# Patient Record
Sex: Female | Born: 1935 | Race: White | Hispanic: No | Smoking: Never smoker
Health system: Southern US, Community
[De-identification: ages and names within clinical notes are randomized; demographics above are authoritative.]

## PROBLEM LIST (undated history)

## (undated) DIAGNOSIS — I4891 Unspecified atrial fibrillation: Secondary | ICD-10-CM

## (undated) DIAGNOSIS — Z8673 Personal history of transient ischemic attack (TIA), and cerebral infarction without residual deficits: Secondary | ICD-10-CM

## (undated) DIAGNOSIS — N950 Postmenopausal bleeding: Secondary | ICD-10-CM

## (undated) DIAGNOSIS — C541 Malignant neoplasm of endometrium: Secondary | ICD-10-CM

## (undated) DIAGNOSIS — R238 Other skin changes: Secondary | ICD-10-CM

## (undated) DIAGNOSIS — M1990 Unspecified osteoarthritis, unspecified site: Secondary | ICD-10-CM

## (undated) DIAGNOSIS — C801 Malignant (primary) neoplasm, unspecified: Secondary | ICD-10-CM

## (undated) DIAGNOSIS — I2510 Atherosclerotic heart disease of native coronary artery without angina pectoris: Secondary | ICD-10-CM

## (undated) DIAGNOSIS — Z973 Presence of spectacles and contact lenses: Secondary | ICD-10-CM

## (undated) DIAGNOSIS — Z8619 Personal history of other infectious and parasitic diseases: Secondary | ICD-10-CM

## (undated) DIAGNOSIS — K219 Gastro-esophageal reflux disease without esophagitis: Secondary | ICD-10-CM

## (undated) DIAGNOSIS — I10 Essential (primary) hypertension: Secondary | ICD-10-CM

## (undated) DIAGNOSIS — H3530 Unspecified macular degeneration: Secondary | ICD-10-CM

## (undated) DIAGNOSIS — I4719 Other supraventricular tachycardia: Secondary | ICD-10-CM

## (undated) DIAGNOSIS — Z853 Personal history of malignant neoplasm of breast: Secondary | ICD-10-CM

## (undated) DIAGNOSIS — R9389 Abnormal findings on diagnostic imaging of other specified body structures: Secondary | ICD-10-CM

## (undated) DIAGNOSIS — Z9889 Other specified postprocedural states: Secondary | ICD-10-CM

## (undated) DIAGNOSIS — W19XXXA Unspecified fall, initial encounter: Secondary | ICD-10-CM

## (undated) DIAGNOSIS — I471 Supraventricular tachycardia: Secondary | ICD-10-CM

## (undated) DIAGNOSIS — H409 Unspecified glaucoma: Secondary | ICD-10-CM

## (undated) DIAGNOSIS — R233 Spontaneous ecchymoses: Secondary | ICD-10-CM

## (undated) DIAGNOSIS — R112 Nausea with vomiting, unspecified: Secondary | ICD-10-CM

## (undated) DIAGNOSIS — E785 Hyperlipidemia, unspecified: Secondary | ICD-10-CM

## (undated) HISTORY — DX: Essential (primary) hypertension: I10

## (undated) HISTORY — PX: DILATION AND CURETTAGE, DIAGNOSTIC / THERAPEUTIC: SUR384

## (undated) HISTORY — PX: OTHER SURGICAL HISTORY: SHX169

## (undated) HISTORY — DX: Unspecified fall, initial encounter: W19.XXXA

---
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
## 1992-01-19 HISTORY — PX: CATARACT EXTRACTION W/ INTRAOCULAR LENS  IMPLANT, BILATERAL: SHX1307

## 1996-01-19 DIAGNOSIS — C801 Malignant (primary) neoplasm, unspecified: Secondary | ICD-10-CM

## 1996-01-19 HISTORY — DX: Malignant (primary) neoplasm, unspecified: C80.1

## 1996-01-19 HISTORY — PX: MASTECTOMY: SHX3

## 2000-01-19 HISTORY — PX: KNEE ARTHROSCOPY: SUR90

## 2004-05-14 ENCOUNTER — Ambulatory Visit: Payer: Self-pay | Admitting: Oncology

## 2005-05-17 ENCOUNTER — Ambulatory Visit: Payer: Self-pay | Admitting: Oncology

## 2006-05-25 ENCOUNTER — Ambulatory Visit: Payer: Self-pay | Admitting: Oncology

## 2013-10-15 DIAGNOSIS — M172 Bilateral post-traumatic osteoarthritis of knee: Secondary | ICD-10-CM

## 2013-10-15 HISTORY — DX: Bilateral post-traumatic osteoarthritis of knee: M17.2

## 2014-10-21 DIAGNOSIS — I479 Paroxysmal tachycardia, unspecified: Secondary | ICD-10-CM | POA: Diagnosis not present

## 2014-10-24 DIAGNOSIS — I10 Essential (primary) hypertension: Secondary | ICD-10-CM

## 2014-10-24 DIAGNOSIS — R42 Dizziness and giddiness: Secondary | ICD-10-CM | POA: Diagnosis not present

## 2014-10-24 DIAGNOSIS — E785 Hyperlipidemia, unspecified: Secondary | ICD-10-CM | POA: Diagnosis not present

## 2014-10-24 HISTORY — DX: Essential (primary) hypertension: I10

## 2014-11-06 DIAGNOSIS — R55 Syncope and collapse: Secondary | ICD-10-CM | POA: Diagnosis not present

## 2014-11-06 DIAGNOSIS — E785 Hyperlipidemia, unspecified: Secondary | ICD-10-CM | POA: Diagnosis not present

## 2014-11-06 DIAGNOSIS — R42 Dizziness and giddiness: Secondary | ICD-10-CM | POA: Diagnosis not present

## 2014-11-06 DIAGNOSIS — I10 Essential (primary) hypertension: Secondary | ICD-10-CM | POA: Diagnosis not present

## 2014-11-11 DIAGNOSIS — R5383 Other fatigue: Secondary | ICD-10-CM | POA: Diagnosis not present

## 2014-11-11 DIAGNOSIS — R69 Illness, unspecified: Secondary | ICD-10-CM | POA: Diagnosis not present

## 2014-11-11 DIAGNOSIS — R42 Dizziness and giddiness: Secondary | ICD-10-CM | POA: Diagnosis not present

## 2014-11-11 DIAGNOSIS — R5381 Other malaise: Secondary | ICD-10-CM | POA: Diagnosis not present

## 2014-11-13 DIAGNOSIS — I10 Essential (primary) hypertension: Secondary | ICD-10-CM | POA: Diagnosis not present

## 2014-11-13 DIAGNOSIS — E785 Hyperlipidemia, unspecified: Secondary | ICD-10-CM | POA: Diagnosis not present

## 2014-11-13 DIAGNOSIS — R42 Dizziness and giddiness: Secondary | ICD-10-CM | POA: Diagnosis not present

## 2014-11-20 DIAGNOSIS — R109 Unspecified abdominal pain: Secondary | ICD-10-CM | POA: Diagnosis not present

## 2014-11-20 DIAGNOSIS — R140 Abdominal distension (gaseous): Secondary | ICD-10-CM | POA: Diagnosis not present

## 2014-11-20 DIAGNOSIS — R5383 Other fatigue: Secondary | ICD-10-CM | POA: Diagnosis not present

## 2014-11-25 DIAGNOSIS — M1711 Unilateral primary osteoarthritis, right knee: Secondary | ICD-10-CM | POA: Diagnosis not present

## 2014-12-24 DIAGNOSIS — E785 Hyperlipidemia, unspecified: Secondary | ICD-10-CM | POA: Diagnosis not present

## 2014-12-24 DIAGNOSIS — R42 Dizziness and giddiness: Secondary | ICD-10-CM | POA: Diagnosis not present

## 2014-12-24 DIAGNOSIS — I10 Essential (primary) hypertension: Secondary | ICD-10-CM | POA: Diagnosis not present

## 2015-02-27 DIAGNOSIS — H26491 Other secondary cataract, right eye: Secondary | ICD-10-CM | POA: Diagnosis not present

## 2015-02-27 DIAGNOSIS — H524 Presbyopia: Secondary | ICD-10-CM | POA: Diagnosis not present

## 2015-02-27 DIAGNOSIS — H5213 Myopia, bilateral: Secondary | ICD-10-CM | POA: Diagnosis not present

## 2015-02-27 DIAGNOSIS — H401131 Primary open-angle glaucoma, bilateral, mild stage: Secondary | ICD-10-CM | POA: Diagnosis not present

## 2015-03-12 DIAGNOSIS — I471 Supraventricular tachycardia: Secondary | ICD-10-CM | POA: Diagnosis not present

## 2015-03-12 DIAGNOSIS — Z853 Personal history of malignant neoplasm of breast: Secondary | ICD-10-CM | POA: Diagnosis not present

## 2015-03-12 DIAGNOSIS — R000 Tachycardia, unspecified: Secondary | ICD-10-CM | POA: Diagnosis not present

## 2015-03-12 DIAGNOSIS — Z7982 Long term (current) use of aspirin: Secondary | ICD-10-CM | POA: Diagnosis not present

## 2015-03-12 DIAGNOSIS — S060X0A Concussion without loss of consciousness, initial encounter: Secondary | ICD-10-CM | POA: Diagnosis not present

## 2015-03-12 DIAGNOSIS — W1839XA Other fall on same level, initial encounter: Secondary | ICD-10-CM | POA: Diagnosis not present

## 2015-03-12 DIAGNOSIS — R69 Illness, unspecified: Secondary | ICD-10-CM | POA: Diagnosis not present

## 2015-03-12 DIAGNOSIS — R05 Cough: Secondary | ICD-10-CM | POA: Diagnosis not present

## 2015-03-12 DIAGNOSIS — E860 Dehydration: Secondary | ICD-10-CM | POA: Diagnosis not present

## 2015-03-12 DIAGNOSIS — S0003XA Contusion of scalp, initial encounter: Secondary | ICD-10-CM | POA: Diagnosis not present

## 2015-03-12 DIAGNOSIS — Y9389 Activity, other specified: Secondary | ICD-10-CM | POA: Diagnosis not present

## 2015-03-12 DIAGNOSIS — E7800 Pure hypercholesterolemia, unspecified: Secondary | ICD-10-CM | POA: Diagnosis not present

## 2015-03-12 DIAGNOSIS — I160 Hypertensive urgency: Secondary | ICD-10-CM | POA: Diagnosis not present

## 2015-03-12 DIAGNOSIS — I4891 Unspecified atrial fibrillation: Secondary | ICD-10-CM | POA: Diagnosis not present

## 2015-03-12 DIAGNOSIS — I10 Essential (primary) hypertension: Secondary | ICD-10-CM | POA: Diagnosis not present

## 2015-03-12 DIAGNOSIS — I621 Nontraumatic extradural hemorrhage: Secondary | ICD-10-CM | POA: Diagnosis not present

## 2015-03-12 DIAGNOSIS — R0602 Shortness of breath: Secondary | ICD-10-CM | POA: Diagnosis not present

## 2015-03-12 DIAGNOSIS — S0990XA Unspecified injury of head, initial encounter: Secondary | ICD-10-CM | POA: Diagnosis not present

## 2015-03-12 DIAGNOSIS — I480 Paroxysmal atrial fibrillation: Secondary | ICD-10-CM | POA: Diagnosis not present

## 2015-03-12 DIAGNOSIS — R509 Fever, unspecified: Secondary | ICD-10-CM | POA: Diagnosis not present

## 2015-03-13 DIAGNOSIS — S060X0A Concussion without loss of consciousness, initial encounter: Secondary | ICD-10-CM | POA: Diagnosis not present

## 2015-03-13 DIAGNOSIS — R509 Fever, unspecified: Secondary | ICD-10-CM | POA: Diagnosis not present

## 2015-03-13 DIAGNOSIS — I471 Supraventricular tachycardia: Secondary | ICD-10-CM | POA: Diagnosis not present

## 2015-03-14 DIAGNOSIS — R0602 Shortness of breath: Secondary | ICD-10-CM | POA: Diagnosis not present

## 2015-03-14 DIAGNOSIS — I471 Supraventricular tachycardia: Secondary | ICD-10-CM | POA: Diagnosis not present

## 2015-03-14 DIAGNOSIS — I4891 Unspecified atrial fibrillation: Secondary | ICD-10-CM | POA: Diagnosis not present

## 2015-03-14 DIAGNOSIS — I10 Essential (primary) hypertension: Secondary | ICD-10-CM | POA: Diagnosis not present

## 2015-03-14 DIAGNOSIS — R000 Tachycardia, unspecified: Secondary | ICD-10-CM | POA: Diagnosis not present

## 2015-03-14 DIAGNOSIS — Z853 Personal history of malignant neoplasm of breast: Secondary | ICD-10-CM | POA: Diagnosis not present

## 2015-03-14 DIAGNOSIS — E860 Dehydration: Secondary | ICD-10-CM | POA: Diagnosis not present

## 2015-03-14 DIAGNOSIS — W1839XA Other fall on same level, initial encounter: Secondary | ICD-10-CM | POA: Diagnosis not present

## 2015-03-14 DIAGNOSIS — Y9389 Activity, other specified: Secondary | ICD-10-CM | POA: Diagnosis not present

## 2015-03-14 DIAGNOSIS — R69 Illness, unspecified: Secondary | ICD-10-CM | POA: Diagnosis not present

## 2015-03-14 DIAGNOSIS — S0003XA Contusion of scalp, initial encounter: Secondary | ICD-10-CM | POA: Diagnosis not present

## 2015-03-14 DIAGNOSIS — S060X0A Concussion without loss of consciousness, initial encounter: Secondary | ICD-10-CM | POA: Diagnosis not present

## 2015-03-15 DIAGNOSIS — R05 Cough: Secondary | ICD-10-CM | POA: Diagnosis not present

## 2015-03-15 DIAGNOSIS — I471 Supraventricular tachycardia: Secondary | ICD-10-CM | POA: Diagnosis not present

## 2015-03-21 DIAGNOSIS — L538 Other specified erythematous conditions: Secondary | ICD-10-CM | POA: Diagnosis not present

## 2015-03-21 DIAGNOSIS — R928 Other abnormal and inconclusive findings on diagnostic imaging of breast: Secondary | ICD-10-CM | POA: Diagnosis not present

## 2015-03-21 DIAGNOSIS — N63 Unspecified lump in breast: Secondary | ICD-10-CM | POA: Diagnosis not present

## 2015-03-21 DIAGNOSIS — N6489 Other specified disorders of breast: Secondary | ICD-10-CM | POA: Diagnosis not present

## 2015-03-25 DIAGNOSIS — T148 Other injury of unspecified body region: Secondary | ICD-10-CM | POA: Diagnosis not present

## 2015-03-25 DIAGNOSIS — Z1389 Encounter for screening for other disorder: Secondary | ICD-10-CM | POA: Diagnosis not present

## 2015-03-25 DIAGNOSIS — Z9181 History of falling: Secondary | ICD-10-CM | POA: Diagnosis not present

## 2015-03-25 DIAGNOSIS — I4891 Unspecified atrial fibrillation: Secondary | ICD-10-CM | POA: Diagnosis not present

## 2015-03-25 DIAGNOSIS — S060X9A Concussion with loss of consciousness of unspecified duration, initial encounter: Secondary | ICD-10-CM | POA: Diagnosis not present

## 2015-03-25 DIAGNOSIS — J310 Chronic rhinitis: Secondary | ICD-10-CM | POA: Diagnosis not present

## 2015-04-01 DIAGNOSIS — I10 Essential (primary) hypertension: Secondary | ICD-10-CM | POA: Diagnosis not present

## 2015-04-01 DIAGNOSIS — E785 Hyperlipidemia, unspecified: Secondary | ICD-10-CM | POA: Diagnosis not present

## 2015-04-02 DIAGNOSIS — E785 Hyperlipidemia, unspecified: Secondary | ICD-10-CM | POA: Diagnosis not present

## 2015-04-02 DIAGNOSIS — I10 Essential (primary) hypertension: Secondary | ICD-10-CM | POA: Diagnosis not present

## 2015-04-14 DIAGNOSIS — M1731 Unilateral post-traumatic osteoarthritis, right knee: Secondary | ICD-10-CM | POA: Diagnosis not present

## 2015-04-23 DIAGNOSIS — Z853 Personal history of malignant neoplasm of breast: Secondary | ICD-10-CM

## 2015-04-23 DIAGNOSIS — N189 Chronic kidney disease, unspecified: Secondary | ICD-10-CM | POA: Diagnosis not present

## 2015-04-23 DIAGNOSIS — I10 Essential (primary) hypertension: Secondary | ICD-10-CM

## 2015-04-28 DIAGNOSIS — I10 Essential (primary) hypertension: Secondary | ICD-10-CM | POA: Diagnosis not present

## 2015-04-28 DIAGNOSIS — M810 Age-related osteoporosis without current pathological fracture: Secondary | ICD-10-CM | POA: Diagnosis not present

## 2015-04-28 DIAGNOSIS — I471 Supraventricular tachycardia: Secondary | ICD-10-CM | POA: Diagnosis not present

## 2015-04-28 DIAGNOSIS — K219 Gastro-esophageal reflux disease without esophagitis: Secondary | ICD-10-CM | POA: Diagnosis not present

## 2015-04-28 DIAGNOSIS — I4891 Unspecified atrial fibrillation: Secondary | ICD-10-CM | POA: Diagnosis not present

## 2015-04-28 DIAGNOSIS — E785 Hyperlipidemia, unspecified: Secondary | ICD-10-CM | POA: Diagnosis not present

## 2015-07-28 DIAGNOSIS — Z1231 Encounter for screening mammogram for malignant neoplasm of breast: Secondary | ICD-10-CM | POA: Diagnosis not present

## 2015-08-04 DIAGNOSIS — Z853 Personal history of malignant neoplasm of breast: Secondary | ICD-10-CM | POA: Diagnosis not present

## 2015-08-04 DIAGNOSIS — I10 Essential (primary) hypertension: Secondary | ICD-10-CM | POA: Diagnosis not present

## 2015-08-07 DIAGNOSIS — G8929 Other chronic pain: Secondary | ICD-10-CM | POA: Diagnosis not present

## 2015-08-07 DIAGNOSIS — M25561 Pain in right knee: Secondary | ICD-10-CM | POA: Diagnosis not present

## 2015-08-07 DIAGNOSIS — M1711 Unilateral primary osteoarthritis, right knee: Secondary | ICD-10-CM | POA: Diagnosis not present

## 2015-08-28 DIAGNOSIS — I10 Essential (primary) hypertension: Secondary | ICD-10-CM | POA: Diagnosis not present

## 2015-08-28 DIAGNOSIS — Z0000 Encounter for general adult medical examination without abnormal findings: Secondary | ICD-10-CM | POA: Diagnosis not present

## 2015-08-28 DIAGNOSIS — K219 Gastro-esophageal reflux disease without esophagitis: Secondary | ICD-10-CM | POA: Diagnosis not present

## 2015-08-28 DIAGNOSIS — D539 Nutritional anemia, unspecified: Secondary | ICD-10-CM | POA: Diagnosis not present

## 2015-08-28 DIAGNOSIS — R5381 Other malaise: Secondary | ICD-10-CM | POA: Diagnosis not present

## 2015-09-17 DIAGNOSIS — H26491 Other secondary cataract, right eye: Secondary | ICD-10-CM | POA: Diagnosis not present

## 2015-09-17 DIAGNOSIS — H5213 Myopia, bilateral: Secondary | ICD-10-CM | POA: Diagnosis not present

## 2015-09-17 DIAGNOSIS — H401131 Primary open-angle glaucoma, bilateral, mild stage: Secondary | ICD-10-CM | POA: Diagnosis not present

## 2015-09-17 DIAGNOSIS — H524 Presbyopia: Secondary | ICD-10-CM | POA: Diagnosis not present

## 2015-10-07 DIAGNOSIS — N3001 Acute cystitis with hematuria: Secondary | ICD-10-CM | POA: Diagnosis not present

## 2015-10-07 DIAGNOSIS — N3091 Cystitis, unspecified with hematuria: Secondary | ICD-10-CM | POA: Diagnosis not present

## 2015-10-14 DIAGNOSIS — N898 Other specified noninflammatory disorders of vagina: Secondary | ICD-10-CM | POA: Diagnosis not present

## 2015-10-14 DIAGNOSIS — Z23 Encounter for immunization: Secondary | ICD-10-CM | POA: Diagnosis not present

## 2015-10-14 DIAGNOSIS — M545 Low back pain: Secondary | ICD-10-CM | POA: Diagnosis not present

## 2015-10-20 DIAGNOSIS — N950 Postmenopausal bleeding: Secondary | ICD-10-CM | POA: Diagnosis not present

## 2015-10-24 DIAGNOSIS — R938 Abnormal findings on diagnostic imaging of other specified body structures: Secondary | ICD-10-CM | POA: Diagnosis not present

## 2015-10-24 DIAGNOSIS — N950 Postmenopausal bleeding: Secondary | ICD-10-CM | POA: Diagnosis not present

## 2015-10-28 DIAGNOSIS — I2510 Atherosclerotic heart disease of native coronary artery without angina pectoris: Secondary | ICD-10-CM | POA: Diagnosis not present

## 2015-10-28 DIAGNOSIS — Z01810 Encounter for preprocedural cardiovascular examination: Secondary | ICD-10-CM | POA: Diagnosis not present

## 2015-10-28 DIAGNOSIS — E785 Hyperlipidemia, unspecified: Secondary | ICD-10-CM | POA: Diagnosis not present

## 2015-10-28 DIAGNOSIS — I471 Supraventricular tachycardia: Secondary | ICD-10-CM | POA: Diagnosis not present

## 2015-10-28 DIAGNOSIS — I10 Essential (primary) hypertension: Secondary | ICD-10-CM | POA: Diagnosis not present

## 2015-10-28 DIAGNOSIS — R42 Dizziness and giddiness: Secondary | ICD-10-CM | POA: Diagnosis not present

## 2015-11-10 ENCOUNTER — Encounter: Payer: Self-pay | Admitting: Gynecologic Oncology

## 2015-11-10 ENCOUNTER — Ambulatory Visit: Payer: Medicare HMO | Attending: Gynecologic Oncology | Admitting: Gynecologic Oncology

## 2015-11-10 DIAGNOSIS — R938 Abnormal findings on diagnostic imaging of other specified body structures: Secondary | ICD-10-CM

## 2015-11-10 DIAGNOSIS — N950 Postmenopausal bleeding: Secondary | ICD-10-CM | POA: Insufficient documentation

## 2015-11-10 DIAGNOSIS — Z79899 Other long term (current) drug therapy: Secondary | ICD-10-CM | POA: Diagnosis not present

## 2015-11-10 DIAGNOSIS — N895 Stricture and atresia of vagina: Secondary | ICD-10-CM | POA: Diagnosis not present

## 2015-11-10 DIAGNOSIS — R9389 Abnormal findings on diagnostic imaging of other specified body structures: Secondary | ICD-10-CM | POA: Insufficient documentation

## 2015-11-10 DIAGNOSIS — Z7902 Long term (current) use of antithrombotics/antiplatelets: Secondary | ICD-10-CM | POA: Insufficient documentation

## 2015-11-10 HISTORY — DX: Postmenopausal bleeding: N95.0

## 2015-11-10 NOTE — Patient Instructions (Signed)
Plan to have a dilation and curettage of the uterus on November 20, 2015 at the [facility]  You will receive a phone call from the pre-surgical RN several days before your procedure.    STOP TAKING YOUR PLAVIX NOW.  Please call our office for any questions or concerns.

## 2015-11-10 NOTE — Progress Notes (Signed)
Consult Note: Gyn-Onc  Consult was requested by Dr. Berenice Primas for the evaluation of Sheri Hensley 82 y.o. female  CC:  Chief Complaint  Patient presents with  . PMB    New Consultation  . Thickened stripe  . Vaginal stenosis    Assessment/Plan:  Sheri Hensley  is a 82 y.o.  year old with a thickened endometrium and post-menopausal bleeding with intolerance to exam in the office.  I believe she is a good candidate for D&C in the operating room. MAC sedation will likely be adequate and therefore I do not think that she will require preop cardiac clearance prior to this procedure. I discussed that based on her exam findings there is a 20% risk for underlying malignancy.  If this is diagnosed on D&C she will require either a hysterectomy or conservative therapy (progestins/radiation) pending her preference.  I discussed the risks of D&C including perforation, infection, bleeding. I discussed bleeding risk is higher because she is on Plavix.  I recommend she hold the plavix prior to this procedure.   HPI: Sheri Hensley is a 82 year old woman (G1P1) who is seen in consultation at the request of Dr Berenice Primas due to post-menopausal bleeding.  The patient has a history of postmenopausal bleeding since early September 2017. It was initially light spotting, then became slightly more persistent over time.  She was seen by Dr Berenice Primas on 10/20/15 where a TVUS was performed and demonstrated a uterus measuring 6.25x5.2x4.19cm but with a thickened endometrium of 2cm. The notes document that an office biopsy was not possible due to narrowed vagina, an Dr Berenice Primas did not think she would be able to accomplish a D&C in the operating room.  The patient is relatively healthy for her advanced age. She has a history of an arrythmia for which she takes amiodarone. She also takes Plavix for a presumed TIA. She otherwise has a medical history significant for a prior  appendectomy and 1 prior vaginal delivery.    Current Meds:  Outpatient Encounter Prescriptions as of 11/10/2015  Medication Sig  . ALPRAZolam (XANAX) 0.25 MG tablet Take 0.25 mg by mouth daily as needed for anxiety.  Marland Kitchen amiodarone (PACERONE) 200 MG tablet Take 100 mg by mouth daily.  . brimonidine (ALPHAGAN P) 0.1 % SOLN   . clopidogrel (PLAVIX) 75 MG tablet Take 75 mg by mouth.  . cyanocobalamin (V-R VITAMIN B-12) 500 MCG tablet Take by mouth.  Marland Kitchen lisinopril (PRINIVIL,ZESTRIL) 10 MG tablet Take 10 mg by mouth.  . Melatonin 10 MG CAPS Take by mouth.  . metoprolol tartrate (LOPRESSOR) 25 MG tablet Take 25 mg by mouth.  . metoprolol tartrate (LOPRESSOR) 25 MG tablet Take 25 mg by mouth.  . Multiple Vitamins-Minerals (CENTRUM VITAMINTS PO) Take 1 tablet by mouth every morning.  Marland Kitchen omeprazole (PRILOSEC) 20 MG capsule Take 20 mg by mouth every morning.  . simvastatin (ZOCOR) 20 MG tablet Take 20 mg by mouth.   No facility-administered encounter medications on file as of 11/10/2015.     Allergy:  Allergies  Allergen Reactions  . Metronidazole Rash    Social Hx:   Social History   Social History  . Marital status: Widowed    Spouse name: N/A  . Number of children: N/A  . Years of education: N/A   Occupational History  . Not on file.   Social History Main Topics  . Smoking status: Never Smoker  . Smokeless tobacco: Never Used  . Alcohol use Yes  Comment: occ  . Drug use: No  . Sexual activity: Not on file   Other Topics Concern  . Not on file   Social History Narrative  . No narrative on file    Past Surgical Hx:  Past Surgical History:  Procedure Laterality Date  . APPENDECTOMY  1954    Past Medical Hx:  Past Medical History:  Diagnosis Date  . Allergy   . Arthritis   . Breast cancer ([place]) 1998   Left breast removed  . Cataract 1994   Both eyes  . Hypertension     Past Gynecological History:  G1 (SVD) No LMP recorded.  Family Hx:  Family History   Problem Relation Age of Onset  . Hypertension Mother   . Hypertension Father     Review of Systems:  Constitutional  Feels well,    ENT Normal appearing ears and nares bilaterally Skin/Breast  No rash, sores, jaundice, itching, dryness Cardiovascular  No chest pain, shortness of breath, or edema  Pulmonary  No cough or wheeze.  Gastro Intestinal  No nausea, vomitting, or diarrhoea. No bright red blood per rectum, no abdominal pain, change in bowel movement, or constipation.  Genito Urinary  No frequency, urgency, dysuria, + postmenopausal bleeding Musculo Skeletal  No myalgia, arthralgia, joint swelling or pain  Neurologic  No weakness, numbness, change in gait,  Psychology  No depression, anxiety, insomnia.   Vitals:  Blood pressure (!) 182/80, pulse 61, temperature 97.2 F (36.2 C), temperature source Oral, resp. rate 18, height 5\' 3"  (1.6 m), weight 136 lb 1.6 oz (61.7 kg), SpO2 100 %.  Physical Exam: WD in NAD Neck  Supple NROM, without any enlargements.  Lymph Node Survey No cervical supraclavicular or inguinal adenopathy Cardiovascular  Pulse normal rate, regularity and rhythm. S1 and S2 normal.  Lungs  Clear to auscultation bilateraly, without wheezes/crackles/rhonchi. Good air movement.  Skin  No rash/lesions/breakdown  Psychiatry  Alert and oriented to person, place, and time  Abdomen  Normoactive bowel sounds, abdomen soft, non-tender and thin without evidence of hernia.  Back No CVA tenderness Genito Urinary  Vulva/vagina: Normal external female genitalia.  No lesions. No discharge or bleeding.  Bladder/urethra:  No lesions or masses, well supported bladder  Vagina: narrow but permits pederson speculum and a normal appearing cervix is seen clearly. The vaginal walls are smooth and atrophic and free of lesions.  Cervix: Normal appearing, no lesions. Stenotic - unsuccessful attempt at office biopsy  Uterus: Small, mobile, no parametrial involvement or  nodularity.  Adnexa: no palpable masses. Rectal  deferred Extremities  No bilateral cyanosis, clubbing or edema.   Sheri Eva, MD  11/10/2015, 2:29 PM

## 2015-11-11 DIAGNOSIS — E784 Other hyperlipidemia: Secondary | ICD-10-CM | POA: Diagnosis not present

## 2015-11-11 DIAGNOSIS — I2510 Atherosclerotic heart disease of native coronary artery without angina pectoris: Secondary | ICD-10-CM | POA: Diagnosis not present

## 2015-11-11 DIAGNOSIS — Z79899 Other long term (current) drug therapy: Secondary | ICD-10-CM | POA: Diagnosis not present

## 2015-11-17 ENCOUNTER — Encounter (HOSPITAL_BASED_OUTPATIENT_CLINIC_OR_DEPARTMENT_OTHER): Payer: Self-pay | Admitting: *Deleted

## 2015-11-18 ENCOUNTER — Encounter (HOSPITAL_BASED_OUTPATIENT_CLINIC_OR_DEPARTMENT_OTHER): Payer: Self-pay | Admitting: *Deleted

## 2015-11-18 NOTE — Progress Notes (Addendum)
NPO AFTER MN.  ARRIVE AT 0930.  NEEDS ISTAT .  CURRENT EKG TO BE FAXED FROM DR Salley Scarlet ([facility]).  WILL TAKE AMIORODONE, METOPROLOL, AND PRILOSEC AM DOS W/ SIPS OF WATER.   ADDENDIUM:  EKG FROM [facility] DONE FEB 2017.  POSSIBLE REPEAT EKG, ASK MDA DOS.

## 2015-11-20 ENCOUNTER — Other Ambulatory Visit: Payer: Self-pay

## 2015-11-20 ENCOUNTER — Ambulatory Visit (HOSPITAL_BASED_OUTPATIENT_CLINIC_OR_DEPARTMENT_OTHER)
Admission: RE | Admit: 2015-11-20 | Discharge: 2015-11-20 | Disposition: A | Discharge status: Home / Self Care | Payer: Medicare HMO | Source: Ambulatory Visit | Attending: Gynecologic Oncology | Admitting: Gynecologic Oncology

## 2015-11-20 ENCOUNTER — Encounter (HOSPITAL_BASED_OUTPATIENT_CLINIC_OR_DEPARTMENT_OTHER)
Admission: RE | Disposition: A | Discharge status: Home / Self Care | Payer: Self-pay | Source: Ambulatory Visit | Attending: Gynecologic Oncology

## 2015-11-20 ENCOUNTER — Ambulatory Visit (HOSPITAL_BASED_OUTPATIENT_CLINIC_OR_DEPARTMENT_OTHER): Payer: Medicare HMO | Admitting: Anesthesiology

## 2015-11-20 ENCOUNTER — Encounter (HOSPITAL_BASED_OUTPATIENT_CLINIC_OR_DEPARTMENT_OTHER): Payer: Self-pay | Admitting: *Deleted

## 2015-11-20 DIAGNOSIS — R938 Abnormal findings on diagnostic imaging of other specified body structures: Secondary | ICD-10-CM

## 2015-11-20 DIAGNOSIS — N950 Postmenopausal bleeding: Secondary | ICD-10-CM | POA: Insufficient documentation

## 2015-11-20 DIAGNOSIS — R9389 Abnormal findings on diagnostic imaging of other specified body structures: Secondary | ICD-10-CM | POA: Diagnosis present

## 2015-11-20 DIAGNOSIS — I2510 Atherosclerotic heart disease of native coronary artery without angina pectoris: Secondary | ICD-10-CM | POA: Insufficient documentation

## 2015-11-20 DIAGNOSIS — Z853 Personal history of malignant neoplasm of breast: Secondary | ICD-10-CM | POA: Diagnosis not present

## 2015-11-20 DIAGNOSIS — I10 Essential (primary) hypertension: Secondary | ICD-10-CM | POA: Diagnosis not present

## 2015-11-20 DIAGNOSIS — M1990 Unspecified osteoarthritis, unspecified site: Secondary | ICD-10-CM | POA: Diagnosis not present

## 2015-11-20 DIAGNOSIS — Z79899 Other long term (current) drug therapy: Secondary | ICD-10-CM | POA: Diagnosis not present

## 2015-11-20 DIAGNOSIS — Z7902 Long term (current) use of antithrombotics/antiplatelets: Secondary | ICD-10-CM | POA: Insufficient documentation

## 2015-11-20 DIAGNOSIS — N895 Stricture and atresia of vagina: Secondary | ICD-10-CM | POA: Insufficient documentation

## 2015-11-20 DIAGNOSIS — K219 Gastro-esophageal reflux disease without esophagitis: Secondary | ICD-10-CM | POA: Insufficient documentation

## 2015-11-20 DIAGNOSIS — C541 Malignant neoplasm of endometrium: Secondary | ICD-10-CM | POA: Diagnosis not present

## 2015-11-20 HISTORY — DX: Abnormal findings on diagnostic imaging of other specified body structures: R93.89

## 2015-11-20 HISTORY — DX: Personal history of malignant neoplasm of breast: Z85.3

## 2015-11-20 HISTORY — DX: Personal history of other infectious and parasitic diseases: Z86.19

## 2015-11-20 HISTORY — DX: Atherosclerotic heart disease of native coronary artery without angina pectoris: I25.10

## 2015-11-20 HISTORY — DX: Other supraventricular tachycardia: I47.19

## 2015-11-20 HISTORY — PX: DILATION AND CURETTAGE OF UTERUS: SHX78

## 2015-11-20 HISTORY — DX: Postmenopausal bleeding: N95.0

## 2015-11-20 HISTORY — DX: Hyperlipidemia, unspecified: E78.5

## 2015-11-20 HISTORY — DX: Presence of spectacles and contact lenses: Z97.3

## 2015-11-20 HISTORY — DX: Unspecified osteoarthritis, unspecified site: M19.90

## 2015-11-20 HISTORY — DX: Personal history of transient ischemic attack (TIA), and cerebral infarction without residual deficits: Z86.73

## 2015-11-20 HISTORY — DX: Gastro-esophageal reflux disease without esophagitis: K21.9

## 2015-11-20 HISTORY — DX: Supraventricular tachycardia: I47.1

## 2015-11-20 HISTORY — DX: Unspecified macular degeneration: H35.30

## 2015-11-20 HISTORY — DX: Unspecified glaucoma: H40.9

## 2015-11-20 LAB — POCT I-STAT, CHEM 8
BUN: 18 mg/dL (ref 6–20)
Calcium, Ion: 1.26 mmol/L (ref 1.15–1.40)
Chloride: 99 mmol/L — ABNORMAL LOW (ref 101–111)
Creatinine, Ser: 1 mg/dL (ref 0.44–1.00)
Glucose, Bld: 102 mg/dL — ABNORMAL HIGH (ref 65–99)
HEMATOCRIT: 39 % (ref 36.0–46.0)
HEMOGLOBIN: 13.3 g/dL (ref 12.0–15.0)
Potassium: 4.4 mmol/L (ref 3.5–5.1)
SODIUM: 140 mmol/L (ref 135–145)
TCO2: 27 mmol/L (ref 0–100)

## 2015-11-20 SURGERY — DILATION AND CURETTAGE
Anesthesia: General | Site: Uterus

## 2015-11-20 MED ORDER — PROPOFOL 10 MG/ML IV BOLUS
INTRAVENOUS | Status: DC | PRN
  Administered 2015-11-20: 30 mg via INTRAVENOUS
  Administered 2015-11-20: 70 mg via INTRAVENOUS
  Administered 2015-11-20: 50 mg via INTRAVENOUS

## 2015-11-20 MED ORDER — LIDOCAINE HCL (CARDIAC) 20 MG/ML IV SOLN
INTRAVENOUS | Status: DC | PRN
  Administered 2015-11-20: 60 mg via INTRAVENOUS

## 2015-11-20 MED ORDER — LACTATED RINGERS IV SOLN
INTRAVENOUS | Status: DC
  Administered 2015-11-20: 10:00:00 via INTRAVENOUS
  Filled 2015-11-20: qty 1000

## 2015-11-20 MED ORDER — FENTANYL CITRATE (PF) 100 MCG/2ML IJ SOLN
INTRAMUSCULAR | Status: DC | PRN
  Administered 2015-11-20 (×2): 25 ug via INTRAVENOUS

## 2015-11-20 MED ORDER — LIDOCAINE 2% (20 MG/ML) 5 ML SYRINGE
INTRAMUSCULAR | Status: AC
  Filled 2015-11-20: qty 5

## 2015-11-20 MED ORDER — PROPOFOL 10 MG/ML IV BOLUS
INTRAVENOUS | Status: AC
  Filled 2015-11-20: qty 20

## 2015-11-20 MED ORDER — LIDOCAINE-EPINEPHRINE 1 %-1:100000 IJ SOLN
INTRAMUSCULAR | Status: DC | PRN
  Administered 2015-11-20: 8 mL

## 2015-11-20 MED ORDER — ONDANSETRON HCL 4 MG/2ML IJ SOLN
INTRAMUSCULAR | Status: DC | PRN
  Administered 2015-11-20: 4 mg via INTRAVENOUS

## 2015-11-20 MED ORDER — 0.9 % SODIUM CHLORIDE (POUR BTL) OPTIME
TOPICAL | Status: DC | PRN
  Administered 2015-11-20: 500 mL

## 2015-11-20 MED ORDER — FENTANYL CITRATE (PF) 100 MCG/2ML IJ SOLN
INTRAMUSCULAR | Status: AC
  Filled 2015-11-20: qty 2

## 2015-11-20 MED ORDER — LIDOCAINE-EPINEPHRINE 1 %-1:100000 IJ SOLN
INTRAMUSCULAR | Status: AC
  Filled 2015-11-20: qty 1

## 2015-11-20 MED ORDER — LIDOCAINE-EPINEPHRINE (PF) 1 %-1:200000 IJ SOLN
INTRAMUSCULAR | Status: AC
  Filled 2015-11-20: qty 30

## 2015-11-20 SURGICAL SUPPLY — 24 items
CATH ROBINSON RED A/P 16FR (CATHETERS) ×2 IMPLANT
COVER TABLE BACK 60X90 (DRAPES) ×2 IMPLANT
DILATOR CANAL MILEX (MISCELLANEOUS) ×2 IMPLANT
DRAPE SHEET LG 3/4 BI-LAMINATE (DRAPES) ×2 IMPLANT
DRAPE UNDERBUTTOCKS STRL (DRAPE) ×2 IMPLANT
DRSG TELFA 3X8 NADH (GAUZE/BANDAGES/DRESSINGS) ×2 IMPLANT
GLOVE BIO SURGEON STRL SZ 6 (GLOVE) ×4 IMPLANT
GOWN STRL REUS W/ TWL LRG LVL3 (GOWN DISPOSABLE) ×1 IMPLANT
GOWN STRL REUS W/TWL LRG LVL3 (GOWN DISPOSABLE) ×1
GOWN STRL REUS W/TWL LRG LVL4 (GOWN DISPOSABLE) ×2 IMPLANT
KIT ROOM TURNOVER WOR (KITS) ×2 IMPLANT
LEGGING LITHOTOMY PAIR STRL (DRAPES) ×2 IMPLANT
MANIFOLD NEPTUNE II (INSTRUMENTS) IMPLANT
NEEDLE SPNL 22GX3.5 QUINCKE BK (NEEDLE) IMPLANT
PACK BASIN DAY SURGERY FS (CUSTOM PROCEDURE TRAY) ×2 IMPLANT
PAD OB MATERNITY 4.3X12.25 (PERSONAL CARE ITEMS) ×2 IMPLANT
PAD PREP 24X48 CUFFED NSTRL (MISCELLANEOUS) ×2 IMPLANT
SUT VIC AB 0 CT1 36 (SUTURE) ×2 IMPLANT
SUT VIC AB 2-0 UR6 27 (SUTURE) IMPLANT
SYR CONTROL 10ML LL (SYRINGE) ×2 IMPLANT
TOWEL OR 17X24 6PK STRL BLUE (TOWEL DISPOSABLE) ×2 IMPLANT
TRAY DSU PREP LF (CUSTOM PROCEDURE TRAY) ×2 IMPLANT
TUBE CONNECTING 12X1/4 (SUCTIONS) IMPLANT
WATER STERILE IRR 500ML POUR (IV SOLUTION) ×2 IMPLANT

## 2015-11-20 NOTE — Anesthesia Preprocedure Evaluation (Addendum)
Anesthesia Evaluation  Patient identified by MRN, date of birth, ID band Patient awake    Reviewed: reviewed documented beta blocker date and time   Airway Mallampati: II  TM Distance: >3 FB     Dental  (+) Implants, Teeth Intact, Dental Advisory Given,    Pulmonary neg pulmonary ROS,    breath sounds clear to auscultation       Cardiovascular hypertension, Pt. on medications and Pt. on home beta blockers + CAD   Rhythm:Regular Rate:Normal     Neuro/Psych negative neurological ROS     GI/Hepatic Neg liver ROS, GERD  Medicated and Controlled,  Endo/Other  negative endocrine ROS  Renal/GU negative Renal ROS     Musculoskeletal  (+) Arthritis , Osteoarthritis,    Abdominal   Peds  Hematology negative hematology ROS (+)   Anesthesia Other Findings   Reproductive/Obstetrics                           Lab Results  Component Value Date   HGB 13.3 11/20/2015   HCT 39.0 11/20/2015   Lab Results  Component Value Date   CREATININE 1.00 11/20/2015   BUN 18 11/20/2015   NA 140 11/20/2015   K 4.4 11/20/2015   CL 99 (L) 11/20/2015   No results found for: INR, PROTIME  EKG: sinus bradycardia.  Anesthesia Physical Anesthesia Plan  ASA: II  Anesthesia Plan: General   Post-op Pain Management:    Induction: Intravenous  Airway Management Planned: LMA  Additional Equipment:   Intra-op Plan:   Post-operative Plan: Extubation in OR  Informed Consent: I have reviewed the patients History and Physical, chart, labs and discussed the procedure including the risks, benefits and alternatives for the proposed anesthesia with the patient or authorized representative who has indicated his/her understanding and acceptance.   Dental advisory given  Plan Discussed with: CRNA  Anesthesia Plan Comments:         Anesthesia Quick Evaluation

## 2015-11-20 NOTE — Transfer of Care (Signed)
Immediate Anesthesia Transfer of Care Note  Patient: Sheri Hensley  Procedure(s) Performed: Procedure(s): DILATATION AND CURETTAGE (N/A)  Patient Location: PACU  Anesthesia Type:General  Level of Consciousness: awake, alert , oriented and patient cooperative  Airway & Oxygen Therapy: Patient Spontanous Breathing and Patient connected to nasal cannula oxygen  Post-op Assessment: Report given to RN and Post -op Vital signs reviewed and stable  Post vital signs: Reviewed and stable  Last Vitals:  Vitals:   11/20/15 0933  BP: (!) 169/72  Resp: 20  Temp: 36.5 C    Last Pain:  Vitals:   11/20/15 0933  TempSrc: Oral      Patients Stated Pain Goal: 5 ([id number])  Complications: No apparent anesthesia complications

## 2015-11-20 NOTE — Anesthesia Preprocedure Evaluation (Signed)
Anesthesia Evaluation  Patient identified by MRN, date of birth, ID band Patient awake    Reviewed: Allergy & Precautions, NPO status , Patient's Chart, lab work & pertinent test results  Airway Mallampati: II  TM Distance: >3 FB Neck ROM: Full    Dental  (+) Teeth Intact, Dental Advisory Given   Pulmonary neg pulmonary ROS,    breath sounds clear to auscultation       Cardiovascular hypertension, + CAD   Rhythm:Regular Rate:Normal     Neuro/Psych negative neurological ROS  negative psych ROS   GI/Hepatic Neg liver ROS, GERD  ,  Endo/Other  negative endocrine ROS  Renal/GU negative Renal ROS     Musculoskeletal  (+) Arthritis , Osteoarthritis,    Abdominal   Peds  Hematology negative hematology ROS (+)   Anesthesia Other Findings - HLD  Reproductive/Obstetrics negative OB ROS                             Anesthesia Physical Anesthesia Plan  ASA: II  Anesthesia Plan: General   Post-op Pain Management:    Induction: Intravenous  Airway Management Planned: LMA  Additional Equipment:   Intra-op Plan:   Post-operative Plan: Extubation in OR  Informed Consent: I have reviewed the patients History and Physical, chart, labs and discussed the procedure including the risks, benefits and alternatives for the proposed anesthesia with the patient or authorized representative who has indicated his/her understanding and acceptance.   Dental advisory given  Plan Discussed with: CRNA  Anesthesia Plan Comments:         Anesthesia Quick Evaluation

## 2015-11-20 NOTE — Anesthesia Procedure Notes (Signed)
Procedure Name: LMA Insertion Date/Time: 11/20/2015 12:38 PM Performed by: Suella Broad D Pre-anesthesia Checklist: Patient identified, Timeout performed, Emergency Drugs available, Suction available and Patient being monitored Patient Re-evaluated:Patient Re-evaluated prior to inductionOxygen Delivery Method: Circle system utilized Preoxygenation: Pre-oxygenation with 100% oxygen Intubation Type: IV induction Ventilation: Mask ventilation without difficulty LMA: LMA inserted LMA Size: 4.0 Number of attempts: 1 Placement Confirmation: positive ETCO2 and breath sounds checked- equal and bilateral Tube secured with: Tape

## 2015-11-20 NOTE — H&P (View-Only) (Signed)
Consult Note: Gyn-Onc  Consult was requested by Dr. Berenice Primas for the evaluation of Sheri Hensley 82 y.o. female  CC:  Chief Complaint  Patient presents with  . PMB    New Consultation  . Thickened stripe  . Vaginal stenosis    Assessment/Plan:  Sheri Hensley  is a 82 y.o.  year old with a thickened endometrium and post-menopausal bleeding with intolerance to exam in the office.  I believe she is a good candidate for D&C in the operating room. MAC sedation will likely be adequate and therefore I do not think that she will require preop cardiac clearance prior to this procedure. I discussed that based on her exam findings there is a 20% risk for underlying malignancy.  If this is diagnosed on D&C she will require either a hysterectomy or conservative therapy (progestins/radiation) pending her preference.  I discussed the risks of D&C including perforation, infection, bleeding. I discussed bleeding risk is higher because she is on Plavix.  I recommend she hold the plavix prior to this procedure.   HPI: Sheri Hensley is a 82 year old woman (G1P1) who is seen in consultation at the request of Dr Berenice Primas due to post-menopausal bleeding.  The patient has a history of postmenopausal bleeding since early September 2017. It was initially light spotting, then became slightly more persistent over time.  She was seen by Dr Berenice Primas on 10/20/15 where a TVUS was performed and demonstrated a uterus measuring 6.25x5.2x4.19cm but with a thickened endometrium of 2cm. The notes document that an office biopsy was not possible due to narrowed vagina, an Dr Berenice Primas did not think she would be able to accomplish a D&C in the operating room.  The patient is relatively healthy for her advanced age. She has a history of an arrythmia for which she takes amiodarone. She also takes Plavix for a presumed TIA. She otherwise has a medical history significant for a prior  appendectomy and 1 prior vaginal delivery.    Current Meds:  Outpatient Encounter Prescriptions as of 11/10/2015  Medication Sig  . ALPRAZolam (XANAX) 0.25 MG tablet Take 0.25 mg by mouth daily as needed for anxiety.  Marland Kitchen amiodarone (PACERONE) 200 MG tablet Take 100 mg by mouth daily.  . brimonidine (ALPHAGAN P) 0.1 % SOLN   . clopidogrel (PLAVIX) 75 MG tablet Take 75 mg by mouth.  . cyanocobalamin (V-R VITAMIN B-12) 500 MCG tablet Take by mouth.  Marland Kitchen lisinopril (PRINIVIL,ZESTRIL) 10 MG tablet Take 10 mg by mouth.  . Melatonin 10 MG CAPS Take by mouth.  . metoprolol tartrate (LOPRESSOR) 25 MG tablet Take 25 mg by mouth.  . metoprolol tartrate (LOPRESSOR) 25 MG tablet Take 25 mg by mouth.  . Multiple Vitamins-Minerals (CENTRUM VITAMINTS PO) Take 1 tablet by mouth every morning.  Marland Kitchen omeprazole (PRILOSEC) 20 MG capsule Take 20 mg by mouth every morning.  . simvastatin (ZOCOR) 20 MG tablet Take 20 mg by mouth.   No facility-administered encounter medications on file as of 11/10/2015.     Allergy:  Allergies  Allergen Reactions  . Metronidazole Rash    Social Hx:   Social History   Social History  . Marital status: Widowed    Spouse name: N/A  . Number of children: N/A  . Years of education: N/A   Occupational History  . Not on file.   Social History Main Topics  . Smoking status: Never Smoker  . Smokeless tobacco: Never Used  . Alcohol use Yes  Comment: occ  . Drug use: No  . Sexual activity: Not on file   Other Topics Concern  . Not on file   Social History Narrative  . No narrative on file    Past Surgical Hx:  Past Surgical History:  Procedure Laterality Date  . APPENDECTOMY  1954    Past Medical Hx:  Past Medical History:  Diagnosis Date  . Allergy   . Arthritis   . Breast cancer ([place]) 1998   Left breast removed  . Cataract 1994   Both eyes  . Hypertension     Past Gynecological History:  G1 (SVD) No LMP recorded.  Family Hx:  Family History   Problem Relation Age of Onset  . Hypertension Mother   . Hypertension Father     Review of Systems:  Constitutional  Feels well,    ENT Normal appearing ears and nares bilaterally Skin/Breast  No rash, sores, jaundice, itching, dryness Cardiovascular  No chest pain, shortness of breath, or edema  Pulmonary  No cough or wheeze.  Gastro Intestinal  No nausea, vomitting, or diarrhoea. No bright red blood per rectum, no abdominal pain, change in bowel movement, or constipation.  Genito Urinary  No frequency, urgency, dysuria, + postmenopausal bleeding Musculo Skeletal  No myalgia, arthralgia, joint swelling or pain  Neurologic  No weakness, numbness, change in gait,  Psychology  No depression, anxiety, insomnia.   Vitals:  Blood pressure (!) 182/80, pulse 61, temperature 97.2 F (36.2 C), temperature source Oral, resp. rate 18, height 5\' 3"  (1.6 m), weight 136 lb 1.6 oz (61.7 kg), SpO2 100 %.  Physical Exam: WD in NAD Neck  Supple NROM, without any enlargements.  Lymph Node Survey No cervical supraclavicular or inguinal adenopathy Cardiovascular  Pulse normal rate, regularity and rhythm. S1 and S2 normal.  Lungs  Clear to auscultation bilateraly, without wheezes/crackles/rhonchi. Good air movement.  Skin  No rash/lesions/breakdown  Psychiatry  Alert and oriented to person, place, and time  Abdomen  Normoactive bowel sounds, abdomen soft, non-tender and thin without evidence of hernia.  Back No CVA tenderness Genito Urinary  Vulva/vagina: Normal external female genitalia.  No lesions. No discharge or bleeding.  Bladder/urethra:  No lesions or masses, well supported bladder  Vagina: narrow but permits pederson speculum and a normal appearing cervix is seen clearly. The vaginal walls are smooth and atrophic and free of lesions.  Cervix: Normal appearing, no lesions. Stenotic - unsuccessful attempt at office biopsy  Uterus: Small, mobile, no parametrial involvement or  nodularity.  Adnexa: no palpable masses. Rectal  deferred Extremities  No bilateral cyanosis, clubbing or edema.   [name], MD  11/10/2015, 2:29 PM

## 2015-11-20 NOTE — Interval H&P Note (Signed)
History and Physical Interval Note:  11/20/2015 10:40 AM  Sheri Hensley  has presented today for surgery, with the diagnosis of post menopausal bleeding, thicken endometrum  The various methods of treatment have been discussed with the patient and family. After consideration of risks, benefits and other options for treatment, the patient has consented to  Procedure(s): DILATATION AND CURETTAGE (N/A) as a surgical intervention .  The patient's history has been reviewed, patient examined, no change in status, stable for surgery.  I have reviewed the patient's chart and labs.  Questions were answered to the patient's satisfaction.     [name]

## 2015-11-20 NOTE — Discharge Instructions (Signed)
Post Anesthesia Home Care Instructions  Activity: Get plenty of rest for the remainder of the day. A responsible adult should stay with you for 24 hours following the procedure.  For the next 24 hours, DO NOT: -Drive a car -Paediatric nurse -Drink alcoholic beverages -Take any medication unless instructed by your physician -Make any legal decisions or sign important papers.  Meals: Start with liquid foods such as gelatin or soup. Progress to regular foods as tolerated. Avoid greasy, spicy, heavy foods. If nausea and/or vomiting occur, drink only clear liquids until the nausea and/or vomiting subsides. Call your physician if vomiting continues.  Special Instructions/Symptoms: Your throat may feel dry or sore from the anesthesia or the breathing tube placed in your throat during surgery. If this causes discomfort, gargle with warm salt water. The discomfort should disappear within 24 hours.  If you had a scopolamine patch placed behind your ear for the management of post- operative nausea and/or vomiting:  1. The medication in the patch is effective for 72 hours, after which it should be removed.  Wrap patch in a tissue and discard in the trash. Wash hands thoroughly with soap and water. 2. You may remove the patch earlier than 72 hours if you experience unpleasant side effects which may include dry mouth, dizziness or visual disturbances. 3. Avoid touching the patch. Wash your hands with soap and water after contact with the patch.   Postoperative Anesthesia Instructions-Pediatric  Activity: Your child should rest for the remainder of the day. A responsible adult should stay with your child for 24 hours.  Meals: Your child should start with liquids and light foods such as gelatin or soup unless otherwise instructed by the physician. Progress to regular foods as tolerated. Avoid spicy, greasy, and heavy foods. If nausea and/or vomiting occur, drink only clear liquids such as apple juice  or Pedialyte until the nausea and/or vomiting subsides. Call your physician if vomiting continues.  Special Instructions/Symptoms: Your child may be drowsy for the rest of the day, although some children experience some hyperactivity a few hours after the surgery. Your child may also experience some irritability or crying episodes due to the operative procedure and/or anesthesia. Your child's throat may feel dry or sore from the anesthesia or the breathing tube placed in the throat during surgery. Use throat lozenges, sprays, or ice chips if needed. D&C After Care  ACTIVITY  Rest as much as possible the day after discharge.  No restrictions on heavy lifting   Do not drive a car for 24 hours  Increase activity gradually.  You may exercise   NUTRITION  You may resume your normal diet.  Drink 6 to 8 glasses of fluids a day.  Eat a healthy, balanced diet including portions of food from the meat (protein), milk, fruit, vegetable, and bread groups.   ELIMINATION   If constipation occurs, drink more liquids, and add more fruits, vegetables, and bran to your diet. You may take a mild laxative, such as Milk of Magnesia, Metamucil, or a stool softener such as Colace, with permission from your caregiver.  HYGIENE  You may shower or bath and wash your hair.  HOME CARE INSTRUCTIONS   Follow your caregiver's instructions about medicines, activity, and follow-up appointments after surgery.  Do not drink alcohol while taking pain medicine.  You may take over-the-counter medicine for pain, recommended by your caregiver.  If your pain is not relieved with medicine, call your caregiver.  Do not douche or use tampons (use  a nonperfumed sanitary pad).  Do not have sexual intercourse until your caregiver gives you permission (typically 6 weeks postoperatively). Hugging, kissing, and playful sexual activity is fine with your caregiver's permission.  You may take a mild medicine for  constipation, recommended by your caregiver. Bran foods and drinking a lot of fluids will help with constipation.  Make sure your family understands everything about your operation and recovery.  SEEK MEDICAL CARE IF:    You notice a foul smell or discharge.  You have painful or bloody urination.  You develop nausea and vomiting.  You develop diarrhea.  You develop a rash.  You have a reaction or allergy from the medicine.  You feel dizzy or light-headed.  You need stronger pain medicine. SEEK IMMEDIATE MEDICAL CARE IF:   You develop a temperature of 102 F (38.9 C) or higher.  You pass out.  You develop leg or chest pain.  You develop abdominal pain.  You develop shortness of breath.  You develop bleeding from the wound area.  MAKE SURE YOU:   Understand these instructions.  Will watch your condition.  Will get help right away if you are not doing well or get worse.  Contact Dr Denman George at # [phone]. After hours this line will connect to the after-hours-nurse line which will contact the doctor on call.  Document Released: 08/19/2003 Document Revised: 05/21/2013 Document Reviewed: 12/06/2008 [facility] Patient Information 2015 [place], [place]. This information is not intended to replace advice given to you by your health care provider. Make sure you discuss any questions you have with your health care provider.

## 2015-11-20 NOTE — Op Note (Signed)
PATIENT: Francis Poinsett DATE: 11/20/15   Preop Diagnosis: postmenopausal bleeding, thickened endometrium  Postoperative Diagnosis: same  Surgery: D&C  Surgeons:  [name], MD Assistant: none  Anesthesia: General   Estimated blood loss: <66ml  IVF:  176ml   Urine output: 123XX123 ml   Complications: None   Pathology: endometrial curettings   Operative findings: tightly stenotic external os, bulky uterus filled with old blood and moderate volume tissue. Uterus sounded to 8cm  Procedure: The patient was identified in the preoperative holding area. Informed consent was signed on the chart. Patient was seen history was reviewed and exam was performed.   The patient was then taken to the operating room and placed in the supine position with SCD hose on. General anesthesia was then induced without difficulty. She was then placed in the dorsolithotomy position. The perineum was prepped with Betadine. The vagina was prepped with Betadine. The patient was then draped after the prep was dried. An in and out catheterization to empty the bladder was performed under sterile conditions.  Timeout was performed the patient, procedure, antibiotic, allergy, and length of procedure.   The weighted speculum was placed in the posterior vagina. The right angle retractor was placed anteriorally to visualize the cervix. The cervix was grasped anterior and posteriorally with a tenaculum. A paracervical block was made with 8cc of lidocaine 1% with epi. A lacrimal duct dilator was used to penetrate the external os. An atraumatic os dilator was used to progressively dilate the os. A sound was placed and the fundus appreciated at 8cm (slightly retroverted). The cervix was progressively dilated using pratts dilators. The curette was passed sucessively around the uterine cavity and a moderate amount of polypoid tissue was extracted and old blood (hematometrium).  The vagina was  irrigated.  All instrument, suture, laparotomy, Ray-Tec, and needle counts were correct x2. The patient tolerated the procedure well and was taken recovery room in stable condition. This is Everitt Amber dictating an operative note on Jennah Hilliker.

## 2015-11-20 NOTE — Anesthesia Postprocedure Evaluation (Signed)
Anesthesia Post Note  Patient: Sheri Hensley  Procedure(s) Performed: Procedure(s) (LRB): DILATATION AND CURETTAGE (N/A)  Patient location during evaluation: PACU Anesthesia Type: General Level of consciousness: awake and alert Pain management: pain level controlled Vital Signs Assessment: post-procedure vital signs reviewed and stable Respiratory status: spontaneous breathing, nonlabored ventilation, respiratory function stable and patient connected to nasal cannula oxygen Cardiovascular status: blood pressure returned to baseline and stable Postop Assessment: no signs of nausea or vomiting Anesthetic complications: no    Last Vitals:  Vitals:   11/20/15 1330 11/20/15 1345  BP: (!) 192/84 (!) 184/66  Pulse: 63 (!) 58  Resp: (!) 22 13  Temp:      Last Pain:  Vitals:   11/20/15 0933  TempSrc: Oral                 Effie Berkshire

## 2015-11-21 ENCOUNTER — Telehealth: Payer: Self-pay | Admitting: Gynecologic Oncology

## 2015-11-21 ENCOUNTER — Encounter (HOSPITAL_BASED_OUTPATIENT_CLINIC_OR_DEPARTMENT_OTHER): Payer: Self-pay | Admitting: Gynecologic Oncology

## 2015-11-21 DIAGNOSIS — C541 Malignant neoplasm of endometrium: Secondary | ICD-10-CM

## 2015-11-21 NOTE — Telephone Encounter (Signed)
Informed patient of finding of grade 2 endometrial cancer on D&C specimen.  Recommendation is for hysterectomy.   I discussed that I felt that she was a good candidate, despite her advanced age, for hysterectomy. I recommend that she stay off her plavix until after this next surgery.  I also recommend that she see her cardiologist prior to her next surgery to ensure she is optimized for surgery.  We will schedule her for a follow-up appointment with me this month and schedule her for a robotic hysterectomy, BSO, SLN biopsy.  [name], MD

## 2015-11-25 ENCOUNTER — Telehealth: Payer: Self-pay

## 2015-11-25 NOTE — Telephone Encounter (Signed)
Orders received from [facility], APNP to contact the patient to update with Dr Terrence Dupont Rossi's recommendations to come in for a Pre-op follow up appointment on November 27,2017 at 3:15 PM . Contacted the patient's daughter Sheri Hensley , updated Sheri Hensley with the pre-op follow up appointment and that a date for her mother's scheduled surgery is December 23, 2015 . Sheri Hensley states understanding ,denies further questions at this time.

## 2015-11-25 NOTE — Progress Notes (Signed)
Pt is being scheduled for preop appt; please place surgical orders in epic. Thanks.  

## 2015-11-27 DIAGNOSIS — R69 Illness, unspecified: Secondary | ICD-10-CM | POA: Diagnosis not present

## 2015-11-27 DIAGNOSIS — C541 Malignant neoplasm of endometrium: Secondary | ICD-10-CM | POA: Diagnosis not present

## 2015-11-27 DIAGNOSIS — Z8673 Personal history of transient ischemic attack (TIA), and cerebral infarction without residual deficits: Secondary | ICD-10-CM | POA: Diagnosis not present

## 2015-12-01 DIAGNOSIS — M1731 Unilateral post-traumatic osteoarthritis, right knee: Secondary | ICD-10-CM | POA: Diagnosis not present

## 2015-12-01 DIAGNOSIS — G8929 Other chronic pain: Secondary | ICD-10-CM | POA: Diagnosis not present

## 2015-12-02 DIAGNOSIS — Z01810 Encounter for preprocedural cardiovascular examination: Secondary | ICD-10-CM | POA: Diagnosis not present

## 2015-12-10 NOTE — Patient Instructions (Addendum)
Sheri Hensley  12/10/2015   Your procedure is scheduled on: 12/23/2015    Report to [facility]  Entrance take [place]  elevators to 3rd floor to  [place] at   [place] AM.  Call this number if you have problems the morning of surgery [phone]   Remember: ONLY 1 PERSON MAY GO WITH YOU TO SHORT STAY TO GET  READY MORNING OF YOUR SURGERY.  Do not eat food or drink liquids :After Midnight.             Eat a light diet the day before surgery.  Examples include: toast, soups, broths, yogurt and mashed potatoes.  Things to avoid include: carbonated beverages, raw fruits and vegetables and beans.      Take these medicines the morning of surgery with A SIP OF WATER: Xanax if needed, amiodarone ( Pacerone), Alphaghan eye drops, Claritin if needed, Omeprazole, Sertraline ( Zoloft ), Metoprolol Tartrate                                You may not have any metal on your body including hair pins and              piercings  Do not wear jewelry, make-up, lotions, powders or perfumes, deodorant             Do not wear nail polish.  Do not shave  48 hours prior to surgery.                 Do not bring valuables to the hospital. [place].  Contacts, dentures or bridgework may not be worn into surgery.  Leave suitcase in the car. After surgery it may be brought to your room.       Special Instructions: coughing and deep breathing exercises, leg exercises               Please read over the following fact sheets you were given: _____________________________________________________________________                _____________________________________________________________________  [facility] - Preparing for Surgery Before surgery, you can play an important role.  Because skin is not sterile, your skin needs to be as free of germs as possible.  You can reduce the number of germs on your skin by washing  with CHG (chlorahexidine gluconate) soap before surgery.  CHG is an antiseptic cleaner which kills germs and bonds with the skin to continue killing germs even after washing. Please DO NOT use if you have an allergy to CHG or antibacterial soaps.  If your skin becomes reddened/irritated stop using the CHG and inform your nurse when you arrive at Short Stay. Do not shave (including legs and underarms) for at least 48 hours prior to the first CHG shower.  You may shave your face/neck. Please follow these instructions carefully:  1.  Shower with CHG Soap the night before surgery and the  morning of Surgery.  2.  If you choose to wash your hair, wash your hair first as usual with your  normal  shampoo.  3.  After you shampoo, rinse your hair and body thoroughly to remove the  shampoo.  4.  Use CHG as you would any other liquid soap.  You can apply chg directly  to the skin and wash                       Gently with a scrungie or clean washcloth.  5.  Apply the CHG Soap to your body ONLY FROM THE NECK DOWN.   Do not use on face/ open                           Wound or open sores. Avoid contact with eyes, ears mouth and genitals (private parts).                       Wash face,  Genitals (private parts) with your normal soap.             6.  Wash thoroughly, paying special attention to the area where your surgery  will be performed.  7.  Thoroughly rinse your body with warm water from the neck down.  8.  DO NOT shower/wash with your normal soap after using and rinsing off  the CHG Soap.                9.  Pat yourself dry with a clean towel.            10.  Wear clean pajamas.            11.  Place clean sheets on your bed the night of your first shower and do not  sleep with pets. Day of Surgery : Do not apply any lotions/deodorants the morning of surgery.  Please wear clean clothes to the hospital/surgery center.  FAILURE TO FOLLOW THESE INSTRUCTIONS MAY RESULT IN THE  CANCELLATION OF YOUR SURGERY PATIENT SIGNATURE_________________________________  NURSE SIGNATURE__________________________________  ________________________________________________________________________  WHAT IS A BLOOD TRANSFUSION? Blood Transfusion Information  A transfusion is the replacement of blood or some of its parts. Blood is made up of multiple cells which provide different functions.  Red blood cells carry oxygen and are used for blood loss replacement.  White blood cells fight against infection.  Platelets control bleeding.  Plasma helps clot blood.  Other blood products are available for specialized needs, such as hemophilia or other clotting disorders. BEFORE THE TRANSFUSION  Who gives blood for transfusions?   Healthy volunteers who are fully evaluated to make sure their blood is safe. This is blood bank blood. Transfusion therapy is the safest it has ever been in the practice of medicine. Before blood is taken from a donor, a complete history is taken to make sure that person has no history of diseases nor engages in risky social behavior (examples are intravenous drug use or sexual activity with multiple partners). The donor's travel history is screened to minimize risk of transmitting infections, such as malaria. The donated blood is tested for signs of infectious diseases, such as HIV and hepatitis. The blood is then tested to be sure it is compatible with you in order to minimize the chance of a transfusion reaction. If you or a relative donates blood, this is often done in anticipation of surgery and is not appropriate for emergency situations. It takes many days to process the donated blood. RISKS AND COMPLICATIONS Although transfusion therapy is very safe and saves many lives, the main dangers of transfusion include:   Getting an infectious disease.  Developing a transfusion reaction. This  is an allergic reaction to something in the blood you were given. Every  precaution is taken to prevent this. The decision to have a blood transfusion has been considered carefully by your caregiver before blood is given. Blood is not given unless the benefits outweigh the risks. AFTER THE TRANSFUSION  Right after receiving a blood transfusion, you will usually feel much better and more energetic. This is especially true if your red blood cells have gotten low (anemic). The transfusion raises the level of the red blood cells which carry oxygen, and this usually causes an energy increase.  The nurse administering the transfusion will monitor you carefully for complications. HOME CARE INSTRUCTIONS  No special instructions are needed after a transfusion. You may find your energy is better. Speak with your caregiver about any limitations on activity for underlying diseases you may have. SEEK MEDICAL CARE IF:   Your condition is not improving after your transfusion.  You develop redness or irritation at the intravenous (IV) site. SEEK IMMEDIATE MEDICAL CARE IF:  Any of the following symptoms occur over the next 12 hours:  Shaking chills.  You have a temperature by mouth above 102 F (38.9 C), not controlled by medicine.  Chest, back, or muscle pain.  People around you feel you are not acting correctly or are confused.  Shortness of breath or difficulty breathing.  Dizziness and fainting.  You get a rash or develop hives.  You have a decrease in urine output.  Your urine turns a dark color or changes to pink, red, or brown. Any of the following symptoms occur over the next 10 days:  You have a temperature by mouth above 102 F (38.9 C), not controlled by medicine.  Shortness of breath.  Weakness after normal activity.  The white part of the eye turns yellow (jaundice).  You have a decrease in the amount of urine or are urinating less often.  Your urine turns a dark color or changes to pink, red, or brown. Document Released: 01/02/2000 Document  Revised: 03/29/2011 Document Reviewed: 08/21/2007 ExitCare Patient Information 2014 [place].  _______________________________________________________________________  Incentive Spirometer  An incentive spirometer is a tool that can help keep your lungs clear and active. This tool measures how well you are filling your lungs with each breath. Taking long deep breaths may help reverse or decrease the chance of developing breathing (pulmonary) problems (especially infection) following:  A long period of time when you are unable to move or be active. BEFORE THE PROCEDURE   If the spirometer includes an indicator to show your best effort, your nurse or respiratory therapist will set it to a desired goal.  If possible, sit up straight or lean slightly forward. Try not to slouch.  Hold the incentive spirometer in an upright position. INSTRUCTIONS FOR USE  1. Sit on the edge of your bed if possible, or sit up as far as you can in bed or on a chair. 2. Hold the incentive spirometer in an upright position. 3. Breathe out normally. 4. Place the mouthpiece in your mouth and seal your lips tightly around it. 5. Breathe in slowly and as deeply as possible, raising the piston or the ball toward the top of the column. 6. Hold your breath for 3-5 seconds or for as long as possible. Allow the piston or ball to fall to the bottom of the column. 7. Remove the mouthpiece from your mouth and breathe out normally. 8. Rest for a few seconds and repeat Steps 1  through 7 at least 10 times every 1-2 hours when you are awake. Take your time and take a few normal breaths between deep breaths. 9. The spirometer may include an indicator to show your best effort. Use the indicator as a goal to work toward during each repetition. 10. After each set of 10 deep breaths, practice coughing to be sure your lungs are clear. If you have an incision (the cut made at the time of surgery), support your incision when  coughing by placing a pillow or rolled up towels firmly against it. Once you are able to get out of bed, walk around indoors and cough well. You may stop using the incentive spirometer when instructed by your caregiver.  RISKS AND COMPLICATIONS  Take your time so you do not get dizzy or light-headed.  If you are in pain, you may need to take or ask for pain medication before doing incentive spirometry. It is harder to take a deep breath if you are having pain. AFTER USE  Rest and breathe slowly and easily.  It can be helpful to keep track of a log of your progress. Your caregiver can provide you with a simple table to help with this. If you are using the spirometer at home, follow these instructions: [place] IF:   You are having difficultly using the spirometer.  You have trouble using the spirometer as often as instructed.  Your pain medication is not giving enough relief while using the spirometer.  You develop fever of 100.5 F (38.1 C) or higher. SEEK IMMEDIATE MEDICAL CARE IF:   You cough up bloody sputum that had not been present before.  You develop fever of 102 F (38.9 C) or greater.  You develop worsening pain at or near the incision site. MAKE SURE YOU:   Understand these instructions.  Will watch your condition.  Will get help right away if you are not doing well or get worse. Document Released: 05/17/2006 Document Revised: 03/29/2011 Document Reviewed: 07/18/2006 [facility] Patient Information 2014 [place], [place].   ________________________________________________________________________

## 2015-12-15 ENCOUNTER — Ambulatory Visit: Payer: Medicare HMO | Attending: Gynecologic Oncology | Admitting: Gynecologic Oncology

## 2015-12-15 ENCOUNTER — Encounter: Payer: Self-pay | Admitting: Gynecologic Oncology

## 2015-12-15 ENCOUNTER — Ambulatory Visit (HOSPITAL_COMMUNITY)
Admission: RE | Admit: 2015-12-15 | Discharge: 2015-12-15 | Disposition: A | Discharge status: Home / Self Care | Payer: Medicare HMO | Source: Ambulatory Visit | Attending: Gynecologic Oncology | Admitting: Gynecologic Oncology

## 2015-12-15 ENCOUNTER — Encounter (HOSPITAL_COMMUNITY)
Admission: RE | Admit: 2015-12-15 | Discharge: 2015-12-15 | Disposition: A | Discharge status: Home / Self Care | Payer: Medicare HMO | Source: Ambulatory Visit | Attending: Gynecologic Oncology | Admitting: Gynecologic Oncology

## 2015-12-15 ENCOUNTER — Encounter (HOSPITAL_COMMUNITY): Payer: Self-pay

## 2015-12-15 VITALS — BP 152/63 | HR 57 | Temp 98.8°F | Resp 18 | Ht 64.0 in | Wt 133.2 lb

## 2015-12-15 DIAGNOSIS — N950 Postmenopausal bleeding: Secondary | ICD-10-CM | POA: Insufficient documentation

## 2015-12-15 DIAGNOSIS — C541 Malignant neoplasm of endometrium: Secondary | ICD-10-CM | POA: Insufficient documentation

## 2015-12-15 DIAGNOSIS — I499 Cardiac arrhythmia, unspecified: Secondary | ICD-10-CM | POA: Insufficient documentation

## 2015-12-15 DIAGNOSIS — Z7902 Long term (current) use of antithrombotics/antiplatelets: Secondary | ICD-10-CM | POA: Insufficient documentation

## 2015-12-15 DIAGNOSIS — Z01818 Encounter for other preprocedural examination: Secondary | ICD-10-CM | POA: Diagnosis not present

## 2015-12-15 DIAGNOSIS — Z79899 Other long term (current) drug therapy: Secondary | ICD-10-CM | POA: Insufficient documentation

## 2015-12-15 DIAGNOSIS — R938 Abnormal findings on diagnostic imaging of other specified body structures: Secondary | ICD-10-CM | POA: Diagnosis not present

## 2015-12-15 DIAGNOSIS — I700 Atherosclerosis of aorta: Secondary | ICD-10-CM | POA: Insufficient documentation

## 2015-12-15 HISTORY — DX: Nausea with vomiting, unspecified: R11.2

## 2015-12-15 HISTORY — DX: Other specified postprocedural states: Z98.890

## 2015-12-15 HISTORY — DX: Malignant (primary) neoplasm, unspecified: C80.1

## 2015-12-15 HISTORY — DX: Malignant neoplasm of endometrium: C54.1

## 2015-12-15 LAB — URINALYSIS, ROUTINE W REFLEX MICROSCOPIC
BILIRUBIN URINE: NEGATIVE
GLUCOSE, UA: NEGATIVE mg/dL
Hgb urine dipstick: NEGATIVE
KETONES UR: NEGATIVE mg/dL
LEUKOCYTES UA: NEGATIVE
NITRITE: NEGATIVE
PROTEIN: NEGATIVE mg/dL
Specific Gravity, Urine: 1.021 (ref 1.005–1.030)
pH: 6 (ref 5.0–8.0)

## 2015-12-15 LAB — CBC WITH DIFFERENTIAL/PLATELET
BASOS PCT: 0 %
Basophils Absolute: 0 10*3/uL (ref 0.0–0.1)
EOS ABS: 0.1 10*3/uL (ref 0.0–0.7)
Eosinophils Relative: 1 %
HCT: 40.2 % (ref 36.0–46.0)
HEMOGLOBIN: 13.1 g/dL (ref 12.0–15.0)
Lymphocytes Relative: 17 %
Lymphs Abs: 1.8 10*3/uL (ref 0.7–4.0)
MCH: 30.3 pg (ref 26.0–34.0)
MCHC: 32.6 g/dL (ref 30.0–36.0)
MCV: 92.8 fL (ref 78.0–100.0)
MONO ABS: 0.6 10*3/uL (ref 0.1–1.0)
MONOS PCT: 5 %
NEUTROS PCT: 77 %
Neutro Abs: 8.1 10*3/uL — ABNORMAL HIGH (ref 1.7–7.7)
PLATELETS: 179 10*3/uL (ref 150–400)
RBC: 4.33 MIL/uL (ref 3.87–5.11)
RDW: 14.1 % (ref 11.5–15.5)
WBC: 10.6 10*3/uL — ABNORMAL HIGH (ref 4.0–10.5)

## 2015-12-15 LAB — COMPREHENSIVE METABOLIC PANEL
ALBUMIN: 4.7 g/dL (ref 3.5–5.0)
ALT: 27 U/L (ref 14–54)
AST: 28 U/L (ref 15–41)
Alkaline Phosphatase: 64 U/L (ref 38–126)
Anion gap: 7 (ref 5–15)
BUN: 22 mg/dL — ABNORMAL HIGH (ref 6–20)
CO2: 28 mmol/L (ref 22–32)
Calcium: 9.6 mg/dL (ref 8.9–10.3)
Chloride: 102 mmol/L (ref 101–111)
Creatinine, Ser: 0.97 mg/dL (ref 0.44–1.00)
GFR calc Af Amer: 56 mL/min — ABNORMAL LOW (ref >60)
GFR calc non Af Amer: 48 mL/min — ABNORMAL LOW (ref >60)
GLUCOSE: 85 mg/dL (ref 65–99)
POTASSIUM: 4.6 mmol/L (ref 3.5–5.1)
SODIUM: 137 mmol/L (ref 135–145)
Total Bilirubin: 1.1 mg/dL (ref 0.3–1.2)
Total Protein: 7.4 g/dL (ref 6.5–8.1)

## 2015-12-15 LAB — ABO/RH: ABO/RH(D): O NEG

## 2015-12-15 NOTE — Progress Notes (Signed)
Lov/ cardiac clearance 10-28-15 on chart dr Geraldo Pitter Stress test 12-02-15 stress test dr Agustin Cree on chart

## 2015-12-15 NOTE — Progress Notes (Signed)
Follow-up Note: Gyn-Onc  Consult was initially requested by Dr. Berenice Primas for the evaluation of Sheri Hensley 82 y.o. female  CC:  Chief Complaint  Patient presents with  . endometrial cancer    Assessment/Plan:  Sheri Hensley  is a 82 y.o.  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  A detailed discussion was held with the patient and her family with regard to to her endometrial cancer diagnosis. We discussed the standard management options for uterine cancer which includes surgery followed possibly by adjuvant therapy depending on the results of surgery. The options for surgical management include a hysterectomy and removal of the tubes and ovaries possibly with removal of pelvic and para-aortic lymph nodes.If feasible, a minimally invasive approach including a robotic hysterectomy or laparoscopic hysterectomy have benefits including shorter hospital stay, recovery time and better wound healing than with open surgery. The patient has been counseled about these surgical options and the risks of surgery in general including infection, bleeding, damage to surrounding structures (including bowel, bladder, ureters, nerves or vessels), and the postoperative risks of PE/ DVT, and lymphedema. I extensively reviewed the additional risks of robotic hysterectomy including possible need for conversion to open laparotomy.  I discussed positioning during surgery of trendelenberg and risks of minor facial swelling and care we take in preoperative positioning.  After counseling and consideration of her options, she desires to proceed with robotic assisted total hysterectomy with bilateral sapingo-oophorectomy and SLN biopsy.   I discussed that recovery may be slower in patients of advanced age, however there is no evidence that endometrial cancer staging with minimally invasive techniques is associated with higher complication risks in older women.  I discussed that due  to her advanced age and grade 2 histology, we would be recommending at least vaginal brachytherapy to reduce risk for recurrence postoperatively, but might also potentially recommend additional adjuvant therapies.  She will be seen by anesthesia for preoperative clearance and discussion of postoperative pain management.  She was given the opportunity to ask questions, which were answered to her satisfaction, and she is agreement with the above mentioned plan of care.  She received preoperative cardiac "clearance" from her cardiologist (Dr Lovette Cliche) on 10/28/15 including stress test and was deemed that she is not at high risk for coronary events during the procedure.  She has been holding her plavix for approximately 1 month and will restart it or a baby aspirin postop.  HPI: Sheri Hensley is a 82 year old woman (G1P1) who is seen in consultation at the request of Dr Berenice Primas due to post-menopausal bleeding.  The patient has a history of postmenopausal bleeding since early September 2017. It was initially light spotting, then became slightly more persistent over time.  She was seen by Dr Berenice Primas on 10/20/15 where a TVUS was performed and demonstrated a uterus measuring 6.25x5.2x4.19cm but with a thickened endometrium of 2cm. The notes document that an office biopsy was not possible due to narrowed vagina, an Dr Berenice Primas did not think she would be able to accomplish a D&C in the operating room.  The patient is relatively healthy for her advanced age. She has a history of an arrythmia for which she takes amiodarone. She also takes Plavix for a presumed TIA. She otherwise has a medical history significant for a prior appendectomy and 1 prior vaginal delivery.   Interval Hx:  On 11/20/15 she underwent D&C and pathology revealed grade 2 endomettrioid endometrial adenocarcinoma.   Current Meds:  Outpatient Encounter Prescriptions as of 12/15/2015  Medication Sig  . acetaminophen  (TYLENOL) 500 MG tablet Take 500 mg by mouth every 6 (six) hours as needed for moderate pain or headache.  . ALPRAZolam (XANAX) 0.25 MG tablet Take 0.125-0.25 mg by mouth daily as needed for anxiety.   Marland Kitchen amiodarone (PACERONE) 200 MG tablet Take 100 mg by mouth every morning.   Marland Kitchen atorvastatin (LIPITOR) 10 MG tablet Take 10 mg by mouth at bedtime.  . bimatoprost (LUMIGAN) 0.01 % SOLN Place 1 drop into both eyes at bedtime.  . brimonidine (ALPHAGAN P) 0.1 % SOLN Place 1 drop into both eyes every 8 (eight) hours.   . cyanocobalamin (V-R VITAMIN B-12) 500 MCG tablet Take 500 mcg by mouth daily.   Marland Kitchen lisinopril (PRINIVIL,ZESTRIL) 10 MG tablet Take 10 mg by mouth every morning.   . Melatonin 10 MG CAPS Take 10 mg by mouth at bedtime.   . metoprolol tartrate (LOPRESSOR) 25 MG tablet Take 25 mg by mouth 2 (two) times daily.   . Multiple Vitamins-Minerals (CENTRUM VITAMINTS PO) Take 1 tablet by mouth every morning.  . Multiple Vitamins-Minerals (ICAPS AREDS 2) CAPS Take 1 tablet by mouth daily.  Marland Kitchen omeprazole (PRILOSEC) 20 MG capsule Take 20 mg by mouth every morning.  . sertraline (ZOLOFT) 50 MG tablet Take 50 mg by mouth daily.  . betamethasone acetate-betamethasone sodium phosphate (CELESTONE) 6 (3-3) MG/ML injection Inject 12 mg into the articular space every 3 (three) months.  . loratadine (CLARITIN) 10 MG tablet Take 10 mg by mouth daily as needed for allergies.   No facility-administered encounter medications on file as of 12/15/2015.     Allergy:  Allergies  Allergen Reactions  . Flagyl [Metronidazole] Rash    Social Hx:   Social History   Social History  . Marital status: Widowed    Spouse name: N/A  . Number of children: N/A  . Years of education: N/A   Occupational History  . Not on file.   Social History Main Topics  . Smoking status: Never Smoker  . Smokeless tobacco: Never Used  . Alcohol use Yes     Comment: occasional  . Drug use: No  . Sexual activity: Not on file    Other Topics Concern  . Not on file   Social History Narrative  . No narrative on file    Past Surgical Hx:  Past Surgical History:  Procedure Laterality Date  . APPENDECTOMY  1954  . CATARACT EXTRACTION W/ INTRAOCULAR LENS  IMPLANT, BILATERAL  1994  . colonscopy    . DILATION AND CURETTAGE OF UTERUS N/A 11/20/2015   Procedure: DILATATION AND CURETTAGE;  Surgeon: Sheri Amber, MD;  Location: [facility];  Service: Gynecology;  Laterality: N/A;  . KNEE ARTHROSCOPY Right 2002  . MASTECTOMY Left 1998   w/ lympn node dissection's    Past Medical Hx:  Past Medical History:  Diagnosis Date  . Cancer ([place]) 1998   left tamoxifem x 5 yrs  . Coronary artery disease    CARDIOLOGIST-  DR Geraldo Pitter ([facility])  . Endometrial cancer ([place])   . GERD (gastroesophageal reflux disease)   . Glaucoma, both eyes   . History of breast cancer    1998--  s/p  left mastectomy with sln dissection's  ,  no chemo or radiation-  no recurrence  . History of Clostridium difficile infection 5-6 yrs ago  . History of TIAs    yrs ago ?   Marland Kitchen  Hyperlipidemia   . Hypertension   . Macular degeneration   . Narrow complex tachycardia [facility]    cardiologist-  dr Salley Scarlet  . OA (osteoarthritis)    right knee  . PMB (postmenopausal bleeding)   . PONV (postoperative nausea and vomiting)    ponv after appendectomy 1954  . Thickened endometrium   . Wears glasses     Past Gynecological History:  G1 (SVD) No LMP recorded. Patient is postmenopausal.  Family Hx:  Family History  Problem Relation Age of Onset  . Hypertension Mother   . Hypertension Father     Review of Systems:  Constitutional  Feels well,    ENT Normal appearing ears and nares bilaterally Skin/Breast  No rash, sores, jaundice, itching, dryness Cardiovascular  No chest pain, shortness of breath, or edema  Pulmonary  No cough or wheeze.  Gastro Intestinal  No nausea, vomitting, or diarrhoea. No bright  red blood per rectum, no abdominal pain, change in bowel movement, or constipation.  Genito Urinary  No frequency, urgency, dysuria, + postmenopausal bleeding Musculo Skeletal  No myalgia, arthralgia, joint swelling or pain  Neurologic  No weakness, numbness, change in gait,  Psychology  No depression, anxiety, insomnia.   Vitals:  Blood pressure (!) 152/63, pulse (!) 57, temperature 98.8 F (37.1 C), temperature source Oral, resp. rate 18, height 5\' 4"  (1.626 m), weight 133 lb 3.2 oz (60.4 kg), SpO2 99 %.  Physical Exam: WD in NAD Neck  Supple NROM, without any enlargements.  Lymph Node Survey No cervical supraclavicular or inguinal adenopathy Cardiovascular  Pulse normal rate, regularity and rhythm. S1 and S2 normal.  Lungs  Clear to auscultation bilateraly, without wheezes/crackles/rhonchi. Good air movement.  Skin  No rash/lesions/breakdown  Psychiatry  Alert and oriented to person, place, and time  Abdomen  Normoactive bowel sounds, abdomen soft, non-tender and thin without evidence of hernia.  Back No CVA tenderness Genito Urinary  Vulva/vagina: Normal external female genitalia.  No lesions. No discharge or bleeding.  Bladder/urethra:  No lesions or masses, well supported bladder  Vagina: narrow but permits pederson speculum and a normal appearing cervix is seen clearly. The vaginal walls are smooth and atrophic and free of lesions.  Cervix: Normal appearing, no lesions. Stenotic - unsuccessful attempt at office biopsy  Uterus: Small, mobile, no parametrial involvement or nodularity.  Adnexa: no palpable masses. Rectal  deferred Extremities  No bilateral cyanosis, clubbing or edema.  20 minutes of face to face counseling was spent with the patient.  [name], MD  12/15/2015, 5:20 PM

## 2015-12-15 NOTE — Patient Instructions (Signed)
Preparing for your Surgery  Plan for surgery on December 5 with Dr. Denman George  Pre-operative Testing -You will receive a phone call from presurgical testing at [facility] to arrange for a pre-operative testing appointment before your surgery.  This appointment normally occurs one to two weeks before your scheduled surgery.   -Bring your insurance card, copy of an advanced directive if applicable, medication list  -At that visit, you will be asked to sign a consent for a possible blood transfusion in case a transfusion becomes necessary during surgery.  The need for a blood transfusion is rare but having consent is a necessary part of your care.     -You should not be taking blood thinners or aspirin at least ten days prior to surgery unless instructed by your surgeon.  Day Before Surgery at [facility] will be asked to take in a light diet the day before surgery.  Avoid carbonated beverages.  You will be advised to have nothing to eat or drink after midnight the evening before.     Eat a light diet the day before surgery.  Examples including soups, broths,  toast, yogurt, mashed potatoes.  Things to avoid include carbonated beverages  (fizzy beverages), raw fruits and raw vegetables, or beans.    If your bowels are filled with gas, your surgeon will have difficulty  visualizing your pelvic organs which increases your surgical risks.  Your role in recovery Your role is to become active as soon as directed by your doctor, while still giving yourself time to heal.  Rest when you feel tired. You will be asked to do the following in order to speed your recovery:  - Cough and breathe deeply. This helps toclear and expand your lungs and can prevent pneumonia. You may be given a spirometer to practice deep breathing. A staff member will show you how to use the spirometer. - Do mild physical activity. Walking or moving your legs help your circulation and body functions return to  normal. A staff member will help you when you try to walk and will provide you with simple exercises. Do not try to get up or walk alone the first time. - Actively manage your pain. Managing your pain lets you move in comfort. We will ask you to rate your pain on a scale of zero to 10. It is your responsibility to tell your doctor or nurse where and how much you hurt so your pain can be treated.  Special Considerations -If you are diabetic, you may be placed on insulin after surgery to have closer control over your blood sugars to promote healing and recovery.  This does not mean that you will be discharged on insulin.  If applicable, your oral antidiabetics will be resumed when you are tolerating a solid diet.  -Your final pathology results from surgery should be available by the Friday after surgery and the results will be relayed to you when available.

## 2015-12-22 ENCOUNTER — Emergency Department (HOSPITAL_COMMUNITY): Payer: Medicare HMO

## 2015-12-22 ENCOUNTER — Other Ambulatory Visit: Payer: Self-pay

## 2015-12-22 ENCOUNTER — Observation Stay (HOSPITAL_COMMUNITY)
Admission: EM | Admit: 2015-12-22 | Discharge: 2015-12-23 | Disposition: A | Discharge status: Home / Self Care | Payer: Medicare HMO | Attending: Internal Medicine | Admitting: Internal Medicine

## 2015-12-22 ENCOUNTER — Encounter (HOSPITAL_COMMUNITY): Payer: Self-pay

## 2015-12-22 DIAGNOSIS — I10 Essential (primary) hypertension: Secondary | ICD-10-CM | POA: Diagnosis not present

## 2015-12-22 DIAGNOSIS — H3530 Unspecified macular degeneration: Secondary | ICD-10-CM | POA: Insufficient documentation

## 2015-12-22 DIAGNOSIS — Z853 Personal history of malignant neoplasm of breast: Secondary | ICD-10-CM | POA: Diagnosis not present

## 2015-12-22 DIAGNOSIS — Z9012 Acquired absence of left breast and nipple: Secondary | ICD-10-CM | POA: Diagnosis not present

## 2015-12-22 DIAGNOSIS — Z66 Do not resuscitate: Secondary | ICD-10-CM | POA: Insufficient documentation

## 2015-12-22 DIAGNOSIS — R200 Anesthesia of skin: Secondary | ICD-10-CM | POA: Diagnosis not present

## 2015-12-22 DIAGNOSIS — K219 Gastro-esophageal reflux disease without esophagitis: Secondary | ICD-10-CM | POA: Diagnosis not present

## 2015-12-22 DIAGNOSIS — E871 Hypo-osmolality and hyponatremia: Secondary | ICD-10-CM | POA: Insufficient documentation

## 2015-12-22 DIAGNOSIS — Z7902 Long term (current) use of antithrombotics/antiplatelets: Secondary | ICD-10-CM | POA: Insufficient documentation

## 2015-12-22 DIAGNOSIS — I2510 Atherosclerotic heart disease of native coronary artery without angina pectoris: Secondary | ICD-10-CM | POA: Diagnosis not present

## 2015-12-22 DIAGNOSIS — Z961 Presence of intraocular lens: Secondary | ICD-10-CM | POA: Insufficient documentation

## 2015-12-22 DIAGNOSIS — H409 Unspecified glaucoma: Secondary | ICD-10-CM | POA: Insufficient documentation

## 2015-12-22 DIAGNOSIS — Z8542 Personal history of malignant neoplasm of other parts of uterus: Secondary | ICD-10-CM | POA: Insufficient documentation

## 2015-12-22 DIAGNOSIS — E785 Hyperlipidemia, unspecified: Secondary | ICD-10-CM | POA: Insufficient documentation

## 2015-12-22 DIAGNOSIS — Z888 Allergy status to other drugs, medicaments and biological substances status: Secondary | ICD-10-CM | POA: Insufficient documentation

## 2015-12-22 DIAGNOSIS — G458 Other transient cerebral ischemic attacks and related syndromes: Secondary | ICD-10-CM

## 2015-12-22 DIAGNOSIS — Z9841 Cataract extraction status, right eye: Secondary | ICD-10-CM | POA: Insufficient documentation

## 2015-12-22 DIAGNOSIS — G459 Transient cerebral ischemic attack, unspecified: Principal | ICD-10-CM | POA: Diagnosis present

## 2015-12-22 DIAGNOSIS — Z8673 Personal history of transient ischemic attack (TIA), and cerebral infarction without residual deficits: Secondary | ICD-10-CM

## 2015-12-22 DIAGNOSIS — R29818 Other symptoms and signs involving the nervous system: Secondary | ICD-10-CM | POA: Diagnosis not present

## 2015-12-22 DIAGNOSIS — Z9842 Cataract extraction status, left eye: Secondary | ICD-10-CM | POA: Insufficient documentation

## 2015-12-22 DIAGNOSIS — M1711 Unilateral primary osteoarthritis, right knee: Secondary | ICD-10-CM | POA: Insufficient documentation

## 2015-12-22 DIAGNOSIS — R202 Paresthesia of skin: Secondary | ICD-10-CM | POA: Diagnosis not present

## 2015-12-22 DIAGNOSIS — I6789 Other cerebrovascular disease: Secondary | ICD-10-CM | POA: Diagnosis not present

## 2015-12-22 DIAGNOSIS — I672 Cerebral atherosclerosis: Secondary | ICD-10-CM | POA: Diagnosis not present

## 2015-12-22 DIAGNOSIS — R17 Unspecified jaundice: Secondary | ICD-10-CM | POA: Diagnosis present

## 2015-12-22 DIAGNOSIS — I480 Paroxysmal atrial fibrillation: Secondary | ICD-10-CM | POA: Diagnosis not present

## 2015-12-22 HISTORY — DX: Personal history of transient ischemic attack (TIA), and cerebral infarction without residual deficits: Z86.73

## 2015-12-22 HISTORY — DX: Transient cerebral ischemic attack, unspecified: G45.9

## 2015-12-22 LAB — COMPREHENSIVE METABOLIC PANEL
ALK PHOS: 63 U/L (ref 38–126)
ALT: 26 U/L (ref 14–54)
AST: 30 U/L (ref 15–41)
Albumin: 4.4 g/dL (ref 3.5–5.0)
Anion gap: 12 (ref 5–15)
BILIRUBIN TOTAL: 1.5 mg/dL — AB (ref 0.3–1.2)
BUN: 19 mg/dL (ref 6–20)
CO2: 22 mmol/L (ref 22–32)
CREATININE: 1.14 mg/dL — AB (ref 0.44–1.00)
Calcium: 9.7 mg/dL (ref 8.9–10.3)
Chloride: 100 mmol/L — ABNORMAL LOW (ref 101–111)
GFR calc Af Amer: 46 mL/min — ABNORMAL LOW (ref >60)
GFR, EST NON AFRICAN AMERICAN: 40 mL/min — AB (ref >60)
Glucose, Bld: 110 mg/dL — ABNORMAL HIGH (ref 65–99)
Potassium: 3.8 mmol/L (ref 3.5–5.1)
Sodium: 134 mmol/L — ABNORMAL LOW (ref 135–145)
TOTAL PROTEIN: 6.9 g/dL (ref 6.5–8.1)

## 2015-12-22 LAB — DIFFERENTIAL
BASOS ABS: 0 10*3/uL (ref 0.0–0.1)
Basophils Relative: 0 %
Eosinophils Absolute: 0.1 10*3/uL (ref 0.0–0.7)
Eosinophils Relative: 1 %
LYMPHS ABS: 1.5 10*3/uL (ref 0.7–4.0)
LYMPHS PCT: 19 %
MONOS PCT: 5 %
Monocytes Absolute: 0.4 10*3/uL (ref 0.1–1.0)
NEUTROS ABS: 5.7 10*3/uL (ref 1.7–7.7)
Neutrophils Relative %: 75 %

## 2015-12-22 LAB — PROTIME-INR
INR: 1.15
Prothrombin Time: 14.8 seconds (ref 11.4–15.2)

## 2015-12-22 LAB — I-STAT CHEM 8, ED
BUN: 23 mg/dL — AB (ref 6–20)
CALCIUM ION: 1.17 mmol/L (ref 1.15–1.40)
CHLORIDE: 101 mmol/L (ref 101–111)
CREATININE: 1.1 mg/dL — AB (ref 0.44–1.00)
Glucose, Bld: 97 mg/dL (ref 65–99)
HEMATOCRIT: 41 % (ref 36.0–46.0)
Hemoglobin: 13.9 g/dL (ref 12.0–15.0)
Potassium: 4.3 mmol/L (ref 3.5–5.1)
Sodium: 136 mmol/L (ref 135–145)
TCO2: 24 mmol/L (ref 0–100)

## 2015-12-22 LAB — I-STAT TROPONIN, ED: TROPONIN I, POC: 0.01 ng/mL (ref 0.00–0.08)

## 2015-12-22 LAB — CBC
HEMATOCRIT: 38.3 % (ref 36.0–46.0)
HEMOGLOBIN: 12.7 g/dL (ref 12.0–15.0)
MCH: 29.9 pg (ref 26.0–34.0)
MCHC: 33.2 g/dL (ref 30.0–36.0)
MCV: 90.1 fL (ref 78.0–100.0)
Platelets: 136 10*3/uL — ABNORMAL LOW (ref 150–400)
RBC: 4.25 MIL/uL (ref 3.87–5.11)
RDW: 14.2 % (ref 11.5–15.5)
WBC: 7.7 10*3/uL (ref 4.0–10.5)

## 2015-12-22 LAB — APTT: APTT: 34 s (ref 24–36)

## 2015-12-22 LAB — CBG MONITORING, ED: Glucose-Capillary: 98 mg/dL (ref 65–99)

## 2015-12-22 NOTE — Consult Note (Signed)
NEURO HOSPITALIST CONSULT NOTE   Requestig physician: Dr. Tomi Bamberger  Reason for Consult: Possible TIA  History obtained from:  Patient and EMS  HPI:                                                                                                                                          Sheri Hensley is an 82 y.o. female who presents with acute onset of right hand numbness and paresthesia, in addition to right face/tongue paresthesia. Also endorses transient tingling in her right calf. EMS states on exam she had mild right hand weakness. Family stated mild right facial droop as well. Recently taken off Plavix for scheduled hysterectomy tomorrow AM. Patient states she is back to baseline at time of Neurological exam, but feels anxious.   Past Medical History:  Diagnosis Date  . Cancer ([place]) 1998   left tamoxifem x 5 yrs  . Coronary artery disease    CARDIOLOGIST-  DR Geraldo Pitter ([facility])  . Endometrial cancer ([place])   . GERD (gastroesophageal reflux disease)   . Glaucoma, both eyes   . History of breast cancer    1998--  s/p  left mastectomy with sln dissection's  ,  no chemo or radiation-  no recurrence  . History of Clostridium difficile infection 5-6 yrs ago  . History of TIAs    yrs ago ?   Marland Kitchen Hyperlipidemia   . Hypertension   . Macular degeneration   . Narrow complex tachycardia [facility])    cardiologist-  dr Salley Scarlet  . OA (osteoarthritis)    right knee  . PMB (postmenopausal bleeding)   . PONV (postoperative nausea and vomiting)    ponv after appendectomy 1954  . Thickened endometrium   . Wears glasses     Past Surgical History:  Procedure Laterality Date  . APPENDECTOMY  1954  . CATARACT EXTRACTION W/ INTRAOCULAR LENS  IMPLANT, BILATERAL  1994  . colonscopy    . DILATION AND CURETTAGE OF UTERUS N/A 11/20/2015   Procedure: DILATATION AND CURETTAGE;  Surgeon: Everitt Amber, MD;  [facility];  Service:  Gynecology;  Laterality: N/A;  . KNEE ARTHROSCOPY Right 2002  . MASTECTOMY Left 1998   w/ lympn node dissection's    Family History  Problem Relation Age of Onset  . Hypertension Mother   . Hypertension Father     Social History:  reports that she has never smoked. She has never used smokeless tobacco. She reports that she drinks alcohol. She reports that she does not use drugs.  Allergies  Allergen Reactions  . Flagyl [Metronidazole] Rash    MEDICATIONS:  No outpatient prescriptions have been marked as taking for the 12/22/15 encounter [facility] Encounter).    ROS:                                                                                                                                       History obtained from patient. Did not endorse headache, vision loss, difficulty speaking or confusion. States she has chronic right knee pain. Also endorses anxiety.    Blood pressure (!) 209/81, pulse 66, resp. rate 16, SpO2 100 %.   Neurologic Examination:                                                                                                      HEENT-  Normocephalic/atraumatic.   Lungs- Mild wheezing noted.  Extremities- Warm and well perfused.    Neurological Examination Mental Status: Alert, oriented, thought content appropriate.  Speech fluent without evidence of aphasia.  Able to follow all commands without difficulty. Cranial Nerves: II: Visual fields grossly normal, pupils equal, round, reactive to light  III,IV, VI: ptosis not present, extra-ocular motions intact bilaterally V,VII: smile symmetric, facial temperature sensation normal bilaterally VIII: hearing intact to conversation IX,X: no hoarseness noted. Mild hypophonia.  XI: Symmetric XII: midline tongue extension Motor: Right : Upper extremity   5/5    Left:     Upper extremity    5/5  Lower extremity   5/5     Lower extremity   5/5 Normal tone throughout; no atrophy noted. No pronator drift.  Sensory: Temperature and light touch intact x 4 without extinction Deep Tendon Reflexes: 2+ and symmetric throughout Plantars: Equivocal bilaterally Cerebellar: Action tremor noted bilaterally with FNF.  Gait: Deferred due to falls risk concerns   Lab Results: Basic Metabolic Panel:  Recent Labs Lab 12/22/15 2230  NA 136  K 4.3  CL 101  GLUCOSE 97  BUN 23*  CREATININE 1.10*    Liver Function Tests: No results for input(s): AST, ALT, ALKPHOS, BILITOT, PROT, ALBUMIN in the last 168 hours. No results for input(s): LIPASE, AMYLASE in the last 168 hours. No results for input(s): AMMONIA in the last 168 hours.  CBC:  Recent Labs Lab 12/22/15 2230  HGB 13.9  HCT 41.0    Cardiac Enzymes: No results for input(s): CKTOTAL, CKMB, CKMBINDEX, TROPONINI in the last 168 hours.  Lipid Panel: No results for input(s): CHOL, TRIG, HDL, CHOLHDL, VLDL, LDLCALC in the last 168 hours.  CBG:  Recent Labs Lab 12/22/15 2219  GLUCAP 98    Microbiology: No results found for this or any previous visit.  Coagulation Studies: No results for input(s): LABPROT, INR in the last 72 hours.  Imaging: Ct Head Code Stroke W/o Cm  Addendum Date: 12/22/2015   ADDENDUM REPORT: 12/22/2015 22:42 ADDENDUM: These results were called by telephone at the time of interpretation on 12/22/2015 at 10:41 pm to Dr. Cheral Marker, who verbally acknowledged these results. Electronically Signed   By: Kristine Garbe M.D.   On: 12/22/2015 22:42   Result Date: 12/22/2015 CLINICAL DATA:  Code stroke.  Right-sided weakness and numbness. EXAM: CT HEAD WITHOUT CONTRAST TECHNIQUE: Contiguous axial images were obtained from the base of the skull through the vertex without intravenous contrast. COMPARISON:  03/12/2015 CT head. FINDINGS: Brain: No evidence of acute infarction, hemorrhage, hydrocephalus,  extra-axial collection or mass lesion/mass effect. Few foci of hypoattenuation within subcortical white matter compatible with mild chronic microvascular ischemic changes and stable. Mild brain parenchymal volume loss. Vascular: No hyperdense vessel. Unchanged calcifications at the tip of basilar, bilateral vertebral arteries, and within the internal carotid arteries. Skull: Normal. Negative for fracture or focal lesion. Sinuses/Orbits: No acute finding. Bilateral intra-ocular lens replacement. Other: None. ASPECTS [facility] Stroke Program Early CT Score) - Ganglionic level infarction (caudate, lentiform nuclei, internal capsule, insula, M1-M3 cortex): 7 - Supraganglionic infarction (M4-M6 cortex): 3 Total score (0-10 with 10 being normal): 10 IMPRESSION: 1. No acute intracranial abnormality identified. If symptoms persist or if clinically indicated MRI is more sensitive for acute stroke. 2. Mild chronic microvascular ischemic changes, parenchymal volume loss, and intracranial calcific atherosclerosis. 3. ASPECTS is 10 Electronically Signed: By: Kristine Garbe M.D. On: 12/22/2015 22:38    Assessment: 1. Acute onset of right sided paresthesias. Right hand weakness noted by EMTs, now resolved. DDx includes anxiety attack and TIA. Plavix was recently held in preparation for hysterectomy.  2. Stroke risk factors include prior history of TIAs, HTN and hyperlipidemia.  3. Endometrial cancer. Was scheduled for hysterectomy tomorrow.   Recommendations: 1. Stroke work up to include MRI brain, MRA head, carotid ultrasound and TTE.  2. Permissive HTN x 24 hours.  3. Restart Plavix 75 mg po qd, first dose now.  4. Given advanced age, overall statin risks most likely outweigh potential benefits regarding secondary stroke prevention.  5. PT/OT/Speech.  6. Will need to have hysterectomy rescheduled. Consider switching Plavix to ASA 81 mg qd, on a temporary basis, prior to rescheduled hysterectomy if her  surgeon feels that operation can be performed while on ASA.    Electronically signed: Dr. Kerney Elbe 12/22/2015, 10:53 PM

## 2015-12-22 NOTE — ED Triage Notes (Signed)
Patient comes by EMS for activated code stroke.  Was getting dressed and had weakness to her right arm and hand.  Was taken off Plavix for scheduled surgery tomorrow AM.  Patient A&Ox4 cleared by Darl Householder MD for scans.

## 2015-12-22 NOTE — ED Provider Notes (Signed)
[facility] DEPT Provider Note   CSN: [id number] Arrival date & time: 12/22/15  2215   An emergency department physician performed an initial assessment on this suspected stroke patient at 2216.  History   Chief Complaint Chief Complaint  Patient presents with  . Code Stroke    HPI Sheri Hensley is a 82 y.o. female.  HPI Pt was at home this evening when her hand started suddenly quivering and cramping up.  SHe could not move her right hand properly. Her tongue started to feel funny as did the right side of her face.  Her right leg felt a little funny.  It lasted for at least 15-20 minutes. Those symptoms have improved at this point.  SHe was activated as a code stroke by EMS.  That was deactivated by Dr Cheral Marker. Past Medical History:  Diagnosis Date  . Cancer ([place]) 1998   left tamoxifem x 5 yrs  . Coronary artery disease    CARDIOLOGIST-  DR Geraldo Pitter ([facility] -)  . Endometrial cancer ([place])   . GERD (gastroesophageal reflux disease)   . Glaucoma, both eyes   . History of breast cancer    1998--  s/p  left mastectomy with sln dissection's  ,  no chemo or radiation-  no recurrence  . History of Clostridium difficile infection 5-6 yrs ago  . History of TIAs    yrs ago ?   Marland Kitchen Hyperlipidemia   . Hypertension   . Macular degeneration   . Narrow complex tachycardia [facility])    cardiologist-  dr Salley Scarlet  . OA (osteoarthritis)    right knee  . PMB (postmenopausal bleeding)   . PONV (postoperative nausea and vomiting)    ponv after appendectomy 1954  . Thickened endometrium   . Wears glasses     Patient Active Problem List   Diagnosis Date Noted  . Endometrial cancer ([place]) 11/21/2015  . Postmenopausal bleeding 11/10/2015  . Thickened endometrium 11/10/2015    Past Surgical History:  Procedure Laterality Date  . APPENDECTOMY  1954  . CATARACT EXTRACTION W/ INTRAOCULAR LENS  IMPLANT, BILATERAL  1994  . colonscopy    . DILATION AND CURETTAGE OF  UTERUS N/A 11/20/2015   Procedure: DILATATION AND CURETTAGE;  Surgeon: Everitt Amber, MD;  Location: [facility];  Service: Gynecology;  Laterality: N/A;  . KNEE ARTHROSCOPY Right 2002  . MASTECTOMY Left 1998   w/ lympn node dissection's    OB History    No data available       Home Medications    Prior to Admission medications   Medication Sig Start Date End Date Taking? Authorizing Provider  acetaminophen (TYLENOL) 500 MG tablet Take 500 mg by mouth every 6 (six) hours as needed for moderate pain or headache.    Historical Provider, MD  ALPRAZolam Duanne Moron) 0.25 MG tablet Take 0.125-0.25 mg by mouth daily as needed for anxiety.     Historical Provider, MD  amiodarone (PACERONE) 200 MG tablet Take 100 mg by mouth every morning.  10/31/15   Historical Provider, MD  atorvastatin (LIPITOR) 10 MG tablet Take 10 mg by mouth at bedtime.    Historical Provider, MD  betamethasone acetate-betamethasone sodium phosphate (CELESTONE) 6 (3-3) MG/ML injection Inject 12 mg into the articular space every 3 (three) months. 08/07/15   Historical Provider, MD  bimatoprost (LUMIGAN) 0.01 % SOLN Place 1 drop into both eyes at bedtime.    Historical Provider, MD  brimonidine (ALPHAGAN P) 0.1 % SOLN Place 1  drop into both eyes every 8 (eight) hours.  03/12/13   Historical Provider, MD  cyanocobalamin (V-R VITAMIN B-12) 500 MCG tablet Take 500 mcg by mouth daily.     Historical Provider, MD  lisinopril (PRINIVIL,ZESTRIL) 10 MG tablet Take 10 mg by mouth every morning.     Historical Provider, MD  loratadine (CLARITIN) 10 MG tablet Take 10 mg by mouth daily as needed for allergies.    Historical Provider, MD  Melatonin 10 MG CAPS Take 10 mg by mouth at bedtime.     Historical Provider, MD  metoprolol tartrate (LOPRESSOR) 25 MG tablet Take 25 mg by mouth 2 (two) times daily.     Historical Provider, MD  Multiple Vitamins-Minerals (CENTRUM VITAMINTS PO) Take 1 tablet by mouth every morning.    Historical  Provider, MD  Multiple Vitamins-Minerals (ICAPS AREDS 2) CAPS Take 1 tablet by mouth daily.    Historical Provider, MD  omeprazole (PRILOSEC) 20 MG capsule Take 20 mg by mouth every morning. 08/28/15   Historical Provider, MD  sertraline (ZOLOFT) 50 MG tablet Take 50 mg by mouth daily.    Historical Provider, MD    Family History Family History  Problem Relation Age of Onset  . Hypertension Mother   . Hypertension Father     Social History Social History  Substance Use Topics  . Smoking status: Never Smoker  . Smokeless tobacco: Never Used  . Alcohol use Yes     Comment: occasional     Allergies   Flagyl [metronidazole]   Review of Systems Review of Systems  Eyes:       Occsnl blurred vision left eye, sees ophthalmology  All other systems reviewed and are negative.    Physical Exam Updated Vital Signs BP (!) 209/81   Pulse 66   Resp 16   SpO2 100%   Physical Exam  Constitutional: She is oriented to person, place, and time. No distress.  Elderly frail  HENT:  Head: Normocephalic and atraumatic.  Right Ear: External ear normal.  Left Ear: External ear normal.  Mouth/Throat: Oropharynx is clear and moist.  Eyes: Conjunctivae are normal. Right eye exhibits no discharge. Left eye exhibits no discharge. No scleral icterus.  Neck: Neck supple. No tracheal deviation present.  Cardiovascular: Normal rate, regular rhythm and intact distal pulses.   Pulmonary/Chest: Effort normal and breath sounds normal. No stridor. No respiratory distress. She has no wheezes. She has no rales.  Abdominal: Soft. Bowel sounds are normal. She exhibits no distension. There is no tenderness. There is no rebound and no guarding.  Musculoskeletal: She exhibits no edema or tenderness.  Neurological: She is alert and oriented to person, place, and time. She has normal strength. No cranial nerve deficit (No facial droop, extraocular movements intact, tongue midline ) or sensory deficit. She  exhibits normal muscle tone. She displays no seizure activity. Coordination normal.  No pronator drift bilateral upper extrem, able to hold both legs off bed for 5 seconds, sensation intact in all extremities, no visual field cuts, no left or right sided neglect, normal finger-nose exam bilaterally, no nystagmus noted   Skin: Skin is warm and dry. No rash noted.  Psychiatric: She has a normal mood and affect.  Nursing note and vitals reviewed.    ED Treatments / Results  Labs (all labs ordered are listed, but only abnormal results are displayed) Labs Reviewed  CBC - Abnormal; Notable for the following:       Result Value   Platelets  136 (*)    All other components within normal limits  COMPREHENSIVE METABOLIC PANEL - Abnormal; Notable for the following:    Sodium 134 (*)    Chloride 100 (*)    Glucose, Bld 110 (*)    Creatinine, Ser 1.14 (*)    Total Bilirubin 1.5 (*)    GFR calc non Af Amer 40 (*)    GFR calc Af Amer 46 (*)    All other components within normal limits  I-STAT CHEM 8, ED - Abnormal; Notable for the following:    BUN 23 (*)    Creatinine, Ser 1.10 (*)    All other components within normal limits  PROTIME-INR  APTT  DIFFERENTIAL  I-STAT TROPOININ, ED  CBG MONITORING, ED    EKG  EKG Interpretation  Date/Time:  Monday December 22 2015 22:39:39 EST Ventricular Rate:  65 PR Interval:    QRS Duration: 94 QT Interval:  456 QTC Calculation: 475 R Axis:   28 Text Interpretation:  Sinus rhythm Anteroseptal infarct, old No significant change since last tracing Confirmed by Darneisha Windhorst  MD-J, Kaelah Hayashi [id number]) on 12/22/2015 11:15:02 PM       Radiology Ct Head Code Stroke W/o Cm  Addendum Date: 12/22/2015   ADDENDUM REPORT: 12/22/2015 22:42 ADDENDUM: These results were called by telephone at the time of interpretation on 12/22/2015 at 10:41 pm to Dr. Cheral Marker, who verbally acknowledged these results. Electronically Signed   By: Kristine Garbe M.D.   On: 12/22/2015  22:42   Result Date: 12/22/2015 CLINICAL DATA:  Code stroke.  Right-sided weakness and numbness. EXAM: CT HEAD WITHOUT CONTRAST TECHNIQUE: Contiguous axial images were obtained from the base of the skull through the vertex without intravenous contrast. COMPARISON:  03/12/2015 CT head. FINDINGS: Brain: No evidence of acute infarction, hemorrhage, hydrocephalus, extra-axial collection or mass lesion/mass effect. Few foci of hypoattenuation within subcortical white matter compatible with mild chronic microvascular ischemic changes and stable. Mild brain parenchymal volume loss. Vascular: No hyperdense vessel. Unchanged calcifications at the tip of basilar, bilateral vertebral arteries, and within the internal carotid arteries. Skull: Normal. Negative for fracture or focal lesion. Sinuses/Orbits: No acute finding. Bilateral intra-ocular lens replacement. Other: None. ASPECTS [facility] Stroke Program Early CT Score) - Ganglionic level infarction (caudate, lentiform nuclei, internal capsule, insula, M1-M3 cortex): 7 - Supraganglionic infarction (M4-M6 cortex): 3 Total score (0-10 with 10 being normal): 10 IMPRESSION: 1. No acute intracranial abnormality identified. If symptoms persist or if clinically indicated MRI is more sensitive for acute stroke. 2. Mild chronic microvascular ischemic changes, parenchymal volume loss, and intracranial calcific atherosclerosis. 3. ASPECTS is 10 Electronically Signed: By: Kristine Garbe M.D. On: 12/22/2015 22:38    Procedures Procedures (including critical care time)  Medications Ordered in ED Medications - No data to display   Initial Impression / Assessment and Plan / ED Course  I have reviewed the triage vital signs and the nursing notes.  Pertinent labs & imaging results that were available during my care of the patient were reviewed by me and considered in my medical decision making (see chart for details).  Clinical Course     Pt presented to the ED  with TIA symptoms.  Sx improved by the time she arrived in the ED.  Labs reassuring.  No acute ct findings. Pt was seen by neurology.  Will admit for further stroke.TIA workup.  Final Clinical Impressions(s) / ED Diagnoses   Final diagnoses:  Transient cerebral ischemia, unspecified type  Hypertension, unspecified type  New Prescriptions New Prescriptions   No medications on file     Dorie Rank, MD 12/22/15 2330

## 2015-12-23 ENCOUNTER — Observation Stay (HOSPITAL_COMMUNITY): Payer: Medicare HMO

## 2015-12-23 ENCOUNTER — Ambulatory Visit (HOSPITAL_COMMUNITY): Admission: RE | Admit: 2015-12-23 | Payer: Medicare HMO | Source: Ambulatory Visit | Admitting: Gynecologic Oncology

## 2015-12-23 ENCOUNTER — Encounter (HOSPITAL_COMMUNITY)
Admission: EM | Disposition: A | Discharge status: Home / Self Care | Payer: Self-pay | Source: Home / Self Care | Attending: Emergency Medicine

## 2015-12-23 ENCOUNTER — Encounter (HOSPITAL_COMMUNITY): Payer: Self-pay | Admitting: Family Medicine

## 2015-12-23 ENCOUNTER — Observation Stay (HOSPITAL_BASED_OUTPATIENT_CLINIC_OR_DEPARTMENT_OTHER): Payer: Medicare HMO

## 2015-12-23 ENCOUNTER — Telehealth: Payer: Self-pay | Admitting: Gynecologic Oncology

## 2015-12-23 DIAGNOSIS — G451 Carotid artery syndrome (hemispheric): Secondary | ICD-10-CM

## 2015-12-23 DIAGNOSIS — I10 Essential (primary) hypertension: Secondary | ICD-10-CM | POA: Diagnosis not present

## 2015-12-23 DIAGNOSIS — R17 Unspecified jaundice: Secondary | ICD-10-CM | POA: Diagnosis present

## 2015-12-23 DIAGNOSIS — I480 Paroxysmal atrial fibrillation: Secondary | ICD-10-CM | POA: Diagnosis present

## 2015-12-23 DIAGNOSIS — E871 Hypo-osmolality and hyponatremia: Secondary | ICD-10-CM | POA: Diagnosis not present

## 2015-12-23 DIAGNOSIS — R209 Unspecified disturbances of skin sensation: Secondary | ICD-10-CM | POA: Diagnosis not present

## 2015-12-23 DIAGNOSIS — G459 Transient cerebral ischemic attack, unspecified: Secondary | ICD-10-CM

## 2015-12-23 HISTORY — DX: Hypo-osmolality and hyponatremia: E87.1

## 2015-12-23 HISTORY — DX: Unspecified jaundice: R17

## 2015-12-23 HISTORY — DX: Paroxysmal atrial fibrillation: I48.0

## 2015-12-23 LAB — OSMOLALITY: OSMOLALITY: 289 mosm/kg (ref 275–295)

## 2015-12-23 LAB — ECHOCARDIOGRAM COMPLETE
AOPV: 1.04 m/s
AV Area mean vel: 2.62 cm2
AV VEL mean LVOT/AV: 1.03
AV area mean vel ind: 1.61 cm2/m2
AV peak Index: 1.62
AV pk vel: 205 cm/s
AVAREAVTI: 2.64 cm2
AVAREAVTIIND: 1.52 cm2/m2
AVG: 11 mmHg
AVLVOTPG: 18 mmHg
AVPG: 17 mmHg
Area-P 1/2: 2.29 cm2
CHL CUP AV VEL: 2.48
CHL CUP LVOT MV VTI INDEX: 1.34 cm2/m2
CHL CUP MV M VEL: 102
DOP CAL AO MEAN VELOCITY: 152 cm/s
E decel time: 345 msec
E/e' ratio: 12.87
FS: 46 % — AB (ref 28–44)
HEIGHTINCHES: 64 in
IVS/LV PW RATIO, ED: 0.77
LA ID, A-P, ES: 38 mm
LA vol: 57 mL
LADIAMINDEX: 2.33 cm/m2
LAVOLA4C: 53 mL
LAVOLIN: 35 mL/m2
LEFT ATRIUM END SYS DIAM: 38 mm
LV TDI E'LATERAL: 8.16
LV TDI E'MEDIAL: 5
LVEEAVG: 12.87
LVEEMED: 12.87
LVELAT: 8.16 cm/s
LVOT MV VTI: 2.18
LVOT SV: 113 mL
LVOT VTI: 44.5 cm
LVOT area: 2.54 cm2
LVOT diameter: 18 mm
LVOTPV: 213 cm/s
LVOTVTI: 0.98 cm
MV Annulus VTI: 51.9 cm
MV Dec: 345
MV Peak grad: 4 mmHg
MV pk E vel: 105 m/s
MVG: 5 mmHg
MVPKAVEL: 165 m/s
MVSPHT: 96 ms
PW: 12.5 mm — AB (ref 0.6–1.1)
RV LATERAL S' VELOCITY: 13.2 cm/s
TAPSE: 19.7 mm
VTI: 45.5 cm
Valve area index: 1.52
Valve area: 2.48 cm2
Weight: 2063.51 oz

## 2015-12-23 LAB — BILIRUBIN, FRACTIONATED(TOT/DIR/INDIR)
BILIRUBIN DIRECT: 0.2 mg/dL (ref 0.1–0.5)
BILIRUBIN TOTAL: 1.6 mg/dL — AB (ref 0.3–1.2)
Indirect Bilirubin: 1.4 mg/dL — ABNORMAL HIGH (ref 0.3–0.9)

## 2015-12-23 LAB — LIPID PANEL
CHOL/HDL RATIO: 3.2 ratio
Cholesterol: 132 mg/dL (ref 0–200)
HDL: 41 mg/dL (ref >40)
LDL CALC: 65 mg/dL (ref 0–99)
Triglycerides: 130 mg/dL (ref <150)
VLDL: 26 mg/dL (ref 0–40)

## 2015-12-23 LAB — TYPE AND SCREEN
ABO/RH(D): O NEG
Antibody Screen: NEGATIVE

## 2015-12-23 SURGERY — HYSTERECTOMY, VAGINAL, ROBOT-ASSISTED
Anesthesia: General

## 2015-12-23 MED ORDER — ACETAMINOPHEN 650 MG RE SUPP: 650.0000 mg | RECTAL | Status: DC | PRN

## 2015-12-23 MED ORDER — LORAZEPAM 1 MG PO TABS: 1.0000 mg | ORAL_TABLET | Freq: Once | ORAL | Status: DC | PRN

## 2015-12-23 MED ORDER — LATANOPROST 0.005 % OP SOLN
1.0000 [drp] | Freq: Every day | OPHTHALMIC | Status: DC
  Filled 2015-12-23: qty 2.5

## 2015-12-23 MED ORDER — ENOXAPARIN SODIUM 30 MG/0.3ML ~~LOC~~ SOLN
30.0000 mg | SUBCUTANEOUS | Status: DC
  Administered 2015-12-23: 30 mg via SUBCUTANEOUS
  Filled 2015-12-23: qty 0.3

## 2015-12-23 MED ORDER — STROKE: EARLY STAGES OF RECOVERY BOOK
Freq: Once | Status: AC
  Administered 2015-12-23: 1
  Filled 2015-12-23: qty 1

## 2015-12-23 MED ORDER — ACETAMINOPHEN 325 MG PO TABS: 650.0000 mg | ORAL_TABLET | ORAL | Status: DC | PRN

## 2015-12-23 MED ORDER — LORAZEPAM 2 MG/ML IJ SOLN
1.0000 mg | Freq: Once | INTRAMUSCULAR | Status: AC | PRN
Start: 2015-12-23 — End: 2015-12-23
  Administered 2015-12-23: 1 mg via INTRAVENOUS
  Filled 2015-12-23: qty 1

## 2015-12-23 MED ORDER — AMIODARONE HCL 200 MG PO TABS
100.0000 mg | ORAL_TABLET | Freq: Every morning | ORAL | Status: DC
  Administered 2015-12-23: 100 mg via ORAL
  Filled 2015-12-23: qty 1

## 2015-12-23 MED ORDER — BRIMONIDINE TARTRATE 0.15 % OP SOLN
1.0000 [drp] | Freq: Three times a day (TID) | OPHTHALMIC | Status: DC
  Administered 2015-12-23: 1 [drp] via OPHTHALMIC
  Filled 2015-12-23: qty 5

## 2015-12-23 MED ORDER — CLOPIDOGREL BISULFATE 75 MG PO TABS
75.0000 mg | ORAL_TABLET | Freq: Every day | ORAL | Status: DC
  Administered 2015-12-23: 75 mg via ORAL
  Filled 2015-12-23 (×2): qty 1

## 2015-12-23 MED ORDER — WHITE PETROLATUM GEL
Status: AC
  Filled 2015-12-23: qty 1

## 2015-12-23 MED ORDER — CLOPIDOGREL BISULFATE 75 MG PO TABS: 75.0000 mg | ORAL_TABLET | Freq: Every day | ORAL | 0 refills | Status: DC

## 2015-12-23 MED ORDER — ATORVASTATIN CALCIUM 10 MG PO TABS
10.0000 mg | ORAL_TABLET | Freq: Every day | ORAL | Status: DC
Start: 2015-12-23 — End: 2015-12-23

## 2015-12-23 MED ORDER — SERTRALINE HCL 50 MG PO TABS
50.0000 mg | ORAL_TABLET | Freq: Every day | ORAL | Status: DC
  Administered 2015-12-23: 50 mg via ORAL
  Filled 2015-12-23: qty 1

## 2015-12-23 NOTE — Evaluation (Signed)
Physical Therapy Evaluation Patient Details Name: Sheri Hensley MRN: [id number] DOB: [birth year] Today's Date: 12/23/2015   History of Present Illness  Pt is a 82 y.o.femalewith a past medical history significant for HTN, Afib on amiodarone not Afib, possible TIA, CAD and recently diagnosed endometrial cancer who presents with transient right sided weakness.All of her symptoms resolved within about 20 minutes. MRI revealed no acute changes but stable old right cerebellar and right thalamus infacts.   Clinical Impression  Pt admitted with above diagnosis. Pt currently with functional limitations due to the deficits listed below (see PT Problem List). At the time of PT eval pt was able to perform transfers and ambulation with min guard to min assist for balance support and safety. Pt lives alone, but family present and supportive, stating they could assist in caring for her at d/c if needed. As pt requiring min assist for OOB mobility, feel she needs 24 hour supervision at this time. Pt will benefit from skilled PT to increase their independence and safety with mobility to allow discharge to the venue listed below.       Follow Up Recommendations Home health PT; 24 hour Supervision    Equipment Recommendations  None recommended by PT    Recommendations for Other Services       Precautions / Restrictions Precautions Precautions: Fall Restrictions Weight Bearing Restrictions: No      Mobility  Bed Mobility Overal bed mobility: Needs Assistance Bed Mobility: Supine to Sit     Supine to sit: Supervision     General bed mobility comments: Pt was able to transition to EOB with no assistance. Supervision for safety provided.   Transfers Overall transfer level: Needs assistance Equipment used: Rolling walker (2 wheeled) Transfers: Sit to/from Stand Sit to Stand: Min assist         General transfer comment: Assist to power-up to full standing position. Increased time  required to gain/maintain standing balance. Pt more comfortable with RW use for balance versus HHA.   Ambulation/Gait Ambulation/Gait assistance: Min assist Ambulation Distance (Feet): 75 Feet Assistive device: Rolling walker (2 wheeled) Gait Pattern/deviations: Step-through pattern;Decreased stride length;Trunk flexed Gait velocity: Likely age appropriate Gait velocity interpretation: at or above normal speed for age/gender General Gait Details: Slow and guarded gait pattern. Pt required occasional min assist for balance support and safety with RW use.   Stairs            Wheelchair Mobility    Modified Rankin (Stroke Patients Only) Modified Rankin (Stroke Patients Only) Pre-Morbid Rankin Score: No significant disability Modified Rankin: Moderately severe disability     Balance Overall balance assessment: Needs assistance Sitting-balance support: Feet supported;No upper extremity supported Sitting balance-Leahy Scale: Good     Standing balance support: No upper extremity supported;During functional activity Standing balance-Leahy Scale: Poor                               Pertinent Vitals/Pain Pain Assessment: No/denies pain Pain Score: 5  Pain Descriptors / Indicators: Headache Pain Intervention(s): Limited activity within patient's tolerance;Monitored during session;Repositioned    Home Living Family/patient expects to be discharged to:: Private residence Living Arrangements: Alone Available Help at Discharge: Family;Available 24 hours/day Type of Home: House Home Access: Stairs to enter Entrance Stairs-Rails: Right;Left;Can reach both Entrance Stairs-Number of Steps: 2 Home Layout: One level Home Equipment: Clinical cytogeneticist - 2 wheels;Cane - single point      Prior Function Level  of Independence: Independent         Comments: Pt still drives but not often     Hand Dominance   Dominant Hand: Right    Extremity/Trunk Assessment    Upper Extremity Assessment: Defer to OT evaluation           Lower Extremity Assessment: Overall WFL for tasks assessed (Age appropriate strength)      Cervical / Trunk Assessment: Kyphotic  Communication   Communication: No difficulties  Cognition Arousal/Alertness: Awake/alert Behavior During Therapy: WFL for tasks assessed/performed Overall Cognitive Status: Within Functional Limits for tasks assessed                      General Comments      Exercises     Assessment/Plan    PT Assessment Patient needs continued PT services  PT Problem List Decreased strength;Decreased range of motion;Decreased activity tolerance;Decreased balance;Decreased mobility;Decreased knowledge of use of DME;Decreased safety awareness;Decreased knowledge of precautions;Pain          PT Treatment Interventions DME instruction;Gait training;Stair training;Functional mobility training;Therapeutic activities;Therapeutic exercise;Neuromuscular re-education;Patient/family education    PT Goals (Current goals can be found in the Care Plan section)  Acute Rehab PT Goals PT Goal Formulation: With patient/family Time For Goal Achievement: 12/30/15 Potential to Achieve Goals: Good    Frequency Min 3X/week   Barriers to discharge        Co-evaluation               End of Session Equipment Utilized During Treatment: Gait belt Activity Tolerance: Patient tolerated treatment well Patient left: in chair;with call bell/phone within reach;with family/visitor present Nurse Communication: Mobility status    Functional Assessment Tool Used: Clinical judgement Functional Limitation: Mobility: Walking and moving around Mobility: Walking and Moving Around Current Status [phone]): At least 20 percent but less than 40 percent impaired, limited or restricted Mobility: Walking and Moving Around Goal Status [phone]): At least 20 percent but less than 40 percent impaired, limited or restricted     Time: 1103-1131 PT Time Calculation (min) (ACUTE ONLY): 28 min   Charges:   PT Evaluation $PT Eval Moderate Complexity: 1 Procedure PT Treatments $Gait Training: 8-22 mins   PT G Codes:   PT G-Codes **NOT FOR INPATIENT CLASS** Functional Assessment Tool Used: Clinical judgement Functional Limitation: Mobility: Walking and moving around Mobility: Walking and Moving Around Current Status [id number]): At least 20 percent but less than 40 percent impaired, limited or restricted Mobility: Walking and Moving Around Goal Status [phone]): At least 20 percent but less than 40 percent impaired, limited or restricted    Thelma Comp 12/23/2015, 2:49 PM   Rolinda Roan, PT, DPT Acute Rehabilitation Services Pager: [phone]

## 2015-12-23 NOTE — ED Notes (Signed)
Patient is A&Ox4 protecting airway and in no signs of distress noted at this time.  Patient is anxious at this time as she was scheduled for surgery on 12-23-15.  Patient remains anxious but easily resolved with conversation.

## 2015-12-23 NOTE — Evaluation (Signed)
Speech Language Pathology Evaluation Patient Details Name: Sheri Hensley MRN: [id number] DOB: [birth year] Today's Date: 12/23/2015 Time: [id number] SLP Time Calculation (min) (ACUTE ONLY): 16 min  Problem List:  Patient Active Problem List   Diagnosis Date Noted  . Paroxysmal atrial fibrillation ([place]) 12/23/2015  . Hyponatremia 12/23/2015  . Elevated bilirubin 12/23/2015  . TIA (transient ischemic attack) 12/22/2015  . Endometrial cancer ([place]) 11/21/2015  . Postmenopausal bleeding 11/10/2015  . Thickened endometrium 11/10/2015  . Essential hypertension 10/24/2014   Past Medical History:  Past Medical History:  Diagnosis Date  . Cancer ([place]) 1998   left tamoxifem x 5 yrs  . Coronary artery disease    CARDIOLOGIST-  DR Geraldo Pitter ([facility])  . Endometrial cancer ([place])   . GERD (gastroesophageal reflux disease)   . Glaucoma, both eyes   . History of breast cancer    1998--  s/p  left mastectomy with sln dissection's  ,  no chemo or radiation-  no recurrence  . History of Clostridium difficile infection 5-6 yrs ago  . History of TIAs    yrs ago ?   Marland Kitchen Hyperlipidemia   . Hypertension   . Macular degeneration   . Narrow complex tachycardia [facility])    cardiologist-  dr Salley Scarlet  . OA (osteoarthritis)    right knee  . PMB (postmenopausal bleeding)   . PONV (postoperative nausea and vomiting)    ponv after appendectomy 1954  . Thickened endometrium   . Wears glasses    Past Surgical History:  Past Surgical History:  Procedure Laterality Date  . APPENDECTOMY  1954  . CATARACT EXTRACTION W/ INTRAOCULAR LENS  IMPLANT, BILATERAL  1994  . colonscopy    . DILATION AND CURETTAGE OF UTERUS N/A 11/20/2015   Procedure: DILATATION AND CURETTAGE;  Surgeon: Everitt Amber, MD;  Location: [facility];  Service: Gynecology;  Laterality: N/A;  . DILATION AND CURETTAGE, DIAGNOSTIC / THERAPEUTIC N/A novmber 2017   per daughter with biopsy as stated  . KNEE  ARTHROSCOPY Right 2002  . MASTECTOMY Left 1998   w/ lympn node dissection's   HPI:  Sheri Raisbeck McCallumis a 82 y.o.femalewith a past medical history significant for HTN, Afib on amiodarone not Afib, possible TIA, CAD and recently diagnosed endometrial cancer who presents with transient right sided weakness. MRI negative for acute process but revealedoOld small RIGHT cerebellar and RIGHT thalamus infarcts. Mild chronic small vessel ischemic disease. Per chart all of her symptoms resolved within about 20 minutes.   Assessment / Plan / Recommendation Clinical Impression  Pt administered various subtests of the MOCA and Cognistat and performed within functional limits. She is an active and high functioning 82 year old who lives alone with frequent check ins via daughter. Pt states she has no difficulty using her pill box and fills it independently. Daughter assists if/when she has questions with managing finances and writes important information on her calendar. From ST standpoint pt okay to return home with assist from daughter when needed.       SLP Assessment  Patient does not need any further Speech Lanaguage Pathology Services    Follow Up Recommendations  None    Frequency and Duration           SLP Evaluation Cognition  Overall Cognitive Status: Within Functional Limits for tasks assessed Arousal/Alertness: Awake/alert Orientation Level: Oriented to person;Oriented to place;Oriented to situation Attention: Sustained Sustained Attention: Appears intact Memory:  (functional, reports some decline with age) Awareness: Appears intact  Problem Solving: Appears intact (for verbal) Safety/Judgment: Appears intact       Comprehension  Auditory Comprehension Overall Auditory Comprehension: Appears within functional limits for tasks assessed Commands: Within Functional Limits ( multistep) Visual Recognition/Discrimination Discrimination: Not tested Reading Comprehension Reading  Status: Not tested    Expression Expression Primary Mode of Expression: Verbal Verbal Expression Overall Verbal Expression: Appears within functional limits for tasks assessed Initiation: No impairment Level of Generative/Spontaneous Verbalization: Conversation Repetition: No impairment Naming: No impairment Pragmatics: No impairment Written Expression Dominant Hand: Right Written Expression: Within Functional Limits   Oral / Motor  Oral Motor/Sensory Function Overall Oral Motor/Sensory Function: Within functional limits Motor Speech Overall Motor Speech: Appears within functional limits for tasks assessed Respiration: Within functional limits Phonation: Normal Resonance: Within functional limits Articulation: Within functional limitis Intelligibility: Intelligible Motor Planning: Witnin functional limits   GO          Functional Assessment Tool Used: skilled clinical judgement Functional Limitations: Memory Memory Current Status [id number]): At least 1 percent but less than 20 percent impaired, limited or restricted Memory Goal Status [id number]): At least 1 percent but less than 20 percent impaired, limited or restricted Memory Discharge Status [phone]): At least 1 percent but less than 20 percent impaired, limited or restricted         Houston Siren 12/23/2015, 12:04 PM  Orbie Pyo Colvin Caroli.Ed [employer] [phone]

## 2015-12-23 NOTE — Telephone Encounter (Signed)
Called daughter back.  Informed of Dr. Serita Grit recommendations.  Another appt made with Dr. Denman George to discuss options in the office and will hold OR time on Jan 9.  Advised to call for any needs.

## 2015-12-23 NOTE — Care Management Note (Signed)
Case Management Note  Patient Details  Name: Sheri Hensley MRN: [id number] Date of Birth: [birth year]  Subjective/Objective:   Pt in with TIA. She is from home alone.                Action/Plan: Awaiting PT/OT recommendations. CM following for d/c needs.   Expected Discharge Date:                  Expected Discharge Plan:     In-House Referral:     Discharge planning Services     Post Acute Care Choice:    Choice offered to:     DME Arranged:    DME Agency:     HH Arranged:    HH Agency:     Status of Service:  In process, will continue to follow  If discussed at Long Length of Stay Meetings, dates discussed:    Additional Comments:  Pollie Friar, RN 12/23/2015, 2:29 PM

## 2015-12-23 NOTE — Care Management Obs Status (Signed)
[facility] NOTIFICATION   Patient Details  Name: Sheri Hensley MRN: [id number] Date of Birth: 08/23/1919   Medicare Observation Status Notification Given:  Yes    Pollie Friar, RN 12/23/2015, 1:00 PM

## 2015-12-23 NOTE — Progress Notes (Signed)
  Echocardiogram 2D Echocardiogram has been performed.  Sheri Hensley 12/23/2015, 3:57 PM

## 2015-12-23 NOTE — Progress Notes (Signed)
*  PRELIMINARY RESULTS* Vascular Ultrasound Carotid Duplex (Doppler) has been completed.  Preliminary findings: Findings consistent with a 1- 1 percent stenosis involving the right internal carotid artery and the left internal carotid artery. Elevated velocities of the left external carotid artery. Bilateral vertebral arteries are patent and antegrade.  Sheri Hensley 12/23/2015, 3:14 PM

## 2015-12-23 NOTE — Care Management Note (Addendum)
Case Management Note  Patient Details  Name: JONAE RENSHAW MRN: [id number] Date of Birth: [birth year]  Subjective/Objective:                    Action/Plan: Pt discharging home with orders for [facility] services. CM met with the patient and her granddaughters and provided a list of [facility] agencies in [place]. They chose [facility]. Karolee Stamps with [facility] notified and await return call.   AddendumAlvis Lemmings unable to accept the referral. Family picked [facility] [place] called [facility] and they accepted the referral. Information they requested was faxed.   Expected Discharge Date:                  Expected Discharge Plan:  [place]  In-House Referral:     Discharge planning Services  CM Consult  Post Acute Care Choice:  Home Health Choice offered to:  Patient  DME Arranged:    DME Agency:     HH Arranged:  PT HH Agency:  [facility]  Status of Service:  Completed, signed off  If discussed at [place] of Stay Meetings, dates discussed:    Additional Comments:  Pollie Friar, RN 12/23/2015, 4:29 PM

## 2015-12-23 NOTE — Telephone Encounter (Signed)
Patient's family member called to discuss recent events.  She states she noticed the patient right hand "draw up then it went to her face."  She is admitted at [facility] currently and her surgery has been cancelled.  She is asking about when to resume Plavix and if she could be rescheduled as soon as possible.  Advised her that the OR schedule is booked out until Jan 2018 and she would need neuro clearance/recommendations before surgery as well.  Advised her that I would speak with Dr. Denman George about her concerns and call her back.

## 2015-12-23 NOTE — ED Notes (Signed)
Patient comes in by EMS for weakness that started 12-22-15 at 2130.  Right hand grip was the only complaint for EMS.  Patient is A&Ox4 and is anxious at times.  Ativan given before MRI.  Swallow screen given and passed prior to giving Plavix.

## 2015-12-23 NOTE — Progress Notes (Signed)
Sheri Hensley is a 82 yo female who presents with Right hand, face, and tongue numbness/tingling. Recently d/c'd Plavix for a scheduled hysterectomy in am.  LKW 2130, NIHSS 0, CBG 97. Plan is to admit for a TIA work up.

## 2015-12-23 NOTE — Discharge Summary (Signed)
PATIENT DETAILS Name: Sheri Hensley Age: 82 y.o. Sex: female Date of Birth: 09-21-1919 MRN: [id number]. Admitting Physician: Edwin Dada, MD [id number] Angelique Blonder., MD  Admit Date: 12/22/2015 Discharge date: 12/23/2015  Recommendations for Outpatient Follow-up:  1. Follow up with PCP in 1-2 weeks 2. Please obtain BMP/CBC in one week 3. Ensure follow-up with GYN oncology 4. Please follow up on the following pending results: A1c  Admitted From:  Home  Disposition: Home with home health services    Home Health: Yes  Equipment/Devices: None  Discharge Condition: Stable  CODE STATUS: FULL CODE  Diet recommendation:  Heart Healthy   Brief Summary: See H&P, Labs, Consult and Test reports for all details in brief, patient is a 82 year old female with history of paroxysmal atrial fibrillation on amiodarone for rate control, not on any anticoagulation due to fall risk (per primary cardiologist) history of endometrial cancer was scheduled for a hysterectomy on 12/5-admitted with transient right-sided numbness and weakness. See below for further details  Brief Hospital Course: TIA: Did have very transient right hand and right facial numbness that has since resolved and has not reoccurred since admission. MRI of the brain was negative for CVA. 2-D echocardiogram did not show any embolic foci. Carotid Doppler was negative for significant stenosis. LDL at 65-continue statins. A1c is pending please follow. Long discussion with the patient and patient's daughter, very difficult situation regarding long-term anticoagulation. Reviewed patient's primary audiologist's note in her care everywhere, and subsequently spoke with patient's primary cardiologist-Dr. Revankar over the phone, he does not think the patient is a good long-term candidate for anticoagulation, furthermore patient is scheduled for hysterectomy in the near future. After long discussion with patient,  neurology-recommendations are to resume Plavix on discharge. After patient completes hysterectomy, she will follow-up with a cardiologist to see what her options for long-term anticoagulation. Have asked patient to stop Plavix 5 days before the proposed surgical procedure, and to take a baby aspirin till her surgical procedure.  Rest of her medical issues were stable during the short hospital stay  Procedures/Studies: None  Discharge Diagnoses:  Active Problems:   TIA (transient ischemic attack)   Essential hypertension   Paroxysmal atrial fibrillation (HCC)   Hyponatremia   Elevated bilirubin   Discharge Instructions:  Activity:  As tolerated with Full fall precautions use walker/cane & assistance as needed  Discharge Instructions    Ambulatory referral to Neurology    Complete by:  As directed    Call MD for:  persistant dizziness or light-headedness    Complete by:  As directed    Diet - low sodium heart healthy    Complete by:  As directed    Discharge instructions    Complete by:  As directed    Follow with Primary MD  [facility] Angelique Blonder., MD  in 1 week  Stop Plavix 5 days before a planned surgical procedure, then start taking a baby aspirin daily till your surgical procedure. Once you complete your surgical procedure, speak with your cardiologist to see if he thinks you're a candidate for blood thinners.  Please get a complete blood count and chemistry panel checked by your Primary MD at your next visit, and again as instructed by your Primary MD.  Get Medicines reviewed and adjusted: Please take all your medications with you for your next visit with your Primary MD  Laboratory/radiological data: Please request your Primary MD to go over all hospital tests and procedure/radiological results at the follow up, please  ask your Primary MD to get all Hospital records sent to his/her office.  In some cases, they will be blood work, cultures and biopsy results pending at the  time of your discharge. Please request that your primary care M.D. follows up on these results.  Also Note the following: If you experience worsening of your admission symptoms, develop shortness of breath, life threatening emergency, suicidal or homicidal thoughts you must seek medical attention immediately by calling 911 or calling your MD immediately  if symptoms less severe.  You must read complete instructions/literature along with all the possible adverse reactions/side effects for all the Medicines you take and that have been prescribed to you. Take any new Medicines after you have completely understood and accpet all the possible adverse reactions/side effects.   Do not drive when taking Pain medications or sleeping medications (Benzodaizepines)  Do not take more than prescribed Pain, Sleep and Anxiety Medications. It is not advisable to combine anxiety,sleep and pain medications without talking with your primary care practitioner  Special Instructions: If you have smoked or chewed Tobacco  in the last 2 yrs please stop smoking, stop any regular Alcohol  and or any Recreational drug use.  Wear Seat belts while driving.  Please note: You were cared for by a hospitalist during your hospital stay. Once you are discharged, your primary care physician will handle any further medical issues. Please note that NO REFILLS for any discharge medications will be authorized once you are discharged, as it is imperative that you return to your primary care physician (or establish a relationship with a primary care physician if you do not have one) for your post hospital discharge needs so that they can reassess your need for medications and monitor your lab values.   Increase activity slowly    Complete by:  As directed        Medication List    TAKE these medications   acetaminophen 500 MG tablet Commonly known as:  TYLENOL Take 500 mg by mouth every 6 (six) hours as needed for moderate pain or  headache.   ALPHAGAN P 0.1 % Soln Generic drug:  brimonidine Place 1 drop into both eyes every 8 (eight) hours.   ALPRAZolam 0.25 MG tablet Commonly known as:  XANAX Take 0.125-0.25 mg by mouth daily as needed for anxiety.   amiodarone 200 MG tablet Commonly known as:  PACERONE Take 100 mg by mouth every morning.   atorvastatin 10 MG tablet Commonly known as:  LIPITOR Take 10 mg by mouth at bedtime.   betamethasone acetate-betamethasone sodium phosphate 6 (3-3) MG/ML injection Commonly known as:  CELESTONE Inject 12 mg into the articular space every 3 (three) months.   CENTRUM VITAMINTS PO Take 1 tablet by mouth every morning.   ICAPS AREDS 2 Caps Take 1 tablet by mouth daily.   clopidogrel 75 MG tablet Commonly known as:  PLAVIX Take 1 tablet (75 mg total) by mouth daily. Start taking on:  12/24/2015   lisinopril 10 MG tablet Commonly known as:  PRINIVIL,ZESTRIL Take 10 mg by mouth every morning.   loratadine 10 MG tablet Commonly known as:  CLARITIN Take 10 mg by mouth daily as needed for allergies.   LUMIGAN 0.01 % Soln Generic drug:  bimatoprost Place 1 drop into both eyes at bedtime.   Melatonin 10 MG Caps Take 10 mg by mouth at bedtime.   metoprolol tartrate 25 MG tablet Commonly known as:  LOPRESSOR Take 25 mg by mouth 2 (two)  times daily.   omeprazole 20 MG capsule Commonly known as:  PRILOSEC Take 20 mg by mouth every morning.   sertraline 50 MG tablet Commonly known as:  ZOLOFT Take 50 mg by mouth daily.   V-R VITAMIN B-12 500 MCG tablet Generic drug:  cyanocobalamin Take 500 mcg by mouth daily.      Follow-up Information    REDDING Angelique Blonder., MD. Schedule an appointment as soon as possible for a visit in 2 week(s).   Specialty:  Family Medicine Contact information: [address] [place] [address] [phone]        Xu,Jindong, MD Follow up.   Specialty:  Neurology Why:  Office will call with a follow-up appointment, if  you do not hear from them in the next few weeks, please give them a call. Contact information: [address] [place] [address] [phone]          Allergies  Allergen Reactions  . Flagyl [Metronidazole] Rash    Consultations:   neurology  Other Procedures/Studies: Dg Chest 2 View  Result Date: 12/15/2015 CLINICAL DATA:  82 year old female preop hysterectomy for endometrial cancer EXAM: CHEST  2 VIEW COMPARISON:  03/15/2015, 03/13/2015, CT chest 03/14/2015 FINDINGS: Cardiomediastinal silhouette unchanged in size and contour. No evidence of central vascular congestion. Surgical changes of left mastectomy and nodal dissection of the left axilla. Partially healed fracture at the rib angle of left seventh and eighth ribs. No pleural effusion. No pneumothorax. No confluent airspace disease. Calcifications of the aortic arch with tortuosity of the thoracic aorta. IMPRESSION: Chronic lung changes without evidence of superimposed acute cardiopulmonary disease. Partially healed left-sided fractures of the seventh and eighth ribs, unchanged from prior. Aortic atherosclerosis. Signed, Dulcy Fanny. Earleen Newport, DO [facility] Radiology Electronically Signed   By: Corrie Mckusick D.O.   On: 12/15/2015 12:34   Mr Brain Wo Contrast  Result Date: 12/23/2015 CLINICAL DATA:  Acute onset hand tremor and cramping. Abnormal tongue and RIGHT facial sensation for 15-20 minutes. Assess transient ischemic attack. History of hypertension, breast cancer, endometrial cancer, atrial fibrillation. EXAM: MRI HEAD WITHOUT CONTRAST MRA HEAD WITHOUT CONTRAST TECHNIQUE: Multiplanar, multiecho pulse sequences of the brain and surrounding structures were obtained without intravenous contrast. Angiographic images of the head were obtained using MRA technique without contrast. COMPARISON:  CT HEAD December 22, 2015 and MRI head May 21, 2009 FINDINGS: MRI HEAD FINDINGS-  multiple sequences are moderately motion degraded. BRAIN: No reduced diffusion to suggest acute ischemia. No susceptibility artifact to suggest hemorrhage. The ventricles and sulci are normal for patient's age. Scattered subcentimeter supratentorial white matter FLAIR T2 hyperintensities are less than expected for age. Old small RIGHT cerebellar infarct, old RIGHT thalamus lacunar infarct. No suspicious parenchymal signal, masses or mass effect. No abnormal extra-axial fluid collections. No extra-axial masses though, contrast enhanced sequences would be more sensitive. VASCULAR: Normal major intracranial vascular flow voids present at skull base. SKULL AND UPPER CERVICAL SPINE: No abnormal sellar expansion. No suspicious calvarial bone marrow signal. Craniocervical junction maintained. SINUSES/ORBITS: Trace LEFT mastoid effusion. Paranasal sinuses are well-aerated. Status post bilateral ocular lens implants. The included ocular globes and orbital contents are non-suspicious. OTHER: None. MRA HEAD FINDINGS- moderate to severely moderately motion degraded examination. ANTERIOR CIRCULATION: Flow related enhancement bilateral internal carotid arteries, anterior middle cerebral arteries. Degree of motion results in duplicated appearance of the intracranial vessels. Stenosis, luminal irregularity or aneurysm cannot be excluded due to patient motion. POSTERIOR CIRCULATION: Flow related enhancement  bilateral vertebral arteries, basilar artery and posterior cerebral arteries. Stenosis, luminal irregularity or aneurysm cannot be excluded due to patient motion. IMPRESSION: MRI HEAD: No acute intracranial process on this moderately motion degraded examination. 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 Mild chronic small vessel ischemic disease. MRA HEAD: No emergent large vessel occlusion on this motion degraded examination. Electronically Signed   By: Elon Alas M.D.   On: 12/23/2015  02:55   Mr Jodene Nam Head/brain [id number] Cm  Result Date: 12/23/2015 CLINICAL DATA:  Acute onset hand tremor and cramping. Abnormal tongue and RIGHT facial sensation for 15-20 minutes. Assess transient ischemic attack. History of hypertension, breast cancer, endometrial cancer, atrial fibrillation. EXAM: MRI HEAD WITHOUT CONTRAST MRA HEAD WITHOUT CONTRAST TECHNIQUE: Multiplanar, multiecho pulse sequences of the brain and surrounding structures were obtained without intravenous contrast. Angiographic images of the head were obtained using MRA technique without contrast. COMPARISON:  CT HEAD December 22, 2015 and MRI head May 21, 2009 FINDINGS: MRI HEAD FINDINGS- multiple sequences are moderately motion degraded. BRAIN: No reduced diffusion to suggest acute ischemia. No susceptibility artifact to suggest hemorrhage. The ventricles and sulci are normal for patient's age. Scattered subcentimeter supratentorial white matter FLAIR T2 hyperintensities are less than expected for age. Old small RIGHT cerebellar infarct, old RIGHT thalamus lacunar infarct. No suspicious parenchymal signal, masses or mass effect. No abnormal extra-axial fluid collections. No extra-axial masses though, contrast enhanced sequences would be more sensitive. VASCULAR: Normal major intracranial vascular flow voids present at skull base. SKULL AND UPPER CERVICAL SPINE: No abnormal sellar expansion. No suspicious calvarial bone marrow signal. Craniocervical junction maintained. SINUSES/ORBITS: Trace LEFT mastoid effusion. Paranasal sinuses are well-aerated. Status post bilateral ocular lens implants. The included ocular globes and orbital contents are non-suspicious. OTHER: None. MRA HEAD FINDINGS- moderate to severely moderately motion degraded examination. ANTERIOR CIRCULATION: Flow related enhancement bilateral internal carotid arteries, anterior middle cerebral arteries. Degree of motion results in duplicated appearance of the intracranial vessels.  Stenosis, luminal irregularity or aneurysm cannot be excluded due to patient motion. POSTERIOR CIRCULATION: Flow related enhancement bilateral vertebral arteries, basilar artery and posterior cerebral arteries. Stenosis, luminal irregularity or aneurysm cannot be excluded due to patient motion. IMPRESSION: MRI HEAD: No acute intracranial process on this moderately motion degraded examination. 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 Mild chronic small vessel ischemic disease. MRA HEAD: No emergent large vessel occlusion on this motion degraded examination. Electronically Signed   By: Elon Alas M.D.   On: 12/23/2015 02:55   Ct Head Code Stroke W/o Cm  Addendum Date: 12/22/2015   ADDENDUM REPORT: 12/22/2015 22:42 ADDENDUM: These results were called by telephone at the time of interpretation on 12/22/2015 at 10:41 pm to Dr. Cheral Marker, who verbally acknowledged these results. Electronically Signed   By: Kristine Garbe M.D.   On: 12/22/2015 22:42   Result Date: 12/22/2015 CLINICAL DATA:  Code stroke.  Right-sided weakness and numbness. EXAM: CT HEAD WITHOUT CONTRAST TECHNIQUE: Contiguous axial images were obtained from the base of the skull through the vertex without intravenous contrast. COMPARISON:  03/12/2015 CT head. FINDINGS: Brain: No evidence of acute infarction, hemorrhage, hydrocephalus, extra-axial collection or mass lesion/mass effect. Few foci of hypoattenuation within subcortical white matter compatible with mild chronic microvascular ischemic changes and stable. Mild brain parenchymal volume loss. Vascular: No hyperdense vessel. Unchanged calcifications at the tip of basilar, bilateral vertebral arteries, and within the internal carotid arteries. Skull: Normal. Negative for fracture or focal lesion. Sinuses/Orbits: No  acute finding. Bilateral intra-ocular lens replacement. Other: None. ASPECTS [facility] Stroke Program Early CT Score) - Ganglionic level  infarction (caudate, lentiform nuclei, internal capsule, insula, M1-M3 cortex): 7 - Supraganglionic infarction (M4-M6 cortex): 3 Total score (0-10 with 10 being normal): 10 IMPRESSION: 1. No acute intracranial abnormality identified. If symptoms persist or if clinically indicated MRI is more sensitive for acute stroke. 2. Mild chronic microvascular ischemic changes, parenchymal volume loss, and intracranial calcific atherosclerosis. 3. ASPECTS is 10 Electronically Signed: By: Kristine Garbe M.D. On: 12/22/2015 22:38      TODAY-DAY OF DISCHARGE:  Subjective:   Sheri Hensley today has no headache,no chest abdominal pain,no new weakness tingling or numbness, feels much better wants to go home today.   Objective:   Blood pressure (!) 141/51, pulse 65, temperature 98.2 F (36.8 C), temperature source Oral, resp. rate 16, height 5\' 4"  (1.626 m), weight 58.5 kg (128 lb 15.5 oz), SpO2 98 %.  Intake/Output Summary (Last 24 hours) at 12/23/15 1627 Last data filed at 12/23/15 0503  Gross per 24 hour  Intake              330 ml  Output                0 ml  Net              330 ml   Filed Weights   12/23/15 0323  Weight: 58.5 kg (128 lb 15.5 oz)    Exam: Awake Alert, Oriented *3, No new F.N deficits, Normal affect [place].AT,PERRAL Supple Neck,No JVD, No cervical lymphadenopathy appriciated.  Symmetrical Chest wall movement, Good air movement bilaterally, CTAB RRR,No Gallops,Rubs or new Murmurs, No Parasternal Heave +ve B.Sounds, Abd Soft, Non tender, No organomegaly appriciated, No rebound -guarding or rigidity. No Cyanosis, Clubbing or edema, No new Rash or bruise   PERTINENT RADIOLOGIC STUDIES: Dg Chest 2 View  Result Date: 12/15/2015 CLINICAL DATA:  82 year old female preop hysterectomy for endometrial cancer EXAM: CHEST  2 VIEW COMPARISON:  03/15/2015, 03/13/2015, CT chest 03/14/2015 FINDINGS: Cardiomediastinal silhouette unchanged in size and contour. No evidence of  central vascular congestion. Surgical changes of left mastectomy and nodal dissection of the left axilla. Partially healed fracture at the rib angle of left seventh and eighth ribs. No pleural effusion. No pneumothorax. No confluent airspace disease. Calcifications of the aortic arch with tortuosity of the thoracic aorta. IMPRESSION: Chronic lung changes without evidence of superimposed acute cardiopulmonary disease. Partially healed left-sided fractures of the seventh and eighth ribs, unchanged from prior. Aortic atherosclerosis. Signed, Dulcy Fanny. Earleen Newport, DO [facility] Radiology Electronically Signed   By: Corrie Mckusick D.O.   On: 12/15/2015 12:34   Mr Brain Wo Contrast  Result Date: 12/23/2015 CLINICAL DATA:  Acute onset hand tremor and cramping. Abnormal tongue and RIGHT facial sensation for 15-20 minutes. Assess transient ischemic attack. History of hypertension, breast cancer, endometrial cancer, atrial fibrillation. EXAM: MRI HEAD WITHOUT CONTRAST MRA HEAD WITHOUT CONTRAST TECHNIQUE: Multiplanar, multiecho pulse sequences of the brain and surrounding structures were obtained without intravenous contrast. Angiographic images of the head were obtained using MRA technique without contrast. COMPARISON:  CT HEAD December 22, 2015 and MRI head May 21, 2009 FINDINGS: MRI HEAD FINDINGS- multiple sequences are moderately motion degraded. BRAIN: No reduced diffusion to suggest acute ischemia. No susceptibility artifact to suggest hemorrhage. The ventricles and sulci are normal for patient's age. Scattered subcentimeter supratentorial white matter FLAIR T2 hyperintensities are less than expected for age.  Old small RIGHT cerebellar infarct, old RIGHT thalamus lacunar infarct. No suspicious parenchymal signal, masses or mass effect. No abnormal extra-axial fluid collections. No extra-axial masses though, contrast enhanced sequences would be more sensitive. VASCULAR: Normal  major intracranial vascular flow voids present at skull base. SKULL AND UPPER CERVICAL SPINE: No abnormal sellar expansion. No suspicious calvarial bone marrow signal. Craniocervical junction maintained. SINUSES/ORBITS: Trace LEFT mastoid effusion. Paranasal sinuses are well-aerated. Status post bilateral ocular lens implants. The included ocular globes and orbital contents are non-suspicious. OTHER: None. MRA HEAD FINDINGS- moderate to severely moderately motion degraded examination. ANTERIOR CIRCULATION: Flow related enhancement bilateral internal carotid arteries, anterior middle cerebral arteries. Degree of motion results in duplicated appearance of the intracranial vessels. Stenosis, luminal irregularity or aneurysm cannot be excluded due to patient motion. POSTERIOR CIRCULATION: Flow related enhancement bilateral vertebral arteries, basilar artery and posterior cerebral arteries. Stenosis, luminal irregularity or aneurysm cannot be excluded due to patient motion. IMPRESSION: MRI HEAD: No acute intracranial process on this moderately motion degraded examination. 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 Mild chronic small vessel ischemic disease. MRA HEAD: No emergent large vessel occlusion on this motion degraded examination. Electronically Signed   By: Elon Alas M.D.   On: 12/23/2015 02:55   Mr Jodene Nam Head/brain [id number] Cm  Result Date: 12/23/2015 CLINICAL DATA:  Acute onset hand tremor and cramping. Abnormal tongue and RIGHT facial sensation for 15-20 minutes. Assess transient ischemic attack. History of hypertension, breast cancer, endometrial cancer, atrial fibrillation. EXAM: MRI HEAD WITHOUT CONTRAST MRA HEAD WITHOUT CONTRAST TECHNIQUE: Multiplanar, multiecho pulse sequences of the brain and surrounding structures were obtained without intravenous contrast. Angiographic images of the head were obtained using MRA technique without contrast. COMPARISON:  CT HEAD  December 22, 2015 and MRI head May 21, 2009 FINDINGS: MRI HEAD FINDINGS- multiple sequences are moderately motion degraded. BRAIN: No reduced diffusion to suggest acute ischemia. No susceptibility artifact to suggest hemorrhage. The ventricles and sulci are normal for patient's age. Scattered subcentimeter supratentorial white matter FLAIR T2 hyperintensities are less than expected for age. Old small RIGHT cerebellar infarct, old RIGHT thalamus lacunar infarct. No suspicious parenchymal signal, masses or mass effect. No abnormal extra-axial fluid collections. No extra-axial masses though, contrast enhanced sequences would be more sensitive. VASCULAR: Normal major intracranial vascular flow voids present at skull base. SKULL AND UPPER CERVICAL SPINE: No abnormal sellar expansion. No suspicious calvarial bone marrow signal. Craniocervical junction maintained. SINUSES/ORBITS: Trace LEFT mastoid effusion. Paranasal sinuses are well-aerated. Status post bilateral ocular lens implants. The included ocular globes and orbital contents are non-suspicious. OTHER: None. MRA HEAD FINDINGS- moderate to severely moderately motion degraded examination. ANTERIOR CIRCULATION: Flow related enhancement bilateral internal carotid arteries, anterior middle cerebral arteries. Degree of motion results in duplicated appearance of the intracranial vessels. Stenosis, luminal irregularity or aneurysm cannot be excluded due to patient motion. POSTERIOR CIRCULATION: Flow related enhancement bilateral vertebral arteries, basilar artery and posterior cerebral arteries. Stenosis, luminal irregularity or aneurysm cannot be excluded due to patient motion. IMPRESSION: MRI HEAD: No acute intracranial process on this moderately motion degraded examination. 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 Mild chronic small vessel ischemic disease. MRA HEAD: No emergent large vessel occlusion on this motion degraded examination.  Electronically Signed   By: Elon Alas M.D.   On: 12/23/2015 02:55   Ct Head Code Stroke W/o Cm  Addendum Date: 12/22/2015   ADDENDUM REPORT: 12/22/2015 22:42 ADDENDUM: These results were called by telephone at  the time of interpretation on 12/22/2015 at 10:41 pm to Dr. Cheral Marker, who verbally acknowledged these results. Electronically Signed   By: Kristine Garbe M.D.   On: 12/22/2015 22:42   Result Date: 12/22/2015 CLINICAL DATA:  Code stroke.  Right-sided weakness and numbness. EXAM: CT HEAD WITHOUT CONTRAST TECHNIQUE: Contiguous axial images were obtained from the base of the skull through the vertex without intravenous contrast. COMPARISON:  03/12/2015 CT head. FINDINGS: Brain: No evidence of acute infarction, hemorrhage, hydrocephalus, extra-axial collection or mass lesion/mass effect. Few foci of hypoattenuation within subcortical white matter compatible with mild chronic microvascular ischemic changes and stable. Mild brain parenchymal volume loss. Vascular: No hyperdense vessel. Unchanged calcifications at the tip of basilar, bilateral vertebral arteries, and within the internal carotid arteries. Skull: Normal. Negative for fracture or focal lesion. Sinuses/Orbits: No acute finding. Bilateral intra-ocular lens replacement. Other: None. ASPECTS [facility] Stroke Program Early CT Score) - Ganglionic level infarction (caudate, lentiform nuclei, internal capsule, insula, M1-M3 cortex): 7 - Supraganglionic infarction (M4-M6 cortex): 3 Total score (0-10 with 10 being normal): 10 IMPRESSION: 1. No acute intracranial abnormality identified. If symptoms persist or if clinically indicated MRI is more sensitive for acute stroke. 2. Mild chronic microvascular ischemic changes, parenchymal volume loss, and intracranial calcific atherosclerosis. 3. ASPECTS is 10 Electronically Signed: By: Kristine Garbe M.D. On: 12/22/2015 22:38     PERTINENT LAB RESULTS: CBC:  Recent Labs   12/22/15 2221 12/22/15 2230  WBC 7.7  --   HGB 12.7 13.9  HCT 38.3 41.0  PLT 136*  --    CMET CMP     Component Value Date/Time   NA 136 12/22/2015 2230   K 4.3 12/22/2015 2230   CL 101 12/22/2015 2230   CO2 22 12/22/2015 2221   GLUCOSE 97 12/22/2015 2230   BUN 23 (H) 12/22/2015 2230   CREATININE 1.10 (H) 12/22/2015 2230   CALCIUM 9.7 12/22/2015 2221   PROT 6.9 12/22/2015 2221   ALBUMIN 4.4 12/22/2015 2221   AST 30 12/22/2015 2221   ALT 26 12/22/2015 2221   ALKPHOS 63 12/22/2015 2221   BILITOT 1.5 (H) 12/22/2015 2221   BILITOT 1.6 (H) 12/22/2015 2221   GFRNONAA 40 (L) 12/22/2015 2221   GFRAA 46 (L) 12/22/2015 2221    GFR Estimated Creatinine Clearance: 27 mL/min (by C-G formula based on SCr of 1.1 mg/dL (H)). No results for input(s): LIPASE, AMYLASE in the last 72 hours. No results for input(s): CKTOTAL, CKMB, CKMBINDEX, TROPONINI in the last 72 hours. Invalid input(s): POCBNP No results for input(s): DDIMER in the last 72 hours. No results for input(s): HGBA1C in the last 72 hours.  Recent Labs  12/23/15 0425  CHOL 132  HDL 41  LDLCALC 65  TRIG 130  CHOLHDL 3.2   No results for input(s): TSH, T4TOTAL, T3FREE, THYROIDAB in the last 72 hours.  Invalid input(s): FREET3 No results for input(s): VITAMINB12, FOLATE, FERRITIN, TIBC, IRON, RETICCTPCT in the last 72 hours. Coags:  Recent Labs  12/22/15 2221  INR 1.15   Microbiology: No results found for this or any previous visit (from the past 240 hour(s)).  FURTHER DISCHARGE INSTRUCTIONS:  Get Medicines reviewed and adjusted: Please take all your medications with you for your next visit with your Primary MD  Laboratory/radiological data: Please request your Primary MD to go over all hospital tests and procedure/radiological results at the follow up, please ask your Primary MD to get all Hospital records sent to his/her office.  In some cases, they  will be blood work, cultures and biopsy results pending  at the time of your discharge. Please request that your primary care M.D. goes through all the records of your hospital data and follows up on these results.  Also Note the following: If you experience worsening of your admission symptoms, develop shortness of breath, life threatening emergency, suicidal or homicidal thoughts you must seek medical attention immediately by calling 911 or calling your MD immediately  if symptoms less severe.  You must read complete instructions/literature along with all the possible adverse reactions/side effects for all the Medicines you take and that have been prescribed to you. Take any new Medicines after you have completely understood and accpet all the possible adverse reactions/side effects.   Do not drive when taking Pain medications or sleeping medications (Benzodaizepines)  Do not take more than prescribed Pain, Sleep and Anxiety Medications. It is not advisable to combine anxiety,sleep and pain medications without talking with your primary care practitioner  Special Instructions: If you have smoked or chewed Tobacco  in the last 2 yrs please stop smoking, stop any regular Alcohol  and or any Recreational drug use.  Wear Seat belts while driving.  Please note: You were cared for by a hospitalist during your hospital stay. Once you are discharged, your primary care physician will handle any further medical issues. Please note that NO REFILLS for any discharge medications will be authorized once you are discharged, as it is imperative that you return to your primary care physician (or establish a relationship with a primary care physician if you do not have one) for your post hospital discharge needs so that they can reassess your need for medications and monitor your lab values.  Total Time spent coordinating discharge including counseling, education and face to face time equals 35 minutes.  SignedOren Binet 12/23/2015 4:27 PM

## 2015-12-23 NOTE — H&P (Signed)
History and Physical  Patient Name: Sheri Hensley     [id number]    DOB: 1919/08/06    DOA: 12/22/2015 PCP: Angelina Sheriff., MD   Patient coming from: Home     Chief Complaint: Right sided numbness, weakness  HPI: Sheri Hensley is a 82 y.o. female with a past medical history significant for HTN, Afib on amiodarone not Afib, possible TIA, CAD and recently diagnosed endometrial cancer who presents with transient right sided weakness.  Tonight, after supper, the patient had showered and was getting ready for bed, when she was sitting and noticed her right hand "quivering", and she was unable to move it. She also noticed an abnormal sensation in her right leg, and right face, and called her daughter. Her daughter called 9-1-1 and went through the stroke protocol by phone, and reports that her right face was drooping.  All of her symptoms resolved within about 20 minutes.  ED course: -Heart rate 60s, respirations and pulse is normal, blood pressure 200/81 -Na 134, K 3.8, Cr 1.14 (baseline 1.0), WBC 7.7 K, Hgb 12.7 -Coags within normal limits, troponin negative -CT head showed only microvascular change -She was evaluated by neurology who recommended TIA workup   The patient has a history in her chart of Atrial fibrillation, by Dr. Geraldo Pitter at [facility]. On amiodarone, but not anticoagulation, unclear why not.  She was recently worked up for abnormal vaginal bleeding and found to have endometrial cancer.  She was actually scheduled to undergo hysterectomy by Dr. Denman George today.        Review of systems:  Review of Systems  Neurological: Positive for sensory change, focal weakness and headaches. Negative for dizziness, speech change, seizures and loss of consciousness.  All other systems reviewed and are negative.        Past Medical History:  Diagnosis Date  . Cancer ([place]) 1998   left tamoxifem x 5 yrs  . Coronary artery disease    CARDIOLOGIST-  DR  Geraldo Pitter ([facility])  . Endometrial cancer ([place])   . GERD (gastroesophageal reflux disease)   . Glaucoma, both eyes   . History of breast cancer    1998--  s/p  left mastectomy with sln dissection's  ,  no chemo or radiation-  no recurrence  . History of Clostridium difficile infection 5-6 yrs ago  . History of TIAs    yrs ago ?   Marland Kitchen Hyperlipidemia   . Hypertension   . Macular degeneration   . Narrow complex tachycardia [facility])    cardiologist-  dr Salley Scarlet  . OA (osteoarthritis)    right knee  . PMB (postmenopausal bleeding)   . PONV (postoperative nausea and vomiting)    ponv after appendectomy 1954  . Thickened endometrium   . Wears glasses     Past Surgical History:  Procedure Laterality Date  . APPENDECTOMY  1954  . CATARACT EXTRACTION W/ INTRAOCULAR LENS  IMPLANT, BILATERAL  1994  . colonscopy    . DILATION AND CURETTAGE OF UTERUS N/A 11/20/2015   Procedure: DILATATION AND CURETTAGE;  Surgeon: Everitt Amber, MD;  Location: [facility];  Service: Gynecology;  Laterality: N/A;  . DILATION AND CURETTAGE, DIAGNOSTIC / THERAPEUTIC N/A novmber 2017   per daughter with biopsy as stated  . KNEE ARTHROSCOPY Right 2002  . MASTECTOMY Left 1998   w/ lympn node dissection's    Social History: Patient lives alone.  Patient walks unassistd.  She still has her driver's  license.  She does not smoke.  She has no hsitory of dementia.    Allergies  Allergen Reactions  . Flagyl [Metronidazole] Rash    Family history: family history includes Hypertension in her father and mother.  Prior to Admission medications   Medication Sig Start Date End Date Taking? Authorizing Provider  acetaminophen (TYLENOL) 500 MG tablet Take 500 mg by mouth every 6 (six) hours as needed for moderate pain or headache.   Yes Historical Provider, MD  ALPRAZolam (XANAX) 0.25 MG tablet Take 0.125-0.25 mg by mouth daily as needed for anxiety.    Yes Historical Provider, MD  amiodarone  (PACERONE) 200 MG tablet Take 100 mg by mouth every morning.  10/31/15  Yes Historical Provider, MD  atorvastatin (LIPITOR) 10 MG tablet Take 10 mg by mouth at bedtime.   Yes Historical Provider, MD  betamethasone acetate-betamethasone sodium phosphate (CELESTONE) 6 (3-3) MG/ML injection Inject 12 mg into the articular space every 3 (three) months. 08/07/15  Yes Historical Provider, MD  bimatoprost (LUMIGAN) 0.01 % SOLN Place 1 drop into both eyes at bedtime.   Yes Historical Provider, MD  brimonidine (ALPHAGAN P) 0.1 % SOLN Place 1 drop into both eyes every 8 (eight) hours.  03/12/13  Yes Historical Provider, MD  cyanocobalamin (V-R VITAMIN B-12) 500 MCG tablet Take 500 mcg by mouth daily.    Yes Historical Provider, MD  lisinopril (PRINIVIL,ZESTRIL) 10 MG tablet Take 10 mg by mouth every morning.    Yes Historical Provider, MD  loratadine (CLARITIN) 10 MG tablet Take 10 mg by mouth daily as needed for allergies.   Yes Historical Provider, MD  Melatonin 10 MG CAPS Take 10 mg by mouth at bedtime.    Yes Historical Provider, MD  metoprolol tartrate (LOPRESSOR) 25 MG tablet Take 25 mg by mouth 2 (two) times daily.    Yes Historical Provider, MD  Multiple Vitamins-Minerals (CENTRUM VITAMINTS PO) Take 1 tablet by mouth every morning.   Yes Historical Provider, MD  Multiple Vitamins-Minerals (ICAPS AREDS 2) CAPS Take 1 tablet by mouth daily.   Yes Historical Provider, MD  omeprazole (PRILOSEC) 20 MG capsule Take 20 mg by mouth every morning. 08/28/15  Yes Historical Provider, MD  sertraline (ZOLOFT) 50 MG tablet Take 50 mg by mouth daily.   Yes Historical Provider, MD     Physical Exam: BP (!) 163/55 (BP Location: Right Arm)   Pulse 69   Temp 97.9 F (36.6 C) (Oral)   Resp 16   Ht 5\' 4"  (1.626 m)   Wt 58.5 kg (128 lb 15.5 oz)   SpO2 94%   BMI 22.14 kg/m  General appearance: Well-developed, elderly adult female, alert and in no acute distress.   Eyes: Anicteric, conjunctiva pink, lids and lashes  normal. PERRL.    ENT: No nasal deformity, discharge, epistaxis.  Hearing normal. OP moist without lesions. Lymph: No cervical, supraclavicular or axillary lymphadenopathy. Skin: Warm and dry.  No jaundice.  No suspicious rashes or lesions. Cardiac: RRR, nl S1-S2, no murmurs appreciated.  Capillary refill is brisk.  JVP normal.  No LE edema.  Radial and DP pulses 2+ and symmetric.  No carotid bruits. Respiratory: Normal respiratory rate and rhythm.  CTAB without rales or wheezes. GI: Abdomen soft without rigidity.  No TTP. No ascites, distension, no hepatosplenomegaly.   MSK: No deformities or effusions. Neuro: Pupils are 4 mm and reactive to 3 mm. Extraocular movements are intact, without nystagmus. Cranial nerve 5 is within normal limits. Cranial nerve  7 is symmetrical. Cranial nerve 8 is within normal limits. Cranial nerves 9 and 10 reveal equal palate elevation. Cranial nerve 11 reveals sternocleidomastoid strong. Cranial nerve 12 is midline. I do not note a deficit in motor strength testing in the upper and lower extremities bilaterally with normal motor, tone and bulk. Romberg maneuver is negative for pathology. Finger-to-nose testing is within normal limits. Speech is fluent. Naming is grossly intact. Attention span and concentration are within normal limits.   Psych: The patient is oriented to time, place and person. Behavior appropriate.  Affect pleasant.  Recall, recent and remote, as well as general fund of knowledge seem within normal limits. No evidence of aural or visual hallucinations or delusions.       Labs on Admission:  I have personally reviewed following labs and imaging studies: CBC:  Recent Labs Lab 12/22/15 2221 12/22/15 2230  WBC 7.7  --   NEUTROABS 5.7  --   HGB 12.7 13.9  HCT 38.3 41.0  MCV 90.1  --   PLT 136*  --    Basic Metabolic Panel:  Recent Labs Lab 12/22/15 2221 12/22/15 2230  NA 134* 136  K 3.8 4.3  CL 100* 101  CO2 22  --     GLUCOSE 110* 97  BUN 19 23*  CREATININE 1.14* 1.10*  CALCIUM 9.7  --    GFR: Estimated Creatinine Clearance: 27 mL/min (by C-G formula based on SCr of 1.1 mg/dL (H)). Liver Function Tests:  Recent Labs Lab 12/22/15 2221  AST 30  ALT 26  ALKPHOS 63  BILITOT 1.6*  1.5*  PROT 6.9  ALBUMIN 4.4   No results for input(s): LIPASE, AMYLASE in the last 168 hours. No results for input(s): AMMONIA in the last 168 hours. Coagulation Profile:  Recent Labs Lab 12/22/15 2221  INR 1.15   Cardiac Enzymes: No results for input(s): CKTOTAL, CKMB, CKMBINDEX, TROPONINI in the last 168 hours. BNP (last 3 results) No results for input(s): PROBNP in the last 8760 hours. HbA1C: No results for input(s): HGBA1C in the last 72 hours. CBG:  Recent Labs Lab 12/22/15 2219  GLUCAP 98   Lipid Profile:  Recent Labs  12/23/15 0425  CHOL 132  HDL 41  LDLCALC 65  TRIG 130  CHOLHDL 3.2   Thyroid Function Tests: No results for input(s): TSH, T4TOTAL, FREET4, T3FREE, THYROIDAB in the last 72 hours. Anemia Panel: No results for input(s): VITAMINB12, FOLATE, FERRITIN, TIBC, IRON, RETICCTPCT in the last 72 hours. Sepsis Labs: Invalid input(s): PROCALCITONIN, LACTICIDVEN No results found for this or any previous visit (from the past 240 hour(s)).    Radiological Exams on Admission: Personally reviewed CT and MR reports: Mr Brain Wo Contrast  Result Date: 12/23/2015 CLINICAL DATA:  Acute onset hand tremor and cramping. Abnormal tongue and RIGHT facial sensation for 15-20 minutes. Assess transient ischemic attack. History of hypertension, breast cancer, endometrial cancer, atrial fibrillation. EXAM: MRI HEAD WITHOUT CONTRAST MRA HEAD WITHOUT CONTRAST TECHNIQUE: Multiplanar, multiecho pulse sequences of the brain and surrounding structures were obtained without intravenous contrast. Angiographic images of the head were obtained using MRA technique without contrast. COMPARISON:  CT HEAD December 22, 2015 and MRI head May 21, 2009 FINDINGS: MRI HEAD FINDINGS- multiple sequences are moderately motion degraded. BRAIN: No reduced diffusion to suggest acute ischemia. No susceptibility artifact to suggest hemorrhage. The ventricles and sulci are normal for patient's age. Scattered subcentimeter supratentorial white matter FLAIR T2 hyperintensities are less than expected for age. Old small RIGHT  cerebellar infarct, old RIGHT thalamus lacunar infarct. No suspicious parenchymal signal, masses or mass effect. No abnormal extra-axial fluid collections. No extra-axial masses though, contrast enhanced sequences would be more sensitive. VASCULAR: Normal major intracranial vascular flow voids present at skull base. SKULL AND UPPER CERVICAL SPINE: No abnormal sellar expansion. No suspicious calvarial bone marrow signal. Craniocervical junction maintained. SINUSES/ORBITS: Trace LEFT mastoid effusion. Paranasal sinuses are well-aerated. Status post bilateral ocular lens implants. The included ocular globes and orbital contents are non-suspicious. OTHER: None. MRA HEAD FINDINGS- moderate to severely moderately motion degraded examination. ANTERIOR CIRCULATION: Flow related enhancement bilateral internal carotid arteries, anterior middle cerebral arteries. Degree of motion results in duplicated appearance of the intracranial vessels. Stenosis, luminal irregularity or aneurysm cannot be excluded due to patient motion. POSTERIOR CIRCULATION: Flow related enhancement bilateral vertebral arteries, basilar artery and posterior cerebral arteries. Stenosis, luminal irregularity or aneurysm cannot be excluded due to patient motion. IMPRESSION: MRI HEAD: No acute intracranial process on this moderately motion degraded examination. 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 Mild chronic small vessel ischemic disease. MRA HEAD: No emergent large vessel occlusion on this motion degraded examination.  Electronically Signed   By: Elon Alas M.D.   On: 12/23/2015 02:55   Mr Jodene Nam Head/brain [id number] Cm  Result Date: 12/23/2015 CLINICAL DATA:  Acute onset hand tremor and cramping. Abnormal tongue and RIGHT facial sensation for 15-20 minutes. Assess transient ischemic attack. History of hypertension, breast cancer, endometrial cancer, atrial fibrillation. EXAM: MRI HEAD WITHOUT CONTRAST MRA HEAD WITHOUT CONTRAST TECHNIQUE: Multiplanar, multiecho pulse sequences of the brain and surrounding structures were obtained without intravenous contrast. Angiographic images of the head were obtained using MRA technique without contrast. COMPARISON:  CT HEAD December 22, 2015 and MRI head May 21, 2009 FINDINGS: MRI HEAD FINDINGS- multiple sequences are moderately motion degraded. BRAIN: No reduced diffusion to suggest acute ischemia. No susceptibility artifact to suggest hemorrhage. The ventricles and sulci are normal for patient's age. Scattered subcentimeter supratentorial white matter FLAIR T2 hyperintensities are less than expected for age. Old small RIGHT cerebellar infarct, old RIGHT thalamus lacunar infarct. No suspicious parenchymal signal, masses or mass effect. No abnormal extra-axial fluid collections. No extra-axial masses though, contrast enhanced sequences would be more sensitive. VASCULAR: Normal major intracranial vascular flow voids present at skull base. SKULL AND UPPER CERVICAL SPINE: No abnormal sellar expansion. No suspicious calvarial bone marrow signal. Craniocervical junction maintained. SINUSES/ORBITS: Trace LEFT mastoid effusion. Paranasal sinuses are well-aerated. Status post bilateral ocular lens implants. The included ocular globes and orbital contents are non-suspicious. OTHER: None. MRA HEAD FINDINGS- moderate to severely moderately motion degraded examination. ANTERIOR CIRCULATION: Flow related enhancement bilateral internal carotid arteries, anterior middle cerebral arteries. Degree of motion  results in duplicated appearance of the intracranial vessels. Stenosis, luminal irregularity or aneurysm cannot be excluded due to patient motion. POSTERIOR CIRCULATION: Flow related enhancement bilateral vertebral arteries, basilar artery and posterior cerebral arteries. Stenosis, luminal irregularity or aneurysm cannot be excluded due to patient motion. IMPRESSION: MRI HEAD: No acute intracranial process on this moderately motion degraded examination. 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 Mild chronic small vessel ischemic disease. MRA HEAD: No emergent large vessel occlusion on this motion degraded examination. Electronically Signed   By: Elon Alas M.D.   On: 12/23/2015 02:55   Ct Head Code Stroke W/o Cm  Addendum Date: 12/22/2015   ADDENDUM REPORT: 12/22/2015 22:42 ADDENDUM: These results were called by telephone at the time of interpretation  on 12/22/2015 at 10:41 pm to Dr. Cheral Marker, who verbally acknowledged these results. Electronically Signed   By: Kristine Garbe M.D.   On: 12/22/2015 22:42   Result Date: 12/22/2015 CLINICAL DATA:  Code stroke.  Right-sided weakness and numbness. EXAM: CT HEAD WITHOUT CONTRAST TECHNIQUE: Contiguous axial images were obtained from the base of the skull through the vertex without intravenous contrast. COMPARISON:  03/12/2015 CT head. FINDINGS: Brain: No evidence of acute infarction, hemorrhage, hydrocephalus, extra-axial collection or mass lesion/mass effect. Few foci of hypoattenuation within subcortical white matter compatible with mild chronic microvascular ischemic changes and stable. Mild brain parenchymal volume loss. Vascular: No hyperdense vessel. Unchanged calcifications at the tip of basilar, bilateral vertebral arteries, and within the internal carotid arteries. Skull: Normal. Negative for fracture or focal lesion. Sinuses/Orbits: No acute finding. Bilateral intra-ocular lens replacement. Other: None.  ASPECTS [facility] Stroke Program Early CT Score) - Ganglionic level infarction (caudate, lentiform nuclei, internal capsule, insula, M1-M3 cortex): 7 - Supraganglionic infarction (M4-M6 cortex): 3 Total score (0-10 with 10 being normal): 10 IMPRESSION: 1. No acute intracranial abnormality identified. If symptoms persist or if clinically indicated MRI is more sensitive for acute stroke. 2. Mild chronic microvascular ischemic changes, parenchymal volume loss, and intracranial calcific atherosclerosis. 3. ASPECTS is 10 Electronically Signed: By: Kristine Garbe M.D. On: 12/22/2015 22:38     EKG: Independently reviewed. Rate 65, QTc 475, normal sinus rhythm.       Assessment/Plan Active Problems:   TIA (transient ischemic attack)   Essential hypertension   Paroxysmal atrial fibrillation (HCC)   Hyponatremia   Elevated bilirubin  1. Acute suspected TIA:  This is new.  MRI shows old infarcts, no acute infarct.  ABCD2 score 5. -Admit to telemetry -Neuro checks, NIHSS per protocol -Restart Plavix -Permissive hypertension for now -Lipids, hemoglobin A1c -Carotid doppler, MRA or CT angiography of head and neck per Neurology, ordered -Echocardiogram ordered -PT/OT/SLP consultation -Consult to Neurology, appreciate recommendations   2. History of Afib:  CHADS2Vasc 6, not previously on anticoagulation, unclear why. -Continue amiodarone  3. HTN:  -Permissive hypertension for now, hold lisinopril and metoprolol -Continue statin  4. Hyponatremia:  Appears euvolemic. -Check urine sodium and uOsm/sOsm  5. Elevated total bilirubin:  Clinical significance unclear.  -Check fractionated bili  6. Other medications:  -Continue eye drops -Continue sertraline      DVT prophylaxis: Lovenox  Code Status: DO NOT RESUSCITATE  Family Communication: Daughter at bedside with son in law.  Overnight plan discussed.  CODE STATUS confirmed.  Disposition Plan: Anticipate Stroke work up  as above and consult to ancillary services.  Expect discharge within 1 day. Consults called: Neurology, Dr. Cheral Marker has seen patient. Admission status: Telemetry, OBS status  Core measures: -VTE prophylaxis ordered at time of admission -Aspirin ordered at admission -Atrial fibrillation: present, not anticoagulated currently -tPA not given because of resolved symptoms -Dysphagia screen ordered in ER -Lipids ordered -PT eval ordered -Non smoker    Medical decision making: Patient seen at 12:10 AM on 12/23/2015.  The patient was discussed with Dr. Tomi Bamberger. What exists of the patient's chart was reviewed in depth and summarized above.  Clinical condition: stable.       Edwin Dada [facility] Pager [phone]

## 2015-12-23 NOTE — Progress Notes (Signed)
STROKE TEAM PROGRESS NOTE   HISTORY OF PRESENT ILLNESS (per record) Sheri Hensley is an 82 y.o. female who presents with acute onset of right hand numbness and paresthesia, in addition to right face/tongue paresthesia. Also endorses transient tingling in her right calf. EMS states on exam she had mild right hand weakness. Family stated mild right facial droop as well. Recently taken off Plavix for scheduled hysterectomy tomorrow AM. Patient states she is back to baseline at time of Neurological exam, but feels anxious. Patient was not administered IV t-PA secondary to resolved symptoms. She was admitted for further evaluation and treatment.   SUBJECTIVE (INTERVAL HISTORY) Patient to daughter is at bedside. Patient feels now back to baseline. Daughter stated that on Plavix patient has some vaginal bleeding but not severe. She would like patient to be back on Plavix and she will continue monitor for bleeding. Currently, hysterectomy re-scheduled next month.   OBJECTIVE Temp:  [97.9 F (36.6 C)-98.5 F (36.9 C)] 98.1 F (36.7 C) (12/05 1130) Pulse Rate:  [63-69] 63 (12/05 1030) Cardiac Rhythm: Heart block;Sinus bradycardia;Normal sinus rhythm (12/05 0700) Resp:  [16-18] 16 (12/05 0530) BP: (142-209)/(54-81) 142/62 (12/05 1130) SpO2:  [94 %-100 %] 97 % (12/05 1130) Weight:  [58.5 kg (128 lb 15.5 oz)] 58.5 kg (128 lb 15.5 oz) (12/05 0323)  CBC:  Recent Labs Lab 12/22/15 2221 12/22/15 2230  WBC 7.7  --   NEUTROABS 5.7  --   HGB 12.7 13.9  HCT 38.3 41.0  MCV 90.1  --   PLT 136*  --     Basic Metabolic Panel:  Recent Labs Lab 12/22/15 2221 12/22/15 2230  NA 134* 136  K 3.8 4.3  CL 100* 101  CO2 22  --   GLUCOSE 110* 97  BUN 19 23*  CREATININE 1.14* 1.10*  CALCIUM 9.7  --     Lipid Panel:    Component Value Date/Time   CHOL 132 12/23/2015 0425   TRIG 130 12/23/2015 0425   HDL 41 12/23/2015 0425   CHOLHDL 3.2 12/23/2015 0425   VLDL 26 12/23/2015 0425   LDLCALC 65  12/23/2015 0425   HgbA1c: No results found for: HGBA1C Urine Drug Screen: No results found for: LABOPIA, COCAINSCRNUR, LABBENZ, AMPHETMU, THCU, LABBARB    IMAGING I have personally reviewed the radiological images below and agree with the radiology interpretations.  MRI HEAD 12/23/2015 No acute intracranial process on this moderately motion degraded examination. 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 Mild chronic small vessel ischemic disease.   MRA HEAD 12/23/2015 No emergent large vessel occlusion on this motion degraded examination.   Ct Head Code Stroke W/o Cm 12/22/2015 1. No acute intracranial abnormality identified. If symptoms persist or if clinically indicated MRI is more sensitive for acute stroke. 2. Mild chronic microvascular ischemic changes, parenchymal volume loss, and intracranial calcific atherosclerosis. 3. ASPECTS is 10   2-D echo Left ventricle: The cavity size was normal. Systolic function was   normal. The estimated ejection fraction was in the range of 60%   to 65%. Wall motion was normal; there were no regional wall   motion abnormalities. There was an increased relative   contribution of atrial contraction to ventricular filling.   Doppler parameters are consistent with abnormal left ventricular   relaxation (grade 1 diastolic dysfunction). Doppler parameters   are consistent with high ventricular filling pressure. - Aortic valve: Trileaflet; moderately thickened, moderately   calcified leaflets. There was trivial regurgitation. Valve area   (  VTI): 2.48 cm^2. Valve area (Vmax): 2.64 cm^2. Valve area   (Vmean): 2.62 cm^2. - Mitral valve: Moderately calcified annulus. Valve area by   pressure half-time: 2.29 cm^2. Valve area by continuity equation   (using LVOT flow): 2.18 cm^2. - Left atrium: The atrium was mildly dilated. - Atrial septum: There was increased thickness of the septum,   consistent with lipomatous  hypertrophy. - Pulmonary arteries: Systolic pressure could not be accurately   estimated.  Carotid Doppler Findings consistent with a 1- 39 percent stenosis involving the right internal carotid artery and the left internal carotid artery. Elevated velocities of the left external carotid artery. Bilateral vertebral arteries are patent and antegrade.   PHYSICAL EXAM Physical exam  Temp:  [97.9 F (36.6 C)-98.5 F (36.9 C)] 98.2 F (36.8 C) (12/05 1330) Pulse Rate:  [63-69] 65 (12/05 1330) Resp:  [16-18] 16 (12/05 0530) BP: (141-209)/(51-81) 141/51 (12/05 1330) SpO2:  [94 %-100 %] 98 % (12/05 1330) Weight:  [128 lb 15.5 oz (58.5 kg)] 128 lb 15.5 oz (58.5 kg) (12/05 0323)  General - Well nourished, well developed, in no apparent distress.  Ophthalmologic - Sharp disc margins OU.   Cardiovascular - Regular rate and rhythm.  Mental Status -  Level of arousal and orientation to time, place, and person were intact. Language including expression, naming, repetition, comprehension was assessed and found intact. Fund of Knowledge was assessed and was intact.  Cranial Nerves II - XII - II - Visual field intact OU. III, IV, VI - Extraocular movements intact. V - Facial sensation intact bilaterally. VII - Facial movement intact bilaterally. VIII - Hearing & vestibular intact bilaterally. X - Palate elevates symmetrically. XI - Chin turning & shoulder shrug intact bilaterally. XII - Tongue protrusion intact.  Motor Strength - The patient's strength was normal in all extremities and pronator drift was absent.  Bulk was normal and fasciculations were absent.   Motor Tone - Muscle tone was assessed at the neck and appendages and was normal.  Reflexes - The patient's reflexes were 1+ in all extremities and she had no pathological reflexes.  Sensory - Light touch, temperature/pinprick were assessed and were symmetrical.    Coordination - The patient had normal movements in the hands with  no ataxia or dysmetria.  Tremor was absent  Gait and Station - deferred.   ASSESSMENT/PLAN [name] is a 82 y.o. female with history of for HTN, Afib, possible TIA, CAD and recently diagnosed endometrial cancer presenting with right sided numbness and weakness. She did not receive IV t-PA due to resolved symptoms.   TIA - due to A. fib not on anticoagulation.  MRI  no acute infarct  MRA  no significant large vessel occlusion/stenosis  Carotid Doppler  unremarkable  2D Echo  EF 60-60%  LDL 65  HgbA1c pending  Lovenox 30 mg sq daily for VTE prophylaxis  Diet Heart Room service appropriate? Yes; Fluid consistency: Thin  clopidogrel 75 mg daily prior to admission, now on clopidogrel 75 mg daily. PTA Plavix was on hold for hysterectomy which has to be rescheduled. We recommend continue Plavix until 5 days before procedure, switch to baby aspirin, resume with post procedure, and then the follow-up with cardiology for further discussion of antiplatelet versus anticoagulation.  Patient counseled to be compliant with her antithrombotic medications  Ongoing aggressive stroke risk factor management  Therapy recommendations:  pending   Disposition:  Return home  Atrial Fibrillation  Home anticoagulation:  none, reason unclear -  cardiology reported no angulation due to frequent falls and unsteady gait. However, daughter reported only one mechanical fall for the last year.  Recommend to consider DOACs for A. fib stroke prevention after hysterectomy   Hypertension  Stable  Permissive hypertension (OK if < 220/120) but gradually normalize in 5-7 days  Long-term BP goal normotensive  Hyperlipidemia  Home meds:  lipitor 10, resumed in hospital  LDL 65, goal < 70  Continue statin at discharge  Other Stroke Risk Factors  Advanced age  ETOH use, advised to drink no more than 1 drink(s) a day  Coronary artery disease  Other Active Problems  DUB with  endometrial cancer, had been scheduled for hysterectomy today, currently rescheduled next month.  History of left breast cancer in 1998  Macular degeneration hyponatremia  elevated total bilirubin  Glaucoma, bilateral  Hospital day # 0  Neurology will sign off. Please call with questions. No neurology follow-up needed. Thanks for the consult.  Rosalin Hawking, MD PhD Stroke Neurology 12/23/2015 7:27 PM     To contact Stroke Continuity provider, please refer to [email or web address] After hours, contact General Neurology

## 2015-12-23 NOTE — Progress Notes (Signed)
Patient was discharged home with family. Discharge instructions were reviewed with the patient, and also with granddaughter. Patient and granddaughter both verbalized understanding.

## 2015-12-24 LAB — VAS US CAROTID
LCCADSYS: -57 cm/s
LCCAPDIAS: -12 cm/s
LEFT ECA DIAS: 38 cm/s
LEFT VERTEBRAL DIAS: -12 cm/s
Left CCA dist dias: -9 cm/s
Left CCA prox sys: -72 cm/s
Left ICA dist dias: -10 cm/s
Left ICA dist sys: -58 cm/s
Left ICA prox dias: -9 cm/s
Left ICA prox sys: -65 cm/s
RCCADSYS: -75 cm/s
RCCAPDIAS: 9 cm/s
RCCAPSYS: 65 cm/s
RIGHT ECA DIAS: -21 cm/s
RIGHT VERTEBRAL DIAS: -6 cm/s

## 2015-12-24 LAB — HEMOGLOBIN A1C
Hgb A1c MFr Bld: 5.6 % (ref 4.8–5.6)
Mean Plasma Glucose: 114 mg/dL

## 2015-12-25 DIAGNOSIS — H3530 Unspecified macular degeneration: Secondary | ICD-10-CM | POA: Diagnosis not present

## 2015-12-25 DIAGNOSIS — M1711 Unilateral primary osteoarthritis, right knee: Secondary | ICD-10-CM | POA: Diagnosis not present

## 2015-12-25 DIAGNOSIS — H409 Unspecified glaucoma: Secondary | ICD-10-CM | POA: Diagnosis not present

## 2015-12-25 DIAGNOSIS — Z853 Personal history of malignant neoplasm of breast: Secondary | ICD-10-CM | POA: Diagnosis not present

## 2015-12-25 DIAGNOSIS — I480 Paroxysmal atrial fibrillation: Secondary | ICD-10-CM | POA: Diagnosis not present

## 2015-12-25 DIAGNOSIS — Z9012 Acquired absence of left breast and nipple: Secondary | ICD-10-CM | POA: Diagnosis not present

## 2015-12-25 DIAGNOSIS — I2510 Atherosclerotic heart disease of native coronary artery without angina pectoris: Secondary | ICD-10-CM | POA: Diagnosis not present

## 2015-12-25 DIAGNOSIS — I10 Essential (primary) hypertension: Secondary | ICD-10-CM | POA: Diagnosis not present

## 2015-12-25 DIAGNOSIS — Z8673 Personal history of transient ischemic attack (TIA), and cerebral infarction without residual deficits: Secondary | ICD-10-CM | POA: Diagnosis not present

## 2015-12-25 DIAGNOSIS — C541 Malignant neoplasm of endometrium: Secondary | ICD-10-CM | POA: Diagnosis not present

## 2015-12-29 DIAGNOSIS — I4891 Unspecified atrial fibrillation: Secondary | ICD-10-CM | POA: Diagnosis not present

## 2015-12-29 DIAGNOSIS — C541 Malignant neoplasm of endometrium: Secondary | ICD-10-CM | POA: Diagnosis not present

## 2015-12-29 DIAGNOSIS — I10 Essential (primary) hypertension: Secondary | ICD-10-CM | POA: Diagnosis not present

## 2015-12-30 DIAGNOSIS — I480 Paroxysmal atrial fibrillation: Secondary | ICD-10-CM | POA: Diagnosis not present

## 2015-12-30 DIAGNOSIS — M1711 Unilateral primary osteoarthritis, right knee: Secondary | ICD-10-CM | POA: Diagnosis not present

## 2015-12-30 DIAGNOSIS — I2510 Atherosclerotic heart disease of native coronary artery without angina pectoris: Secondary | ICD-10-CM | POA: Diagnosis not present

## 2015-12-30 DIAGNOSIS — I10 Essential (primary) hypertension: Secondary | ICD-10-CM | POA: Diagnosis not present

## 2015-12-30 DIAGNOSIS — Z9012 Acquired absence of left breast and nipple: Secondary | ICD-10-CM | POA: Diagnosis not present

## 2015-12-30 DIAGNOSIS — H3530 Unspecified macular degeneration: Secondary | ICD-10-CM | POA: Diagnosis not present

## 2015-12-30 DIAGNOSIS — C541 Malignant neoplasm of endometrium: Secondary | ICD-10-CM | POA: Diagnosis not present

## 2015-12-30 DIAGNOSIS — H409 Unspecified glaucoma: Secondary | ICD-10-CM | POA: Diagnosis not present

## 2015-12-30 DIAGNOSIS — Z8673 Personal history of transient ischemic attack (TIA), and cerebral infarction without residual deficits: Secondary | ICD-10-CM | POA: Diagnosis not present

## 2015-12-30 DIAGNOSIS — Z853 Personal history of malignant neoplasm of breast: Secondary | ICD-10-CM | POA: Diagnosis not present

## 2015-12-30 NOTE — Progress Notes (Signed)
Follow-up Note: Gyn-Onc  Consult was initially requested by Dr. Berenice Primas for the evaluation of Sheri Hensley 82 y.o. female  CC:  Chief Complaint  Patient presents with  . endometrial cancer    Assessment/Plan:  Sheri Hensley  is a 82 y.o.  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  She was recommended to stop plavix 5 days preop and continue baby aspirin through surgery. I discussed with the patient and her daughter that being on antiplatelet therapy perioperatively substantially increases her bleeding risk. I have concerns about such a short window on the plavis and would recommend a 10 day window preoperatively of no plavix, and continuation of baby ASA throughout. 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  However, given her cancer diagnosis, there is not a good option for avoiding risk.  We will contact Sheri Hensley later in the week when we have finalized her OR time. We will then inform her of the date to stop her Plavix.  We will not perform lymphadenectomy given her high risk for bleeding on anticoagulation therapy and advanced age.  HPI: Sheri Hensley is a 82 year old woman (G1P1) who is seen in consultation at the request of Dr Berenice Primas due to post-menopausal bleeding.  The patient has a history of postmenopausal bleeding since early September 2017. It was initially light spotting, then became slightly more persistent over time.  She was seen by Dr Berenice Primas on 10/20/15 where a TVUS was performed and demonstrated a uterus measuring 6.25x5.2x4.19cm but with a thickened endometrium of 2cm. The notes document that an office biopsy was not possible due to narrowed vagina, an Dr Berenice Primas did not think she would be able to accomplish a D&C in the  operating room.  The patient is relatively healthy for her advanced age. She has a history of an arrythmia for which she takes amiodarone. She also takes Plavix for a presumed TIA. She otherwise has a medical history significant for a prior appendectomy and 1 prior vaginal delivery.   Interval Hx:  On 11/20/15 she underwent D&C and pathology revealed grade 2 endomettrioid endometrial adenocarcinoma.  On the morning of her planned surgical date on 12/23/15 she was diagnosed with a TIA.  She has resolved all symptoms (right hand weakness). She was restarted on Plavix and baby ASA.   Current Meds:  Outpatient Encounter Prescriptions as of 12/31/2015  Medication Sig  . acetaminophen (TYLENOL) 500 MG tablet Take 500 mg by mouth every 6 (six) hours as needed for moderate pain or headache.  . ALPRAZolam (XANAX) 0.25 MG tablet Take 0.125-0.25 mg by mouth daily as needed for anxiety.   Marland Kitchen amiodarone (PACERONE) 200 MG tablet Take 100 mg by mouth every morning.   Marland Kitchen atorvastatin (LIPITOR) 10 MG tablet Take 10 mg by mouth at bedtime.  . betamethasone acetate-betamethasone sodium phosphate (CELESTONE) 6 (3-3) MG/ML injection Inject 12 mg into the articular space every 3 (three) months.  . bimatoprost (LUMIGAN) 0.01 % SOLN Place 1 drop into both eyes at bedtime.  . brimonidine (ALPHAGAN P) 0.1 % SOLN Place 1 drop into both eyes every 8 (eight) hours.   . clopidogrel (PLAVIX) 75 MG tablet Take 1 tablet (75 mg total) by mouth daily.  . cyanocobalamin (V-R VITAMIN B-12) 500 MCG tablet Take 500 mcg by  mouth daily.   Marland Kitchen lisinopril (PRINIVIL,ZESTRIL) 10 MG tablet Take 10 mg by mouth every morning.   . loratadine (CLARITIN) 10 MG tablet Take 10 mg by mouth daily as needed for allergies.  . Melatonin 10 MG CAPS Take 10 mg by mouth at bedtime.   . metoprolol tartrate (LOPRESSOR) 25 MG tablet Take 25 mg by mouth 2 (two) times daily.   . Multiple Vitamins-Minerals (CENTRUM VITAMINTS PO) Take 1 tablet by mouth every  morning.  . Multiple Vitamins-Minerals (ICAPS AREDS 2) CAPS Take 1 tablet by mouth daily.  Marland Kitchen omeprazole (PRILOSEC) 20 MG capsule Take 20 mg by mouth every morning.  . sertraline (ZOLOFT) 50 MG tablet Take 50 mg by mouth daily.   No facility-administered encounter medications on file as of 12/31/2015.     Allergy:  Allergies  Allergen Reactions  . Flagyl [Metronidazole] Rash    Social Hx:   Social History   Social History  . Marital status: Widowed    Spouse name: N/A  . Number of children: N/A  . Years of education: N/A   Occupational History  . Not on file.   Social History Main Topics  . Smoking status: Never Smoker  . Smokeless tobacco: Never Used  . Alcohol use Not on file     Comment: occasional  . Drug use: Unknown  . Sexual activity: Not on file   Other Topics Concern  . Not on file   Social History Narrative  . No narrative on file    Past Surgical Hx:  Past Surgical History:  Procedure Laterality Date  . APPENDECTOMY  1954  . CATARACT EXTRACTION W/ INTRAOCULAR LENS  IMPLANT, BILATERAL  1994  . colonscopy    . DILATION AND CURETTAGE OF UTERUS N/A 11/20/2015   Procedure: DILATATION AND CURETTAGE;  Surgeon: Everitt Amber, MD;  Location: [facility];  Service: Gynecology;  Laterality: N/A;  . DILATION AND CURETTAGE, DIAGNOSTIC / THERAPEUTIC N/A novmber 2017   per daughter with biopsy as stated  . KNEE ARTHROSCOPY Right 2002  . MASTECTOMY Left 1998   w/ lympn node dissection's    Past Medical Hx:  Past Medical History:  Diagnosis Date  . Cancer ([place]) 1998   left tamoxifem x 5 yrs  . Coronary artery disease    CARDIOLOGIST-  DR Geraldo Pitter ([facility] -[place])  . Endometrial cancer ([place])   . GERD (gastroesophageal reflux disease)   . Glaucoma, both eyes   . History of breast cancer    1998--  s/p  left mastectomy with sln dissection's  ,  no chemo or radiation-  no recurrence  . History of Clostridium difficile infection 5-6 yrs  ago  . History of TIAs    yrs ago ?   Marland Kitchen Hyperlipidemia   . Hypertension   . Macular degeneration   . Narrow complex tachycardia [facility])    cardiologist-  dr Salley Scarlet  . OA (osteoarthritis)    right knee  . PMB (postmenopausal bleeding)   . PONV (postoperative nausea and vomiting)    ponv after appendectomy 1954  . Thickened endometrium   . Wears glasses     Past Gynecological History:  G1 (SVD) No LMP recorded. Patient is postmenopausal.  Family Hx:  Family History  Problem Relation Age of Onset  . Hypertension Mother   . Hypertension Father     Review of Systems:  Constitutional  Feels well,    ENT Normal appearing ears and nares bilaterally Skin/Breast  No rash, sores,  jaundice, itching, dryness Cardiovascular  No chest pain, shortness of breath, or edema  Pulmonary  No cough or wheeze.  Gastro Intestinal  No nausea, vomitting, or diarrhoea. No bright red blood per rectum, no abdominal pain, change in bowel movement, or constipation.  Genito Urinary  No frequency, urgency, dysuria, + postmenopausal bleeding Musculo Skeletal  No myalgia, arthralgia, joint swelling or pain  Neurologic  No weakness, numbness, change in gait,  Psychology  No depression, anxiety, insomnia.   Vitals:  Blood pressure (!) 211/66, pulse (!) 56, temperature 98.7 F (37.1 C), temperature source Oral, resp. rate 16, height 5\' 4"  (1.626 m), weight 130 lb 11.2 oz (59.3 kg), SpO2 98 %.  Physical Exam: WD in NAD Neck  Supple NROM, without any enlargements.  Lymph Node Survey No cervical supraclavicular or inguinal adenopathy Cardiovascular  Pulse normal rate, regularity and rhythm. S1 and S2 normal.  Lungs  Clear to auscultation bilateraly, without wheezes/crackles/rhonchi. Good air movement.  Skin  No rash/lesions/breakdown  Psychiatry  Alert and oriented to person, place, and time  Abdomen  Normoactive bowel sounds, abdomen soft, non-tender and thin without evidence of hernia.   Back No CVA tenderness Genito Urinary  Vulva/vagina: Normal external female genitalia.  No lesions. No discharge or bleeding.  Bladder/urethra:  No lesions or masses, well supported bladder  Vagina: narrow but permits pederson speculum and a normal appearing cervix is seen clearly. The vaginal walls are smooth and atrophic and free of lesions.  Cervix: Normal appearing, no lesions. Stenotic - unsuccessful attempt at office biopsy  Uterus: Small, mobile, no parametrial involvement or nodularity.  Adnexa: no palpable masses. Rectal  deferred Extremities  No bilateral cyanosis, clubbing or edema.  20 minutes of face to face counseling was spent with the patient.  [name], MD  12/31/2015, 12:21 PM

## 2015-12-31 ENCOUNTER — Ambulatory Visit: Payer: Medicare HMO | Attending: Gynecologic Oncology | Admitting: Gynecologic Oncology

## 2015-12-31 ENCOUNTER — Encounter: Payer: Self-pay | Admitting: Gynecologic Oncology

## 2015-12-31 VITALS — BP 202/66 | HR 56 | Temp 98.7°F | Resp 16 | Ht 64.0 in | Wt 130.7 lb

## 2015-12-31 DIAGNOSIS — N950 Postmenopausal bleeding: Secondary | ICD-10-CM | POA: Diagnosis not present

## 2015-12-31 DIAGNOSIS — Z7902 Long term (current) use of antithrombotics/antiplatelets: Secondary | ICD-10-CM | POA: Diagnosis not present

## 2015-12-31 DIAGNOSIS — Z79899 Other long term (current) drug therapy: Secondary | ICD-10-CM | POA: Diagnosis not present

## 2015-12-31 DIAGNOSIS — Z8673 Personal history of transient ischemic attack (TIA), and cerebral infarction without residual deficits: Secondary | ICD-10-CM | POA: Diagnosis not present

## 2015-12-31 DIAGNOSIS — C541 Malignant neoplasm of endometrium: Secondary | ICD-10-CM | POA: Insufficient documentation

## 2015-12-31 DIAGNOSIS — G451 Carotid artery syndrome (hemispheric): Secondary | ICD-10-CM

## 2015-12-31 DIAGNOSIS — Z7901 Long term (current) use of anticoagulants: Secondary | ICD-10-CM

## 2015-12-31 NOTE — Patient Instructions (Signed)
We will be in touch with you once we finalize your surgery date. Please stop taking Plavix 10 days before your surgery. Start taking a baby Aspirin when you stop Plavix and continue taking it through surgery.

## 2016-01-01 DIAGNOSIS — M1711 Unilateral primary osteoarthritis, right knee: Secondary | ICD-10-CM | POA: Diagnosis not present

## 2016-01-01 DIAGNOSIS — Z9012 Acquired absence of left breast and nipple: Secondary | ICD-10-CM | POA: Diagnosis not present

## 2016-01-01 DIAGNOSIS — H409 Unspecified glaucoma: Secondary | ICD-10-CM | POA: Diagnosis not present

## 2016-01-01 DIAGNOSIS — Z8673 Personal history of transient ischemic attack (TIA), and cerebral infarction without residual deficits: Secondary | ICD-10-CM | POA: Diagnosis not present

## 2016-01-01 DIAGNOSIS — I10 Essential (primary) hypertension: Secondary | ICD-10-CM | POA: Diagnosis not present

## 2016-01-01 DIAGNOSIS — C541 Malignant neoplasm of endometrium: Secondary | ICD-10-CM | POA: Diagnosis not present

## 2016-01-01 DIAGNOSIS — I480 Paroxysmal atrial fibrillation: Secondary | ICD-10-CM | POA: Diagnosis not present

## 2016-01-01 DIAGNOSIS — Z853 Personal history of malignant neoplasm of breast: Secondary | ICD-10-CM | POA: Diagnosis not present

## 2016-01-01 DIAGNOSIS — I2510 Atherosclerotic heart disease of native coronary artery without angina pectoris: Secondary | ICD-10-CM | POA: Diagnosis not present

## 2016-01-01 DIAGNOSIS — H3530 Unspecified macular degeneration: Secondary | ICD-10-CM | POA: Diagnosis not present

## 2016-01-06 DIAGNOSIS — I10 Essential (primary) hypertension: Secondary | ICD-10-CM | POA: Diagnosis not present

## 2016-01-06 DIAGNOSIS — I480 Paroxysmal atrial fibrillation: Secondary | ICD-10-CM | POA: Diagnosis not present

## 2016-01-06 DIAGNOSIS — I2510 Atherosclerotic heart disease of native coronary artery without angina pectoris: Secondary | ICD-10-CM | POA: Diagnosis not present

## 2016-01-06 DIAGNOSIS — Z853 Personal history of malignant neoplasm of breast: Secondary | ICD-10-CM | POA: Diagnosis not present

## 2016-01-06 DIAGNOSIS — Z9012 Acquired absence of left breast and nipple: Secondary | ICD-10-CM | POA: Diagnosis not present

## 2016-01-06 DIAGNOSIS — Z8673 Personal history of transient ischemic attack (TIA), and cerebral infarction without residual deficits: Secondary | ICD-10-CM | POA: Diagnosis not present

## 2016-01-06 DIAGNOSIS — C541 Malignant neoplasm of endometrium: Secondary | ICD-10-CM | POA: Diagnosis not present

## 2016-01-06 DIAGNOSIS — H3530 Unspecified macular degeneration: Secondary | ICD-10-CM | POA: Diagnosis not present

## 2016-01-06 DIAGNOSIS — M1711 Unilateral primary osteoarthritis, right knee: Secondary | ICD-10-CM | POA: Diagnosis not present

## 2016-01-06 DIAGNOSIS — H409 Unspecified glaucoma: Secondary | ICD-10-CM | POA: Diagnosis not present

## 2016-01-09 DIAGNOSIS — H3530 Unspecified macular degeneration: Secondary | ICD-10-CM | POA: Diagnosis not present

## 2016-01-09 DIAGNOSIS — M1711 Unilateral primary osteoarthritis, right knee: Secondary | ICD-10-CM | POA: Diagnosis not present

## 2016-01-09 DIAGNOSIS — I2510 Atherosclerotic heart disease of native coronary artery without angina pectoris: Secondary | ICD-10-CM | POA: Diagnosis not present

## 2016-01-09 DIAGNOSIS — I480 Paroxysmal atrial fibrillation: Secondary | ICD-10-CM | POA: Diagnosis not present

## 2016-01-09 DIAGNOSIS — Z9012 Acquired absence of left breast and nipple: Secondary | ICD-10-CM | POA: Diagnosis not present

## 2016-01-09 DIAGNOSIS — C541 Malignant neoplasm of endometrium: Secondary | ICD-10-CM | POA: Diagnosis not present

## 2016-01-09 DIAGNOSIS — H409 Unspecified glaucoma: Secondary | ICD-10-CM | POA: Diagnosis not present

## 2016-01-09 DIAGNOSIS — Z853 Personal history of malignant neoplasm of breast: Secondary | ICD-10-CM | POA: Diagnosis not present

## 2016-01-09 DIAGNOSIS — I10 Essential (primary) hypertension: Secondary | ICD-10-CM | POA: Diagnosis not present

## 2016-01-09 DIAGNOSIS — Z8673 Personal history of transient ischemic attack (TIA), and cerebral infarction without residual deficits: Secondary | ICD-10-CM | POA: Diagnosis not present

## 2016-01-20 DIAGNOSIS — I480 Paroxysmal atrial fibrillation: Secondary | ICD-10-CM | POA: Diagnosis not present

## 2016-01-20 DIAGNOSIS — I10 Essential (primary) hypertension: Secondary | ICD-10-CM | POA: Diagnosis not present

## 2016-01-20 DIAGNOSIS — M1711 Unilateral primary osteoarthritis, right knee: Secondary | ICD-10-CM | POA: Diagnosis not present

## 2016-01-20 DIAGNOSIS — Z9012 Acquired absence of left breast and nipple: Secondary | ICD-10-CM | POA: Diagnosis not present

## 2016-01-20 DIAGNOSIS — Z853 Personal history of malignant neoplasm of breast: Secondary | ICD-10-CM | POA: Diagnosis not present

## 2016-01-20 DIAGNOSIS — Z8673 Personal history of transient ischemic attack (TIA), and cerebral infarction without residual deficits: Secondary | ICD-10-CM | POA: Diagnosis not present

## 2016-01-20 DIAGNOSIS — I2510 Atherosclerotic heart disease of native coronary artery without angina pectoris: Secondary | ICD-10-CM | POA: Diagnosis not present

## 2016-01-20 DIAGNOSIS — H3530 Unspecified macular degeneration: Secondary | ICD-10-CM | POA: Diagnosis not present

## 2016-01-20 DIAGNOSIS — H409 Unspecified glaucoma: Secondary | ICD-10-CM | POA: Diagnosis not present

## 2016-01-20 DIAGNOSIS — C541 Malignant neoplasm of endometrium: Secondary | ICD-10-CM | POA: Diagnosis not present

## 2016-01-20 NOTE — Progress Notes (Signed)
Stress test cardio [facility] 12/02/2015 EKG in chart 12/03/2015 EKG EPIC 12/22/15 ECHO 12/23/15 LOV 10/10 Cardio Dr Lovette Cliche in chart CXR EPIC 12/15/2015

## 2016-01-20 NOTE — Patient Instructions (Addendum)
Sheri Hensley  01/20/2016   Your procedure is scheduled on: 01/27/2016  Report to [facility] Main  Entrance take [place]  elevators to 3rd floor to  [place] at [phone].  Call this number if you have problems the morning of surgery [phone]   Remember: ONLY 1 PERSON MAY GO WITH YOU TO SHORT STAY TO GET  READY MORNING OF [place].  Do not eat food or drink liquids :After Midnight. CLEAR LIQUID  DIET DAY BEFORE ON 01/26/2016. SEE INSTRUCTIONS BELOW.     Take these medicines the morning of surgery with A SIP OF WATER: METOPROLOL TARTRATE (LOPRESSOR), Tylenol, Xanax if needed, Amiodarone(Pacerone), Atorvastatin (Lipitor), Omeprazole(Prilosec), Sertraline(Zoloft), Brimonidine(Alphagan) eye drops, Claritin                                You may not have any metal on your body including hair pins and              piercings  Do not wear jewelry, make-up, lotions, powders or perfumes, deodorant             Do not wear nail polish.  Do not shave  48 hours prior to surgery.              Men may shave face and neck.   Do not bring valuables to the hospital. [place].  Contacts, dentures or bridgework may not be worn into surgery.  Leave suitcase in the car. After surgery it may be brought to your room.                  Please read over the following fact sheets you were given: _____________________________________________________________________    CLEAR LIQUID DIET   Foods Allowed                                                                     Foods Excluded  Coffee and tea, regular and decaf                             liquids that you cannot  Plain Jell-O in any flavor                                             see through such as: Fruit ices (not with fruit pulp)                                     milk, soups, orange juice  Iced Popsicles                                    All solid  food Carbonated beverages, regular  and diet                                    Cranberry, grape and apple juices Sports drinks like Gatorade Lightly seasoned clear broth or consume(fat free) Sugar, honey syrup  Sample Menu Breakfast                                Lunch                                     Supper Cranberry juice                    Beef broth                            Chicken broth Jell-O                                     Grape juice                           Apple juice Coffee or tea                        Jell-O                                      Popsicle                                                Coffee or tea                        Coffee or tea  _____________________________________________________________________     CLEAR LIQUID DIET   Foods Allowed                                                                     Foods Excluded  Coffee and tea, regular and decaf                             liquids that you cannot  Plain Jell-O in any flavor                                             see through such as: Fruit ices (not with fruit pulp)  milk, soups, orange juice  Iced Popsicles                                    All solid food                                  Cranberry, grape and apple juices Sports drinks like Gatorade Lightly seasoned clear broth or consume(fat free) Sugar, honey syrup  Sample Menu Breakfast                                Lunch                                     Supper Cranberry juice                    Beef broth                            Chicken broth Jell-O                                     Grape juice                           Apple juice Coffee or tea                        Jell-O                                      Popsicle                                                Coffee or tea                        Coffee or  tea  _____________________________________________________________________     [facility] - Preparing for Surgery Before surgery, you can play an important role.  Because skin is not sterile, your skin needs to be as free of germs as possible.  You can reduce the number of germs on your skin by washing with CHG (chlorahexidine gluconate) soap before surgery.  CHG is an antiseptic cleaner which kills germs and bonds with the skin to continue killing germs even after washing. Please DO NOT use if you have an allergy to CHG or antibacterial soaps.  If your skin becomes reddened/irritated stop using the CHG and inform your nurse when you arrive at Short Stay. Do not shave (including legs and underarms) for at least 48 hours prior to the first CHG shower.  You may shave your face/neck. Please follow these instructions carefully:  1.  Shower with CHG Soap the night before surgery and the  morning of Surgery.  2.  If you choose to wash your hair, wash  your hair first as usual with your  normal  shampoo.  3.  After you shampoo, rinse your hair and body thoroughly to remove the  shampoo.                           4.  Use CHG as you would any other liquid soap.  You can apply chg directly  to the skin and wash                       Gently with a scrungie or clean washcloth.  5.  Apply the CHG Soap to your body ONLY FROM THE NECK DOWN.   Do not use on face/ open                           Wound or open sores. Avoid contact with eyes, ears mouth and genitals (private parts).                       Wash face,  Genitals (private parts) with your normal soap.             6.  Wash thoroughly, paying special attention to the area where your surgery  will be performed.  7.  Thoroughly rinse your body with warm water from the neck down.  8.  DO NOT shower/wash with your normal soap after using and rinsing off  the CHG Soap.                9.  Pat yourself dry with a clean towel.            10.  Wear clean  pajamas.            11.  Place clean sheets on your bed the night of your first shower and do not  sleep with pets. Day of Surgery : Do not apply any lotions/deodorants the morning of surgery.  Please wear clean clothes to the hospital/surgery center.  FAILURE TO FOLLOW THESE INSTRUCTIONS MAY RESULT IN THE CANCELLATION OF YOUR SURGERY PATIENT SIGNATURE_________________________________  NURSE SIGNATURE__________________________________  ________________________________________________________________________   Adam Phenix  An incentive spirometer is a tool that can help keep your lungs clear and active. This tool measures how well you are filling your lungs with each breath. Taking long deep breaths may help reverse or decrease the chance of developing breathing (pulmonary) problems (especially infection) following:  A long period of time when you are unable to move or be active. BEFORE THE PROCEDURE   If the spirometer includes an indicator to show your best effort, your nurse or respiratory therapist will set it to a desired goal.  If possible, sit up straight or lean slightly forward. Try not to slouch.  Hold the incentive spirometer in an upright position. INSTRUCTIONS FOR USE  1. Sit on the edge of your bed if possible, or sit up as far as you can in bed or on a chair. 2. Hold the incentive spirometer in an upright position. 3. Breathe out normally. 4. Place the mouthpiece in your mouth and seal your lips tightly around it. 5. Breathe in slowly and as deeply as possible, raising the piston or the ball toward the top of the column. 6. Hold your breath for 3-5 seconds or for as long as possible. Allow the piston or ball to fall to the bottom of the column. 7.  Remove the mouthpiece from your mouth and breathe out normally. 8. Rest for a few seconds and repeat Steps 1 through 7 at least 10 times every 1-2 hours when you are awake. Take your time and take a few normal breaths  between deep breaths. 9. The spirometer may include an indicator to show your best effort. Use the indicator as a goal to work toward during each repetition. 10. After each set of 10 deep breaths, practice coughing to be sure your lungs are clear. If you have an incision (the cut made at the time of surgery), support your incision when coughing by placing a pillow or rolled up towels firmly against it. Once you are able to get out of bed, walk around indoors and cough well. You may stop using the incentive spirometer when instructed by your caregiver.  RISKS AND COMPLICATIONS  Take your time so you do not get dizzy or light-headed.  If you are in pain, you may need to take or ask for pain medication before doing incentive spirometry. It is harder to take a deep breath if you are having pain. AFTER USE  Rest and breathe slowly and easily.  It can be helpful to keep track of a log of your progress. Your caregiver can provide you with a simple table to help with this. If you are using the spirometer at home, follow these instructions: [place] IF:   You are having difficultly using the spirometer.  You have trouble using the spirometer as often as instructed.  Your pain medication is not giving enough relief while using the spirometer.  You develop fever of 100.5 F (38.1 C) or higher. SEEK IMMEDIATE MEDICAL CARE IF:   You cough up bloody sputum that had not been present before.  You develop fever of 102 F (38.9 C) or greater.  You develop worsening pain at or near the incision site. MAKE SURE YOU:   Understand these instructions.  Will watch your condition.  Will get help right away if you are not doing well or get worse. Document Released: 05/17/2006 Document Revised: 03/29/2011 Document Reviewed: 07/18/2006 ExitCare Patient Information 2014 ExitCare, [place].   ________________________________________________________________________  WHAT IS A BLOOD TRANSFUSION?  Blood Transfusion Information  A transfusion is the replacement of blood or some of its parts. Blood is made up of multiple cells which provide different functions.  Red blood cells carry oxygen and are used for blood loss replacement.  White blood cells fight against infection.  Platelets control bleeding.  Plasma helps clot blood.  Other blood products are available for specialized needs, such as hemophilia or other clotting disorders. BEFORE THE TRANSFUSION  Who gives blood for transfusions?   Healthy volunteers who are fully evaluated to make sure their blood is safe. This is blood bank blood. Transfusion therapy is the safest it has ever been in the practice of medicine. Before blood is taken from a donor, a complete history is taken to make sure that person has no history of diseases nor engages in risky social behavior (examples are intravenous drug use or sexual activity with multiple partners). The donor's travel history is screened to minimize risk of transmitting infections, such as malaria. The donated blood is tested for signs of infectious diseases, such as HIV and hepatitis. The blood is then tested to be sure it is compatible with you in order to minimize the chance of a transfusion reaction. If you or a relative donates blood, this is often done in anticipation  of surgery and is not appropriate for emergency situations. It takes many days to process the donated blood. RISKS AND COMPLICATIONS Although transfusion therapy is very safe and saves many lives, the main dangers of transfusion include:   Getting an infectious disease.  Developing a transfusion reaction. This is an allergic reaction to something in the blood you were given. Every precaution is taken to prevent this. The decision to have a blood transfusion has been considered carefully by your caregiver before blood is given. Blood is not given unless the benefits outweigh the risks. AFTER THE TRANSFUSION  Right  after receiving a blood transfusion, you will usually feel much better and more energetic. This is especially true if your red blood cells have gotten low (anemic). The transfusion raises the level of the red blood cells which carry oxygen, and this usually causes an energy increase.  The nurse administering the transfusion will monitor you carefully for complications. HOME CARE INSTRUCTIONS  No special instructions are needed after a transfusion. You may find your energy is better. Speak with your caregiver about any limitations on activity for underlying diseases you may have. SEEK MEDICAL CARE IF:   Your condition is not improving after your transfusion.  You develop redness or irritation at the intravenous (IV) site. SEEK IMMEDIATE MEDICAL CARE IF:  Any of the following symptoms occur over the next 12 hours:  Shaking chills.  You have a temperature by mouth above 102 F (38.9 C), not controlled by medicine.  Chest, back, or muscle pain.  People around you feel you are not acting correctly or are confused.  Shortness of breath or difficulty breathing.  Dizziness and fainting.  You get a rash or develop hives.  You have a decrease in urine output.  Your urine turns a dark color or changes to pink, red, or brown. Any of the following symptoms occur over the next 10 days:  You have a temperature by mouth above 102 F (38.9 C), not controlled by medicine.  Shortness of breath.  Weakness after normal activity.  The white part of the eye turns yellow (jaundice).  You have a decrease in the amount of urine or are urinating less often.  Your urine turns a dark color or changes to pink, red, or brown. Document Released: 01/02/2000 Document Revised: 03/29/2011 Document Reviewed: 08/21/2007 [facility] Patient Information 2014 [place], [place].  _______________________________________________________________________

## 2016-01-21 ENCOUNTER — Telehealth: Payer: Self-pay | Admitting: Neurology

## 2016-01-21 ENCOUNTER — Encounter (INDEPENDENT_AMBULATORY_CARE_PROVIDER_SITE_OTHER): Payer: Self-pay

## 2016-01-21 ENCOUNTER — Encounter (HOSPITAL_COMMUNITY): Payer: Self-pay

## 2016-01-21 ENCOUNTER — Telehealth: Payer: Self-pay | Admitting: Nurse Practitioner

## 2016-01-21 ENCOUNTER — Encounter (HOSPITAL_COMMUNITY)
Admission: RE | Admit: 2016-01-21 | Discharge: 2016-01-21 | Disposition: A | Discharge status: Home / Self Care | Payer: Medicare HMO | Source: Ambulatory Visit | Attending: Gynecologic Oncology | Admitting: Gynecologic Oncology

## 2016-01-21 DIAGNOSIS — C541 Malignant neoplasm of endometrium: Secondary | ICD-10-CM | POA: Diagnosis not present

## 2016-01-21 DIAGNOSIS — Z01818 Encounter for other preprocedural examination: Secondary | ICD-10-CM | POA: Diagnosis present

## 2016-01-21 HISTORY — DX: Other skin changes: R23.8

## 2016-01-21 HISTORY — DX: Spontaneous ecchymoses: R23.3

## 2016-01-21 LAB — URINALYSIS, ROUTINE W REFLEX MICROSCOPIC
BILIRUBIN URINE: NEGATIVE
GLUCOSE, UA: NEGATIVE mg/dL
HGB URINE DIPSTICK: NEGATIVE
KETONES UR: NEGATIVE mg/dL
NITRITE: NEGATIVE
PH: 5 (ref 5.0–8.0)
Protein, ur: NEGATIVE mg/dL
SPECIFIC GRAVITY, URINE: 1.019 (ref 1.005–1.030)
Squamous Epithelial / LPF: NONE SEEN

## 2016-01-21 LAB — COMPREHENSIVE METABOLIC PANEL
ALBUMIN: 4.4 g/dL (ref 3.5–5.0)
ALT: 25 U/L (ref 14–54)
AST: 30 U/L (ref 15–41)
Alkaline Phosphatase: 77 U/L (ref 38–126)
Anion gap: 9 (ref 5–15)
BILIRUBIN TOTAL: 1 mg/dL (ref 0.3–1.2)
BUN: 21 mg/dL — AB (ref 6–20)
CHLORIDE: 101 mmol/L (ref 101–111)
CO2: 28 mmol/L (ref 22–32)
Calcium: 9.2 mg/dL (ref 8.9–10.3)
Creatinine, Ser: 0.95 mg/dL (ref 0.44–1.00)
GFR calc Af Amer: 58 mL/min — ABNORMAL LOW (ref >60)
GFR calc non Af Amer: 50 mL/min — ABNORMAL LOW (ref >60)
GLUCOSE: 87 mg/dL (ref 65–99)
Potassium: 4.6 mmol/L (ref 3.5–5.1)
SODIUM: 138 mmol/L (ref 135–145)
TOTAL PROTEIN: 6.8 g/dL (ref 6.5–8.1)

## 2016-01-21 LAB — CBC WITH DIFFERENTIAL/PLATELET
Basophils Absolute: 0 K/uL (ref 0.0–0.1)
Basophils Relative: 0 %
Eosinophils Absolute: 0.1 K/uL (ref 0.0–0.7)
Eosinophils Relative: 1 %
HCT: 36.4 % (ref 36.0–46.0)
Hemoglobin: 11.9 g/dL — ABNORMAL LOW (ref 12.0–15.0)
Lymphocytes Relative: 15 %
Lymphs Abs: 1.1 K/uL (ref 0.7–4.0)
MCH: 30.2 pg (ref 26.0–34.0)
MCHC: 32.7 g/dL (ref 30.0–36.0)
MCV: 92.4 fL (ref 78.0–100.0)
Monocytes Absolute: 0.6 K/uL (ref 0.1–1.0)
Monocytes Relative: 8 %
Neutro Abs: 5.3 K/uL (ref 1.7–7.7)
Neutrophils Relative %: 76 %
Platelets: 148 K/uL — ABNORMAL LOW (ref 150–400)
RBC: 3.94 MIL/uL (ref 3.87–5.11)
RDW: 14.7 % (ref 11.5–15.5)
WBC: 7 K/uL (ref 4.0–10.5)

## 2016-01-21 NOTE — Telephone Encounter (Signed)
Natale Milch RN/[facility] GYN oncology [phone] called said pt is is having hysterectomy on 01/27/16 and anestheologist is wanting neurological clearance that the pt can undergo anethestheia for robotic hysterectomy. She is requesting a letter asap as surgery is 01/27/16  (f) [phone].

## 2016-01-21 NOTE — Telephone Encounter (Signed)
Per anesthesia, requesting neuro clearance for gyn surgery on 01/27/16 with Dr. Denman George. Rn called Dr. Phoebe Sharps office to request written clearance to be faxed or to set up f/u apt in their office. Receptionist to get message to MD. I conveyed urgency as surgery is scheduled on 1/9. Note also sent directly to Dr. Erlinda Hong by Lenna Sciara, NP.

## 2016-01-21 NOTE — Progress Notes (Signed)
PATIENT'S DAUGHTER STATED SHE WAS TOLD BY DR Denman George CLEAR LIQUIDS DAY BEFORE SURGERY ON 01-26-16, CALLED MELISSA CROSS NP TO VERIFY, WAS TOLD BY MELISSA CROSS NO LIGHT DIET IS FINE, BUT PATIENT'S DAUGHTER PREFERS CLEAR LIQUIDS DAY BEFORE SURGERY SO PATIENT INSTRUCTED CLEAR LIQUIDS DAY BEFORE SURGERY. SPOKE WITH DR Gifford Shave ANESTHESIA AND MADE AWARE PATIENT HAD TIA Dec 22, 2015 AND DOES NOT HAVE NEURO CLEARANCE, PATIENTS AND DAUGHTER AWARE PATIENT NEEDS NEURO CLEARANCE DUE TO RECENT TIA. ACLLED MELISSA CROOS NP AND MADE AWARE PATINET NEEDS NEURO CLEARANCE FOR 01-27-16 SURGERY PER DR Gifford Shave ANESTHESIA.

## 2016-01-21 NOTE — Telephone Encounter (Signed)
Rn spoke with Melissa at [facility] office. She spoke with Dr.Xu and saw the recommendations in the computer. They have epic system. Melissa verbalized understanding of the clearance from Dr.Xu.

## 2016-01-21 NOTE — Telephone Encounter (Signed)
Pt had DUB and diagnosed with endometrial cancer. Therefore, hysterectomy is considered as a urgent procedure for which she can be cleared from neurology standpoint although she just had TIA one month ago. For the surgery, we recommend that pt my stop plavix 5 days prior to the scheduled procedure and resume it after the procedure when safe. However, pt has to be aware that it carries a small but acceptable peri-operative risk for TIA/stroke while off plavix. If it is appropriate, we recommend ASA 81mg  daily for the days pt is off plavix. plavix can be resumed post-procedure if hemodynamically stable. We also recommend to avoid hypotension perioperatively due to recent TIA. After procedure, pt should make appointment with her cardiology ASAP to consider further anticoagulation for her stroke prevention. Thanks.  Rosalin Hawking, MD PhD Stroke Neurology 01/21/2016 3:25 PM

## 2016-01-21 NOTE — Telephone Encounter (Signed)
The GYN office would like a call at [phone]. Please ask for Natale Milch the RN or Melissa(NP).

## 2016-01-21 NOTE — Telephone Encounter (Signed)
Rn call [phone] and spoke with the Joylene John NP about pt needing neuro clearance for total abdominal hysterectomy schedule 01/27/2015. Melissa stated per Dr. Erlinda Hong note pt does not need a follow up at [facility]. Melissa(NP) stated anesthesia wants clearance for the pt because she is on plavix. Rn stated Dr. Erlinda Hong is working in the hospital this week. Message will be sent to Dr. Erlinda Hong. Lenna Sciara verbalized understanding.

## 2016-01-22 LAB — URINE CULTURE

## 2016-01-22 NOTE — Progress Notes (Signed)
Neuro Clearance Dr Erlinda Hong in chart 01/21/2016

## 2016-01-26 ENCOUNTER — Telehealth: Payer: Self-pay

## 2016-01-26 MED ORDER — ENOXAPARIN SODIUM 40 MG/0.4ML ~~LOC~~ SOLN
40.0000 mg | SUBCUTANEOUS | Status: AC
  Administered 2016-01-27: 40 mg via SUBCUTANEOUS
  Filled 2016-01-26 (×2): qty 0.4

## 2016-01-26 NOTE — Anesthesia Preprocedure Evaluation (Addendum)
Anesthesia Evaluation  Patient identified by MRN, date of birth, ID band Patient awake    Reviewed: Allergy & Precautions, NPO status , Patient's Chart, lab work & pertinent test results  Airway Mallampati: II  TM Distance: >3 FB Neck ROM: Full    Dental  (+) Dental Advisory Given   Pulmonary neg pulmonary ROS,    breath sounds clear to auscultation       Cardiovascular hypertension, Pt. on medications and Pt. on home beta blockers + CAD   Rhythm:Regular Rate:Normal     Neuro/Psych TIA   GI/Hepatic Neg liver ROS, GERD  ,  Endo/Other  negative endocrine ROS  Renal/GU negative Renal ROS     Musculoskeletal  (+) Arthritis ,   Abdominal   Peds  Hematology negative hematology ROS (+)   Anesthesia Other Findings   Reproductive/Obstetrics                            Lab Results  Component Value Date   WBC 7.0 01/21/2016   HGB 11.9 (L) 01/21/2016   HCT 36.4 01/21/2016   MCV 92.4 01/21/2016   PLT 148 (L) 01/21/2016   Lab Results  Component Value Date   CREATININE 0.95 01/21/2016   BUN 21 (H) 01/21/2016   NA 138 01/21/2016   K 4.6 01/21/2016   CL 101 01/21/2016   CO2 28 01/21/2016    Anesthesia Physical Anesthesia Plan  ASA: III  Anesthesia Plan: General   Post-op Pain Management:    Induction: Intravenous  Airway Management Planned: Oral ETT  Additional Equipment:   Intra-op Plan:   Post-operative Plan: Extubation in OR  Informed Consent: I have reviewed the patients History and Physical, chart, labs and discussed the procedure including the risks, benefits and alternatives for the proposed anesthesia with the patient or authorized representative who has indicated his/her understanding and acceptance.     Plan Discussed with:   Anesthesia Plan Comments:        Anesthesia Quick Evaluation

## 2016-01-26 NOTE — Telephone Encounter (Signed)
Writer spoke with pt's daughter and confirmed that Sheri Hensley had stopped the Plavix 10 days ago an is just taking 81mg  asa per MD

## 2016-01-27 ENCOUNTER — Ambulatory Visit (HOSPITAL_COMMUNITY): Payer: Medicare HMO | Admitting: Anesthesiology

## 2016-01-27 ENCOUNTER — Ambulatory Visit (HOSPITAL_COMMUNITY)
Admission: RE | Admit: 2016-01-27 | Discharge: 2016-01-28 | Disposition: A | Discharge status: Home / Self Care | Payer: Medicare HMO | Source: Ambulatory Visit | Attending: Gynecologic Oncology | Admitting: Gynecologic Oncology

## 2016-01-27 ENCOUNTER — Encounter (HOSPITAL_COMMUNITY): Payer: Self-pay | Admitting: *Deleted

## 2016-01-27 ENCOUNTER — Encounter (HOSPITAL_COMMUNITY)
Admission: RE | Disposition: A | Discharge status: Home / Self Care | Payer: Self-pay | Source: Ambulatory Visit | Attending: Gynecologic Oncology

## 2016-01-27 DIAGNOSIS — H409 Unspecified glaucoma: Secondary | ICD-10-CM | POA: Diagnosis not present

## 2016-01-27 DIAGNOSIS — N800 Endometriosis of uterus: Secondary | ICD-10-CM | POA: Diagnosis not present

## 2016-01-27 DIAGNOSIS — Z7902 Long term (current) use of antithrombotics/antiplatelets: Secondary | ICD-10-CM | POA: Diagnosis not present

## 2016-01-27 DIAGNOSIS — I2510 Atherosclerotic heart disease of native coronary artery without angina pectoris: Secondary | ICD-10-CM | POA: Diagnosis not present

## 2016-01-27 DIAGNOSIS — Z8673 Personal history of transient ischemic attack (TIA), and cerebral infarction without residual deficits: Secondary | ICD-10-CM | POA: Diagnosis not present

## 2016-01-27 DIAGNOSIS — E785 Hyperlipidemia, unspecified: Secondary | ICD-10-CM | POA: Insufficient documentation

## 2016-01-27 DIAGNOSIS — Y836 Removal of other organ (partial) (total) as the cause of abnormal reaction of the patient, or of later complication, without mention of misadventure at the time of the procedure: Secondary | ICD-10-CM | POA: Insufficient documentation

## 2016-01-27 DIAGNOSIS — I10 Essential (primary) hypertension: Secondary | ICD-10-CM | POA: Insufficient documentation

## 2016-01-27 DIAGNOSIS — Z9012 Acquired absence of left breast and nipple: Secondary | ICD-10-CM | POA: Insufficient documentation

## 2016-01-27 DIAGNOSIS — I480 Paroxysmal atrial fibrillation: Secondary | ICD-10-CM | POA: Diagnosis not present

## 2016-01-27 DIAGNOSIS — D259 Leiomyoma of uterus, unspecified: Secondary | ICD-10-CM | POA: Diagnosis not present

## 2016-01-27 DIAGNOSIS — Z79899 Other long term (current) drug therapy: Secondary | ICD-10-CM | POA: Insufficient documentation

## 2016-01-27 DIAGNOSIS — N950 Postmenopausal bleeding: Secondary | ICD-10-CM | POA: Insufficient documentation

## 2016-01-27 DIAGNOSIS — Z853 Personal history of malignant neoplasm of breast: Secondary | ICD-10-CM | POA: Diagnosis not present

## 2016-01-27 DIAGNOSIS — K219 Gastro-esophageal reflux disease without esophagitis: Secondary | ICD-10-CM | POA: Diagnosis not present

## 2016-01-27 DIAGNOSIS — M25511 Pain in right shoulder: Secondary | ICD-10-CM | POA: Insufficient documentation

## 2016-01-27 DIAGNOSIS — C541 Malignant neoplasm of endometrium: Secondary | ICD-10-CM | POA: Diagnosis not present

## 2016-01-27 HISTORY — PX: ROBOTIC ASSISTED TOTAL HYSTERECTOMY WITH BILATERAL SALPINGO OOPHERECTOMY: SHX6086

## 2016-01-27 LAB — TYPE AND SCREEN
ABO/RH(D): O NEG
Antibody Screen: NEGATIVE

## 2016-01-27 SURGERY — HYSTERECTOMY, TOTAL, ROBOT-ASSISTED, LAPAROSCOPIC, WITH BILATERAL SALPINGO-OOPHORECTOMY
Anesthesia: General | Laterality: Bilateral

## 2016-01-27 MED ORDER — FENTANYL CITRATE (PF) 250 MCG/5ML IJ SOLN
INTRAMUSCULAR | Status: DC | PRN
  Administered 2016-01-27 (×5): 50 ug via INTRAVENOUS

## 2016-01-27 MED ORDER — POLYETHYLENE GLYCOL 3350 17 G PO PACK: 17.0000 g | PACK | Freq: Every day | ORAL | Status: DC | PRN

## 2016-01-27 MED ORDER — SUGAMMADEX SODIUM 200 MG/2ML IV SOLN
INTRAVENOUS | Status: AC
  Filled 2016-01-27: qty 2

## 2016-01-27 MED ORDER — HYDRALAZINE HCL 20 MG/ML IJ SOLN
INTRAMUSCULAR | Status: AC
  Filled 2016-01-27: qty 1

## 2016-01-27 MED ORDER — FENTANYL CITRATE (PF) 100 MCG/2ML IJ SOLN
INTRAMUSCULAR | Status: DC | PRN
  Administered 2016-01-27 (×2): 50 ug via INTRAVENOUS

## 2016-01-27 MED ORDER — LIDOCAINE 2% (20 MG/ML) 5 ML SYRINGE
INTRAMUSCULAR | Status: DC | PRN
  Administered 2016-01-27: 40 mg via INTRAVENOUS

## 2016-01-27 MED ORDER — ROCURONIUM BROMIDE 50 MG/5ML IV SOSY
PREFILLED_SYRINGE | INTRAVENOUS | Status: AC
  Filled 2016-01-27: qty 5

## 2016-01-27 MED ORDER — ENOXAPARIN SODIUM 40 MG/0.4ML ~~LOC~~ SOLN: 40.0000 mg | SUBCUTANEOUS | Status: DC

## 2016-01-27 MED ORDER — SUCCINYLCHOLINE CHLORIDE 200 MG/10ML IV SOSY
PREFILLED_SYRINGE | INTRAVENOUS | Status: DC | PRN
  Administered 2016-01-27: 100 mg via INTRAVENOUS

## 2016-01-27 MED ORDER — ONDANSETRON HCL 4 MG/2ML IJ SOLN
INTRAMUSCULAR | Status: DC | PRN
  Administered 2016-01-27: 4 mg via INTRAVENOUS

## 2016-01-27 MED ORDER — FENTANYL CITRATE (PF) 250 MCG/5ML IJ SOLN
INTRAMUSCULAR | Status: AC
  Filled 2016-01-27: qty 5

## 2016-01-27 MED ORDER — KCL IN DEXTROSE-NACL 20-5-0.45 MEQ/L-%-% IV SOLN
INTRAVENOUS | Status: DC
  Administered 2016-01-27 – 2016-01-28 (×2): via INTRAVENOUS
  Filled 2016-01-27 (×2): qty 1000

## 2016-01-27 MED ORDER — STERILE WATER FOR IRRIGATION IR SOLN
Status: DC | PRN
  Administered 2016-01-27: 1000 mL

## 2016-01-27 MED ORDER — ALPRAZOLAM 0.25 MG PO TABS
0.1250 mg | ORAL_TABLET | Freq: Every day | ORAL | Status: DC | PRN
  Administered 2016-01-27: 0.25 mg via ORAL
  Filled 2016-01-27: qty 1

## 2016-01-27 MED ORDER — MAGNESIUM CITRATE PO SOLN: 1.0000 | Freq: Once | ORAL | Status: DC | PRN

## 2016-01-27 MED ORDER — SUCCINYLCHOLINE CHLORIDE 200 MG/10ML IV SOSY
PREFILLED_SYRINGE | INTRAVENOUS | Status: AC
  Filled 2016-01-27: qty 10

## 2016-01-27 MED ORDER — PHENYLEPHRINE HCL 10 MG/ML IJ SOLN
INTRAVENOUS | Status: DC | PRN
  Administered 2016-01-27: 5 ug/min via INTRAVENOUS

## 2016-01-27 MED ORDER — ROCURONIUM BROMIDE 10 MG/ML (PF) SYRINGE
PREFILLED_SYRINGE | INTRAVENOUS | Status: DC | PRN
  Administered 2016-01-27: 35 mg via INTRAVENOUS

## 2016-01-27 MED ORDER — LATANOPROST 0.005 % OP SOLN
1.0000 [drp] | Freq: Every day | OPHTHALMIC | Status: DC
  Administered 2016-01-27: 1 [drp] via OPHTHALMIC
  Filled 2016-01-27: qty 2.5

## 2016-01-27 MED ORDER — ONDANSETRON HCL 4 MG PO TABS: 4.0000 mg | ORAL_TABLET | Freq: Four times a day (QID) | ORAL | Status: DC | PRN

## 2016-01-27 MED ORDER — AMIODARONE HCL 100 MG PO TABS
100.0000 mg | ORAL_TABLET | Freq: Every morning | ORAL | Status: DC
  Administered 2016-01-28: 100 mg via ORAL
  Filled 2016-01-27: qty 1

## 2016-01-27 MED ORDER — PHENYLEPHRINE HCL 10 MG/ML IJ SOLN
INTRAMUSCULAR | Status: AC
  Filled 2016-01-27: qty 1

## 2016-01-27 MED ORDER — ACETAMINOPHEN 500 MG PO TABS
500.0000 mg | ORAL_TABLET | Freq: Four times a day (QID) | ORAL | Status: DC | PRN
Start: 2016-01-27 — End: 2016-01-28
  Administered 2016-01-27 – 2016-01-28 (×2): 500 mg via ORAL
  Filled 2016-01-27 (×2): qty 1

## 2016-01-27 MED ORDER — SODIUM CHLORIDE 0.9 % IJ SOLN
INTRAMUSCULAR | Status: AC
  Filled 2016-01-27: qty 10

## 2016-01-27 MED ORDER — FENTANYL CITRATE (PF) 100 MCG/2ML IJ SOLN
25.0000 ug | INTRAMUSCULAR | Status: DC | PRN
  Administered 2016-01-27 (×2): 50 ug via INTRAVENOUS

## 2016-01-27 MED ORDER — SERTRALINE HCL 50 MG PO TABS
50.0000 mg | ORAL_TABLET | Freq: Every day | ORAL | Status: DC
  Administered 2016-01-28: 50 mg via ORAL
  Filled 2016-01-27: qty 1

## 2016-01-27 MED ORDER — LACTATED RINGERS IV SOLN
INTRAVENOUS | Status: DC | PRN
  Administered 2016-01-27: 07:00:00 via INTRAVENOUS
  Administered 2016-01-27: 1000 mL

## 2016-01-27 MED ORDER — VITAMIN B-12 1000 MCG PO TABS
500.0000 ug | ORAL_TABLET | Freq: Every day | ORAL | Status: DC
  Administered 2016-01-28: 500 ug via ORAL
  Filled 2016-01-27: qty 1

## 2016-01-27 MED ORDER — SUGAMMADEX SODIUM 200 MG/2ML IV SOLN
INTRAVENOUS | Status: DC | PRN
  Administered 2016-01-27: 125 mg via INTRAVENOUS

## 2016-01-27 MED ORDER — SIMETHICONE 80 MG PO CHEW: 80.0000 mg | CHEWABLE_TABLET | Freq: Four times a day (QID) | ORAL | Status: DC | PRN

## 2016-01-27 MED ORDER — LABETALOL HCL 5 MG/ML IV SOLN
INTRAVENOUS | Status: AC
  Filled 2016-01-27: qty 4

## 2016-01-27 MED ORDER — BISACODYL 10 MG RE SUPP: 10.0000 mg | Freq: Every day | RECTAL | Status: DC | PRN

## 2016-01-27 MED ORDER — FENTANYL CITRATE (PF) 100 MCG/2ML IJ SOLN
INTRAMUSCULAR | Status: AC
  Filled 2016-01-27: qty 2

## 2016-01-27 MED ORDER — LISINOPRIL 10 MG PO TABS
10.0000 mg | ORAL_TABLET | Freq: Every morning | ORAL | Status: DC
  Administered 2016-01-28: 10 mg via ORAL
  Filled 2016-01-27: qty 1

## 2016-01-27 MED ORDER — ONDANSETRON HCL 4 MG/2ML IJ SOLN
INTRAMUSCULAR | Status: AC
  Filled 2016-01-27: qty 2

## 2016-01-27 MED ORDER — MIDAZOLAM HCL 2 MG/2ML IJ SOLN
INTRAMUSCULAR | Status: AC
  Filled 2016-01-27: qty 2

## 2016-01-27 MED ORDER — PANTOPRAZOLE SODIUM 40 MG PO TBEC
40.0000 mg | DELAYED_RELEASE_TABLET | Freq: Every day | ORAL | Status: DC
Start: 2016-01-28 — End: 2016-01-28
  Administered 2016-01-28: 40 mg via ORAL
  Filled 2016-01-27: qty 1

## 2016-01-27 MED ORDER — LIDOCAINE 2% (20 MG/ML) 5 ML SYRINGE
INTRAMUSCULAR | Status: AC
  Filled 2016-01-27: qty 5

## 2016-01-27 MED ORDER — OXYCODONE-ACETAMINOPHEN 5-325 MG PO TABS: 1.0000 | ORAL_TABLET | ORAL | Status: DC | PRN

## 2016-01-27 MED ORDER — PROPOFOL 10 MG/ML IV BOLUS
INTRAVENOUS | Status: DC | PRN
  Administered 2016-01-27 (×3): 20 mg via INTRAVENOUS
  Administered 2016-01-27: 100 mg via INTRAVENOUS
  Administered 2016-01-27: 20 mg via INTRAVENOUS

## 2016-01-27 MED ORDER — DEXAMETHASONE SODIUM PHOSPHATE 10 MG/ML IJ SOLN
INTRAMUSCULAR | Status: AC
  Filled 2016-01-27: qty 1

## 2016-01-27 MED ORDER — BRIMONIDINE TARTRATE 0.15 % OP SOLN
1.0000 [drp] | Freq: Three times a day (TID) | OPHTHALMIC | Status: DC
Start: 2016-01-27 — End: 2016-01-28
  Administered 2016-01-27: 1 [drp] via OPHTHALMIC
  Filled 2016-01-27: qty 5

## 2016-01-27 MED ORDER — DEXMEDETOMIDINE HCL IN NACL 200 MCG/50ML IV SOLN
INTRAVENOUS | Status: DC | PRN
  Administered 2016-01-27: 0.7 ug/kg/h via INTRAVENOUS

## 2016-01-27 MED ORDER — LABETALOL HCL 5 MG/ML IV SOLN
5.0000 mg | INTRAVENOUS | Status: DC | PRN
  Administered 2016-01-27 (×2): 5 mg via INTRAVENOUS

## 2016-01-27 MED ORDER — ATORVASTATIN CALCIUM 10 MG PO TABS
10.0000 mg | ORAL_TABLET | Freq: Every day | ORAL | Status: DC
  Administered 2016-01-27: 10 mg via ORAL
  Filled 2016-01-27: qty 1

## 2016-01-27 MED ORDER — ASPIRIN EC 81 MG PO TBEC
81.0000 mg | DELAYED_RELEASE_TABLET | Freq: Every day | ORAL | Status: DC
  Administered 2016-01-28: 81 mg via ORAL
  Filled 2016-01-27: qty 1

## 2016-01-27 MED ORDER — IBUPROFEN 200 MG PO TABS: 600.0000 mg | ORAL_TABLET | Freq: Four times a day (QID) | ORAL | Status: DC | PRN

## 2016-01-27 MED ORDER — PROPOFOL 10 MG/ML IV BOLUS
INTRAVENOUS | Status: AC
  Filled 2016-01-27: qty 20

## 2016-01-27 MED ORDER — ONDANSETRON HCL 4 MG/2ML IJ SOLN: 4.0000 mg | Freq: Four times a day (QID) | INTRAMUSCULAR | Status: DC | PRN

## 2016-01-27 MED ORDER — CEFAZOLIN SODIUM-DEXTROSE 2-4 GM/100ML-% IV SOLN
INTRAVENOUS | Status: AC
  Filled 2016-01-27: qty 100

## 2016-01-27 MED ORDER — CEFAZOLIN SODIUM-DEXTROSE 2-4 GM/100ML-% IV SOLN
2.0000 g | INTRAVENOUS | Status: AC
  Administered 2016-01-27: 2 g via INTRAVENOUS
  Filled 2016-01-27: qty 100

## 2016-01-27 MED ORDER — DEXAMETHASONE SODIUM PHOSPHATE 10 MG/ML IJ SOLN
INTRAMUSCULAR | Status: DC | PRN
  Administered 2016-01-27: 5 mg via INTRAVENOUS

## 2016-01-27 MED ORDER — SODIUM CHLORIDE 0.9 % IR SOLN
Status: DC | PRN
  Administered 2016-01-27: 1000 mL

## 2016-01-27 MED ORDER — METOPROLOL TARTRATE 25 MG PO TABS
25.0000 mg | ORAL_TABLET | Freq: Two times a day (BID) | ORAL | Status: DC
  Administered 2016-01-27 – 2016-01-28 (×2): 25 mg via ORAL
  Filled 2016-01-27 (×2): qty 1

## 2016-01-27 MED ORDER — ENOXAPARIN SODIUM 40 MG/0.4ML ~~LOC~~ SOLN
40.0000 mg | SUBCUTANEOUS | Status: DC
  Administered 2016-01-28: 40 mg via SUBCUTANEOUS
  Filled 2016-01-27: qty 0.4

## 2016-01-27 MED ORDER — HYDRALAZINE HCL 20 MG/ML IJ SOLN
INTRAMUSCULAR | Status: DC | PRN
  Administered 2016-01-27: 2 mg via INTRAVENOUS

## 2016-01-27 SURGICAL SUPPLY — 67 items
APPLICATOR SURGIFLO ENDO (HEMOSTASIS) IMPLANT
BAG LAPAROSCOPIC 12 15 PORT 16 (BASKET) IMPLANT
BAG RETRIEVAL 12/15 (BASKET)
CHLORAPREP W/TINT 26ML (MISCELLANEOUS) ×2 IMPLANT
COVER SURGICAL LIGHT HANDLE (MISCELLANEOUS) ×2 IMPLANT
COVER TIP SHEARS 8 DVNC (MISCELLANEOUS) ×1 IMPLANT
COVER TIP SHEARS 8MM DA VINCI (MISCELLANEOUS) ×1
DERMABOND ADVANCED (GAUZE/BANDAGES/DRESSINGS) ×1
DERMABOND ADVANCED .7 DNX12 (GAUZE/BANDAGES/DRESSINGS) ×1 IMPLANT
DRAPE ARM DVNC X/XI (DISPOSABLE) ×4 IMPLANT
DRAPE COLUMN DVNC XI (DISPOSABLE) ×1 IMPLANT
DRAPE DA VINCI XI ARM (DISPOSABLE) ×4
DRAPE DA VINCI XI COLUMN (DISPOSABLE) ×1
DRAPE SHEET LG 3/4 BI-LAMINATE (DRAPES) ×4 IMPLANT
DRAPE SURG IRRIG POUCH 19X23 (DRAPES) ×2 IMPLANT
ELECT REM PT RETURN 15FT ADLT (MISCELLANEOUS) ×2 IMPLANT
GAUZE SPONGE 4X4 16PLY XRAY LF (GAUZE/BANDAGES/DRESSINGS) ×2 IMPLANT
GLOVE BIO SURGEON STRL SZ 6 (GLOVE) ×8 IMPLANT
GLOVE BIO SURGEON STRL SZ 6.5 (GLOVE) ×4 IMPLANT
GOWN STRL REUS W/ TWL LRG LVL3 (GOWN DISPOSABLE) ×2 IMPLANT
GOWN STRL REUS W/TWL LRG LVL3 (GOWN DISPOSABLE) ×2
HOLDER FOLEY CATH W/STRAP (MISCELLANEOUS) ×2 IMPLANT
IRRIG SUCT STRYKERFLOW 2 WTIP (MISCELLANEOUS) ×2
IRRIGATION SUCT STRKRFLW 2 WTP (MISCELLANEOUS) ×1 IMPLANT
KIT BASIN OR (CUSTOM PROCEDURE TRAY) ×2 IMPLANT
KIT PROCEDURE DA VINCI SI (MISCELLANEOUS)
KIT PROCEDURE DVNC SI (MISCELLANEOUS) IMPLANT
MANIPULATOR UTERINE 4.5 ZUMI (MISCELLANEOUS) ×2 IMPLANT
MARKER SKIN DUAL TIP RULER LAB (MISCELLANEOUS) ×2 IMPLANT
NDL SAFETY ECLIPSE 18X1.5 (NEEDLE) ×1 IMPLANT
NEEDLE HYPO 18GX1.5 SHARP (NEEDLE) ×1
NEEDLE SPNL 18GX3.5 QUINCKE PK (NEEDLE) ×2 IMPLANT
OBTURATOR OPTICAL STANDARD 8MM (TROCAR) ×1
OBTURATOR OPTICAL STND 8 DVNC (TROCAR) ×1
OBTURATOR OPTICALSTD 8 DVNC (TROCAR) ×1 IMPLANT
OCCLUDER COLPOPNEUMO (BALLOONS) ×2 IMPLANT
PACKING VAGINAL (PACKING) ×2 IMPLANT
PAD POSITIONING PINK XL (MISCELLANEOUS) ×2 IMPLANT
PORT ACCESS TROCAR AIRSEAL 12 (TROCAR) ×1 IMPLANT
PORT ACCESS TROCAR AIRSEAL 5M (TROCAR) ×1
POUCH SPECIMEN RETRIEVAL 10MM (ENDOMECHANICALS) IMPLANT
SEAL CANN UNIV 5-8 DVNC XI (MISCELLANEOUS) ×4 IMPLANT
SEAL XI 5MM-8MM UNIVERSAL (MISCELLANEOUS) ×4
SET TRI-LUMEN FLTR TB AIRSEAL (TUBING) ×2 IMPLANT
SHEET LAVH (DRAPES) ×2 IMPLANT
SLEEVE SURGEON STRL (DRAPES) ×2 IMPLANT
SOLUTION ELECTROLUBE (MISCELLANEOUS) ×2 IMPLANT
SURGIFLO W/THROMBIN 8M KIT (HEMOSTASIS) IMPLANT
SUT MNCRL AB 4-0 PS2 18 (SUTURE) ×4 IMPLANT
SUT VIC AB 0 CT1 27 (SUTURE) ×2
SUT VIC AB 0 CT1 27XBRD ANTBC (SUTURE) ×2 IMPLANT
SUT VIC AB 2-0 SH 27 (SUTURE) ×5
SUT VIC AB 2-0 SH 27X BRD (SUTURE) ×5 IMPLANT
SUT VIC AB 3-0 SH 27 (SUTURE) ×2
SUT VIC AB 3-0 SH 27XBRD (SUTURE) ×2 IMPLANT
SYR 10ML LL (SYRINGE) ×2 IMPLANT
SYR 50ML LL SCALE MARK (SYRINGE) ×2 IMPLANT
TOWEL OR 17X26 10 PK STRL BLUE (TOWEL DISPOSABLE) ×4 IMPLANT
TOWEL OR NON WOVEN STRL DISP B (DISPOSABLE) ×2 IMPLANT
TRAP SPECIMEN MUCOUS 40CC (MISCELLANEOUS) IMPLANT
TRAY FOLEY W/METER SILVER 16FR (SET/KITS/TRAYS/PACK) ×2 IMPLANT
TRAY LAPAROSCOPIC (CUSTOM PROCEDURE TRAY) ×2 IMPLANT
TROCAR BLADELESS OPT 5 100 (ENDOMECHANICALS) ×2 IMPLANT
UNDERPAD 30X30 (UNDERPADS AND DIAPERS) ×2 IMPLANT
UNDERPAD 30X30 INCONTINENT (UNDERPADS AND DIAPERS) ×2 IMPLANT
WATER STERILE IRR 1500ML POUR (IV SOLUTION) ×4 IMPLANT
YANKAUER SUCT BULB TIP 10FT TU (MISCELLANEOUS) ×2 IMPLANT

## 2016-01-27 NOTE — Progress Notes (Signed)
Removed vaginal packing. No active bleeding noted.  [name], MD

## 2016-01-27 NOTE — Anesthesia Procedure Notes (Signed)
Procedure Name: Intubation Date/Time: 01/27/2016 7:46 AM Performed by: Cynda Familia Pre-anesthesia Checklist: Patient identified, Emergency Drugs available, Suction available and Patient being monitored Patient Re-evaluated:Patient Re-evaluated prior to inductionOxygen Delivery Method: Circle System Utilized Preoxygenation: Pre-oxygenation with 100% oxygen Intubation Type: IV induction Ventilation: Mask ventilation without difficulty Laryngoscope Size: Miller and 2 Grade View: Grade I Tube type: Oral Tube size: 7.0 mm Number of attempts: 1 Airway Equipment and Method: Stylet Placement Confirmation: ETT inserted through vocal cords under direct vision,  positive ETCO2 and breath sounds checked- equal and bilateral Secured at: 22 cm Tube secured with: Tape Dental Injury: Teeth and Oropharynx as per pre-operative assessment  Comments: Smooth IV induction [place]--  Intubation AM CRNA atraumatic--- teeth and mouth as preop--- bilat BS Ola Spurr

## 2016-01-27 NOTE — H&P (View-Only) (Signed)
Follow-up Note: Gyn-Onc  Consult was initially requested by Dr. Berenice Primas for the evaluation of Sheri Hensley 83 y.o. female  CC:  Chief Complaint  Patient presents with  . endometrial cancer    Assessment/Plan:  Sheri Hensley  is a 83 y.o.  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  She was recommended to stop plavix 5 days preop and continue baby aspirin through surgery. I discussed with the patient and her daughter that being on antiplatelet therapy perioperatively substantially increases her bleeding risk. I have concerns about such a short window on the plavis and would recommend a 10 day window preoperatively of no plavix, and continuation of baby ASA throughout. 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  However, given her cancer diagnosis, there is not a good option for avoiding risk.  We will contact Sheri Hensley later in the week when we have finalized her OR time. We will then inform her of the date to stop her Plavix.  We will not perform lymphadenectomy given her high risk for bleeding on anticoagulation therapy and advanced age.  HPI: Sheri Hensley is a 83 year old woman (G1P1) who is seen in consultation at the request of Dr Berenice Primas due to post-menopausal bleeding.  The patient has a history of postmenopausal bleeding since early September 2017. It was initially light spotting, then became slightly more persistent over time.  She was seen by Dr Berenice Primas on 10/20/15 where a TVUS was performed and demonstrated a uterus measuring 6.25x5.2x4.19cm but with a thickened endometrium of 2cm. The notes document that an office biopsy was not possible due to narrowed vagina, an Dr Berenice Primas did not think she would be able to accomplish a D&C in the  operating room.  The patient is relatively healthy for her advanced age. She has a history of an arrythmia for which she takes amiodarone. She also takes Plavix for a presumed TIA. She otherwise has a medical history significant for a prior appendectomy and 1 prior vaginal delivery.   Interval Hx:  On 11/20/15 she underwent D&C and pathology revealed grade 2 endomettrioid endometrial adenocarcinoma.  On the morning of her planned surgical date on 12/23/15 she was diagnosed with a TIA.  She has resolved all symptoms (right hand weakness). She was restarted on Plavix and baby ASA.   Current Meds:  Outpatient Encounter Prescriptions as of 12/31/2015  Medication Sig  . acetaminophen (TYLENOL) 500 MG tablet Take 500 mg by mouth every 6 (six) hours as needed for moderate pain or headache.  . ALPRAZolam (XANAX) 0.25 MG tablet Take 0.125-0.25 mg by mouth daily as needed for anxiety.   Marland Kitchen amiodarone (PACERONE) 200 MG tablet Take 100 mg by mouth every morning.   Marland Kitchen atorvastatin (LIPITOR) 10 MG tablet Take 10 mg by mouth at bedtime.  . betamethasone acetate-betamethasone sodium phosphate (CELESTONE) 6 (3-3) MG/ML injection Inject 12 mg into the articular space every 3 (three) months.  . bimatoprost (LUMIGAN) 0.01 % SOLN Place 1 drop into both eyes at bedtime.  . brimonidine (ALPHAGAN P) 0.1 % SOLN Place 1 drop into both eyes every 8 (eight) hours.   . clopidogrel (PLAVIX) 75 MG tablet Take 1 tablet (75 mg total) by mouth daily.  . cyanocobalamin (V-R VITAMIN B-12) 500 MCG tablet Take 500 mcg by  mouth daily.   Marland Kitchen lisinopril (PRINIVIL,ZESTRIL) 10 MG tablet Take 10 mg by mouth every morning.   . loratadine (CLARITIN) 10 MG tablet Take 10 mg by mouth daily as needed for allergies.  . Melatonin 10 MG CAPS Take 10 mg by mouth at bedtime.   . metoprolol tartrate (LOPRESSOR) 25 MG tablet Take 25 mg by mouth 2 (two) times daily.   . Multiple Vitamins-Minerals (CENTRUM VITAMINTS PO) Take 1 tablet by mouth every  morning.  . Multiple Vitamins-Minerals (ICAPS AREDS 2) CAPS Take 1 tablet by mouth daily.  Marland Kitchen omeprazole (PRILOSEC) 20 MG capsule Take 20 mg by mouth every morning.  . sertraline (ZOLOFT) 50 MG tablet Take 50 mg by mouth daily.   No facility-administered encounter medications on file as of 12/31/2015.     Allergy:  Allergies  Allergen Reactions  . Flagyl [Metronidazole] Rash    Social Hx:   Social History   Social History  . Marital status: Widowed    Spouse name: N/A  . Number of children: N/A  . Years of education: N/A   Occupational History  . Not on file.   Social History Main Topics  . Smoking status: Never Smoker  . Smokeless tobacco: Never Used  . Alcohol use Not on file     Comment: occasional  . Drug use: Unknown  . Sexual activity: Not on file   Other Topics Concern  . Not on file   Social History Narrative  . No narrative on file    Past Surgical Hx:  Past Surgical History:  Procedure Laterality Date  . APPENDECTOMY  1954  . CATARACT EXTRACTION W/ INTRAOCULAR LENS  IMPLANT, BILATERAL  1994  . colonscopy    . DILATION AND CURETTAGE OF UTERUS N/A 11/20/2015   Procedure: DILATATION AND CURETTAGE;  Surgeon: Everitt Amber, MD;  Location: [facility];  Service: Gynecology;  Laterality: N/A;  . DILATION AND CURETTAGE, DIAGNOSTIC / THERAPEUTIC N/A novmber 2017   per daughter with biopsy as stated  . KNEE ARTHROSCOPY Right 2002  . MASTECTOMY Left 1998   w/ lympn node dissection's    Past Medical Hx:  Past Medical History:  Diagnosis Date  . Cancer ([place]) 1998   left tamoxifem x 5 yrs  . Coronary artery disease    CARDIOLOGIST-  DR Geraldo Pitter ([facility])  . Endometrial cancer ([place])   . GERD (gastroesophageal reflux disease)   . Glaucoma, both eyes   . History of breast cancer    1998--  s/p  left mastectomy with sln dissection's  ,  no chemo or radiation-  no recurrence  . History of Clostridium difficile infection 5-6 yrs  ago  . History of TIAs    yrs ago ?   Marland Kitchen Hyperlipidemia   . Hypertension   . Macular degeneration   . Narrow complex tachycardia [facility])    cardiologist-  dr Salley Scarlet  . OA (osteoarthritis)    right knee  . PMB (postmenopausal bleeding)   . PONV (postoperative nausea and vomiting)    ponv after appendectomy 1954  . Thickened endometrium   . Wears glasses     Past Gynecological History:  G1 (SVD) No LMP recorded. Patient is postmenopausal.  Family Hx:  Family History  Problem Relation Age of Onset  . Hypertension Mother   . Hypertension Father     Review of Systems:  Constitutional  Feels well,    ENT Normal appearing ears and nares bilaterally Skin/Breast  No rash, sores,  jaundice, itching, dryness Cardiovascular  No chest pain, shortness of breath, or edema  Pulmonary  No cough or wheeze.  Gastro Intestinal  No nausea, vomitting, or diarrhoea. No bright red blood per rectum, no abdominal pain, change in bowel movement, or constipation.  Genito Urinary  No frequency, urgency, dysuria, + postmenopausal bleeding Musculo Skeletal  No myalgia, arthralgia, joint swelling or pain  Neurologic  No weakness, numbness, change in gait,  Psychology  No depression, anxiety, insomnia.   Vitals:  Blood pressure (!) 211/66, pulse (!) 56, temperature 98.7 F (37.1 C), temperature source Oral, resp. rate 16, height 5\' 4"  (1.626 m), weight 130 lb 11.2 oz (59.3 kg), SpO2 98 %.  Physical Exam: WD in NAD Neck  Supple NROM, without any enlargements.  Lymph Node Survey No cervical supraclavicular or inguinal adenopathy Cardiovascular  Pulse normal rate, regularity and rhythm. S1 and S2 normal.  Lungs  Clear to auscultation bilateraly, without wheezes/crackles/rhonchi. Good air movement.  Skin  No rash/lesions/breakdown  Psychiatry  Alert and oriented to person, place, and time  Abdomen  Normoactive bowel sounds, abdomen soft, non-tender and thin without evidence of hernia.   Back No CVA tenderness Genito Urinary  Vulva/vagina: Normal external female genitalia.  No lesions. No discharge or bleeding.  Bladder/urethra:  No lesions or masses, well supported bladder  Vagina: narrow but permits pederson speculum and a normal appearing cervix is seen clearly. The vaginal walls are smooth and atrophic and free of lesions.  Cervix: Normal appearing, no lesions. Stenotic - unsuccessful attempt at office biopsy  Uterus: Small, mobile, no parametrial involvement or nodularity.  Adnexa: no palpable masses. Rectal  deferred Extremities  No bilateral cyanosis, clubbing or edema.  20 minutes of face to face counseling was spent with the patient.  [name], MD  12/31/2015, 12:21 PM

## 2016-01-27 NOTE — Transfer of Care (Signed)
Immediate Anesthesia Transfer of Care Note  Patient: Sheri Hensley  Procedure(s) Performed: Procedure(s): XI ROBOTIC ASSISTED TOTAL HYSTERECTOMY WITH BILATERAL SALPINGO OOPHORECTOMY (Bilateral)  Patient Location: PACU  Anesthesia Type:General  Level of Consciousness: awake and alert   Airway & Oxygen Therapy: Patient Spontanous Breathing and Patient connected to face mask oxygen  Post-op Assessment: Report given to RN and Post -op Vital signs reviewed and stable  Post vital signs: Reviewed and stable  Last Vitals:  Vitals:   01/27/16 0527  BP: (!) 152/67  Pulse: (!) 57  Resp: 16  Temp: 36.3 C    Last Pain:  Vitals:   01/27/16 0527  TempSrc: Oral         Complications: No apparent anesthesia complications

## 2016-01-27 NOTE — Interval H&P Note (Signed)
History and Physical Interval Note:  01/27/2016 6:40 AM  Sheri Hensley  has presented today for surgery, with the diagnosis of endometrial cancer  The various methods of treatment have been discussed with the patient and family. After consideration of risks, benefits and other options for treatment, the patient has consented to  Procedure(s): XI ROBOTIC ASSISTED TOTAL HYSTERECTOMY WITH BILATERAL SALPINGO OOPHORECTOMY (Bilateral) as a surgical intervention .  The patient's history has been reviewed, patient examined, no change in status, stable for surgery.  I have reviewed the patient's chart and labs.  Questions were answered to the patient's satisfaction.     [name]

## 2016-01-27 NOTE — Op Note (Signed)
OPERATIVE NOTE 01/27/16  Surgeon: [name]   Assistants: Dr Janie Morning (an MD assistant was necessary for tissue manipulation, management of robotic instrumentation, retraction and positioning due to the complexity of the case and hospital policies).   Anesthesia: General endotracheal anesthesia  ASA Class: 3   Pre-operative Diagnosis: grade 2 endometrial cancer  Post-operative Diagnosis: same  Operation: Robotic-assisted laparoscopic total hysterectomy with bilateral salpingoophorectomy   Surgeon: [name]  Assistant Surgeon: Lahoma Crocker MD  Anesthesia: GET  Urine Output: 500  Operative Findings:  : 9cm fibroid/arcuate shaped uterus, normal tubes and ovaries, diverticulosis with adhesions between sigmoid colon and sidewall. Vaginal incision extended along right vaginal sidewall requiring additional sutures.  Estimated Blood Loss:  less than 100 mL      Total IV Fluids: 700 ml         Specimens: uterus, cervix, bilateral tubes and ovaries         Complications:  None; patient tolerated the procedure well.         Disposition: PACU - hemodynamically stable.  Procedure Details  The patient was seen in the Holding Room. The risks, benefits, complications, treatment options, and expected outcomes were discussed with the patient.  The patient concurred with the proposed plan, giving informed consent.  The site of surgery properly noted/marked. The patient was identified as Sheri Hensley and the procedure verified as a Robotic-assisted hysterectomy with bilateral salpingo oophorectomy. A Time Out was held and the above information confirmed.  After induction of anesthesia, the patient was draped and prepped in the usual sterile manner. Pt was placed in supine position after anesthesia and draped and prepped in the usual sterile manner. The abdominal drape was placed after the CholoraPrep had been allowed to dry for 3 minutes.  Her arms were  tucked to her side with all appropriate precautions.  The shoulders were stabilized with padded shoulder blocks applied to the acromium processes.  The patient was placed in the semi-lithotomy position in [place].  The perineum was prepped with Betadine. The patient was then prepped. Foley catheter was placed.  A sterile speculum was placed in the vagina.  The cervix was grasped with a single-tooth tenaculum and dilated with Kennon Rounds dilators.  The ZUMI uterine manipulator with a medium colpotomizer ring was placed without difficulty.  A pneum occluder balloon was placed over the manipulator.  OG tube placement was confirmed and to suction.   Next, a 5 mm skin incision was made 1 cm below the subcostal margin in the midclavicular line.  The 5 mm Optiview port and scope was used for direct entry.  Opening pressure was under 10 mm CO2.  The abdomen was insufflated and the findings were noted as above.   At this point and all points during the procedure, the patient's intra-abdominal pressure did not exceed 15 mmHg. Next, a 10 mm skin incision was made in the umbilicus and a right and left port was placed about 10 cm lateral to the robot port on the right and left side.  The patient was placed in steep Trendelenburg.  Bowel was folded away into the upper abdomen.  The robot was docked in the normal manner.  The hysterectomy was started after the round ligament on the right side was incised and the retroperitoneum was entered and the pararectal space was developed.  The ureter was noted to be on the medial leaf of the broad ligament.  The peritoneum above the ureter was incised and stretched  and the infundibulopelvic ligament was skeletonized, cauterized and cut.  The posterior peritoneum was taken down to the level of the KOH ring.  The anterior peritoneum was also taken down.  The bladder flap was created to the level of the KOH ring.  The uterine artery on the right side was skeletonized, cauterized and cut  in the normal manner.  A similar procedure was performed on the left.  The colpotomy was made and the uterus, cervix, bilateral ovaries and tubes were amputated and delivered through the vagina.  Pedicles were inspected and excellent hemostasis was achieved.    The colpotomy at the vaginal cuff was closed with Vicryl on a CT1 needle in a running manner.  Irrigation was used and excellent hemostasis was achieved.  At this point in the procedure was completed.  Robotic instruments were removed under direct visulaization.  The robot was undocked. The 10 mm ports were closed with Vicryl on a UR-5 needle and the fascia was closed with 0 Vicryl on a UR-5 needle.  The skin was closed with 4-0 Vicryl in a subcuticular manner.  Dermabond was applied.  Sponge, lap and needle counts correct x 2.   The vagina was swabbed and it was noted that there was extension of the vaginal incision along the right vaginal sidewall which required additional suture placement for hemostasis. A midline perineal incision was closed with 3-0vicryl. Vaginal packing was placed to reinforce hemostasis. All instrument and needle counts were correct x  3.   The patient was transferred to the recovery room in a stable condition.  [name], MD

## 2016-01-27 NOTE — Anesthesia Postprocedure Evaluation (Signed)
Anesthesia Post Note  Patient: [name]  Procedure(s) Performed: Procedure(s) (LRB): XI ROBOTIC ASSISTED TOTAL HYSTERECTOMY WITH BILATERAL SALPINGO OOPHORECTOMY (Bilateral)  Patient location during evaluation: PACU Anesthesia Type: General Level of consciousness: awake and alert Pain management: pain level controlled Vital Signs Assessment: post-procedure vital signs reviewed and stable Respiratory status: spontaneous breathing, nonlabored ventilation, respiratory function stable and patient connected to nasal cannula oxygen Cardiovascular status: blood pressure returned to baseline and stable Postop Assessment: no signs of nausea or vomiting Anesthetic complications: no       Last Vitals:  Vitals:   01/27/16 1230 01/27/16 1357  BP: (!) 140/55 (!) 138/48  Pulse: 67 65  Resp: 14 14  Temp: 36.7 C 37.2 C    Last Pain:  Vitals:   01/27/16 1357  TempSrc: Oral  PainSc:                  Tiajuana Amass

## 2016-01-28 DIAGNOSIS — H409 Unspecified glaucoma: Secondary | ICD-10-CM | POA: Diagnosis not present

## 2016-01-28 DIAGNOSIS — M25511 Pain in right shoulder: Secondary | ICD-10-CM | POA: Diagnosis not present

## 2016-01-28 DIAGNOSIS — K219 Gastro-esophageal reflux disease without esophagitis: Secondary | ICD-10-CM | POA: Diagnosis not present

## 2016-01-28 DIAGNOSIS — D259 Leiomyoma of uterus, unspecified: Secondary | ICD-10-CM | POA: Diagnosis not present

## 2016-01-28 DIAGNOSIS — I2510 Atherosclerotic heart disease of native coronary artery without angina pectoris: Secondary | ICD-10-CM | POA: Diagnosis not present

## 2016-01-28 DIAGNOSIS — E785 Hyperlipidemia, unspecified: Secondary | ICD-10-CM | POA: Diagnosis not present

## 2016-01-28 DIAGNOSIS — N800 Endometriosis of uterus: Secondary | ICD-10-CM | POA: Diagnosis not present

## 2016-01-28 DIAGNOSIS — Y836 Removal of other organ (partial) (total) as the cause of abnormal reaction of the patient, or of later complication, without mention of misadventure at the time of the procedure: Secondary | ICD-10-CM | POA: Diagnosis not present

## 2016-01-28 DIAGNOSIS — N950 Postmenopausal bleeding: Secondary | ICD-10-CM | POA: Diagnosis not present

## 2016-01-28 DIAGNOSIS — C541 Malignant neoplasm of endometrium: Secondary | ICD-10-CM | POA: Diagnosis not present

## 2016-01-28 LAB — BASIC METABOLIC PANEL
Anion gap: 7 (ref 5–15)
BUN: 12 mg/dL (ref 6–20)
CHLORIDE: 105 mmol/L (ref 101–111)
CO2: 25 mmol/L (ref 22–32)
Calcium: 8.8 mg/dL — ABNORMAL LOW (ref 8.9–10.3)
Creatinine, Ser: 0.79 mg/dL (ref 0.44–1.00)
GFR calc Af Amer: >60 mL/min (ref >60)
GFR calc non Af Amer: >60 mL/min (ref >60)
Glucose, Bld: 116 mg/dL — ABNORMAL HIGH (ref 65–99)
Potassium: 4.3 mmol/L (ref 3.5–5.1)
SODIUM: 137 mmol/L (ref 135–145)

## 2016-01-28 LAB — CBC
HEMATOCRIT: 32.4 % — AB (ref 36.0–46.0)
HEMOGLOBIN: 10.8 g/dL — AB (ref 12.0–15.0)
MCH: 29.8 pg (ref 26.0–34.0)
MCHC: 33.3 g/dL (ref 30.0–36.0)
MCV: 89.5 fL (ref 78.0–100.0)
Platelets: 155 10*3/uL (ref 150–400)
RBC: 3.62 MIL/uL — ABNORMAL LOW (ref 3.87–5.11)
RDW: 14.3 % (ref 11.5–15.5)
WBC: 14.5 10*3/uL — ABNORMAL HIGH (ref 4.0–10.5)

## 2016-01-28 MED ORDER — TRAMADOL HCL 50 MG PO TABS: 50.0000 mg | ORAL_TABLET | Freq: Four times a day (QID) | ORAL | 0 refills | Status: DC | PRN

## 2016-01-28 MED ORDER — IBUPROFEN 600 MG PO TABS: 600.0000 mg | ORAL_TABLET | Freq: Four times a day (QID) | ORAL | 0 refills | Status: DC | PRN

## 2016-01-28 MED ORDER — ASPIRIN 81 MG PO TBEC: 81.0000 mg | DELAYED_RELEASE_TABLET | Freq: Every day | ORAL | 11 refills | Status: DC

## 2016-01-28 MED ORDER — SENNA 8.6 MG PO TABS: 1.0000 | ORAL_TABLET | Freq: Every day | ORAL | 0 refills | Status: DC

## 2016-01-28 NOTE — Progress Notes (Signed)
Ambulate 30 ft  With a rolling walker

## 2016-01-28 NOTE — Progress Notes (Signed)
Patient is being discharged home. Discharge instructions were given to patient and family 

## 2016-01-28 NOTE — Discharge Summary (Signed)
Physician Discharge Summary  Patient ID: Sheri Hensley MRN: [id number] DOB/AGE: 81-Mar-1921 81 y.o.  Admit date: 01/27/2016 Discharge date: 01/28/2016  Admission Diagnoses: Endometrial cancer [facility])  Discharge Diagnoses:  Principal Problem:   Endometrial cancer [facility])   Discharged Condition: good  Hospital Course:  1/ patient was admitted on 01/27/15 for robotic assisted total hysterectomy and BSO with vaginal repair of laceration. 2/ surgery was uncomplicated and she lost very little blood 3/ She developed right shoulder tip pain on POD 0 consistent with retained gas (intraperitoneal) 4/ meeting discharge criteria on POD1  Consults: None  Significant Diagnostic Studies: labs:  CBC    Component Value Date/Time   WBC 14.5 (H) 01/28/2016 0527   RBC 3.62 (L) 01/28/2016 0527   HGB 10.8 (L) 01/28/2016 0527   HCT 32.4 (L) 01/28/2016 0527   PLT 155 01/28/2016 0527   MCV 89.5 01/28/2016 0527   MCH 29.8 01/28/2016 0527   MCHC 33.3 01/28/2016 0527   RDW 14.3 01/28/2016 0527   LYMPHSABS 1.1 01/21/2016 1104   MONOABS 0.6 01/21/2016 1104   EOSABS 0.1 01/21/2016 1104   BASOSABS 0.0 01/21/2016 1104    Treatments: surgery: see above  Discharge Exam: Blood pressure (!) 159/50, pulse 64, temperature 99 F (37.2 C), temperature source Oral, resp. rate 16, SpO2 96 %. General appearance: alert and cooperative Cardio: regular rate and rhythm, S1, S2 normal, no murmur, click, rub or gallop GI: soft, non-tender; bowel sounds normal; no masses,  no organomegaly Incision/Wound: clean and intact (bandage on LUQ incision). Vagina - no bleeding on pad  Disposition: 01-Home or Self Care  Discharge Instructions    (HEART FAILURE PATIENTS) Call MD:  Anytime you have any of the following symptoms: 1) 3 pound weight gain in 24 hours or 5 pounds in 1 week 2) shortness of breath, with or without a dry hacking cough 3) swelling in the hands, feet or stomach 4) if you have to sleep on extra pillows  at night in order to breathe.    Complete by:  As directed    Call MD for:  difficulty breathing, headache or visual disturbances    Complete by:  As directed    Call MD for:  extreme fatigue    Complete by:  As directed    Call MD for:  hives    Complete by:  As directed    Call MD for:  persistant dizziness or light-headedness    Complete by:  As directed    Call MD for:  persistant nausea and vomiting    Complete by:  As directed    Call MD for:  redness, tenderness, or signs of infection (pain, swelling, redness, odor or green/yellow discharge around incision site)    Complete by:  As directed    Call MD for:  severe uncontrolled pain    Complete by:  As directed    Call MD for:  temperature >100.4    Complete by:  As directed    Diet - low sodium heart healthy    Complete by:  As directed    Diet general    Complete by:  As directed    Driving Restrictions    Complete by:  As directed    No driving for 7 days or until off narcotic pain medication   Increase activity slowly    Complete by:  As directed    Remove dressing in 24 hours    Complete by:  As directed    Sexual  Activity Restrictions    Complete by:  As directed    No intercourse for 6 weeks     Allergies as of 01/28/2016      Reactions   Ativan [lorazepam]    "HALLUCINATIONS"   Flagyl [metronidazole] Rash      Medication List    TAKE these medications   acetaminophen 500 MG tablet Commonly known as:  TYLENOL Take 500 mg by mouth every 6 (six) hours as needed for moderate pain or headache.   ALPHAGAN P 0.1 % Soln Generic drug:  brimonidine Place 1 drop into both eyes every 8 (eight) hours.   ALPRAZolam 0.25 MG tablet Commonly known as:  XANAX Take 0.125-0.25 mg by mouth daily as needed for anxiety.   amiodarone 200 MG tablet Commonly known as:  PACERONE Take 100 mg by mouth every morning.   aspirin 81 MG EC tablet Take 1 tablet (81 mg total) by mouth daily.   atorvastatin 10 MG  tablet Commonly known as:  LIPITOR Take 10 mg by mouth at bedtime.   betamethasone acetate-betamethasone sodium phosphate 6 (3-3) MG/ML injection Commonly known as:  CELESTONE Inject 12 mg into the articular space every 3 (three) months. Next injection due end of January   CENTRUM VITAMINTS PO Take 1 tablet by mouth every morning.   ICAPS AREDS 2 Caps Take 1 tablet by mouth 2 (two) times daily.   clopidogrel 75 MG tablet Commonly known as:  PLAVIX Take 1 tablet (75 mg total) by mouth daily. What changed:  additional instructions   ibuprofen 600 MG tablet Commonly known as:  ADVIL,MOTRIN Take 1 tablet (600 mg total) by mouth every 6 (six) hours as needed (mild pain).   lisinopril 10 MG tablet Commonly known as:  PRINIVIL,ZESTRIL Take 10 mg by mouth every morning.   loratadine 10 MG tablet Commonly known as:  CLARITIN Take 10 mg by mouth daily as needed for allergies.   LUMIGAN 0.01 % Soln Generic drug:  bimatoprost Place 1 drop into both eyes at bedtime.   Melatonin 10 MG Caps Take 10 mg by mouth at bedtime.   metoprolol tartrate 25 MG tablet Commonly known as:  LOPRESSOR Take 25 mg by mouth 2 (two) times daily.   omeprazole 20 MG capsule Commonly known as:  PRILOSEC Take 20 mg by mouth every morning.   senna 8.6 MG Tabs tablet Commonly known as:  SENOKOT Take 1 tablet (8.6 mg total) by mouth at bedtime.   sertraline 50 MG tablet Commonly known as:  ZOLOFT Take 50 mg by mouth daily.   traMADol 50 MG tablet Commonly known as:  ULTRAM Take 1 tablet (50 mg total) by mouth every 6 (six) hours as needed for severe pain.   V-R VITAMIN B-12 500 MCG tablet Generic drug:  cyanocobalamin Take 500 mcg by mouth daily.      Follow-up Information    [name], MD In 3 weeks.   Specialty:  Obstetrics and Gynecology Contact information: [place] [address] [phone]           Signed: [name] 01/28/2016, 9:23  AM

## 2016-01-28 NOTE — Discharge Instructions (Signed)
01/27/2016  Return to work: 6 weeks  Activity: 1. Be up and out of the bed during the day.  Take a nap if needed.  You may walk up steps but be careful and use the hand rail.  Stair climbing will tire you more than you think, you may need to stop part way and rest.   2. No lifting or straining for 6 weeks.  3. No driving for 2 weeks.  Do Not drive if you are taking narcotic pain medicine.  4. Shower daily.  Use soap and water on your incision and pat dry; don't rub.   5. No sexual activity and nothing in the vagina for 8 weeks.  Diet: 1. Low sodium Heart Healthy Diet is recommended.  2. It is safe to use a laxative if you have difficulty moving your bowels.   Wound Care: 1. Keep clean and dry.  Shower daily.  Medications:  Take tylenol or ibuprofen every 6 hours for pain control. If your pain is severe and not helped by these medications you can try tramadol. Take sennakot every night to maintain regular bowel movements. You can stop this if you develop diarrhea or after 2 weeks. Continue to take aspirin 81mg  every day. Do not resume Plavix until 02/03/15.  Reasons to call the Doctor:   Fever - Oral temperature greater than 100.4 degrees Fahrenheit  Foul-smelling vaginal discharge  Difficulty urinating  Nausea and vomiting  Increased pain at the site of the incision that is unrelieved with pain medicine.  Difficulty breathing with or without chest pain  New calf pain especially if only on one side  Sudden, continuing increased vaginal bleeding with or without clots.   Follow-up: 1. See Sheri Hensley in 3 weeks. This appointment will be made for you.  Contacts: For questions or concerns you should contact:  Dr. Everitt Hensley at [phone]  or at [place]   Recommended Gastroenterologists: Dr Watt Climes, Dr Collene Mares.

## 2016-01-30 ENCOUNTER — Telehealth: Payer: Self-pay | Admitting: Gynecologic Oncology

## 2016-01-30 ENCOUNTER — Other Ambulatory Visit: Payer: Self-pay | Admitting: Gynecologic Oncology

## 2016-01-30 DIAGNOSIS — C541 Malignant neoplasm of endometrium: Secondary | ICD-10-CM

## 2016-01-30 NOTE — Telephone Encounter (Signed)
Informed patient of results of stage IB grade 2. Lymphadenectomy not performed.  High risk factors for recurrence.  Recommend adjuvant radiation to reduce the risk for recurrence. Given the lack of lymphadenectomy I recommend whole pelvic in addition to vaginal brachy (higher risk for occult lymph node involvement). She will meet with radiation oncology to discuss this treatment further and make a determination as to whether it is appropriate or desired by her.  Symptoms of TIA yesterday - came and went.  Recommended restarting Plavix in addition to aspirin 81.  [name], MD

## 2016-02-02 ENCOUNTER — Encounter: Payer: Self-pay | Admitting: Radiation Oncology

## 2016-02-03 ENCOUNTER — Telehealth: Payer: Self-pay | Admitting: Nurse Practitioner

## 2016-02-03 NOTE — Telephone Encounter (Signed)
Patient is scheduled to come in to see Hoyle Sauer on 04/12/16 hospital stay in December with TIA.

## 2016-02-03 NOTE — Telephone Encounter (Signed)
I discussed with daughter over the phone. Pt stopped plavix 10 days before surgery and was kept on ASA 81mg . Hysterectomy went well. She was continued on ASA 81mg  post surgery. Pt had TIA symptoms on 01/29/16 3rd day after procedure. The symptoms are similar to her previous TIA. Pt daughter called OGBYN and she was put back on plavix in addition to ASA.   According to previous plan in 12/2015 when she was in hospital, she needs to contact her cardiology to consider DOACs for afib stroke prevention. Her cardiology did not put her on [facility] due to frequent falls and unsteady gait. However, as per daughter and granddaughter, she only had one mechanical fall last year. She is not a fall risk. Therefore, I recommended that pt should have cardiology visit soon after the procedure to decide on the anticoagulation.   I told daughter that pt needs sooner appointment with her cardiologist Dr. Geraldo Pitter (currently 04/27/16) to discuss about anticoagulation. We can see her in March with Hoyle Sauer but currently what she needs is to see Dr. Geraldo Pitter. Daughter understood and she will call Dr. Geraldo Pitter today.   Rosalin Hawking, MD PhD Stroke Neurology 02/03/2016 12:40 PM

## 2016-02-13 ENCOUNTER — Encounter: Payer: Self-pay | Admitting: Radiation Oncology

## 2016-02-13 NOTE — Progress Notes (Signed)
GYN Location of Tumor / Histology: endometrioid endometrial cancer   Sheri Hensley presented with symptoms of: postmenopausal bleeding since early September 2017  Biopsies revealed:   01/27/16 Diagnosis Uterus +/- tubes/ovaries, neoplastic - INVASIVE FIGO GRADE 2 ENDOMETRIAL ADENOCARCINOMA, ENDOMETRIOID TYPE. - TUMOR SPANS 5.1 CM IN GREATEST DIMENSION. - TUMOR INVADES THROUGH THE UNDERLYING MYOMETRIUM AND IS ADJACENT (LESS THAN 0.1 CM) TO THE SEROSAL SURFACE. - TUMOR IS CONFINED TO THE MYOMETRIUM WITHOUT INVOLVEMENT OF THE CERVICAL STROMA. - LYMPH/VASCULAR INVASION IS IDENTIFIED. - SEE ONCOLOGY TEMPLATE. ADDITIONAL FINDINGS: - MYOMETRIUM DEMONSTRATES ADENOMYOSIS AND LEIOMYOMA FORMATION INCLUDING A CALCIFIED LEIOMYOMA. - BENIGN BILATERAL FALLOPIAN TUBES WITH BENIGN PARATUBAL CYST FORMATION. - BENIGN BILATERAL OVARIES.  11/20/15 Diagnosis Endometrium, curettage - ENDOMETRIOID ADENOCARCINOMA, FIGO GRADE 2.  Past/Anticipated interventions by Gyn/Onc surgery, if any: 01/27/16 - Procedure: XI ROBOTIC ASSISTED TOTAL HYSTERECTOMY WITH BILATERAL SALPINGO OOPHORECTOMY;  Surgeon: Everitt Amber, MD  Past/Anticipated interventions by medical oncology, if any: no  Weight changes, if any: has lost 10 lbs. Reports having a poor appetite.  Bowel/Bladder complaints, if any: reports having some constipation. She is taking colace.  She reports having some burning with urination.  She said it feels "irritated."  Nausea/Vomiting, if any: no  Pain issues, if any:  Yes - reports having daily headaches and also chronic knee pains.  SAFETY ISSUES:  Prior radiation? no  Pacemaker/ICD? no  Possible current pregnancy? no  Is the patient on methotrexate? no  Current Complaints / other details:  Dr. Denman George is recommending whole pelvic in addition to vaginal brachytherapy.  Patient reports feeling weak today.  Her heart was 45 on the monitor and 44 when taken manually.  She is taking metoprolol.  She  reports having trouble with pelvic exams and is worried about having brachytherapy.  She is here with her daughter.  BP (!) 114/101 (BP Location: Right Arm, Patient Position: Sitting)   Pulse (!) 45   Temp 98.5 F (36.9 C) (Oral)   Ht 5\' 4"  (1.626 m)   Wt 126 lb 12.8 oz (57.5 kg)   SpO2 100%   BMI 21.77 kg/m    Wt Readings from Last 3 Encounters:  02/18/16 126 lb 12.8 oz (57.5 kg)  01/21/16 132 lb (59.9 kg)  12/31/15 130 lb 11.2 oz (59.3 kg)

## 2016-02-18 ENCOUNTER — Ambulatory Visit
Admission: RE | Admit: 2016-02-18 | Discharge: 2016-02-18 | Disposition: A | Discharge status: Home / Self Care | Payer: Medicare HMO | Source: Ambulatory Visit | Attending: Radiation Oncology | Admitting: Radiation Oncology

## 2016-02-18 ENCOUNTER — Encounter: Payer: Self-pay | Admitting: Radiation Oncology

## 2016-02-18 VITALS — BP 176/60 | HR 45 | Temp 98.5°F | Ht 64.0 in | Wt 126.8 lb

## 2016-02-18 DIAGNOSIS — H3530 Unspecified macular degeneration: Secondary | ICD-10-CM | POA: Diagnosis not present

## 2016-02-18 DIAGNOSIS — Z9841 Cataract extraction status, right eye: Secondary | ICD-10-CM | POA: Insufficient documentation

## 2016-02-18 DIAGNOSIS — C541 Malignant neoplasm of endometrium: Secondary | ICD-10-CM

## 2016-02-18 DIAGNOSIS — Z8619 Personal history of other infectious and parasitic diseases: Secondary | ICD-10-CM | POA: Insufficient documentation

## 2016-02-18 DIAGNOSIS — Z90722 Acquired absence of ovaries, bilateral: Secondary | ICD-10-CM | POA: Diagnosis not present

## 2016-02-18 DIAGNOSIS — Z9012 Acquired absence of left breast and nipple: Secondary | ICD-10-CM | POA: Diagnosis not present

## 2016-02-18 DIAGNOSIS — I10 Essential (primary) hypertension: Secondary | ICD-10-CM | POA: Insufficient documentation

## 2016-02-18 DIAGNOSIS — I4891 Unspecified atrial fibrillation: Secondary | ICD-10-CM | POA: Insufficient documentation

## 2016-02-18 DIAGNOSIS — R531 Weakness: Secondary | ICD-10-CM | POA: Diagnosis not present

## 2016-02-18 DIAGNOSIS — Z888 Allergy status to other drugs, medicaments and biological substances status: Secondary | ICD-10-CM | POA: Diagnosis not present

## 2016-02-18 DIAGNOSIS — E785 Hyperlipidemia, unspecified: Secondary | ICD-10-CM | POA: Diagnosis not present

## 2016-02-18 DIAGNOSIS — Z800 Family history of malignant neoplasm of digestive organs: Secondary | ICD-10-CM | POA: Insufficient documentation

## 2016-02-18 DIAGNOSIS — Z8673 Personal history of transient ischemic attack (TIA), and cerebral infarction without residual deficits: Secondary | ICD-10-CM | POA: Diagnosis not present

## 2016-02-18 DIAGNOSIS — Z8249 Family history of ischemic heart disease and other diseases of the circulatory system: Secondary | ICD-10-CM | POA: Insufficient documentation

## 2016-02-18 DIAGNOSIS — Z90710 Acquired absence of both cervix and uterus: Secondary | ICD-10-CM | POA: Insufficient documentation

## 2016-02-18 DIAGNOSIS — Z881 Allergy status to other antibiotic agents status: Secondary | ICD-10-CM | POA: Diagnosis not present

## 2016-02-18 DIAGNOSIS — M810 Age-related osteoporosis without current pathological fracture: Secondary | ICD-10-CM | POA: Diagnosis not present

## 2016-02-18 DIAGNOSIS — Z7902 Long term (current) use of antithrombotics/antiplatelets: Secondary | ICD-10-CM | POA: Insufficient documentation

## 2016-02-18 DIAGNOSIS — Z853 Personal history of malignant neoplasm of breast: Secondary | ICD-10-CM | POA: Diagnosis not present

## 2016-02-18 DIAGNOSIS — Z809 Family history of malignant neoplasm, unspecified: Secondary | ICD-10-CM | POA: Diagnosis not present

## 2016-02-18 DIAGNOSIS — Z7982 Long term (current) use of aspirin: Secondary | ICD-10-CM | POA: Insufficient documentation

## 2016-02-18 DIAGNOSIS — R001 Bradycardia, unspecified: Secondary | ICD-10-CM | POA: Diagnosis not present

## 2016-02-18 DIAGNOSIS — I2510 Atherosclerotic heart disease of native coronary artery without angina pectoris: Secondary | ICD-10-CM | POA: Insufficient documentation

## 2016-02-18 DIAGNOSIS — H409 Unspecified glaucoma: Secondary | ICD-10-CM | POA: Diagnosis not present

## 2016-02-18 DIAGNOSIS — R42 Dizziness and giddiness: Secondary | ICD-10-CM | POA: Diagnosis not present

## 2016-02-18 DIAGNOSIS — Z9889 Other specified postprocedural states: Secondary | ICD-10-CM | POA: Insufficient documentation

## 2016-02-18 DIAGNOSIS — Z9842 Cataract extraction status, left eye: Secondary | ICD-10-CM | POA: Insufficient documentation

## 2016-02-18 DIAGNOSIS — R51 Headache: Secondary | ICD-10-CM | POA: Diagnosis not present

## 2016-02-18 HISTORY — DX: Unspecified atrial fibrillation: I48.91

## 2016-02-18 NOTE — Progress Notes (Signed)
Please see the Nurse Progress Note in the MD Initial Consult Encounter for this patient. 

## 2016-02-18 NOTE — Progress Notes (Signed)
Radiation Oncology         [phone] ________________________________  Initial Outpatient Consultation  Name: Sheri Hensley MRN: [id number]  Date: 02/18/2016  DOB: [birth year]  [id number] Angelique Blonder., MD  Everitt Amber, MD   REFERRING PHYSICIAN: Everitt Amber, MD  DIAGNOSIS: The encounter diagnosis was Endometrial cancer [facility]). Grade 2 endometrioid endometrial cancer   HISTORY OF PRESENT ILLNESS::Sheri Hensley is a 83 y.o. female who presented with post menopausal bleeding since early September 2017. This initially presented as light spotting but grew more persistent with time. She was seen by Dr. Berenice Primas on 10/20/15. A TVUS was performed, this showed a 6.25 x 5.2 x 4.19 cm uterus with a thickened endometrium of 2 cm. A biopsy was not performed due to a narrowed vagina. Patient underwent a D&C and pathology on 11/20/15 revealing endometrioid adenocarcinoma, FIGO grade 2. She then underwent an xi robotic assisted total hysterectomy with bilateral salpingo oophorectomy on 01/27/16. This showed endometrioid endometrial adenocarcinoma spanning 5.1 cm in greatest dimension. The tumor invaded the underlying myometrium and is adjacent to the serosal surface, with no involvement of the cervical stroma. Lymphovascular invasion was identified. Node dissection was not performed in light of the patient's age and health issues.  Patient notes a 10 pound weight loss and notes a poor appetite. She reports constipation for which she is taking Colace. She reports occ. burning with urination and irritation. She complains of daily headaches and chronic knee pain. She denies nausea/vomiting. Patient has a low heart rate and reports feeling weak and dizzy. Of note, patient reports trouble with pelvic exams and is worried about brachytherapy.  PREVIOUS RADIATION THERAPY: No  PAST MEDICAL HISTORY:  has a past medical history of Atrial fibrillation ([place]); Bruises easily; Cancer ([place]) (1998); Coronary  artery disease; Endometrial cancer ([place]); Endometrial cancer ([place]); GERD (gastroesophageal reflux disease); Glaucoma, both eyes; History of breast cancer; History of Clostridium difficile infection (5-6 yrs ago); History of TIAs (12/22/2015); Hyperlipidemia; Hypertension; Macular degeneration; Narrow complex tachycardia ([facility]); OA (osteoarthritis); PMB (postmenopausal bleeding); PONV (postoperative nausea and vomiting); Thickened endometrium; and Wears glasses.    PAST SURGICAL HISTORY: Past Surgical History:  Procedure Laterality Date  . APPENDECTOMY  1954  . CATARACT EXTRACTION W/ INTRAOCULAR LENS  IMPLANT, BILATERAL  1994  . colonscopy    . DILATION AND CURETTAGE OF UTERUS N/A 11/20/2015   Procedure: DILATATION AND CURETTAGE;  Surgeon: Everitt Amber, MD;  Location: [facility];  Service: Gynecology;  Laterality: N/A;  . DILATION AND CURETTAGE, DIAGNOSTIC / THERAPEUTIC N/A novmber 2017   per daughter with biopsy as stated  . KNEE ARTHROSCOPY Right 2002  . MASTECTOMY Left 1998   w/ lympn node dissection's  . ROBOTIC ASSISTED TOTAL HYSTERECTOMY WITH BILATERAL SALPINGO OOPHERECTOMY Bilateral 01/27/2016   Procedure: XI ROBOTIC ASSISTED TOTAL HYSTERECTOMY WITH BILATERAL SALPINGO OOPHORECTOMY;  Surgeon: Everitt Amber, MD;  Location: [facility];  Service: Gynecology;  Laterality: Bilateral;    FAMILY HISTORY: family history includes Colon cancer in her brother; Hypertension in her father and mother; Liver cancer in her brother.  SOCIAL HISTORY:  reports that she has never smoked. She has never used smokeless tobacco. She reports that she does not drink alcohol or use drugs.  ALLERGIES: Ativan [lorazepam] and Flagyl [metronidazole]  MEDICATIONS:  Current Outpatient Prescriptions  Medication Sig Dispense Refill  . acetaminophen (TYLENOL) 500 MG tablet Take 500 mg by mouth every 6 (six) hours as needed for moderate pain or headache.    . ALPRAZolam (XANAX) 0.25  MG tablet Take 0.125-0.25 mg by  mouth daily as needed for anxiety.     Marland Kitchen amiodarone (PACERONE) 200 MG tablet Take 100 mg by mouth every morning.   3  . aspirin EC 81 MG EC tablet Take 1 tablet (81 mg total) by mouth daily. 30 tablet 11  . atorvastatin (LIPITOR) 10 MG tablet Take 10 mg by mouth at bedtime.    . betamethasone acetate-betamethasone sodium phosphate (CELESTONE) 6 (3-3) MG/ML injection Inject 12 mg into the articular space every 3 (three) months. Next injection due end of January    . bimatoprost (LUMIGAN) 0.01 % SOLN Place 1 drop into both eyes at bedtime.    . brimonidine (ALPHAGAN P) 0.1 % SOLN Place 1 drop into both eyes every 8 (eight) hours.     . clopidogrel (PLAVIX) 75 MG tablet Take 1 tablet (75 mg total) by mouth daily. (Patient taking differently: Take 75 mg by mouth daily. Will stop prior to surgery on 01-17-16 and will start 81 mg Aspirin) 30 tablet 0  . cyanocobalamin (V-R VITAMIN B-12) 500 MCG tablet Take 500 mcg by mouth daily.     Marland Kitchen docusate sodium (COLACE) 100 MG capsule Take 100 mg by mouth 2 (two) times daily.    Marland Kitchen ibuprofen (ADVIL,MOTRIN) 600 MG tablet Take 1 tablet (600 mg total) by mouth every 6 (six) hours as needed (mild pain). 30 tablet 0  . lisinopril (PRINIVIL,ZESTRIL) 10 MG tablet Take 10 mg by mouth every morning.     . loratadine (CLARITIN) 10 MG tablet Take 10 mg by mouth daily as needed for allergies.    . Melatonin 10 MG CAPS Take 10 mg by mouth at bedtime.     . metoprolol tartrate (LOPRESSOR) 25 MG tablet Take 25 mg by mouth 2 (two) times daily.     . Multiple Vitamins-Minerals (CENTRUM VITAMINTS PO) Take 1 tablet by mouth every morning.    . Multiple Vitamins-Minerals (ICAPS AREDS 2) CAPS Take 1 tablet by mouth 2 (two) times daily.     Marland Kitchen omeprazole (PRILOSEC) 20 MG capsule Take 20 mg by mouth every morning.  1  . sertraline (ZOLOFT) 50 MG tablet Take 50 mg by mouth daily.    Marland Kitchen senna (SENOKOT) 8.6 MG TABS tablet Take 1 tablet (8.6 mg total) by mouth at bedtime. (Patient not taking:  Reported on 02/18/2016) 120 each 0  . traMADol (ULTRAM) 50 MG tablet Take 1 tablet (50 mg total) by mouth every 6 (six) hours as needed for severe pain. (Patient not taking: Reported on 02/18/2016) 30 tablet 0   No current facility-administered medications for this encounter.     REVIEW OF SYSTEMS:  A 12 point review of systems is documented in the electronic medical record. This was obtained by the nursing staff. However, I reviewed this with the patient to discuss relevant findings and make appropriate changes.     PHYSICAL EXAM:  height is 5\' 4"  (1.626 m) and weight is 126 lb 12.8 oz (57.5 kg). Her oral temperature is 98.5 F (36.9 C). Her blood pressure is 114/101 (abnormal) and her pulse is 45 (abnormal). Her oxygen saturation is 100%.    General: Alert and oriented, in no acute distress HEENT: Head is normocephalic. Extraocular movements are intact. Oropharynx is clear. Neck: Neck is supple, no palpable cervical or supraclavicular lymphadenopathy. Heart: Regular rhythm ,decreased rate with no murmurs, rubs, or gallops. Chest: Clear to auscultation bilaterally, with no rhonchi, wheezes, or rales. Abdomen: Soft, nontender, nondistended, with no  rigidity or guarding. Four small scars from the laparoscopic procedure that are healing well without signs of drainage or infection. Extremities: No cyanosis or edema. Lymphatics: see Neck Exam Skin: No concerning lesions. Musculoskeletal: symmetric strength and muscle tone throughout. Neurologic: Cranial nerves II through XII are grossly intact. No obvious focalities. Speech is fluent. Coordination is intact. Psychiatric: Judgment and insight are intact. Affect is appropriate. Pelvic exam deferred until simulation and planning day  ECOG = 1   LABORATORY DATA:  Lab Results  Component Value Date   WBC 14.5 (H) 01/28/2016   HGB 10.8 (L) 01/28/2016   HCT 32.4 (L) 01/28/2016   MCV 89.5 01/28/2016   PLT 155 01/28/2016   NEUTROABS 5.3 01/21/2016    Lab Results  Component Value Date   NA 137 01/28/2016   K 4.3 01/28/2016   CL 105 01/28/2016   CO2 25 01/28/2016   GLUCOSE 116 (H) 01/28/2016   CREATININE 0.79 01/28/2016   CALCIUM 8.8 (L) 01/28/2016      RADIOGRAPHY: No results found.    IMPRESSION: Grade 2 endometrioid endometrial cancer. Given the patient's age and risk for bleeding she did not proceed with lymph node assessment at the time of her surgery. The patient was found to have a deeply invasive tumor, almost to the serosa. Her pathology also showed LVI. Given these findings, I would agree with Dr. Serita Grit recommendation for external beam and intracavitary treatments. We discussed several treatment options including no further therapy due to her advanced age, external beam radiation in [place], just brachytherapy, or if the patient wishes to be very aggressive, we could proceed with a combination of external beam and brachytherapy. I discussed the side effects and potential toxicities of these treatment options with the patient and her daughter.  PLAN: The patient will meet with Dr. Denman George and her cardiologist in the next several days and make a decision whether she wishes to proceed with radiation therapy or not as a part of her adjuvant treatment. I asked the daughter to call me once she makes a decision. We have called the patient's cardiologist office concerning the patient's generalized weakness and low heart rate to see if her appointment can be moved up concerning this issue  I spent 60 minutes minutes face to face with the patient and more than 50% of that time was spent in counseling and/or coordination of care.   ------------------------------------------------  Blair Promise, PhD, MD  This document serves as a record of services personally performed by Gery Pray, MD. It was created on his behalf by Bethann Humble, a trained medical scribe. The creation of this record is based on the scribe's personal observations  and the provider's statements to them. This document has been checked and approved by the attending provider.

## 2016-02-23 ENCOUNTER — Ambulatory Visit: Payer: Medicare HMO | Attending: Gynecologic Oncology | Admitting: Gynecologic Oncology

## 2016-02-23 ENCOUNTER — Encounter: Payer: Self-pay | Admitting: Gynecologic Oncology

## 2016-02-23 VITALS — BP 190/73 | HR 57 | Temp 98.3°F | Resp 18 | Ht 64.0 in | Wt 128.6 lb

## 2016-02-23 DIAGNOSIS — R001 Bradycardia, unspecified: Secondary | ICD-10-CM | POA: Diagnosis not present

## 2016-02-23 DIAGNOSIS — Z9889 Other specified postprocedural states: Secondary | ICD-10-CM | POA: Insufficient documentation

## 2016-02-23 DIAGNOSIS — Z79899 Other long term (current) drug therapy: Secondary | ICD-10-CM | POA: Insufficient documentation

## 2016-02-23 DIAGNOSIS — Z7189 Other specified counseling: Secondary | ICD-10-CM

## 2016-02-23 DIAGNOSIS — C541 Malignant neoplasm of endometrium: Secondary | ICD-10-CM | POA: Diagnosis not present

## 2016-02-23 DIAGNOSIS — E785 Hyperlipidemia, unspecified: Secondary | ICD-10-CM | POA: Diagnosis not present

## 2016-02-23 DIAGNOSIS — I471 Supraventricular tachycardia: Secondary | ICD-10-CM | POA: Diagnosis not present

## 2016-02-23 DIAGNOSIS — I10 Essential (primary) hypertension: Secondary | ICD-10-CM | POA: Diagnosis not present

## 2016-02-23 DIAGNOSIS — Z7901 Long term (current) use of anticoagulants: Secondary | ICD-10-CM | POA: Diagnosis not present

## 2016-02-23 DIAGNOSIS — Z7982 Long term (current) use of aspirin: Secondary | ICD-10-CM | POA: Diagnosis not present

## 2016-02-23 DIAGNOSIS — I2510 Atherosclerotic heart disease of native coronary artery without angina pectoris: Secondary | ICD-10-CM | POA: Diagnosis not present

## 2016-02-23 NOTE — Progress Notes (Signed)
Follow-up Note: Gyn-Onc  Consult was initially requested by Dr. Berenice Primas for the evaluation of Sheri Hensley 83 y.o. female  CC:  Chief Complaint  Patient presents with  . Endometrial Cancer    Assessment/Plan:  Sheri Hensley  is a 83 y.o.  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 S/p endometrial cancer staging on 01/27/16 - healing well.  High/intermediate risk factors for recurrence. Recommendation is for external beam radiation and vaginal brachytherapy to reduce risk for local recurrence in accordance with NCCN guidelines.  I discussed this with the patient. I discussed the role of adjuvant therapy. I discussed prognosis and risk for recurrence.  After discussion with the patient we are favoring vaginal brachytherapy alone as it is less likely to incur toxicity and will still have good impact with reduction in recurrence risk.  I believe that her vaginal caliber is such that she could accommodate a vaginal cylinder.  We reviewed symptoms concerning for recurrence and she will see me if these develop prior to her scheduled appointment.  After completing adjuvant therapy I recommend she follow-up at 3 monthly intervals for symptom review, physical examination and pelvic examination. Pap smear is not recommended in routine endometrial cancer surveillance. After 2 years we will space these visits to every 6 months, and then annually if recurrence has not developed within 5 years. All questions were answered.  HPI: Sheri Hensley is a 83 year old woman (G1P1) who is seen in consultation at the request of Dr Berenice Primas due to post-menopausal bleeding.  The patient has a history of postmenopausal bleeding since early September 2017. It was initially light spotting, then became slightly more persistent over time.  She was seen by Dr Berenice Primas on 10/20/15 where a TVUS was performed and demonstrated a uterus measuring 6.25x5.2x4.19cm but  with a thickened endometrium of 2cm. The notes document that an office biopsy was not possible due to narrowed vagina, an Dr Berenice Primas did not think she would be able to accomplish a D&C in the operating room.  The patient is relatively healthy for her advanced age. She has a history of an arrythmia for which she takes amiodarone. She also takes Plavix for a presumed TIA. She otherwise has a medical history significant for a prior appendectomy and 1 prior vaginal delivery.   Interval Hx:  On 11/20/15 she underwent D&C and pathology revealed grade 2 endomettrioid endometrial adenocarcinoma.  On the morning of her planned surgical date on 12/23/15 she was diagnosed with a TIA.  She has resolved all symptoms (right hand weakness). She was restarted on Plavix and baby ASA.  Surgery was rescheduled for 01/27/16 and a robotic assisted total hysterectomy and BSO was performed. Upper vaginal laceration was incurred due to size of specimen and vaginal retrieval.   Final pathology revealed a 5.1cm grade 2 tumor with deep (1.8 of 1.8cm myometrial invasion with LVSI identified). No cervical or ovarian involvement was appreciated. In accordance with NCCN guidelines adjuvant radiation was recommended.  The patient is doing well postop though she did have an episode of bradycardia 2 weeks ago.   Current Meds:  Outpatient Encounter Prescriptions as of 02/23/2016  Medication Sig  . acetaminophen (TYLENOL) 500 MG tablet Take 500 mg by mouth every 6 (six) hours as needed for moderate pain or headache.  . ALPRAZolam (XANAX) 0.25 MG tablet Take 0.125-0.25 mg by mouth daily as needed for anxiety.   Marland Kitchen amiodarone (PACERONE) 200 MG tablet Take 100 mg by mouth every  morning.   Marland Kitchen aspirin EC 81 MG EC tablet Take 1 tablet (81 mg total) by mouth daily.  Marland Kitchen atorvastatin (LIPITOR) 10 MG tablet Take 10 mg by mouth at bedtime.  . betamethasone acetate-betamethasone sodium phosphate (CELESTONE) 6 (3-3) MG/ML injection Inject 12 mg  into the articular space every 3 (three) months. Next injection due end of January  . bimatoprost (LUMIGAN) 0.01 % SOLN Place 1 drop into both eyes at bedtime.  . brimonidine (ALPHAGAN P) 0.1 % SOLN Place 1 drop into both eyes every 8 (eight) hours.   . clopidogrel (PLAVIX) 75 MG tablet Take 1 tablet (75 mg total) by mouth daily. (Patient taking differently: Take 75 mg by mouth daily. Will stop prior to surgery on 01-17-16 and will start 81 mg Aspirin)  . cyanocobalamin (V-R VITAMIN B-12) 500 MCG tablet Take 500 mcg by mouth daily.   Marland Kitchen docusate sodium (COLACE) 100 MG capsule Take 100 mg by mouth 2 (two) times daily.  Marland Kitchen ibuprofen (ADVIL,MOTRIN) 600 MG tablet Take 1 tablet (600 mg total) by mouth every 6 (six) hours as needed (mild pain).  Marland Kitchen lisinopril (PRINIVIL,ZESTRIL) 10 MG tablet Take 10 mg by mouth every morning.   . loratadine (CLARITIN) 10 MG tablet Take 10 mg by mouth daily as needed for allergies.  . Melatonin 10 MG CAPS Take 10 mg by mouth at bedtime.   . metoprolol tartrate (LOPRESSOR) 25 MG tablet Take 25 mg by mouth 2 (two) times daily.   . Multiple Vitamins-Minerals (CENTRUM VITAMINTS PO) Take 1 tablet by mouth every morning.  . Multiple Vitamins-Minerals (ICAPS AREDS 2) CAPS Take 1 tablet by mouth 2 (two) times daily.   Marland Kitchen omeprazole (PRILOSEC) 20 MG capsule Take 20 mg by mouth every morning.  . senna (SENOKOT) 8.6 MG TABS tablet Take 1 tablet (8.6 mg total) by mouth at bedtime. (Patient not taking: Reported on 02/18/2016)  . sertraline (ZOLOFT) 50 MG tablet Take 50 mg by mouth daily.  . traMADol (ULTRAM) 50 MG tablet Take 1 tablet (50 mg total) by mouth every 6 (six) hours as needed for severe pain. (Patient not taking: Reported on 02/18/2016)   No facility-administered encounter medications on file as of 02/23/2016.     Allergy:  Allergies  Allergen Reactions  . Ativan [Lorazepam]     "HALLUCINATIONS"  . Flagyl [Metronidazole] Rash    Social Hx:   Social History   Social  History  . Marital status: Widowed    Spouse name: N/A  . Number of children: 1  . Years of education: N/A   Occupational History  . Not on file.   Social History Main Topics  . Smoking status: Never Smoker  . Smokeless tobacco: Never Used  . Alcohol use No     Comment: occasional  . Drug use: No  . Sexual activity: No   Other Topics Concern  . Not on file   Social History Narrative  . No narrative on file    Past Surgical Hx:  Past Surgical History:  Procedure Laterality Date  . APPENDECTOMY  1954  . CATARACT EXTRACTION W/ INTRAOCULAR LENS  IMPLANT, BILATERAL  1994  . colonscopy    . DILATION AND CURETTAGE OF UTERUS N/A 11/20/2015   Procedure: DILATATION AND CURETTAGE;  Surgeon: Everitt Amber, MD;  Location: [facility];  Service: Gynecology;  Laterality: N/A;  . DILATION AND CURETTAGE, DIAGNOSTIC / THERAPEUTIC N/A novmber 2017   per daughter with biopsy as stated  . KNEE ARTHROSCOPY Right  2002  . MASTECTOMY Left 1998   w/ lympn node dissection's  . ROBOTIC ASSISTED TOTAL HYSTERECTOMY WITH BILATERAL SALPINGO OOPHERECTOMY Bilateral 01/27/2016   Procedure: XI ROBOTIC ASSISTED TOTAL HYSTERECTOMY WITH BILATERAL SALPINGO OOPHORECTOMY;  Surgeon: Everitt Amber, MD;  Location: [facility];  Service: Gynecology;  Laterality: Bilateral;    Past Medical Hx:  Past Medical History:  Diagnosis Date  . Atrial fibrillation ([place])   . Bruises easily   . Cancer ([place]) 1998   left tamoxifem x 5 yrs  . Coronary artery disease    CARDIOLOGIST-  DR Geraldo Pitter ([facility])  . Endometrial cancer ([place])   . Endometrial cancer ([place])   . GERD (gastroesophageal reflux disease)   . Glaucoma, both eyes   . History of breast cancer    1998--  s/p  left mastectomy with sln dissection's  ,  no chemo or radiation-  no recurrence  . History of Clostridium difficile infection 5-6 yrs ago  . History of TIAs 12/22/2015   yrs ago ? AND QUESTIONABLE ONE YRS AGO  . Hyperlipidemia    . Hypertension   . Macular degeneration   . Narrow complex tachycardia [facility])    cardiologist-  dr Salley Scarlet  . OA (osteoarthritis)    right knee  . PMB (postmenopausal bleeding)   . PONV (postoperative nausea and vomiting)    ponv after appendectomy 1954, DID WELL AFTER D AND C RECENT  . Thickened endometrium   . Wears glasses     Past Gynecological History:  G1 (SVD) No LMP recorded. Patient is postmenopausal.  Family Hx:  Family History  Problem Relation Age of Onset  . Hypertension Mother   . Hypertension Father   . Liver cancer Brother   . Colon cancer Brother     Review of Systems:  Constitutional  Feels well,    ENT Normal appearing ears and nares bilaterally Skin/Breast  No rash, sores, jaundice, itching, dryness Cardiovascular  No chest pain, shortness of breath, or edema  Pulmonary  No cough or wheeze.  Gastro Intestinal  No nausea, vomitting, or diarrhoea. No bright red blood per rectum, no abdominal pain, change in bowel movement, or constipation.  Genito Urinary  No frequency, urgency, dysuria, + postmenopausal bleeding Musculo Skeletal  No myalgia, arthralgia, joint swelling or pain  Neurologic  No weakness, numbness, change in gait,  Psychology  No depression, anxiety, insomnia.   Vitals:  Blood pressure (!) 190/73, pulse (!) 57, temperature 98.3 F (36.8 C), temperature source Oral, resp. rate 18, height 5\' 4"  (1.626 m), weight 128 lb 9.6 oz (58.3 kg), SpO2 (!) 10 %.  Physical Exam: WD in NAD Neck  Supple NROM, without any enlargements.  Lymph Node Survey No cervical supraclavicular or inguinal adenopathy Cardiovascular  Pulse normal rate, regularity and rhythm. S1 and S2 normal.  Lungs  Clear to auscultation bilateraly, without wheezes/crackles/rhonchi. Good air movement.  Skin  No rash/lesions/breakdown  Psychiatry  Alert and oriented to person, place, and time  Abdomen  Normoactive bowel sounds, abdomen soft, non-tender and thin  without evidence of hernia.  Back No CVA tenderness Genito Urinary  Vulva/vagina: Normal external female genitalia.  No lesions. No discharge or bleeding.  Bladder/urethra:  No lesions or masses, well supported bladder  Vagina: narrow but permits pederson speculum and a normal appearing cervix is seen clearly. The vaginal walls are smooth and atrophic and free of lesions.  Cervix: Normal appearing, no lesions. Stenotic - unsuccessful attempt at office biopsy  Uterus: Small, mobile,  no parametrial involvement or nodularity.  Adnexa: no palpable masses. Rectal  deferred Extremities  No bilateral cyanosis, clubbing or edema.   30 minutes of direct face to face counseling time was spent with the patient. This included discussion about prognosis, therapy recommendations and postoperative side effects and are beyond the scope of routine postoperative care.  [name], MD  02/23/2016, 3:38 PM

## 2016-02-23 NOTE — Patient Instructions (Addendum)
Take Miralax (polyethelene glycol) 1 tablespoon dissolved in a beverage of your choice every morning. Continue to take colace. If you choose to have radiation, then follow up with Dr Denman George after radiation treatment is completed. If you choose to not have radiation, then follow up with Dr Denman George in June 2018.

## 2016-02-24 DIAGNOSIS — H8110 Benign paroxysmal vertigo, unspecified ear: Secondary | ICD-10-CM | POA: Diagnosis not present

## 2016-02-26 DIAGNOSIS — R269 Unspecified abnormalities of gait and mobility: Secondary | ICD-10-CM | POA: Diagnosis not present

## 2016-03-23 DIAGNOSIS — H524 Presbyopia: Secondary | ICD-10-CM | POA: Diagnosis not present

## 2016-03-23 DIAGNOSIS — H26491 Other secondary cataract, right eye: Secondary | ICD-10-CM | POA: Diagnosis not present

## 2016-03-23 DIAGNOSIS — H401131 Primary open-angle glaucoma, bilateral, mild stage: Secondary | ICD-10-CM | POA: Diagnosis not present

## 2016-03-23 DIAGNOSIS — H5213 Myopia, bilateral: Secondary | ICD-10-CM | POA: Diagnosis not present

## 2016-04-12 ENCOUNTER — Ambulatory Visit: Payer: Self-pay | Admitting: Nurse Practitioner

## 2016-04-29 DIAGNOSIS — M1731 Unilateral post-traumatic osteoarthritis, right knee: Secondary | ICD-10-CM | POA: Diagnosis not present

## 2016-05-24 ENCOUNTER — Telehealth: Payer: Self-pay | Admitting: *Deleted

## 2016-05-24 ENCOUNTER — Ambulatory Visit
Admission: RE | Admit: 2016-05-24 | Discharge: 2016-05-24 | Disposition: A | Discharge status: Home / Self Care | Payer: Medicare HMO | Source: Ambulatory Visit | Attending: Radiation Oncology | Admitting: Radiation Oncology

## 2016-05-24 ENCOUNTER — Ambulatory Visit (HOSPITAL_BASED_OUTPATIENT_CLINIC_OR_DEPARTMENT_OTHER): Payer: Medicare HMO

## 2016-05-24 ENCOUNTER — Encounter: Payer: Self-pay | Admitting: Radiation Oncology

## 2016-05-24 ENCOUNTER — Ambulatory Visit: Payer: Medicare HMO | Attending: Gynecologic Oncology | Admitting: Gynecologic Oncology

## 2016-05-24 ENCOUNTER — Encounter: Payer: Self-pay | Admitting: Gynecologic Oncology

## 2016-05-24 VITALS — BP 183/61 | HR 61 | Temp 98.2°F | Ht 64.0 in | Wt 130.2 lb

## 2016-05-24 VITALS — BP 197/67 | HR 60 | Temp 98.2°F | Resp 18 | Wt 131.5 lb

## 2016-05-24 DIAGNOSIS — H409 Unspecified glaucoma: Secondary | ICD-10-CM | POA: Insufficient documentation

## 2016-05-24 DIAGNOSIS — I2510 Atherosclerotic heart disease of native coronary artery without angina pectoris: Secondary | ICD-10-CM | POA: Insufficient documentation

## 2016-05-24 DIAGNOSIS — K219 Gastro-esophageal reflux disease without esophagitis: Secondary | ICD-10-CM | POA: Insufficient documentation

## 2016-05-24 DIAGNOSIS — E785 Hyperlipidemia, unspecified: Secondary | ICD-10-CM | POA: Insufficient documentation

## 2016-05-24 DIAGNOSIS — Z881 Allergy status to other antibiotic agents status: Secondary | ICD-10-CM | POA: Diagnosis not present

## 2016-05-24 DIAGNOSIS — Z7902 Long term (current) use of antithrombotics/antiplatelets: Secondary | ICD-10-CM | POA: Diagnosis not present

## 2016-05-24 DIAGNOSIS — Z7982 Long term (current) use of aspirin: Secondary | ICD-10-CM | POA: Insufficient documentation

## 2016-05-24 DIAGNOSIS — Z800 Family history of malignant neoplasm of digestive organs: Secondary | ICD-10-CM | POA: Insufficient documentation

## 2016-05-24 DIAGNOSIS — Z9841 Cataract extraction status, right eye: Secondary | ICD-10-CM | POA: Insufficient documentation

## 2016-05-24 DIAGNOSIS — Z90722 Acquired absence of ovaries, bilateral: Secondary | ICD-10-CM | POA: Diagnosis not present

## 2016-05-24 DIAGNOSIS — Z9842 Cataract extraction status, left eye: Secondary | ICD-10-CM | POA: Insufficient documentation

## 2016-05-24 DIAGNOSIS — H3530 Unspecified macular degeneration: Secondary | ICD-10-CM | POA: Insufficient documentation

## 2016-05-24 DIAGNOSIS — Z90710 Acquired absence of both cervix and uterus: Secondary | ICD-10-CM | POA: Insufficient documentation

## 2016-05-24 DIAGNOSIS — I10 Essential (primary) hypertension: Secondary | ICD-10-CM | POA: Insufficient documentation

## 2016-05-24 DIAGNOSIS — M810 Age-related osteoporosis without current pathological fracture: Secondary | ICD-10-CM | POA: Diagnosis not present

## 2016-05-24 DIAGNOSIS — C541 Malignant neoplasm of endometrium: Secondary | ICD-10-CM

## 2016-05-24 DIAGNOSIS — N899 Noninflammatory disorder of vagina, unspecified: Secondary | ICD-10-CM

## 2016-05-24 DIAGNOSIS — C7982 Secondary malignant neoplasm of genital organs: Secondary | ICD-10-CM | POA: Diagnosis not present

## 2016-05-24 DIAGNOSIS — Z8673 Personal history of transient ischemic attack (TIA), and cerebral infarction without residual deficits: Secondary | ICD-10-CM | POA: Insufficient documentation

## 2016-05-24 DIAGNOSIS — Z9012 Acquired absence of left breast and nipple: Secondary | ICD-10-CM | POA: Diagnosis not present

## 2016-05-24 DIAGNOSIS — Z809 Family history of malignant neoplasm, unspecified: Secondary | ICD-10-CM | POA: Diagnosis not present

## 2016-05-24 DIAGNOSIS — M1990 Unspecified osteoarthritis, unspecified site: Secondary | ICD-10-CM | POA: Insufficient documentation

## 2016-05-24 DIAGNOSIS — I4891 Unspecified atrial fibrillation: Secondary | ICD-10-CM | POA: Insufficient documentation

## 2016-05-24 DIAGNOSIS — Z8619 Personal history of other infectious and parasitic diseases: Secondary | ICD-10-CM | POA: Insufficient documentation

## 2016-05-24 DIAGNOSIS — Z9889 Other specified postprocedural states: Secondary | ICD-10-CM | POA: Diagnosis not present

## 2016-05-24 DIAGNOSIS — Z853 Personal history of malignant neoplasm of breast: Secondary | ICD-10-CM | POA: Insufficient documentation

## 2016-05-24 DIAGNOSIS — Z8249 Family history of ischemic heart disease and other diseases of the circulatory system: Secondary | ICD-10-CM | POA: Insufficient documentation

## 2016-05-24 DIAGNOSIS — C52 Malignant neoplasm of vagina: Secondary | ICD-10-CM | POA: Diagnosis not present

## 2016-05-24 DIAGNOSIS — Z888 Allergy status to other drugs, medicaments and biological substances status: Secondary | ICD-10-CM | POA: Insufficient documentation

## 2016-05-24 HISTORY — DX: Secondary malignant neoplasm of genital organs: C79.82

## 2016-05-24 LAB — BASIC METABOLIC PANEL
Anion Gap: 8 mEq/L (ref 3–11)
BUN: 24 mg/dL (ref 7.0–26.0)
CALCIUM: 9.5 mg/dL (ref 8.4–10.4)
CHLORIDE: 104 meq/L (ref 98–109)
CO2: 27 meq/L (ref 22–29)
Creatinine: 0.9 mg/dL (ref 0.6–1.1)
EGFR: 52 mL/min/{1.73_m2} — AB (ref >90)
Glucose: 92 mg/dl (ref 70–140)
Potassium: 4.2 mEq/L (ref 3.5–5.1)
SODIUM: 139 meq/L (ref 136–145)

## 2016-05-24 NOTE — Progress Notes (Signed)
Radiation Oncology         [phone] ________________________________  Name: Sheri Hensley MRN: [id number]  Date: 05/24/2016  DOB: May 17, 1919  Re-evaluation  Note  CC: Angelina Sheriff, MD  Everitt Amber, MD    ICD-9-CM ICD-10-CM   1. 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 Endometrial cancer ([place]) 182.0 C54.1     Diagnosis: Presumed recurrent grade 2 endometrioid endometrial cancer ( biopsy results pending )  Narrative:  The patient returns today for re-evaluation. The patient was initially seen for evaluation for external beam/brachytherapy on 02/18/16. She declined treatment at that time. She began experiencing vaginal spotting in April 2018 that became heavier in the first week of May. She was seen by Dr. Denman George this morning who recommends salvage radiation with external beam and vaginal brachytherapy. A biopsy was taken of the lesion from the left vaginal fornix.    Patient reports occasionally feeling like she is not emptying her bladder completely. She reports having some constipation and straining with bowel movments over the past few days. She denies weight changes, nausea/vomiting, or pain at this time. Patient's daughter reports the patient began passing "significant blood clots" last night.                             ALLERGIES:  is allergic to ativan [lorazepam] and flagyl [metronidazole].  Meds: Current Outpatient Prescriptions  Medication Sig Dispense Refill  . acetaminophen (TYLENOL) 500 MG tablet Take 500 mg by mouth every 6 (six) hours as needed for moderate pain or headache.    . ALPRAZolam (XANAX) 0.25 MG tablet Take 0.125-0.25 mg by mouth daily as needed for anxiety.     Marland Kitchen amiodarone (PACERONE) 200 MG tablet Take 100 mg by mouth every morning.   3  . aspirin EC 81 MG EC tablet Take 1 tablet (81 mg total) by mouth daily. 30 tablet 11  . atorvastatin (LIPITOR) 10 MG tablet Take 10 mg by mouth at bedtime.    . betamethasone  acetate-betamethasone sodium phosphate (CELESTONE) 6 (3-3) MG/ML injection Inject 12 mg into the articular space every 3 (three) months. Next injection due end of January    . bimatoprost (LUMIGAN) 0.01 % SOLN Place 1 drop into both eyes at bedtime.    . brimonidine (ALPHAGAN P) 0.1 % SOLN Place 1 drop into both eyes every 8 (eight) hours.     . clopidogrel (PLAVIX) 75 MG tablet Take 1 tablet (75 mg total) by mouth daily. (Patient taking differently: Take 75 mg by mouth daily. Will stop prior to surgery on 01-17-16 and will start 81 mg Aspirin) 30 tablet 0  . cyanocobalamin (V-R VITAMIN B-12) 500 MCG tablet Take 500 mcg by mouth daily.     Marland Kitchen docusate sodium (COLACE) 100 MG capsule Take 100 mg by mouth 2 (two) times daily.    Marland Kitchen ibuprofen (ADVIL,MOTRIN) 600 MG tablet Take 1 tablet (600 mg total) by mouth every 6 (six) hours as needed (mild pain). 30 tablet 0  . lisinopril (PRINIVIL,ZESTRIL) 10 MG tablet Take 10 mg by mouth every morning.     . loratadine (CLARITIN) 10 MG tablet Take 10 mg by mouth daily as needed for allergies.    . Melatonin 10 MG CAPS Take 10 mg by mouth at bedtime.     . metoprolol tartrate (LOPRESSOR) 25 MG tablet Take 25 mg by mouth 2 (two) times daily.     Marland Kitchen  Multiple Vitamins-Minerals (CENTRUM VITAMINTS PO) Take 1 tablet by mouth every morning.    . Multiple Vitamins-Minerals (ICAPS AREDS 2) CAPS Take 1 tablet by mouth 2 (two) times daily.     Marland Kitchen omeprazole (PRILOSEC) 20 MG capsule Take 20 mg by mouth every morning.  1  . senna (SENOKOT) 8.6 MG TABS tablet Take 1 tablet (8.6 mg total) by mouth at bedtime. 120 each 0  . sertraline (ZOLOFT) 50 MG tablet Take 50 mg by mouth daily.    . traMADol (ULTRAM) 50 MG tablet Take 1 tablet (50 mg total) by mouth every 6 (six) hours as needed for severe pain. 30 tablet 0   No current facility-administered medications for this encounter.     Physical Findings: The patient is in no acute distress. Patient is alert and oriented.  height is  5\' 4"  (1.626 m) and weight is 130 lb 3.2 oz (59.1 kg). Her oral temperature is 98.2 F (36.8 C). Her blood pressure is 183/61 (abnormal) and her pulse is 61. Her oxygen saturation is 100%. .  No significant changes.Lungs are clear to auscultation bilaterally. Heart has regular rate and rhythm. No palpable cervical, supraclavicular, or axillary adenopathy. Abdomen soft, non-tender, normal bowel sounds. Pelvic exam deferred in light of biopsy earlier today Exam report earlier today with Dr. Denman George was reviewed.  Lab Findings: Lab Results  Component Value Date   WBC 14.5 (H) 01/28/2016   HGB 10.8 (L) 01/28/2016   HCT 32.4 (L) 01/28/2016   MCV 89.5 01/28/2016   PLT 155 01/28/2016    Radiographic Findings: No results found.  Impression: Presumed recurrent grade 2 endometrioid endometrial cancer, biopsy pending. The estimated size of the recurrence is somewhat concerning given that her surgery was just a few months ago. She is undecided at this point if she will proceed with palliative radiation therapy or a more curative approach with external beam and vaginal brachytherapy (pending limited recurrence on CT scans) or whether to proceed with Hospice. I discussed the course of treatment, side effects, and potential toxicities of external beam radiation and brachytherapy with the patient and her daughter. Doubt brachytherapy alone would cover the disease recurrence based on Dr. Serita Grit exam earlier today.   Plan: Patient is scheduled for a CT scan of the chest, abdomen, and pelvis on 05/31/16. I have  tentatively scheduled patient for CT simulation and treatment planning on 06/01/16 for external beam and will cancel if she decides against treatment.   ____________________________________  This document serves as a record of services personally performed by Gery Pray, MD. It was created on his behalf by Bethann Humble, a trained medical scribe. The creation of this record is based on the scribe's  personal observations and the provider's statements to them. This document has been checked and approved by the attending provider.

## 2016-05-24 NOTE — Progress Notes (Signed)
Please see the Nurse Progress Note in the MD Initial Consult Encounter for this patient. 

## 2016-05-24 NOTE — Progress Notes (Signed)
Follow-up Note: Gyn-Onc  Consult was initially requested by Dr. Berenice Primas for the evaluation of Sheri Hensley 83 y.o. female  CC:  Chief Complaint  Patient presents with  . Endometrial Cancer    Assessment/Plan:  Sheri Hensley  is a 83 y.o.  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  I counseled Sheri Hensley and her daughter that I believe that she has a vaginal/pelvic recurrence. We will confirm this with CT imaging to rule out distant disease. If confined to the pelvis, I am recommending salvage radiation with external beam and vaginal brachytherapy. I do not think that she is a good candidate due to her age for radiosensitizing chemotherapy (and there is no conclusive data to support its use in the treatment of recurrent endometrial cancer).  The patient seems to be struggling with this news and still is ambivalent about radiation. I explained that I have no alternative recommendations. I recommended an alternative would be a short palliative RT course to stop bleeding followed by hospice care.  She will discuss further with Dr Sondra Come and make considerations for salvage therapy.  HPI: Sheri Hensley is a 83 year old woman (G1P1) who is seen in consultation at the request of Dr Berenice Primas due to post-menopausal bleeding.  The patient has a history of postmenopausal bleeding since early September 2017. It was initially light spotting, then became slightly more persistent over time.  She was seen by Dr Berenice Primas on 10/20/15 where a TVUS was performed and demonstrated a uterus measuring 6.25x5.2x4.19cm but with a thickened endometrium of 2cm. The notes document that an office biopsy was not possible due to narrowed vagina, an Dr Berenice Primas did not think she would be able to accomplish a D&C in the operating room.  The patient is relatively healthy for her  advanced age. She has a history of an arrythmia for which she takes amiodarone. She also takes Plavix for a presumed TIA. She otherwise has a medical history significant for a prior appendectomy and 1 prior vaginal delivery.   On 11/20/15 she underwent D&C and pathology revealed grade 2 endomettrioid endometrial adenocarcinoma.  On the morning of her planned surgical date on 12/23/15 she was diagnosed with a TIA.  She has resolved all symptoms (right hand weakness). She was restarted on Plavix and baby ASA.  Surgery was rescheduled for 01/27/16 and a robotic assisted total hysterectomy and BSO was performed. Upper vaginal laceration was incurred due to size of specimen and vaginal retrieval.   Final pathology revealed a 5.1cm grade 2 tumor with deep (1.8 of 1.8cm myometrial invasion with LVSI identified). No cervical or ovarian involvement was appreciated. In accordance with NCCN guidelines adjuvant radiation was recommended.  She was extensively counseled regarding the indication for radiation and goals of therapy. She met with Dr Sondra Come however declined brachytherapy because she did not want "that thing up in me".   Interval Hx:  She began experiencing vaginal spotting in April, 2018 and this became heavier in the first week of May, 2018.   Current Meds:  Outpatient Encounter Prescriptions as of 05/24/2016  Medication Sig  . acetaminophen (TYLENOL) 500 MG tablet Take 500 mg by mouth every 6 (six) hours as needed for moderate pain or headache.  . ALPRAZolam (XANAX) 0.25 MG tablet Take 0.125-0.25 mg by mouth daily as needed for anxiety.   Marland Kitchen amiodarone (PACERONE) 200 MG tablet Take 100 mg by mouth every morning.   Marland Kitchen  aspirin EC 81 MG EC tablet Take 1 tablet (81 mg total) by mouth daily.  Marland Kitchen atorvastatin (LIPITOR) 10 MG tablet Take 10 mg by mouth at bedtime.  . betamethasone acetate-betamethasone sodium phosphate (CELESTONE) 6 (3-3) MG/ML injection Inject 12 mg into the articular space every 3 (three)  months. Next injection due end of January  . bimatoprost (LUMIGAN) 0.01 % SOLN Place 1 drop into both eyes at bedtime.  . brimonidine (ALPHAGAN P) 0.1 % SOLN Place 1 drop into both eyes every 8 (eight) hours.   . clopidogrel (PLAVIX) 75 MG tablet Take 1 tablet (75 mg total) by mouth daily. (Patient taking differently: Take 75 mg by mouth daily. Will stop prior to surgery on 01-17-16 and will start 81 mg Aspirin)  . cyanocobalamin (V-R VITAMIN B-12) 500 MCG tablet Take 500 mcg by mouth daily.   Marland Kitchen docusate sodium (COLACE) 100 MG capsule Take 100 mg by mouth 2 (two) times daily.  Marland Kitchen ibuprofen (ADVIL,MOTRIN) 600 MG tablet Take 1 tablet (600 mg total) by mouth every 6 (six) hours as needed (mild pain).  Marland Kitchen lisinopril (PRINIVIL,ZESTRIL) 10 MG tablet Take 10 mg by mouth every morning.   . loratadine (CLARITIN) 10 MG tablet Take 10 mg by mouth daily as needed for allergies.  . Melatonin 10 MG CAPS Take 10 mg by mouth at bedtime.   . metoprolol tartrate (LOPRESSOR) 25 MG tablet Take 25 mg by mouth 2 (two) times daily.   . Multiple Vitamins-Minerals (CENTRUM VITAMINTS PO) Take 1 tablet by mouth every morning.  . Multiple Vitamins-Minerals (ICAPS AREDS 2) CAPS Take 1 tablet by mouth 2 (two) times daily.   Marland Kitchen omeprazole (PRILOSEC) 20 MG capsule Take 20 mg by mouth every morning.  . senna (SENOKOT) 8.6 MG TABS tablet Take 1 tablet (8.6 mg total) by mouth at bedtime.  . sertraline (ZOLOFT) 50 MG tablet Take 50 mg by mouth daily.  . traMADol (ULTRAM) 50 MG tablet Take 1 tablet (50 mg total) by mouth every 6 (six) hours as needed for severe pain.   No facility-administered encounter medications on file as of 05/24/2016.     Allergy:  Allergies  Allergen Reactions  . Ativan [Lorazepam]     "HALLUCINATIONS"  . Flagyl [Metronidazole] Rash    Social Hx:   Social History   Social History  . Marital status: Widowed    Spouse name: N/A  . Number of children: 1  . Years of education: N/A   Occupational  History  . Not on file.   Social History Main Topics  . Smoking status: Never Smoker  . Smokeless tobacco: Never Used  . Alcohol use No     Comment: occasional  . Drug use: No  . Sexual activity: No   Other Topics Concern  . Not on file   Social History Narrative  . No narrative on file    Past Surgical Hx:  Past Surgical History:  Procedure Laterality Date  . APPENDECTOMY  1954  . CATARACT EXTRACTION W/ INTRAOCULAR LENS  IMPLANT, BILATERAL  1994  . colonscopy    . DILATION AND CURETTAGE OF UTERUS N/A 11/20/2015   Procedure: DILATATION AND CURETTAGE;  Surgeon: Everitt Amber, MD;  Location: [facility];  Service: Gynecology;  Laterality: N/A;  . DILATION AND CURETTAGE, DIAGNOSTIC / THERAPEUTIC N/A novmber 2017   per daughter with biopsy as stated  . KNEE ARTHROSCOPY Right 2002  . MASTECTOMY Left 1998   w/ lympn node dissection's  . ROBOTIC ASSISTED  TOTAL HYSTERECTOMY WITH BILATERAL SALPINGO OOPHERECTOMY Bilateral 01/27/2016   Procedure: XI ROBOTIC ASSISTED TOTAL HYSTERECTOMY WITH BILATERAL SALPINGO OOPHORECTOMY;  Surgeon: Everitt Amber, MD;  Location: [facility];  Service: Gynecology;  Laterality: Bilateral;    Past Medical Hx:  Past Medical History:  Diagnosis Date  . Atrial fibrillation ([place])   . Bruises easily   . Cancer ([place]) 1998   left tamoxifem x 5 yrs  . Coronary artery disease    CARDIOLOGIST-  DR Geraldo Pitter ([facility])  . Endometrial cancer ([place])   . Endometrial cancer ([place])   . GERD (gastroesophageal reflux disease)   . Glaucoma, both eyes   . History of breast cancer    1998--  s/p  left mastectomy with sln dissection's  ,  no chemo or radiation-  no recurrence  . History of Clostridium difficile infection 5-6 yrs ago  . History of TIAs 12/22/2015   yrs ago ? AND QUESTIONABLE ONE YRS AGO  . Hyperlipidemia   . Hypertension   . Macular degeneration   . Narrow complex tachycardia [facility])    cardiologist-  dr Salley Scarlet  . OA  (osteoarthritis)    right knee  . PMB (postmenopausal bleeding)   . PONV (postoperative nausea and vomiting)    ponv after appendectomy 1954, DID WELL AFTER D AND C RECENT  . Thickened endometrium   . Wears glasses     Past Gynecological History:  G1 (SVD) No LMP recorded. Patient is postmenopausal.  Family Hx:  Family History  Problem Relation Age of Onset  . Hypertension Mother   . Hypertension Father   . Liver cancer Brother   . Colon cancer Brother     Review of Systems:  Constitutional  Feels well,    ENT Normal appearing ears and nares bilaterally Skin/Breast  No rash, sores, jaundice, itching, dryness Cardiovascular  No chest pain, shortness of breath, or edema  Pulmonary  No cough or wheeze.  Gastro Intestinal  No nausea, vomitting, or diarrhoea. No bright red blood per rectum, no abdominal pain, change in bowel movement, or constipation.  Genito Urinary  No frequency, urgency, dysuria, + postmenopausal bleeding Musculo Skeletal  No myalgia, arthralgia, joint swelling or pain  Neurologic  No weakness, numbness, change in gait,  Psychology  No depression, anxiety, insomnia.   Vitals:  Blood pressure (!) 197/67, pulse 60, temperature 98.2 F (36.8 C), temperature source Oral, resp. rate 18, weight 131 lb 8 oz (59.6 kg).  Physical Exam: WD in NAD Neck  Supple NROM, without any enlargements.  Lymph Node Survey No cervical supraclavicular or inguinal adenopathy Cardiovascular  Pulse normal rate, regularity and rhythm. S1 and S2 normal.  Lungs  Clear to auscultation bilateraly, without wheezes/crackles/rhonchi. Good air movement.  Skin  No rash/lesions/breakdown  Psychiatry  Alert and oriented to person, place, and time  Abdomen  Normoactive bowel sounds, abdomen soft, non-tender and thin without evidence of hernia.  Back No CVA tenderness Genito Urinary  Normal external genitalia. Small amount of blood in vagina. 48m polypoid lesion from left  vaginal fornix. On bimanual exam the mass extends deeply into the paravaginal tissues and abuts (but does not obviously involve) the rectum. Measures approximately 5cm. Biopsy taken Rectal  deferred Extremities  No bilateral cyanosis, clubbing or edema.  Procedure: vaginal biopsy  The patient provided verbal consent Time out performed. kevorkian forcep used to take a 132mpiece of tissue from left vaginal fornix. Hemostasis achieved with monsels. EBL: < 2cc Complications: none Specimens: left vaginal  fornix biopsy  [name], MD  05/24/2016, 1:52 PM

## 2016-05-24 NOTE — Progress Notes (Signed)
GYN Location of Tumor / Histology: Endometrial Cancer   Sheri Hensley presented with symptoms of: vaginal spotting in April, 2018 and this became heavier in the first week of May, 2018.  Biopsies revealed:   01/27/16 Diagnosis Uterus +/- tubes/ovaries, neoplastic - INVASIVE FIGO GRADE 2 ENDOMETRIAL ADENOCARCINOMA, ENDOMETRIOID TYPE. - TUMOR SPANS 5.1 CM IN GREATEST DIMENSION. - TUMOR INVADES THROUGH THE UNDERLYING MYOMETRIUM AND IS ADJACENT (LESS THAN 0.1 CM) TO THE SEROSAL SURFACE. - TUMOR IS CONFINED TO THE MYOMETRIUM WITHOUT INVOLVEMENT OF THE CERVICAL STROMA. - LYMPH/VASCULAR INVASION IS IDENTIFIED. - SEE ONCOLOGY TEMPLATE. ADDITIONAL FINDINGS: - MYOMETRIUM DEMONSTRATES ADENOMYOSIS AND LEIOMYOMA FORMATION INCLUDING A CALCIFIED LEIOMYOMA. - BENIGN BILATERAL FALLOPIAN TUBES WITH BENIGN PARATUBAL CYST FORMATION. - BENIGN BILATERAL OVARIES.  Past/Anticipated interventions by Gyn/Onc surgery, if any: 01/27/16 - Procedure: XI ROBOTIC ASSISTED TOTAL HYSTERECTOMY WITH BILATERAL SALPINGO OOPHORECTOMY;  Surgeon: Everitt Amber, MD;Gynecology;  Laterality: Bilateral;  Past/Anticipated interventions by medical oncology, if any: no  Weight changes, if any: {no  Bowel/Bladder complaints, if any: reports occasionally feeling like she is not totally emptying her bladder.  She reports having some constipation and has had to strain with bowel movements for the last few days.  Nausea/Vomiting, if any: no  Pain issues, if any:  no  SAFETY ISSUES:  Prior radiation? no  Pacemaker/ICD? no  Possible current pregnancy? no  Is the patient on methotrexate? no  Current Complaints / other details:  [name] is recommending salvage radiation with external beam and vaginal brachytherapy.  Patient reports that she started having blood clots last night and that is why she came to see [name] today.  BP (!) 183/61 (BP Location: Right Arm, Patient Position: Sitting)   Pulse 61   Temp 98.2 F (36.8  C) (Oral)   Ht 5\' 4"  (1.626 m)   Wt 130 lb 3.2 oz (59.1 kg)   SpO2 100%   BMI 22.35 kg/m    Wt Readings from Last 3 Encounters:  05/24/16 130 lb 3.2 oz (59.1 kg)  05/24/16 131 lb 8 oz (59.6 kg)  02/23/16 128 lb 9.6 oz (58.3 kg)

## 2016-05-24 NOTE — Patient Instructions (Signed)
Plan to meet with Dr. Sondra Come today to discuss radiation options.  CT scan of the chest, abdomen, and pelvis is scheduled for Monday, May 14 at 11:30am at [facility].  See instruction sheet for more details.  We will be checking your kidney function today prior to receiving contrast for the CT scan.  Please call our office at [phone] for any questions or concerns.

## 2016-05-24 NOTE — Telephone Encounter (Signed)
I have called and schedueld appt with Radiation

## 2016-05-25 ENCOUNTER — Telehealth: Payer: Self-pay | Admitting: *Deleted

## 2016-05-25 ENCOUNTER — Telehealth: Payer: Self-pay | Admitting: Gynecologic Oncology

## 2016-05-25 NOTE — Telephone Encounter (Signed)
Patient's daughter informed of biopsy results.  All questions answered.  Ct scheduled for Monday, May 14.  Advised to call for any questions or concerns.

## 2016-05-26 NOTE — Telephone Encounter (Signed)
Open by mistake

## 2016-05-31 ENCOUNTER — Encounter (HOSPITAL_COMMUNITY): Payer: Self-pay

## 2016-05-31 ENCOUNTER — Ambulatory Visit (HOSPITAL_COMMUNITY)
Admission: RE | Admit: 2016-05-31 | Discharge: 2016-05-31 | Disposition: A | Discharge status: Home / Self Care | Payer: Medicare HMO | Source: Ambulatory Visit | Attending: Gynecologic Oncology | Admitting: Gynecologic Oncology

## 2016-05-31 DIAGNOSIS — I700 Atherosclerosis of aorta: Secondary | ICD-10-CM | POA: Diagnosis not present

## 2016-05-31 DIAGNOSIS — I059 Rheumatic mitral valve disease, unspecified: Secondary | ICD-10-CM | POA: Insufficient documentation

## 2016-05-31 DIAGNOSIS — C541 Malignant neoplasm of endometrium: Secondary | ICD-10-CM | POA: Insufficient documentation

## 2016-05-31 DIAGNOSIS — M5136 Other intervertebral disc degeneration, lumbar region: Secondary | ICD-10-CM | POA: Insufficient documentation

## 2016-05-31 DIAGNOSIS — I2510 Atherosclerotic heart disease of native coronary artery without angina pectoris: Secondary | ICD-10-CM | POA: Insufficient documentation

## 2016-05-31 DIAGNOSIS — K5730 Diverticulosis of large intestine without perforation or abscess without bleeding: Secondary | ICD-10-CM | POA: Insufficient documentation

## 2016-05-31 DIAGNOSIS — M4186 Other forms of scoliosis, lumbar region: Secondary | ICD-10-CM | POA: Insufficient documentation

## 2016-05-31 DIAGNOSIS — M47816 Spondylosis without myelopathy or radiculopathy, lumbar region: Secondary | ICD-10-CM | POA: Insufficient documentation

## 2016-05-31 MED ORDER — IOPAMIDOL (ISOVUE-300) INJECTION 61%
INTRAVENOUS | Status: AC
  Administered 2016-05-31: 100 mL
  Filled 2016-05-31: qty 100

## 2016-06-01 ENCOUNTER — Ambulatory Visit
Admission: RE | Admit: 2016-06-01 | Discharge: 2016-06-01 | Disposition: A | Discharge status: Home / Self Care | Payer: Medicare HMO | Source: Ambulatory Visit | Attending: Radiation Oncology | Admitting: Radiation Oncology

## 2016-06-01 ENCOUNTER — Telehealth: Payer: Self-pay | Admitting: Gynecologic Oncology

## 2016-06-01 ENCOUNTER — Encounter: Payer: Self-pay | Admitting: Radiation Oncology

## 2016-06-01 VITALS — BP 176/72 | HR 64 | Temp 98.0°F | Ht 64.0 in | Wt 130.8 lb

## 2016-06-01 DIAGNOSIS — Z8673 Personal history of transient ischemic attack (TIA), and cerebral infarction without residual deficits: Secondary | ICD-10-CM | POA: Diagnosis not present

## 2016-06-01 DIAGNOSIS — C7982 Secondary malignant neoplasm of genital organs: Secondary | ICD-10-CM

## 2016-06-01 DIAGNOSIS — Z90722 Acquired absence of ovaries, bilateral: Secondary | ICD-10-CM | POA: Diagnosis not present

## 2016-06-01 DIAGNOSIS — H409 Unspecified glaucoma: Secondary | ICD-10-CM | POA: Diagnosis not present

## 2016-06-01 DIAGNOSIS — Z8619 Personal history of other infectious and parasitic diseases: Secondary | ICD-10-CM | POA: Diagnosis not present

## 2016-06-01 DIAGNOSIS — Z853 Personal history of malignant neoplasm of breast: Secondary | ICD-10-CM | POA: Diagnosis not present

## 2016-06-01 DIAGNOSIS — C541 Malignant neoplasm of endometrium: Secondary | ICD-10-CM | POA: Diagnosis not present

## 2016-06-01 DIAGNOSIS — E785 Hyperlipidemia, unspecified: Secondary | ICD-10-CM | POA: Diagnosis not present

## 2016-06-01 DIAGNOSIS — I2510 Atherosclerotic heart disease of native coronary artery without angina pectoris: Secondary | ICD-10-CM | POA: Diagnosis not present

## 2016-06-01 DIAGNOSIS — I4891 Unspecified atrial fibrillation: Secondary | ICD-10-CM | POA: Diagnosis not present

## 2016-06-01 DIAGNOSIS — I10 Essential (primary) hypertension: Secondary | ICD-10-CM | POA: Diagnosis not present

## 2016-06-01 NOTE — Progress Notes (Signed)
Radiation Oncology         [phone] ________________________________  Name: Sheri Hensley MRN: [id number]  Date: 06/01/2016  DOB: Dec 11, 1919  Re-evaluation Note  CC: Angelina Sheriff, MD  Angelina Sheriff, MD    ICD-9-CM ICD-10-CM   1. Secondary malignant neoplasm of vagina [facility]) 198.82 C79.82     Diagnosis:   Recurrent endometrial cancer   Narrative:  The patient returns today for further evaluation. Patient did undergo biopsy of the suspicious area in the left vaginal fornix. This did return the consistent with recurrent endometrial cancer.  Patient has completed the imaging studies with a CT scan of the chest abdomen and pelvis. No significant bulk disease was noted in the pelvis,  lymph node metastasis or distant metastasis.  Patient returned today with her daughter to inquire about potential treatment. Today I discussed CT findings with Dr. Denman George.   She does feel the patient does have some disease extension outside the vaginal vault and intracavitary brachytherapy treatments alone would not cover the area of recurrence entirely.  I discussed options for consideration with the patient including palliative radiation therapy over ~ 2 weeks or salvage radiation therapy with curative intent including external beam radiation therapy and intracavitary brachytherapy treatments.  The patient is undecided which treatment approach she would like to proceed with if any. I've asked that she make a decision by this Thursday so that we can schedule her simulation planning  early next week prior to her vacation over the [facility] Day weekend.                              ALLERGIES:  is allergic to ativan [lorazepam] and flagyl [metronidazole].  Meds: Current Outpatient Prescriptions  Medication Sig Dispense Refill  . acetaminophen (TYLENOL) 500 MG tablet Take 500 mg by mouth every 6 (six) hours as needed for moderate pain or headache.    . ALPRAZolam (XANAX) 0.25 MG tablet Take  0.125-0.25 mg by mouth daily as needed for anxiety.     Marland Kitchen amiodarone (PACERONE) 200 MG tablet Take 100 mg by mouth every morning.   3  . aspirin EC 81 MG EC tablet Take 1 tablet (81 mg total) by mouth daily. 30 tablet 11  . atorvastatin (LIPITOR) 10 MG tablet Take 10 mg by mouth at bedtime.    . betamethasone acetate-betamethasone sodium phosphate (CELESTONE) 6 (3-3) MG/ML injection Inject 12 mg into the articular space every 3 (three) months. Next injection due end of January    . bimatoprost (LUMIGAN) 0.01 % SOLN Place 1 drop into both eyes at bedtime.    . brimonidine (ALPHAGAN P) 0.1 % SOLN Place 1 drop into both eyes every 8 (eight) hours.     . clopidogrel (PLAVIX) 75 MG tablet Take 1 tablet (75 mg total) by mouth daily. (Patient taking differently: Take 75 mg by mouth daily. Will stop prior to surgery on 01-17-16 and will start 81 mg Aspirin) 30 tablet 0  . cyanocobalamin (V-R VITAMIN B-12) 500 MCG tablet Take 500 mcg by mouth daily.     Marland Kitchen docusate sodium (COLACE) 100 MG capsule Take 100 mg by mouth 2 (two) times daily.    Marland Kitchen ibuprofen (ADVIL,MOTRIN) 600 MG tablet Take 1 tablet (600 mg total) by mouth every 6 (six) hours as needed (mild pain). 30 tablet 0  . lisinopril (PRINIVIL,ZESTRIL) 10 MG tablet Take 10 mg by mouth every morning.     Marland Kitchen  loratadine (CLARITIN) 10 MG tablet Take 10 mg by mouth daily as needed for allergies.    . Melatonin 10 MG CAPS Take 10 mg by mouth at bedtime.     . metoprolol tartrate (LOPRESSOR) 25 MG tablet Take 25 mg by mouth 2 (two) times daily.     . Multiple Vitamins-Minerals (CENTRUM VITAMINTS PO) Take 1 tablet by mouth every morning.    . Multiple Vitamins-Minerals (ICAPS AREDS 2) CAPS Take 1 tablet by mouth 2 (two) times daily.     Marland Kitchen omeprazole (PRILOSEC) 20 MG capsule Take 20 mg by mouth every morning.  1  . senna (SENOKOT) 8.6 MG TABS tablet Take 1 tablet (8.6 mg total) by mouth at bedtime. 120 each 0  . sertraline (ZOLOFT) 50 MG tablet Take 50 mg by mouth  daily.    . traMADol (ULTRAM) 50 MG tablet Take 1 tablet (50 mg total) by mouth every 6 (six) hours as needed for severe pain. 30 tablet 0   No current facility-administered medications for this encounter.     Physical Findings: The patient is in no acute distress. Patient is alert and oriented.  height is 5\' 4"  (1.626 m) and weight is 130 lb 12.8 oz (59.3 kg). Her oral temperature is 98 F (36.7 C). Her blood pressure is 176/72 (abnormal) and her pulse is 64. Her oxygen saturation is 99%. .  No significant changes.  Lab Findings: Lab Results  Component Value Date   WBC 14.5 (H) 01/28/2016   HGB 10.8 (L) 01/28/2016   HCT 32.4 (L) 01/28/2016   MCV 89.5 01/28/2016   PLT 155 01/28/2016    Radiographic Findings: Ct Chest W Contrast  Result Date: 05/31/2016 CLINICAL DATA:  Endometrial cancer, recent left vaginal cuff biopsy represents pathologically confirmed recurrence. Restaging assessment. EXAM: CT CHEST, ABDOMEN, AND PELVIS WITH CONTRAST TECHNIQUE: Multidetector CT imaging of the chest, abdomen and pelvis was performed following the standard protocol during bolus administration of intravenous contrast. CONTRAST:  <See Chart> ISOVUE-300 IOPAMIDOL (ISOVUE-300) INJECTION 61% COMPARISON:  Multiple exams, including 03/14/2015 FINDINGS: CT CHEST FINDINGS Cardiovascular: Coronary, aortic arch, and branch vessel atherosclerotic vascular disease. Calcified mitral valve. Mediastinum/Nodes: Unremarkable Lungs/Pleura: Biapical pleuroparenchymal scarring. Musculoskeletal: Left mastectomy and axillary dissection. Dextroconvex lower thoracic and upper lumbar scoliosis. CT ABDOMEN PELVIS FINDINGS Hepatobiliary: Unremarkable Pancreas: Unremarkable Spleen: Unremarkable Adrenals/Urinary Tract: Fullness of the left adrenal gland without overt mass. This is stable back from 2013. The kidneys appear unremarkable. Stomach/Bowel: Sigmoid colon diverticulosis with scattered diverticula in the rest of the colon is  well. Vascular/Lymphatic: Aortoiliac atherosclerotic vascular disease. Right external iliac node 6 mm in short axis on image 91/2. No pathologic adenopathy identified in the pelvis. Reproductive: Uterus absent. By report the left vaginal cuff biopsy recently showed recurrent malignancy. There is some slight thickening and accentuated enhancement the left side of the vaginal cuff compared to the right, as on image 103/2, although this is relatively subtle. Other: No supplemental non-categorized findings. Musculoskeletal: Small bilateral direct inguinal hernias contain adipose tissue. Grade 1 degenerative anterolisthesis at L4-5. Along with spondylosis and degenerative disc disease this contributes to moderate right foraminal impingement at the L4-5 level. IMPRESSION: 1. There is some subtle differential enhancement and mild thickening along the left side of the vaginal cuff which may correlate with the recently biopsied lesion in this vicinity. No overt pathologic adenopathy in the chest, abdomen, or pelvis. No other findings of metastatic spread. 2.  Aortic Atherosclerosis (ICD10-I70.0).  Coronary atherosclerosis. 3. Lumbar spondylosis, scoliosis, and degenerative disc  disease with suspected right foraminal impingement at L4-5. 4. Sigmoid colon diverticulosis with scattered diverticula throughout the rest of the colon. 5. Calcified mitral valve. Electronically Signed   By: Van Clines M.D.   On: 05/31/2016 14:37   Ct Abdomen Pelvis W Contrast  Result Date: 05/31/2016 CLINICAL DATA:  Endometrial cancer, recent left vaginal cuff biopsy represents pathologically confirmed recurrence. Restaging assessment. EXAM: CT CHEST, ABDOMEN, AND PELVIS WITH CONTRAST TECHNIQUE: Multidetector CT imaging of the chest, abdomen and pelvis was performed following the standard protocol during bolus administration of intravenous contrast. CONTRAST:  <See Chart> ISOVUE-300 IOPAMIDOL (ISOVUE-300) INJECTION 61% COMPARISON:   Multiple exams, including 03/14/2015 FINDINGS: CT CHEST FINDINGS Cardiovascular: Coronary, aortic arch, and branch vessel atherosclerotic vascular disease. Calcified mitral valve. Mediastinum/Nodes: Unremarkable Lungs/Pleura: Biapical pleuroparenchymal scarring. Musculoskeletal: Left mastectomy and axillary dissection. Dextroconvex lower thoracic and upper lumbar scoliosis. CT ABDOMEN PELVIS FINDINGS Hepatobiliary: Unremarkable Pancreas: Unremarkable Spleen: Unremarkable Adrenals/Urinary Tract: Fullness of the left adrenal gland without overt mass. This is stable back from 2013. The kidneys appear unremarkable. Stomach/Bowel: Sigmoid colon diverticulosis with scattered diverticula in the rest of the colon is well. Vascular/Lymphatic: Aortoiliac atherosclerotic vascular disease. Right external iliac node 6 mm in short axis on image 91/2. No pathologic adenopathy identified in the pelvis. Reproductive: Uterus absent. By report the left vaginal cuff biopsy recently showed recurrent malignancy. There is some slight thickening and accentuated enhancement the left side of the vaginal cuff compared to the right, as on image 103/2, although this is relatively subtle. Other: No supplemental non-categorized findings. Musculoskeletal: Small bilateral direct inguinal hernias contain adipose tissue. Grade 1 degenerative anterolisthesis at L4-5. Along with spondylosis and degenerative disc disease this contributes to moderate right foraminal impingement at the L4-5 level. IMPRESSION: 1. There is some subtle differential enhancement and mild thickening along the left side of the vaginal cuff which may correlate with the recently biopsied lesion in this vicinity. No overt pathologic adenopathy in the chest, abdomen, or pelvis. No other findings of metastatic spread. 2.  Aortic Atherosclerosis (ICD10-I70.0).  Coronary atherosclerosis. 3. Lumbar spondylosis, scoliosis, and degenerative disc disease with suspected right foraminal  impingement at L4-5. 4. Sigmoid colon diverticulosis with scattered diverticula throughout the rest of the colon. 5. Calcified mitral valve. Electronically Signed   By: Van Clines M.D.   On: 05/31/2016 14:37    Impression:  Recurrent endometrial cancer. The patient's disease appears to be limited to the vaginal cuff and left upper para- vaginal area.  Plan:  Final treatment details pending the patient's decision concerning treatment.  ____________________________________ Gery Pray, MD

## 2016-06-01 NOTE — Progress Notes (Signed)
Sheri Hensley is here for follow up.  She denies having pain other than a slight headache.  She reports having a small amount of vaginal spotting.  She reports feeling congested today and is coughing.  This started yesterday.  She reports having severe diarrhea from the oral contrast she had yesterday.  BP (!) 176/72 (BP Location: Right Arm, Patient Position: Sitting)   Pulse 64   Temp 98 F (36.7 C) (Oral)   Ht 5\' 4"  (1.626 m)   Wt 130 lb 12.8 oz (59.3 kg)   SpO2 99%   BMI 22.45 kg/m    Wt Readings from Last 3 Encounters:  06/01/16 130 lb 12.8 oz (59.3 kg)  05/24/16 130 lb 3.2 oz (59.1 kg)  05/24/16 131 lb 8 oz (59.6 kg)

## 2016-06-01 NOTE — Telephone Encounter (Signed)
Informed patient's daughter that CT showed localized disease recurrence at the vagina. There is no obvious spread elsewhere at this stage. Therefore we are recommending radiation only (not chemotherapy).   She would like to discuss with the patient and will call the office if they would like to proceed with radiation.  Daughter returned call back.  Asking questions about radiation.  Silvano Bilis with Dr. Clabe Seal office.  She is going to contact the patient.

## 2016-06-03 ENCOUNTER — Telehealth: Payer: Self-pay | Admitting: Oncology

## 2016-06-03 NOTE — Telephone Encounter (Signed)
Patient's daughter, Loletha Carrow, called and asked if her mother could have external beam radiation at [place] as it is 2 miles from her house.  Advised her that Dr. Sondra Come will be notified and that we will call her back.

## 2016-06-08 ENCOUNTER — Telehealth: Payer: Self-pay | Admitting: *Deleted

## 2016-06-08 NOTE — Telephone Encounter (Signed)
Called patient to inform of appt. with Dr. Orlene Erm on 06-09-16 @ 10:45 am, spoke with patient's daughter, Mickel Fuchs and she is aware of this appt.

## 2016-06-09 DIAGNOSIS — C541 Malignant neoplasm of endometrium: Secondary | ICD-10-CM | POA: Diagnosis not present

## 2016-06-09 DIAGNOSIS — Z7902 Long term (current) use of antithrombotics/antiplatelets: Secondary | ICD-10-CM | POA: Diagnosis not present

## 2016-06-09 DIAGNOSIS — Z79899 Other long term (current) drug therapy: Secondary | ICD-10-CM | POA: Diagnosis not present

## 2016-06-09 DIAGNOSIS — Z853 Personal history of malignant neoplasm of breast: Secondary | ICD-10-CM | POA: Diagnosis not present

## 2016-06-09 DIAGNOSIS — Z800 Family history of malignant neoplasm of digestive organs: Secondary | ICD-10-CM | POA: Diagnosis not present

## 2016-06-09 NOTE — Addendum Note (Signed)
Encounter addended by: Jacqulyn Liner, RN on: 06/09/2016  4:26 PM<BR>    Actions taken: Charge Capture section accepted

## 2016-06-24 DIAGNOSIS — C7982 Secondary malignant neoplasm of genital organs: Secondary | ICD-10-CM | POA: Diagnosis not present

## 2016-06-24 DIAGNOSIS — Z510 Encounter for antineoplastic radiation therapy: Secondary | ICD-10-CM | POA: Diagnosis not present

## 2016-06-24 DIAGNOSIS — C541 Malignant neoplasm of endometrium: Secondary | ICD-10-CM | POA: Diagnosis not present

## 2016-06-25 DIAGNOSIS — Z510 Encounter for antineoplastic radiation therapy: Secondary | ICD-10-CM | POA: Diagnosis not present

## 2016-06-25 DIAGNOSIS — C7982 Secondary malignant neoplasm of genital organs: Secondary | ICD-10-CM | POA: Diagnosis not present

## 2016-06-25 DIAGNOSIS — C541 Malignant neoplasm of endometrium: Secondary | ICD-10-CM | POA: Diagnosis not present

## 2016-06-28 DIAGNOSIS — C7982 Secondary malignant neoplasm of genital organs: Secondary | ICD-10-CM | POA: Diagnosis not present

## 2016-06-28 DIAGNOSIS — Z510 Encounter for antineoplastic radiation therapy: Secondary | ICD-10-CM | POA: Diagnosis not present

## 2016-06-28 DIAGNOSIS — C541 Malignant neoplasm of endometrium: Secondary | ICD-10-CM | POA: Diagnosis not present

## 2016-06-29 DIAGNOSIS — C541 Malignant neoplasm of endometrium: Secondary | ICD-10-CM | POA: Diagnosis not present

## 2016-06-29 DIAGNOSIS — C7982 Secondary malignant neoplasm of genital organs: Secondary | ICD-10-CM | POA: Diagnosis not present

## 2016-06-29 DIAGNOSIS — Z510 Encounter for antineoplastic radiation therapy: Secondary | ICD-10-CM | POA: Diagnosis not present

## 2016-06-30 DIAGNOSIS — Z510 Encounter for antineoplastic radiation therapy: Secondary | ICD-10-CM | POA: Diagnosis not present

## 2016-06-30 DIAGNOSIS — C7982 Secondary malignant neoplasm of genital organs: Secondary | ICD-10-CM | POA: Diagnosis not present

## 2016-06-30 DIAGNOSIS — C541 Malignant neoplasm of endometrium: Secondary | ICD-10-CM | POA: Diagnosis not present

## 2016-07-01 DIAGNOSIS — C7982 Secondary malignant neoplasm of genital organs: Secondary | ICD-10-CM | POA: Diagnosis not present

## 2016-07-01 DIAGNOSIS — C541 Malignant neoplasm of endometrium: Secondary | ICD-10-CM | POA: Diagnosis not present

## 2016-07-01 DIAGNOSIS — Z510 Encounter for antineoplastic radiation therapy: Secondary | ICD-10-CM | POA: Diagnosis not present

## 2016-07-02 DIAGNOSIS — C7982 Secondary malignant neoplasm of genital organs: Secondary | ICD-10-CM | POA: Diagnosis not present

## 2016-07-02 DIAGNOSIS — Z510 Encounter for antineoplastic radiation therapy: Secondary | ICD-10-CM | POA: Diagnosis not present

## 2016-07-02 DIAGNOSIS — C541 Malignant neoplasm of endometrium: Secondary | ICD-10-CM | POA: Diagnosis not present

## 2016-07-05 DIAGNOSIS — Z510 Encounter for antineoplastic radiation therapy: Secondary | ICD-10-CM | POA: Diagnosis not present

## 2016-07-05 DIAGNOSIS — C7982 Secondary malignant neoplasm of genital organs: Secondary | ICD-10-CM | POA: Diagnosis not present

## 2016-07-05 DIAGNOSIS — C541 Malignant neoplasm of endometrium: Secondary | ICD-10-CM | POA: Diagnosis not present

## 2016-07-06 DIAGNOSIS — C541 Malignant neoplasm of endometrium: Secondary | ICD-10-CM | POA: Diagnosis not present

## 2016-07-06 DIAGNOSIS — C7982 Secondary malignant neoplasm of genital organs: Secondary | ICD-10-CM | POA: Diagnosis not present

## 2016-07-06 DIAGNOSIS — Z510 Encounter for antineoplastic radiation therapy: Secondary | ICD-10-CM | POA: Diagnosis not present

## 2016-07-07 DIAGNOSIS — C7982 Secondary malignant neoplasm of genital organs: Secondary | ICD-10-CM | POA: Diagnosis not present

## 2016-07-07 DIAGNOSIS — C541 Malignant neoplasm of endometrium: Secondary | ICD-10-CM | POA: Diagnosis not present

## 2016-07-07 DIAGNOSIS — Z510 Encounter for antineoplastic radiation therapy: Secondary | ICD-10-CM | POA: Diagnosis not present

## 2016-07-08 DIAGNOSIS — C541 Malignant neoplasm of endometrium: Secondary | ICD-10-CM | POA: Diagnosis not present

## 2016-07-08 DIAGNOSIS — Z510 Encounter for antineoplastic radiation therapy: Secondary | ICD-10-CM | POA: Diagnosis not present

## 2016-07-08 DIAGNOSIS — C7982 Secondary malignant neoplasm of genital organs: Secondary | ICD-10-CM | POA: Diagnosis not present

## 2016-07-09 DIAGNOSIS — C541 Malignant neoplasm of endometrium: Secondary | ICD-10-CM | POA: Diagnosis not present

## 2016-07-09 DIAGNOSIS — C7982 Secondary malignant neoplasm of genital organs: Secondary | ICD-10-CM | POA: Diagnosis not present

## 2016-07-09 DIAGNOSIS — Z510 Encounter for antineoplastic radiation therapy: Secondary | ICD-10-CM | POA: Diagnosis not present

## 2016-07-12 DIAGNOSIS — C7982 Secondary malignant neoplasm of genital organs: Secondary | ICD-10-CM | POA: Diagnosis not present

## 2016-07-12 DIAGNOSIS — Z510 Encounter for antineoplastic radiation therapy: Secondary | ICD-10-CM | POA: Diagnosis not present

## 2016-07-12 DIAGNOSIS — C541 Malignant neoplasm of endometrium: Secondary | ICD-10-CM | POA: Diagnosis not present

## 2016-07-13 DIAGNOSIS — Z510 Encounter for antineoplastic radiation therapy: Secondary | ICD-10-CM | POA: Diagnosis not present

## 2016-07-13 DIAGNOSIS — C7982 Secondary malignant neoplasm of genital organs: Secondary | ICD-10-CM | POA: Diagnosis not present

## 2016-07-13 DIAGNOSIS — C541 Malignant neoplasm of endometrium: Secondary | ICD-10-CM | POA: Diagnosis not present

## 2016-07-14 DIAGNOSIS — C541 Malignant neoplasm of endometrium: Secondary | ICD-10-CM | POA: Diagnosis not present

## 2016-07-14 DIAGNOSIS — Z510 Encounter for antineoplastic radiation therapy: Secondary | ICD-10-CM | POA: Diagnosis not present

## 2016-07-14 DIAGNOSIS — C7982 Secondary malignant neoplasm of genital organs: Secondary | ICD-10-CM | POA: Diagnosis not present

## 2016-07-15 DIAGNOSIS — C7982 Secondary malignant neoplasm of genital organs: Secondary | ICD-10-CM | POA: Diagnosis not present

## 2016-07-15 DIAGNOSIS — C541 Malignant neoplasm of endometrium: Secondary | ICD-10-CM | POA: Diagnosis not present

## 2016-07-15 DIAGNOSIS — Z510 Encounter for antineoplastic radiation therapy: Secondary | ICD-10-CM | POA: Diagnosis not present

## 2016-07-16 DIAGNOSIS — C541 Malignant neoplasm of endometrium: Secondary | ICD-10-CM | POA: Diagnosis not present

## 2016-07-16 DIAGNOSIS — C7982 Secondary malignant neoplasm of genital organs: Secondary | ICD-10-CM | POA: Diagnosis not present

## 2016-07-16 DIAGNOSIS — Z510 Encounter for antineoplastic radiation therapy: Secondary | ICD-10-CM | POA: Diagnosis not present

## 2016-07-19 DIAGNOSIS — Z510 Encounter for antineoplastic radiation therapy: Secondary | ICD-10-CM | POA: Diagnosis not present

## 2016-07-19 DIAGNOSIS — C541 Malignant neoplasm of endometrium: Secondary | ICD-10-CM | POA: Diagnosis not present

## 2016-07-19 DIAGNOSIS — C50919 Malignant neoplasm of unspecified site of unspecified female breast: Secondary | ICD-10-CM | POA: Diagnosis not present

## 2016-07-19 DIAGNOSIS — C7982 Secondary malignant neoplasm of genital organs: Secondary | ICD-10-CM | POA: Diagnosis not present

## 2016-07-20 DIAGNOSIS — C541 Malignant neoplasm of endometrium: Secondary | ICD-10-CM | POA: Diagnosis not present

## 2016-07-20 DIAGNOSIS — C7982 Secondary malignant neoplasm of genital organs: Secondary | ICD-10-CM | POA: Diagnosis not present

## 2016-07-20 DIAGNOSIS — C50919 Malignant neoplasm of unspecified site of unspecified female breast: Secondary | ICD-10-CM | POA: Diagnosis not present

## 2016-07-20 DIAGNOSIS — Z510 Encounter for antineoplastic radiation therapy: Secondary | ICD-10-CM | POA: Diagnosis not present

## 2016-07-22 ENCOUNTER — Encounter: Payer: Self-pay | Admitting: Cardiology

## 2016-07-22 DIAGNOSIS — C541 Malignant neoplasm of endometrium: Secondary | ICD-10-CM | POA: Diagnosis not present

## 2016-07-22 DIAGNOSIS — C50919 Malignant neoplasm of unspecified site of unspecified female breast: Secondary | ICD-10-CM | POA: Diagnosis not present

## 2016-07-22 DIAGNOSIS — Z510 Encounter for antineoplastic radiation therapy: Secondary | ICD-10-CM | POA: Diagnosis not present

## 2016-07-22 DIAGNOSIS — C7982 Secondary malignant neoplasm of genital organs: Secondary | ICD-10-CM | POA: Diagnosis not present

## 2016-07-23 DIAGNOSIS — C50919 Malignant neoplasm of unspecified site of unspecified female breast: Secondary | ICD-10-CM | POA: Diagnosis not present

## 2016-07-23 DIAGNOSIS — C7982 Secondary malignant neoplasm of genital organs: Secondary | ICD-10-CM | POA: Diagnosis not present

## 2016-07-23 DIAGNOSIS — C541 Malignant neoplasm of endometrium: Secondary | ICD-10-CM | POA: Diagnosis not present

## 2016-07-23 DIAGNOSIS — Z510 Encounter for antineoplastic radiation therapy: Secondary | ICD-10-CM | POA: Diagnosis not present

## 2016-07-26 DIAGNOSIS — C50919 Malignant neoplasm of unspecified site of unspecified female breast: Secondary | ICD-10-CM | POA: Diagnosis not present

## 2016-07-26 DIAGNOSIS — Z510 Encounter for antineoplastic radiation therapy: Secondary | ICD-10-CM | POA: Diagnosis not present

## 2016-07-26 DIAGNOSIS — C541 Malignant neoplasm of endometrium: Secondary | ICD-10-CM | POA: Diagnosis not present

## 2016-07-26 DIAGNOSIS — C7982 Secondary malignant neoplasm of genital organs: Secondary | ICD-10-CM | POA: Diagnosis not present

## 2016-07-27 DIAGNOSIS — C541 Malignant neoplasm of endometrium: Secondary | ICD-10-CM | POA: Diagnosis not present

## 2016-07-27 DIAGNOSIS — C50919 Malignant neoplasm of unspecified site of unspecified female breast: Secondary | ICD-10-CM | POA: Diagnosis not present

## 2016-07-27 DIAGNOSIS — C7982 Secondary malignant neoplasm of genital organs: Secondary | ICD-10-CM | POA: Diagnosis not present

## 2016-07-27 DIAGNOSIS — Z510 Encounter for antineoplastic radiation therapy: Secondary | ICD-10-CM | POA: Diagnosis not present

## 2016-07-28 DIAGNOSIS — Z510 Encounter for antineoplastic radiation therapy: Secondary | ICD-10-CM | POA: Diagnosis not present

## 2016-07-28 DIAGNOSIS — C50919 Malignant neoplasm of unspecified site of unspecified female breast: Secondary | ICD-10-CM | POA: Diagnosis not present

## 2016-07-28 DIAGNOSIS — C7982 Secondary malignant neoplasm of genital organs: Secondary | ICD-10-CM | POA: Diagnosis not present

## 2016-07-28 DIAGNOSIS — C541 Malignant neoplasm of endometrium: Secondary | ICD-10-CM | POA: Diagnosis not present

## 2016-07-29 DIAGNOSIS — Z1231 Encounter for screening mammogram for malignant neoplasm of breast: Secondary | ICD-10-CM | POA: Diagnosis not present

## 2016-07-29 DIAGNOSIS — C7982 Secondary malignant neoplasm of genital organs: Secondary | ICD-10-CM | POA: Diagnosis not present

## 2016-07-29 DIAGNOSIS — Z510 Encounter for antineoplastic radiation therapy: Secondary | ICD-10-CM | POA: Diagnosis not present

## 2016-07-29 DIAGNOSIS — C541 Malignant neoplasm of endometrium: Secondary | ICD-10-CM | POA: Diagnosis not present

## 2016-07-29 DIAGNOSIS — C50919 Malignant neoplasm of unspecified site of unspecified female breast: Secondary | ICD-10-CM | POA: Diagnosis not present

## 2016-07-30 DIAGNOSIS — C541 Malignant neoplasm of endometrium: Secondary | ICD-10-CM | POA: Diagnosis not present

## 2016-07-30 DIAGNOSIS — C7982 Secondary malignant neoplasm of genital organs: Secondary | ICD-10-CM | POA: Diagnosis not present

## 2016-07-30 DIAGNOSIS — C50919 Malignant neoplasm of unspecified site of unspecified female breast: Secondary | ICD-10-CM | POA: Diagnosis not present

## 2016-07-30 DIAGNOSIS — Z510 Encounter for antineoplastic radiation therapy: Secondary | ICD-10-CM | POA: Diagnosis not present

## 2016-08-02 DIAGNOSIS — C7982 Secondary malignant neoplasm of genital organs: Secondary | ICD-10-CM | POA: Diagnosis not present

## 2016-08-02 DIAGNOSIS — C50919 Malignant neoplasm of unspecified site of unspecified female breast: Secondary | ICD-10-CM | POA: Diagnosis not present

## 2016-08-02 DIAGNOSIS — Z853 Personal history of malignant neoplasm of breast: Secondary | ICD-10-CM | POA: Diagnosis not present

## 2016-08-02 DIAGNOSIS — C541 Malignant neoplasm of endometrium: Secondary | ICD-10-CM | POA: Diagnosis not present

## 2016-08-02 DIAGNOSIS — Z170 Estrogen receptor positive status [ER+]: Secondary | ICD-10-CM | POA: Diagnosis not present

## 2016-08-03 DIAGNOSIS — C541 Malignant neoplasm of endometrium: Secondary | ICD-10-CM | POA: Diagnosis not present

## 2016-08-03 DIAGNOSIS — Z853 Personal history of malignant neoplasm of breast: Secondary | ICD-10-CM | POA: Diagnosis not present

## 2016-08-03 DIAGNOSIS — H6123 Impacted cerumen, bilateral: Secondary | ICD-10-CM | POA: Diagnosis not present

## 2016-08-03 DIAGNOSIS — C7982 Secondary malignant neoplasm of genital organs: Secondary | ICD-10-CM | POA: Diagnosis not present

## 2016-08-03 DIAGNOSIS — Z510 Encounter for antineoplastic radiation therapy: Secondary | ICD-10-CM | POA: Diagnosis not present

## 2016-08-04 DIAGNOSIS — C541 Malignant neoplasm of endometrium: Secondary | ICD-10-CM | POA: Diagnosis not present

## 2016-08-04 DIAGNOSIS — C7982 Secondary malignant neoplasm of genital organs: Secondary | ICD-10-CM | POA: Diagnosis not present

## 2016-08-04 DIAGNOSIS — Z853 Personal history of malignant neoplasm of breast: Secondary | ICD-10-CM | POA: Diagnosis not present

## 2016-08-04 DIAGNOSIS — Z510 Encounter for antineoplastic radiation therapy: Secondary | ICD-10-CM | POA: Diagnosis not present

## 2016-08-05 DIAGNOSIS — C7982 Secondary malignant neoplasm of genital organs: Secondary | ICD-10-CM | POA: Diagnosis not present

## 2016-08-05 DIAGNOSIS — C541 Malignant neoplasm of endometrium: Secondary | ICD-10-CM | POA: Diagnosis not present

## 2016-08-05 DIAGNOSIS — Z510 Encounter for antineoplastic radiation therapy: Secondary | ICD-10-CM | POA: Diagnosis not present

## 2016-08-06 DIAGNOSIS — C541 Malignant neoplasm of endometrium: Secondary | ICD-10-CM | POA: Diagnosis not present

## 2016-08-06 DIAGNOSIS — C7982 Secondary malignant neoplasm of genital organs: Secondary | ICD-10-CM | POA: Diagnosis not present

## 2016-08-06 DIAGNOSIS — Z510 Encounter for antineoplastic radiation therapy: Secondary | ICD-10-CM | POA: Diagnosis not present

## 2016-08-09 DIAGNOSIS — C541 Malignant neoplasm of endometrium: Secondary | ICD-10-CM | POA: Diagnosis not present

## 2016-08-09 DIAGNOSIS — C7982 Secondary malignant neoplasm of genital organs: Secondary | ICD-10-CM | POA: Diagnosis not present

## 2016-08-09 DIAGNOSIS — Z510 Encounter for antineoplastic radiation therapy: Secondary | ICD-10-CM | POA: Diagnosis not present

## 2016-08-10 ENCOUNTER — Ambulatory Visit (INDEPENDENT_AMBULATORY_CARE_PROVIDER_SITE_OTHER): Payer: Medicare HMO | Admitting: Cardiology

## 2016-08-10 ENCOUNTER — Encounter: Payer: Self-pay | Admitting: Cardiology

## 2016-08-10 VITALS — BP 160/62 | HR 80 | Ht 64.0 in | Wt 127.1 lb

## 2016-08-10 DIAGNOSIS — G459 Transient cerebral ischemic attack, unspecified: Secondary | ICD-10-CM

## 2016-08-10 DIAGNOSIS — I10 Essential (primary) hypertension: Secondary | ICD-10-CM

## 2016-08-10 DIAGNOSIS — C7982 Secondary malignant neoplasm of genital organs: Secondary | ICD-10-CM | POA: Diagnosis not present

## 2016-08-10 DIAGNOSIS — I480 Paroxysmal atrial fibrillation: Secondary | ICD-10-CM

## 2016-08-10 DIAGNOSIS — Z510 Encounter for antineoplastic radiation therapy: Secondary | ICD-10-CM | POA: Diagnosis not present

## 2016-08-10 DIAGNOSIS — C541 Malignant neoplasm of endometrium: Secondary | ICD-10-CM | POA: Diagnosis not present

## 2016-08-10 NOTE — Patient Instructions (Signed)
Medication Instructions:  Your physician recommends that you continue on your current medications as directed. Please refer to the Current Medication list given to you today.   Labwork: Your physician recommends that you return for lab work in: today. BMP, CBC, TSH, LFT, Lipid   Testing/Procedures: You had an EKG today.  Follow-Up: Your physician wants you to follow-up in: 6 months. You will receive a reminder letter in the mail two months in advance. If you don't receive a letter, please call our office to schedule the follow-up appointment.   Any Other Special Instructions Will Be Listed Below (If Applicable).     If you need a refill on your cardiac medications before your next appointment, please call your pharmacy.

## 2016-08-10 NOTE — Progress Notes (Signed)
Cardiology Office Note:    Date:  08/10/2016   ID:  [name], DOB Dec 26, 1919, MRN [id number]  PCP:  Angelina Sheriff, MD  Cardiologist:  Jenean Lindau, MD   Referring MD: Angelina Sheriff, MD    ASSESSMENT:    1. Transient cerebral ischemia, unspecified type   2. Essential hypertension   3. Paroxysmal atrial fibrillation (HCC)    PLAN:    In order of problems listed above:  1. Secondary prevention stressed to the patient. Importance of compliance with diet and medications stressed. 2. Patient will have blood work today including fasting lipids 3. Her blood pressure is elevated however her daughter and the patient insists that she has an element of whitecoat hypertension. Her blood pressures done at home are acceptable and she showed me her record. I will not titrate any of her medications especially in the view of the fact that she's had dizziness in the past and he would refrain from being too aggressive with blood pressure control in this lady with advanced age. 4. She will be seen in follow-up appointment in 6 months or earlier if she has any concerns. She is not on anticoagulant for atrial fibrillation because of advanced age and unstable gait.I discussed with the patient atrial fibrillation, disease process. Management and therapy including rate and rhythm control, anticoagulation benefits and potential risks were discussed extensively with the patient. Patient had multiple questions which were answered to patient's satisfaction.    Medication Adjustments/Labs and Tests Ordered: Current medicines are reviewed at length with the patient today.  Concerns regarding medicines are outlined above.  No orders of the defined types were placed in this encounter.  No orders of the defined types were placed in this encounter.    History of Present Illness:    Sheri Hensley is a 83 y.o. female who is being seen today for the evaluation of Coronary artery  disease at the request of [place], Angelique Blonder, MD. Patient is a pleasant 83 year old female. She is accompanied by her daughter. I have rendered cardiovascular care to her in my previous practice. Now she is here to get established. She has known coronary artery disease, atrial fibrillation, essential hypertension and dyslipidemia. She denies any problems at this time and takes care of activities of daily living. No chest pain orthopnea or PND. At the time of my evaluation she is alert awake oriented and in no distress. She mentions to me that she is Dizzy spells but this quit about 2-3 weeks ago and subsequently she has had no such issues.  Past Medical History:  Diagnosis Date  . Atrial fibrillation ([place])   . Bruises easily   . Cancer ([place]) 1998   left tamoxifem x 5 yrs  . Coronary artery disease    CARDIOLOGIST-  DR Geraldo Pitter ([facility])  . Endometrial cancer ([place])   . Endometrial cancer ([place])   . GERD (gastroesophageal reflux disease)   . Glaucoma, both eyes   . History of breast cancer    1998--  s/p  left mastectomy with sln dissection's  ,  no chemo or radiation-  no recurrence  . History of Clostridium difficile infection 5-6 yrs ago  . History of TIAs 12/22/2015   yrs ago ? AND QUESTIONABLE ONE YRS AGO  . Hyperlipidemia   . Hypertension   . Macular degeneration   . Narrow complex tachycardia [facility])    cardiologist-  dr Salley Scarlet  . OA (osteoarthritis)  right knee  . PMB (postmenopausal bleeding)   . PONV (postoperative nausea and vomiting)    ponv after appendectomy 1954, DID WELL AFTER D AND C RECENT  . Thickened endometrium   . Wears glasses     Past Surgical History:  Procedure Laterality Date  . APPENDECTOMY  1954  . CATARACT EXTRACTION W/ INTRAOCULAR LENS  IMPLANT, BILATERAL  1994  . colonscopy    . DILATION AND CURETTAGE OF UTERUS N/A 11/20/2015   Procedure: DILATATION AND CURETTAGE;  Surgeon: Everitt Amber, MD;  Location: [facility];  Service: Gynecology;  Laterality: N/A;  . DILATION AND CURETTAGE, DIAGNOSTIC / THERAPEUTIC N/A novmber 2017   per daughter with biopsy as stated  . KNEE ARTHROSCOPY Right 2002  . MASTECTOMY Left 1998   w/ lympn node dissection's  . ROBOTIC ASSISTED TOTAL HYSTERECTOMY WITH BILATERAL SALPINGO OOPHERECTOMY Bilateral 01/27/2016   Procedure: XI ROBOTIC ASSISTED TOTAL HYSTERECTOMY WITH BILATERAL SALPINGO OOPHORECTOMY;  Surgeon: Everitt Amber, MD;  Location: [facility];  Service: Gynecology;  Laterality: Bilateral;    Current Medications: Current Meds  Medication Sig  . acetaminophen (TYLENOL) 500 MG tablet Take 500 mg by mouth every 6 (six) hours as needed for moderate pain or headache.  . ALPRAZolam (XANAX) 0.25 MG tablet Take 0.125-0.25 mg by mouth daily as needed for anxiety.   Marland Kitchen amiodarone (PACERONE) 200 MG tablet Take 100 mg by mouth every morning.   Marland Kitchen atorvastatin (LIPITOR) 10 MG tablet Take 10 mg by mouth at bedtime.  . betamethasone acetate-betamethasone sodium phosphate (CELESTONE) 6 (3-3) MG/ML injection Inject 12 mg into the articular space every 3 (three) months. Next injection due end of January  . bimatoprost (LUMIGAN) 0.01 % SOLN Place 1 drop into both eyes at bedtime.  . brimonidine (ALPHAGAN P) 0.1 % SOLN Place 1 drop into both eyes every 8 (eight) hours.   . clopidogrel (PLAVIX) 75 MG tablet Take 1 tablet (75 mg total) by mouth daily. (Patient taking differently: Take 75 mg by mouth daily. Will stop prior to surgery on 01-17-16 and will start 81 mg Aspirin)  . cyanocobalamin (V-R VITAMIN B-12) 500 MCG tablet Take 500 mcg by mouth daily.   Marland Kitchen ibuprofen (ADVIL,MOTRIN) 600 MG tablet Take 1 tablet (600 mg total) by mouth every 6 (six) hours as needed (mild pain).  Marland Kitchen lisinopril (PRINIVIL,ZESTRIL) 10 MG tablet Take 10 mg by mouth every morning.   . loratadine (CLARITIN) 10 MG tablet Take 10 mg by mouth daily as needed for allergies.  . Melatonin 10 MG CAPS Take 10 mg by mouth at  bedtime.   . metoprolol tartrate (LOPRESSOR) 25 MG tablet Take 12.5 mg by mouth 2 (two) times daily.   . Multiple Vitamins-Minerals (CENTRUM VITAMINTS PO) Take 1 tablet by mouth every morning.  . Multiple Vitamins-Minerals (ICAPS AREDS 2) CAPS Take 1 tablet by mouth 2 (two) times daily.   Marland Kitchen omeprazole (PRILOSEC) 20 MG capsule Take 20 mg by mouth every morning.  . sertraline (ZOLOFT) 50 MG tablet Take 50 mg by mouth daily.     Allergies:   Ativan [lorazepam] and Flagyl [metronidazole]   Social History   Social History  . Marital status: Widowed    Spouse name: N/A  . Number of children: 1  . Years of education: N/A   Social History Main Topics  . Smoking status: Never Smoker  . Smokeless tobacco: Never Used  . Alcohol use No     Comment: occasional  . Drug use: No  .  Sexual activity: No   Other Topics Concern  . None   Social History Narrative  . None     Family History: The patient's family history includes Colon cancer in her brother; Hypertension in her father and mother; Liver cancer in her brother.  ROS:   Please see the history of present illness.    All other systems reviewed and are negative.  EKGs/Labs/Other Studies Reviewed:    The following studies were reviewed today: I reviewed previous office records. EKG done today revealed sinus rhythm and nonspecific ST-T changes. Patient will have blood work done today including lipids secondary prevention   Recent Labs: 01/21/2016: ALT 25 01/28/2016: Hemoglobin 10.8; Platelets 155 05/24/2016: BUN 24.0; Creatinine 0.9; Potassium 4.2; Sodium 139  Recent Lipid Panel    Component Value Date/Time   CHOL 132 12/23/2015 0425   TRIG 130 12/23/2015 0425   HDL 41 12/23/2015 0425   CHOLHDL 3.2 12/23/2015 0425   VLDL 26 12/23/2015 0425   LDLCALC 65 12/23/2015 0425    Physical Exam:    VS:  BP (!) 160/62   Pulse 80   Ht 5\' 4"  (1.626 m)   Wt 127 lb 1.3 oz (57.6 kg)   SpO2 96%   BMI 21.81 kg/m     Wt Readings from  Last 3 Encounters:  08/10/16 127 lb 1.3 oz (57.6 kg)  06/01/16 130 lb 12.8 oz (59.3 kg)  05/24/16 130 lb 3.2 oz (59.1 kg)     GEN: Patient is in no acute distress HEENT: Normal NECK: No JVD; No carotid bruits LYMPHATICS: No lymphadenopathy CARDIAC: S1 S2 regular, 2/6 systolic murmur at the apex. RESPIRATORY:  Clear to auscultation without rales, wheezing or rhonchi  ABDOMEN: Soft, non-tender, non-distended MUSCULOSKELETAL:  No edema; No deformity  SKIN: Warm and dry NEUROLOGIC:  Alert and oriented x 3 PSYCHIATRIC:  Normal affect    Signed, Jenean Lindau, MD  08/10/2016 3:48 PM    [facility]

## 2016-08-11 DIAGNOSIS — C541 Malignant neoplasm of endometrium: Secondary | ICD-10-CM | POA: Diagnosis not present

## 2016-08-11 DIAGNOSIS — Z510 Encounter for antineoplastic radiation therapy: Secondary | ICD-10-CM | POA: Diagnosis not present

## 2016-08-11 DIAGNOSIS — C7982 Secondary malignant neoplasm of genital organs: Secondary | ICD-10-CM | POA: Diagnosis not present

## 2016-08-11 LAB — CBC
Hematocrit: 37.1 % (ref 34.0–46.6)
Hemoglobin: 11.9 g/dL (ref 11.1–15.9)
MCH: 27.9 pg (ref 26.6–33.0)
MCHC: 32.1 g/dL (ref 31.5–35.7)
MCV: 87 fL (ref 79–97)
PLATELETS: 134 10*3/uL — AB (ref 150–379)
RBC: 4.26 x10E6/uL (ref 3.77–5.28)
RDW: 15.7 % — AB (ref 12.3–15.4)
WBC: 4.6 10*3/uL (ref 3.4–10.8)

## 2016-08-11 LAB — LIPID PANEL
CHOL/HDL RATIO: 4 ratio (ref 0.0–4.4)
CHOLESTEROL TOTAL: 127 mg/dL (ref 100–199)
HDL: 32 mg/dL — AB (ref >39)
LDL CALC: 31 mg/dL (ref 0–99)
TRIGLYCERIDES: 321 mg/dL — AB (ref 0–149)
VLDL Cholesterol Cal: 64 mg/dL — ABNORMAL HIGH (ref 5–40)

## 2016-08-11 LAB — BASIC METABOLIC PANEL
BUN / CREAT RATIO: 17 (ref 12–28)
BUN: 16 mg/dL (ref 10–36)
CHLORIDE: 102 mmol/L (ref 96–106)
CO2: 24 mmol/L (ref 20–29)
Calcium: 9.1 mg/dL (ref 8.7–10.3)
Creatinine, Ser: 0.96 mg/dL (ref 0.57–1.00)
GFR calc Af Amer: 59 mL/min/{1.73_m2} — ABNORMAL LOW (ref >59)
GFR calc non Af Amer: 51 mL/min/{1.73_m2} — ABNORMAL LOW (ref >59)
GLUCOSE: 92 mg/dL (ref 65–99)
Potassium: 4.4 mmol/L (ref 3.5–5.2)
SODIUM: 142 mmol/L (ref 134–144)

## 2016-08-11 LAB — TSH: TSH: 5.88 u[IU]/mL — ABNORMAL HIGH (ref 0.450–4.500)

## 2016-08-11 LAB — HEPATIC FUNCTION PANEL
ALBUMIN: 4.4 g/dL (ref 3.2–4.6)
ALT: 16 IU/L (ref 0–32)
AST: 23 IU/L (ref 0–40)
Alkaline Phosphatase: 86 IU/L (ref 39–117)
Bilirubin Total: 0.9 mg/dL (ref 0.0–1.2)
Bilirubin, Direct: 0.2 mg/dL (ref 0.00–0.40)
TOTAL PROTEIN: 6.8 g/dL (ref 6.0–8.5)

## 2016-08-12 DIAGNOSIS — Z510 Encounter for antineoplastic radiation therapy: Secondary | ICD-10-CM | POA: Diagnosis not present

## 2016-08-12 DIAGNOSIS — C7982 Secondary malignant neoplasm of genital organs: Secondary | ICD-10-CM | POA: Diagnosis not present

## 2016-08-12 DIAGNOSIS — C541 Malignant neoplasm of endometrium: Secondary | ICD-10-CM | POA: Diagnosis not present

## 2016-08-13 DIAGNOSIS — C541 Malignant neoplasm of endometrium: Secondary | ICD-10-CM | POA: Diagnosis not present

## 2016-08-13 DIAGNOSIS — Z510 Encounter for antineoplastic radiation therapy: Secondary | ICD-10-CM | POA: Diagnosis not present

## 2016-08-13 DIAGNOSIS — C7982 Secondary malignant neoplasm of genital organs: Secondary | ICD-10-CM | POA: Diagnosis not present

## 2016-08-24 DIAGNOSIS — L570 Actinic keratosis: Secondary | ICD-10-CM | POA: Diagnosis not present

## 2016-08-24 DIAGNOSIS — L720 Epidermal cyst: Secondary | ICD-10-CM | POA: Diagnosis not present

## 2016-08-30 DIAGNOSIS — M172 Bilateral post-traumatic osteoarthritis of knee: Secondary | ICD-10-CM | POA: Diagnosis not present

## 2016-09-02 DIAGNOSIS — L720 Epidermal cyst: Secondary | ICD-10-CM | POA: Diagnosis not present

## 2016-09-06 ENCOUNTER — Telehealth: Payer: Self-pay | Admitting: *Deleted

## 2016-09-06 DIAGNOSIS — C541 Malignant neoplasm of endometrium: Secondary | ICD-10-CM | POA: Diagnosis not present

## 2016-09-06 DIAGNOSIS — C7982 Secondary malignant neoplasm of genital organs: Secondary | ICD-10-CM | POA: Diagnosis not present

## 2016-09-06 NOTE — Telephone Encounter (Signed)
Patient's daughter called and moved her appt from September 7th to September 21st.

## 2016-09-24 ENCOUNTER — Ambulatory Visit: Payer: Medicare HMO | Admitting: Gynecologic Oncology

## 2016-09-28 ENCOUNTER — Other Ambulatory Visit: Payer: Self-pay

## 2016-09-28 DIAGNOSIS — H401131 Primary open-angle glaucoma, bilateral, mild stage: Secondary | ICD-10-CM | POA: Diagnosis not present

## 2016-09-28 MED ORDER — AMIODARONE HCL 200 MG PO TABS: 100.0000 mg | ORAL_TABLET | Freq: Every morning | ORAL | 6 refills | Status: DC

## 2016-10-08 ENCOUNTER — Encounter: Payer: Self-pay | Admitting: Gynecologic Oncology

## 2016-10-08 ENCOUNTER — Ambulatory Visit: Payer: Medicare HMO | Admitting: Gynecologic Oncology

## 2016-10-08 ENCOUNTER — Ambulatory Visit: Payer: Medicare HMO | Attending: Gynecologic Oncology | Admitting: Gynecologic Oncology

## 2016-10-08 ENCOUNTER — Ambulatory Visit (HOSPITAL_BASED_OUTPATIENT_CLINIC_OR_DEPARTMENT_OTHER): Payer: Medicare HMO

## 2016-10-08 VITALS — BP 198/74 | HR 64 | Temp 98.4°F | Resp 18 | Ht 64.0 in | Wt 130.4 lb

## 2016-10-08 DIAGNOSIS — H409 Unspecified glaucoma: Secondary | ICD-10-CM | POA: Diagnosis not present

## 2016-10-08 DIAGNOSIS — Z888 Allergy status to other drugs, medicaments and biological substances status: Secondary | ICD-10-CM | POA: Diagnosis not present

## 2016-10-08 DIAGNOSIS — Z7902 Long term (current) use of antithrombotics/antiplatelets: Secondary | ICD-10-CM | POA: Diagnosis not present

## 2016-10-08 DIAGNOSIS — I2510 Atherosclerotic heart disease of native coronary artery without angina pectoris: Secondary | ICD-10-CM | POA: Insufficient documentation

## 2016-10-08 DIAGNOSIS — Z8673 Personal history of transient ischemic attack (TIA), and cerebral infarction without residual deficits: Secondary | ICD-10-CM | POA: Diagnosis not present

## 2016-10-08 DIAGNOSIS — M1711 Unilateral primary osteoarthritis, right knee: Secondary | ICD-10-CM | POA: Insufficient documentation

## 2016-10-08 DIAGNOSIS — R197 Diarrhea, unspecified: Secondary | ICD-10-CM | POA: Insufficient documentation

## 2016-10-08 DIAGNOSIS — Z9012 Acquired absence of left breast and nipple: Secondary | ICD-10-CM | POA: Insufficient documentation

## 2016-10-08 DIAGNOSIS — Z800 Family history of malignant neoplasm of digestive organs: Secondary | ICD-10-CM | POA: Diagnosis not present

## 2016-10-08 DIAGNOSIS — E785 Hyperlipidemia, unspecified: Secondary | ICD-10-CM | POA: Insufficient documentation

## 2016-10-08 DIAGNOSIS — I4891 Unspecified atrial fibrillation: Secondary | ICD-10-CM | POA: Diagnosis not present

## 2016-10-08 DIAGNOSIS — I10 Essential (primary) hypertension: Secondary | ICD-10-CM | POA: Insufficient documentation

## 2016-10-08 DIAGNOSIS — Z79899 Other long term (current) drug therapy: Secondary | ICD-10-CM | POA: Insufficient documentation

## 2016-10-08 DIAGNOSIS — C541 Malignant neoplasm of endometrium: Secondary | ICD-10-CM | POA: Insufficient documentation

## 2016-10-08 DIAGNOSIS — Z8249 Family history of ischemic heart disease and other diseases of the circulatory system: Secondary | ICD-10-CM | POA: Insufficient documentation

## 2016-10-08 DIAGNOSIS — Z7982 Long term (current) use of aspirin: Secondary | ICD-10-CM | POA: Diagnosis not present

## 2016-10-08 DIAGNOSIS — C7982 Secondary malignant neoplasm of genital organs: Secondary | ICD-10-CM

## 2016-10-08 DIAGNOSIS — N899 Noninflammatory disorder of vagina, unspecified: Secondary | ICD-10-CM

## 2016-10-08 DIAGNOSIS — K219 Gastro-esophageal reflux disease without esophagitis: Secondary | ICD-10-CM | POA: Diagnosis not present

## 2016-10-08 DIAGNOSIS — R1030 Lower abdominal pain, unspecified: Secondary | ICD-10-CM | POA: Diagnosis not present

## 2016-10-08 DIAGNOSIS — Z853 Personal history of malignant neoplasm of breast: Secondary | ICD-10-CM | POA: Insufficient documentation

## 2016-10-08 DIAGNOSIS — H3530 Unspecified macular degeneration: Secondary | ICD-10-CM | POA: Insufficient documentation

## 2016-10-08 DIAGNOSIS — Z923 Personal history of irradiation: Secondary | ICD-10-CM | POA: Insufficient documentation

## 2016-10-08 LAB — COMPREHENSIVE METABOLIC PANEL
ALT: 20 U/L (ref 0–55)
ANION GAP: 9 meq/L (ref 3–11)
AST: 26 U/L (ref 5–34)
Albumin: 4.1 g/dL (ref 3.5–5.0)
Alkaline Phosphatase: 97 U/L (ref 40–150)
BILIRUBIN TOTAL: 0.96 mg/dL (ref 0.20–1.20)
BUN: 17.8 mg/dL (ref 7.0–26.0)
CHLORIDE: 105 meq/L (ref 98–109)
CO2: 27 meq/L (ref 22–29)
CREATININE: 1 mg/dL (ref 0.6–1.1)
Calcium: 9.5 mg/dL (ref 8.4–10.4)
EGFR: 48 mL/min/{1.73_m2} — ABNORMAL LOW (ref >90)
Glucose: 97 mg/dl (ref 70–140)
Potassium: 4.2 mEq/L (ref 3.5–5.1)
Sodium: 141 mEq/L (ref 136–145)
TOTAL PROTEIN: 7.1 g/dL (ref 6.4–8.3)

## 2016-10-08 NOTE — Progress Notes (Signed)
Follow-up Note: Gyn-Onc  Consult was initially requested by Dr. Berenice Primas for the evaluation of Sheri Hensley 83 y.o. female  CC:  Chief Complaint  Patient presents with  . Endometrial cancer [facility])    Assessment/Plan:  Sheri Hensley  is a 83 y.o.  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  Concern for persistent disease (deep to vagina)  I discussed with Sheri Hensley and her daughter that I am recommending CT imaging to better characterize the mass that I can feel. I will bring her back after the CT scan to discussion options. However, surgery is not an option in this elderly patient who has received radiation (additionally, it would not be curative). She is declining chemotherapy. She cannot receive additional radiation.  HPI: Sheri Hensley is a 83 year old woman (G1P1) who is seen in consultation at the request of Dr Berenice Primas due to post-menopausal bleeding.  The patient has a history of postmenopausal bleeding since early September 2017. It was initially light spotting, then became slightly more persistent over time.  She was seen by Dr Berenice Primas on 10/20/15 where a TVUS was performed and demonstrated a uterus measuring 6.25x5.2x4.19cm but with a thickened endometrium of 2cm. The notes document that an office biopsy was not possible due to narrowed vagina, an Dr Berenice Primas did not think she would be able to accomplish a D&C in the operating room.  The patient is relatively healthy for her advanced age. She has a history of an arrythmia for which she takes amiodarone. She also takes Plavix for a presumed TIA. She otherwise has a medical history significant for a prior appendectomy and 1 prior vaginal delivery.   On 11/20/15 she underwent D&C and pathology revealed grade 2 endomettrioid endometrial adenocarcinoma.  On the morning of her planned surgical date  on 12/23/15 she was diagnosed with a TIA.  She has resolved all symptoms (right hand weakness). She was restarted on Plavix and baby ASA.  Surgery was rescheduled for 01/27/16 and a robotic assisted total hysterectomy and BSO was performed. Upper vaginal laceration was incurred due to size of specimen and vaginal retrieval.   Final pathology revealed a 5.1cm grade 2 tumor with deep (1.8 of 1.8cm myometrial invasion with LVSI identified). No cervical or ovarian involvement was appreciated. In accordance with NCCN guidelines adjuvant radiation was recommended.  She was extensively counseled regarding the indication for radiation and goals of therapy. She met with Dr Sondra Come however declined brachytherapy because she did not want "that thing up in me".   Interval Hx:  She began experiencing vaginal spotting in April, 2018 and this became heavier in the first week of May, 2018. Biopsy on 05/24/16 revealed recurrent endometrial cancer.  CT scan failed to show significant pelvic extension.  She was recommended to have external beam radiation and vaginal brachytherapy for salvage therapy.  She saw Dr Sondra Come for consultation in May, 2018. However, she elected to receive radiation with Dr Orlene Erm at [place] due to where she lives and difficulty making it to [place] for daily treatments. The patient decided that she did not wish to have vaginal brachytherapy as she was concerned about the discomfort of the intracavitary device. Therefore she exclusively received external beam therapy. She received treatment between 06/28/16 through 08/13/16. She received 5040 cGyn in 28 fractions to the lower pelvis with a 1080 boost in 6 fractions to the paravaginal tissues in lieu of vaginal intracavitary brachytherapy.  Since completing therapy she notes no bleeding. However, she does have intermittent lower abdominal pains and diarrhea.   Current Meds:  Outpatient Encounter Prescriptions as of 10/08/2016  Medication  Sig  . acetaminophen (TYLENOL) 500 MG tablet Take 500 mg by mouth every 6 (six) hours as needed for moderate pain or headache.  . ALPRAZolam (XANAX) 0.25 MG tablet Take 0.125-0.25 mg by mouth daily as needed for anxiety.   Marland Kitchen amiodarone (PACERONE) 200 MG tablet Take 0.5 tablets (100 mg total) by mouth every morning.  Marland Kitchen aspirin EC 81 MG EC tablet Take 1 tablet (81 mg total) by mouth daily.  Marland Kitchen atorvastatin (LIPITOR) 10 MG tablet Take 10 mg by mouth at bedtime.  . betamethasone acetate-betamethasone sodium phosphate (CELESTONE) 6 (3-3) MG/ML injection Inject 12 mg into the articular space every 3 (three) months. Next injection due end of January  . bimatoprost (LUMIGAN) 0.01 % SOLN Place 1 drop into both eyes at bedtime.  . brimonidine (ALPHAGAN P) 0.1 % SOLN Place 1 drop into both eyes every 8 (eight) hours.   . clopidogrel (PLAVIX) 75 MG tablet Take 1 tablet (75 mg total) by mouth daily. (Patient taking differently: Take 75 mg by mouth daily. Will stop prior to surgery on 01-17-16 and will start 81 mg Aspirin)  . cyanocobalamin (V-R VITAMIN B-12) 500 MCG tablet Take 500 mcg by mouth daily.   Marland Kitchen docusate sodium (COLACE) 100 MG capsule Take 100 mg by mouth 2 (two) times daily.  Marland Kitchen ibuprofen (ADVIL,MOTRIN) 600 MG tablet Take 1 tablet (600 mg total) by mouth every 6 (six) hours as needed (mild pain).  Marland Kitchen lisinopril (PRINIVIL,ZESTRIL) 10 MG tablet Take 10 mg by mouth every morning.   . loratadine (CLARITIN) 10 MG tablet Take 10 mg by mouth daily as needed for allergies.  . Melatonin 10 MG CAPS Take 10 mg by mouth at bedtime.   . metoprolol tartrate (LOPRESSOR) 25 MG tablet Take 12.5 mg by mouth 2 (two) times daily.   . Multiple Vitamins-Minerals (CENTRUM VITAMINTS PO) Take 1 tablet by mouth every morning.  . Multiple Vitamins-Minerals (ICAPS AREDS 2) CAPS Take 1 tablet by mouth 2 (two) times daily.   Marland Kitchen omeprazole (PRILOSEC) 20 MG capsule Take 20 mg by mouth every morning.  . senna (SENOKOT) 8.6 MG TABS  tablet Take 1 tablet (8.6 mg total) by mouth at bedtime.  . sertraline (ZOLOFT) 50 MG tablet Take 50 mg by mouth daily.   No facility-administered encounter medications on file as of 10/08/2016.     Allergy:  Allergies  Allergen Reactions  . Ativan [Lorazepam]     "HALLUCINATIONS"  . Flagyl [Metronidazole] Rash    Social Hx:   Social History   Social History  . Marital status: Widowed    Spouse name: N/A  . Number of children: 1  . Years of education: N/A   Occupational History  . Not on file.   Social History Main Topics  . Smoking status: Never Smoker  . Smokeless tobacco: Never Used  . Alcohol use No     Comment: occasional  . Drug use: No  . Sexual activity: No   Other Topics Concern  . Not on file   Social History Narrative  . No narrative on file    Past Surgical Hx:  Past Surgical History:  Procedure Laterality Date  . APPENDECTOMY  1954  . CATARACT EXTRACTION W/ INTRAOCULAR LENS  IMPLANT, BILATERAL  1994  . colonscopy    . DILATION AND CURETTAGE  OF UTERUS N/A 11/20/2015   Procedure: DILATATION AND CURETTAGE;  Surgeon: Everitt Amber, MD;  Location: [facility];  Service: Gynecology;  Laterality: N/A;  . DILATION AND CURETTAGE, DIAGNOSTIC / THERAPEUTIC N/A novmber 2017   per daughter with biopsy as stated  . KNEE ARTHROSCOPY Right 2002  . MASTECTOMY Left 1998   w/ lympn node dissection's  . ROBOTIC ASSISTED TOTAL HYSTERECTOMY WITH BILATERAL SALPINGO OOPHERECTOMY Bilateral 01/27/2016   Procedure: XI ROBOTIC ASSISTED TOTAL HYSTERECTOMY WITH BILATERAL SALPINGO OOPHORECTOMY;  Surgeon: Everitt Amber, MD;  Location: [facility];  Service: Gynecology;  Laterality: Bilateral;    Past Medical Hx:  Past Medical History:  Diagnosis Date  . Atrial fibrillation ([place])   . Bruises easily   . Cancer ([place]) 1998   left tamoxifem x 5 yrs  . Coronary artery disease    CARDIOLOGIST-  DR Geraldo Pitter ([facility])  . Endometrial cancer ([place])   .  Endometrial cancer ([place])   . GERD (gastroesophageal reflux disease)   . Glaucoma, both eyes   . History of breast cancer    1998--  s/p  left mastectomy with sln dissection's  ,  no chemo or radiation-  no recurrence  . History of Clostridium difficile infection 5-6 yrs ago  . History of TIAs 12/22/2015   yrs ago ? AND QUESTIONABLE ONE YRS AGO  . Hyperlipidemia   . Hypertension   . Macular degeneration   . Narrow complex tachycardia [facility])    cardiologist-  dr Salley Scarlet  . OA (osteoarthritis)    right knee  . PMB (postmenopausal bleeding)   . PONV (postoperative nausea and vomiting)    ponv after appendectomy 1954, DID WELL AFTER D AND C RECENT  . Thickened endometrium   . Wears glasses     Past Gynecological History:  G1 (SVD) No LMP recorded. Patient is postmenopausal.  Family Hx:  Family History  Problem Relation Age of Onset  . Hypertension Mother   . Hypertension Father   . Liver cancer Brother   . Colon cancer Brother     Review of Systems:  Constitutional  Feels well,    ENT Normal appearing ears and nares bilaterally Skin/Breast  No rash, sores, jaundice, itching, dryness Cardiovascular  No chest pain, shortness of breath, or edema  Pulmonary  No cough or wheeze.  Gastro Intestinal  No nausea, vomitting, or diarrhoea. No bright red blood per rectum, no abdominal pain, change in bowel movement, or constipation.  Genito Urinary  No frequency, urgency, dysuria, + postmenopausal bleeding Musculo Skeletal  No myalgia, arthralgia, joint swelling or pain  Neurologic  No weakness, numbness, change in gait,  Psychology  No depression, anxiety, insomnia.   Vitals:  Blood pressure (!) 198/74, pulse 64, temperature 98.4 F (36.9 C), temperature source Oral, resp. rate 18, height _0  (1.626 m), weight 130 lb 6.4 oz (59.1 kg), SpO2 100 %.  Physical Exam: WD in NAD Neck  Supple NROM, without any enlargements.  Lymph Node Survey No cervical supraclavicular or  inguinal adenopathy Cardiovascular  Pulse normal rate, regularity and rhythm. S1 and S2 normal.  Lungs  Clear to auscultation bilateraly, without wheezes/crackles/rhonchi. Good air movement.  Skin  No rash/lesions/breakdown  Psychiatry  Alert and oriented to person, place, and time  Abdomen  Normoactive bowel sounds, abdomen soft, non-tender and thin without evidence of hernia.  Back No CVA tenderness Genito Urinary  Normal external genitalia. Residual erythema from prior recurrence site at right vaginal fornix. There is no  gross residual tumor within the vagina. Bimanual rectovaginal exam reveals a cystic fullness measuring 5-6cm beyond the vagina (not adherent to it). Rectal  deferred Extremities  No bilateral cyanosis, clubbing or edema.  [name], MD  10/08/2016, 5:22 PM

## 2016-10-12 ENCOUNTER — Ambulatory Visit (HOSPITAL_COMMUNITY)
Admission: RE | Admit: 2016-10-12 | Discharge: 2016-10-12 | Disposition: A | Discharge status: Home / Self Care | Payer: Medicare HMO | Source: Ambulatory Visit | Attending: Gynecologic Oncology | Admitting: Gynecologic Oncology

## 2016-10-12 ENCOUNTER — Encounter (HOSPITAL_COMMUNITY): Payer: Self-pay

## 2016-10-12 DIAGNOSIS — C541 Malignant neoplasm of endometrium: Secondary | ICD-10-CM | POA: Diagnosis present

## 2016-10-12 DIAGNOSIS — K5730 Diverticulosis of large intestine without perforation or abscess without bleeding: Secondary | ICD-10-CM | POA: Diagnosis not present

## 2016-10-12 DIAGNOSIS — I700 Atherosclerosis of aorta: Secondary | ICD-10-CM | POA: Diagnosis not present

## 2016-10-12 MED ORDER — IOPAMIDOL (ISOVUE-300) INJECTION 61%
30.0000 mL | Freq: Once | INTRAVENOUS | Status: DC | PRN
  Administered 2016-10-12: 30 mL via ORAL
  Filled 2016-10-12: qty 30

## 2016-10-12 MED ORDER — IOPAMIDOL (ISOVUE-300) INJECTION 61%
100.0000 mL | Freq: Once | INTRAVENOUS | Status: AC | PRN
  Administered 2016-10-12: 100 mL via INTRAVENOUS

## 2016-10-12 MED ORDER — IOPAMIDOL (ISOVUE-300) INJECTION 61%
INTRAVENOUS | Status: AC
  Filled 2016-10-12: qty 30

## 2016-10-12 MED ORDER — IOPAMIDOL (ISOVUE-300) INJECTION 61%
INTRAVENOUS | Status: AC
  Filled 2016-10-12: qty 100

## 2016-10-22 ENCOUNTER — Telehealth: Payer: Self-pay | Admitting: Gynecologic Oncology

## 2016-10-22 NOTE — Telephone Encounter (Signed)
Patient's daughter informed of CT scan results.  Advised to continue with appt on Tues.  Advised to call for any needs or concerns.

## 2016-10-26 ENCOUNTER — Ambulatory Visit: Payer: Medicare HMO | Attending: Gynecologic Oncology | Admitting: Gynecologic Oncology

## 2016-10-26 ENCOUNTER — Encounter: Payer: Self-pay | Admitting: Gynecologic Oncology

## 2016-10-26 VITALS — BP 186/91 | HR 58 | Temp 98.5°F | Resp 18 | Wt 128.4 lb

## 2016-10-26 DIAGNOSIS — K219 Gastro-esophageal reflux disease without esophagitis: Secondary | ICD-10-CM | POA: Diagnosis not present

## 2016-10-26 DIAGNOSIS — I4891 Unspecified atrial fibrillation: Secondary | ICD-10-CM | POA: Diagnosis not present

## 2016-10-26 DIAGNOSIS — C541 Malignant neoplasm of endometrium: Secondary | ICD-10-CM | POA: Insufficient documentation

## 2016-10-26 DIAGNOSIS — E785 Hyperlipidemia, unspecified: Secondary | ICD-10-CM | POA: Diagnosis not present

## 2016-10-26 DIAGNOSIS — Z7902 Long term (current) use of antithrombotics/antiplatelets: Secondary | ICD-10-CM | POA: Insufficient documentation

## 2016-10-26 DIAGNOSIS — I2510 Atherosclerotic heart disease of native coronary artery without angina pectoris: Secondary | ICD-10-CM | POA: Insufficient documentation

## 2016-10-26 DIAGNOSIS — Z7982 Long term (current) use of aspirin: Secondary | ICD-10-CM | POA: Diagnosis not present

## 2016-10-26 DIAGNOSIS — R197 Diarrhea, unspecified: Secondary | ICD-10-CM | POA: Insufficient documentation

## 2016-10-26 DIAGNOSIS — I10 Essential (primary) hypertension: Secondary | ICD-10-CM | POA: Insufficient documentation

## 2016-10-26 DIAGNOSIS — Z923 Personal history of irradiation: Secondary | ICD-10-CM

## 2016-10-26 DIAGNOSIS — R1030 Lower abdominal pain, unspecified: Secondary | ICD-10-CM | POA: Diagnosis not present

## 2016-10-26 DIAGNOSIS — M1990 Unspecified osteoarthritis, unspecified site: Secondary | ICD-10-CM | POA: Diagnosis not present

## 2016-10-26 DIAGNOSIS — Z79899 Other long term (current) drug therapy: Secondary | ICD-10-CM | POA: Insufficient documentation

## 2016-10-26 NOTE — Patient Instructions (Signed)
Take metamucil as directed each morning to help with your bowel movements.  Please return to see Dr Denman George in 73months (January, 2019). Call [phone] in November, 2018 to schedule this.

## 2016-10-26 NOTE — Progress Notes (Signed)
Follow-up Note: Gyn-Onc  Consult was initially requested by Dr. Berenice Primas for the evaluation of [name] 83 y.o. female  CC:  Chief Complaint  Patient presents with  . Endometrial cancer [facility])    Assessment/Plan:  Ms. WOODIE TRUSTY  is a 83 y.o.  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  Mass in pelvis consistent with stool on CT.  Follow-up in 3 months for surveillance exam  HPI: Dallas Scorsone is a 83 year old woman (G1P1) who is seen in consultation at the request of Dr Berenice Primas due to post-menopausal bleeding.  The patient has a history of postmenopausal bleeding since early September 2017. It was initially light spotting, then became slightly more persistent over time.  She was seen by Dr Berenice Primas on 10/20/15 where a TVUS was performed and demonstrated a uterus measuring 6.25x5.2x4.19cm but with a thickened endometrium of 2cm. The notes document that an office biopsy was not possible due to narrowed vagina, an Dr Berenice Primas did not think she would be able to accomplish a D&C in the operating room.  The patient is relatively healthy for her advanced age. She has a history of an arrythmia for which she takes amiodarone. She also takes Plavix for a presumed TIA. She otherwise has a medical history significant for a prior appendectomy and 1 prior vaginal delivery.   On 11/20/15 she underwent D&C and pathology revealed grade 2 endomettrioid endometrial adenocarcinoma.  On the morning of her planned surgical date on 12/23/15 she was diagnosed with a TIA.  She has resolved all symptoms (right hand weakness). She was restarted on Plavix and baby ASA.  Surgery was rescheduled for 01/27/16 and a robotic assisted total hysterectomy and BSO was performed. Upper vaginal laceration was incurred due to size of specimen and vaginal retrieval.   Final pathology  revealed a 5.1cm grade 2 tumor with deep (1.8 of 1.8cm myometrial invasion with LVSI identified). No cervical or ovarian involvement was appreciated. In accordance with NCCN guidelines adjuvant radiation was recommended.  She was extensively counseled regarding the indication for radiation and goals of therapy. She met with Dr Sondra Come however declined brachytherapy because she did not want "that thing up in me".   She began experiencing vaginal spotting in April, 2018 and this became heavier in the first week of May, 2018. Biopsy on 05/24/16 revealed recurrent endometrial cancer.  CT scan failed to show significant pelvic extension.  She was recommended to have external beam radiation and vaginal brachytherapy for salvage therapy.  She saw Dr Sondra Come for consultation in May, 2018. However, she elected to receive radiation with Dr Orlene Erm at [place] due to where she lives and difficulty making it to [place] for daily treatments. The patient decided that she did not wish to have vaginal brachytherapy as she was concerned about the discomfort of the intracavitary device. Therefore she exclusively received external beam therapy. She received treatment between 06/28/16 through 08/13/16. She received 5040 cGyn in 28 fractions to the lower pelvis with a 1080 boost in 6 fractions to the paravaginal tissues in lieu of vaginal intracavitary brachytherapy.  Interval Hx: Since completing therapy she notes no bleeding. However, she does have intermittent lower abdominal pains and diarrhea.  Exam on September 21st, 2018 was concerning for a pelvic mass. CT abdo/pelvis on 10/12/16 showed no mass, however there was significant stool burden in the colon.   Current Meds:  Outpatient Encounter Prescriptions as of 10/26/2016  Medication Sig  . acetaminophen (TYLENOL) 500 MG tablet Take 500 mg by mouth every 6 (six) hours as needed for moderate pain or headache.  . ALPRAZolam (XANAX) 0.25 MG tablet Take 0.125-0.25  mg by mouth daily as needed for anxiety.   Marland Kitchen amiodarone (PACERONE) 200 MG tablet Take 0.5 tablets (100 mg total) by mouth every morning.  Marland Kitchen aspirin EC 81 MG EC tablet Take 1 tablet (81 mg total) by mouth daily.  Marland Kitchen atorvastatin (LIPITOR) 10 MG tablet Take 10 mg by mouth at bedtime.  . betamethasone acetate-betamethasone sodium phosphate (CELESTONE) 6 (3-3) MG/ML injection Inject 12 mg into the articular space every 3 (three) months. Next injection due end of January  . bimatoprost (LUMIGAN) 0.01 % SOLN Place 1 drop into both eyes at bedtime.  . brimonidine (ALPHAGAN P) 0.1 % SOLN Place 1 drop into both eyes every 8 (eight) hours.   . clopidogrel (PLAVIX) 75 MG tablet Take 1 tablet (75 mg total) by mouth daily. (Patient taking differently: Take 75 mg by mouth daily. Will stop prior to surgery on 01-17-16 and will start 81 mg Aspirin)  . cyanocobalamin (V-R VITAMIN B-12) 500 MCG tablet Take 500 mcg by mouth daily.   Marland Kitchen docusate sodium (COLACE) 100 MG capsule Take 100 mg by mouth 2 (two) times daily.  Marland Kitchen ibuprofen (ADVIL,MOTRIN) 600 MG tablet Take 1 tablet (600 mg total) by mouth every 6 (six) hours as needed (mild pain).  Marland Kitchen lisinopril (PRINIVIL,ZESTRIL) 10 MG tablet Take 10 mg by mouth every morning.   . loratadine (CLARITIN) 10 MG tablet Take 10 mg by mouth daily as needed for allergies.  . Melatonin 10 MG CAPS Take 10 mg by mouth at bedtime.   . metoprolol tartrate (LOPRESSOR) 25 MG tablet Take 12.5 mg by mouth 2 (two) times daily.   . Multiple Vitamins-Minerals (CENTRUM VITAMINTS PO) Take 1 tablet by mouth every morning.  . Multiple Vitamins-Minerals (ICAPS AREDS 2) CAPS Take 1 tablet by mouth 2 (two) times daily.   Marland Kitchen omeprazole (PRILOSEC) 20 MG capsule Take 20 mg by mouth every morning.  . senna (SENOKOT) 8.6 MG TABS tablet Take 1 tablet (8.6 mg total) by mouth at bedtime.  . sertraline (ZOLOFT) 50 MG tablet Take 50 mg by mouth daily.   No facility-administered encounter medications on file as of  10/26/2016.     Allergy:  Allergies  Allergen Reactions  . Ativan [Lorazepam]     "HALLUCINATIONS"  . Flagyl [Metronidazole] Rash    Social Hx:   Social History   Social History  . Marital status: Widowed    Spouse name: N/A  . Number of children: 1  . Years of education: N/A   Occupational History  . Not on file.   Social History Main Topics  . Smoking status: Never Smoker  . Smokeless tobacco: Never Used  . Alcohol use No     Comment: occasional  . Drug use: No  . Sexual activity: No   Other Topics Concern  . Not on file   Social History Narrative  . No narrative on file    Past Surgical Hx:  Past Surgical History:  Procedure Laterality Date  . APPENDECTOMY  1954  . CATARACT EXTRACTION W/ INTRAOCULAR LENS  IMPLANT, BILATERAL  1994  . colonscopy    . DILATION AND CURETTAGE OF UTERUS N/A 11/20/2015   Procedure: DILATATION AND CURETTAGE;  Surgeon: Everitt Amber, MD;  Location: [facility];  Service: Gynecology;  Laterality: N/A;  .  DILATION AND CURETTAGE, DIAGNOSTIC / THERAPEUTIC N/A novmber 2017   per daughter with biopsy as stated  . KNEE ARTHROSCOPY Right 2002  . MASTECTOMY Left 1998   w/ lympn node dissection's  . ROBOTIC ASSISTED TOTAL HYSTERECTOMY WITH BILATERAL SALPINGO OOPHERECTOMY Bilateral 01/27/2016   Procedure: XI ROBOTIC ASSISTED TOTAL HYSTERECTOMY WITH BILATERAL SALPINGO OOPHORECTOMY;  Surgeon: Everitt Amber, MD;  Location: [facility];  Service: Gynecology;  Laterality: Bilateral;    Past Medical Hx:  Past Medical History:  Diagnosis Date  . Atrial fibrillation ([place])   . Bruises easily   . Cancer ([place]) 1998   left tamoxifem x 5 yrs  . Coronary artery disease    CARDIOLOGIST-  DR Geraldo Pitter ([place] [facility])  . Endometrial cancer ([place])   . Endometrial cancer ([place])   . GERD (gastroesophageal reflux disease)   . Glaucoma, both eyes   . History of breast cancer    1998--  s/p  left mastectomy with sln dissection's  ,  no chemo  or radiation-  no recurrence  . History of Clostridium difficile infection 5-6 yrs ago  . History of TIAs 12/22/2015   yrs ago ? AND QUESTIONABLE ONE YRS AGO  . Hyperlipidemia   . Hypertension   . Macular degeneration   . Narrow complex tachycardia [facility])    cardiologist-  dr Salley Scarlet  . OA (osteoarthritis)    right knee  . PMB (postmenopausal bleeding)   . PONV (postoperative nausea and vomiting)    ponv after appendectomy 1954, DID WELL AFTER D AND C RECENT  . Thickened endometrium   . Wears glasses     Past Gynecological History:  G1 (SVD) No LMP recorded. Patient is postmenopausal.  Family Hx:  Family History  Problem Relation Age of Onset  . Hypertension Mother   . Hypertension Father   . Liver cancer Brother   . Colon cancer Brother     Review of Systems:  Constitutional  Feels well,    ENT Normal appearing ears and nares bilaterally Skin/Breast  No rash, sores, jaundice, itching, dryness Cardiovascular  No chest pain, shortness of breath, or edema  Pulmonary  No cough or wheeze.  Gastro Intestinal  No nausea, vomitting, or diarrhoea. No bright red blood per rectum, no abdominal pain, change in bowel movement, or constipation.  Genito Urinary  No frequency, urgency, dysuria, + postmenopausal bleeding Musculo Skeletal  No myalgia, arthralgia, joint swelling or pain  Neurologic  No weakness, numbness, change in gait,  Psychology  No depression, anxiety, insomnia.   Vitals:  Blood pressure (!) 186/91, pulse (!) 58, temperature 98.5 F (36.9 C), temperature source Oral, resp. rate 18, weight 128 lb 6.4 oz (58.2 kg), SpO2 100 %.  Physical Exam: WD in NAD Neck  Supple NROM, without any enlargements.  Lymph Node Survey No cervical supraclavicular or inguinal adenopathy Cardiovascular  Pulse normal rate, regularity and rhythm. S1 and S2 normal.  Lungs  Clear to auscultation bilateraly, without wheezes/crackles/rhonchi. Good air movement.  Skin  No  rash/lesions/breakdown  Psychiatry  Alert and oriented to person, place, and time  Abdomen  Normoactive bowel sounds, abdomen soft, non-tender and thin without evidence of hernia.  Back No CVA tenderness Genito Urinary  Normal external genitalia. Residual erythema from prior recurrence site at right vaginal fornix. There is no gross residual tumor within the vagina. Bimanual rectovaginal exam reveals a mobile fullness that is most consistent with stool burden in redundant colonic folds. Rectal  deferred Extremities  No bilateral cyanosis, clubbing  or edema.  [name], MD  10/26/2016, 1:34 PM

## 2016-11-15 DIAGNOSIS — R69 Illness, unspecified: Secondary | ICD-10-CM | POA: Diagnosis not present

## 2016-12-13 ENCOUNTER — Telehealth: Payer: Self-pay | Admitting: *Deleted

## 2016-12-13 NOTE — Telephone Encounter (Signed)
Returned the patients daughter's call and scheduled a follow up appt for Januray.

## 2016-12-16 DIAGNOSIS — M172 Bilateral post-traumatic osteoarthritis of knee: Secondary | ICD-10-CM | POA: Diagnosis not present

## 2017-02-09 DIAGNOSIS — I10 Essential (primary) hypertension: Secondary | ICD-10-CM | POA: Diagnosis not present

## 2017-02-09 DIAGNOSIS — R69 Illness, unspecified: Secondary | ICD-10-CM | POA: Diagnosis not present

## 2017-02-09 DIAGNOSIS — Z79899 Other long term (current) drug therapy: Secondary | ICD-10-CM | POA: Diagnosis not present

## 2017-02-09 DIAGNOSIS — I4891 Unspecified atrial fibrillation: Secondary | ICD-10-CM | POA: Diagnosis not present

## 2017-02-09 DIAGNOSIS — M810 Age-related osteoporosis without current pathological fracture: Secondary | ICD-10-CM | POA: Diagnosis not present

## 2017-02-09 DIAGNOSIS — Z0000 Encounter for general adult medical examination without abnormal findings: Secondary | ICD-10-CM | POA: Diagnosis not present

## 2017-02-09 DIAGNOSIS — Z6821 Body mass index (BMI) 21.0-21.9, adult: Secondary | ICD-10-CM | POA: Diagnosis not present

## 2017-02-09 DIAGNOSIS — E559 Vitamin D deficiency, unspecified: Secondary | ICD-10-CM | POA: Diagnosis not present

## 2017-02-09 DIAGNOSIS — D519 Vitamin B12 deficiency anemia, unspecified: Secondary | ICD-10-CM | POA: Diagnosis not present

## 2017-02-09 DIAGNOSIS — R5383 Other fatigue: Secondary | ICD-10-CM | POA: Diagnosis not present

## 2017-02-14 ENCOUNTER — Inpatient Hospital Stay: Payer: Medicare HMO | Attending: Gynecologic Oncology | Admitting: Gynecologic Oncology

## 2017-02-14 ENCOUNTER — Encounter: Payer: Self-pay | Admitting: Gynecologic Oncology

## 2017-02-14 VITALS — BP 164/75 | HR 60 | Temp 98.0°F | Resp 20 | Ht 64.0 in | Wt 131.5 lb

## 2017-02-14 DIAGNOSIS — Z853 Personal history of malignant neoplasm of breast: Secondary | ICD-10-CM | POA: Diagnosis not present

## 2017-02-14 DIAGNOSIS — I4891 Unspecified atrial fibrillation: Secondary | ICD-10-CM | POA: Diagnosis not present

## 2017-02-14 DIAGNOSIS — Z8673 Personal history of transient ischemic attack (TIA), and cerebral infarction without residual deficits: Secondary | ICD-10-CM

## 2017-02-14 DIAGNOSIS — E785 Hyperlipidemia, unspecified: Secondary | ICD-10-CM | POA: Diagnosis not present

## 2017-02-14 DIAGNOSIS — M1990 Unspecified osteoarthritis, unspecified site: Secondary | ICD-10-CM | POA: Insufficient documentation

## 2017-02-14 DIAGNOSIS — K219 Gastro-esophageal reflux disease without esophagitis: Secondary | ICD-10-CM | POA: Diagnosis not present

## 2017-02-14 DIAGNOSIS — Z7982 Long term (current) use of aspirin: Secondary | ICD-10-CM | POA: Insufficient documentation

## 2017-02-14 DIAGNOSIS — Z923 Personal history of irradiation: Secondary | ICD-10-CM | POA: Diagnosis not present

## 2017-02-14 DIAGNOSIS — I2510 Atherosclerotic heart disease of native coronary artery without angina pectoris: Secondary | ICD-10-CM | POA: Insufficient documentation

## 2017-02-14 DIAGNOSIS — Z90722 Acquired absence of ovaries, bilateral: Secondary | ICD-10-CM

## 2017-02-14 DIAGNOSIS — I10 Essential (primary) hypertension: Secondary | ICD-10-CM | POA: Diagnosis not present

## 2017-02-14 DIAGNOSIS — C541 Malignant neoplasm of endometrium: Secondary | ICD-10-CM | POA: Diagnosis not present

## 2017-02-14 DIAGNOSIS — Z90710 Acquired absence of both cervix and uterus: Secondary | ICD-10-CM

## 2017-02-14 DIAGNOSIS — Z79899 Other long term (current) drug therapy: Secondary | ICD-10-CM | POA: Diagnosis not present

## 2017-02-14 NOTE — Patient Instructions (Signed)
Please notify Dr Denman George at phone number [phone] if you notice vaginal bleeding, new pelvic or abdominal pains, bloating, feeling full easy, or a change in bladder or bowel function.   Please return to see Dr Denman George as scheduled in April, 2019.

## 2017-02-14 NOTE — Progress Notes (Signed)
Follow-up Note: Gyn-Onc  Consult was initially requested by Dr. Berenice Primas for the evaluation of Sheri Hensley 84 y.o. female  CC:  Chief Complaint  Patient presents with  . Endometrial cancer [facility])    Assessment/Plan:  [name]  is a 84 y.o.  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  Follow-up in 3 months for surveillance exam  HPI: Sheri Hensley is a 84 year old woman (G1P1) who is seen in consultation at the request of Dr Berenice Primas due to post-menopausal bleeding.  The patient has a history of postmenopausal bleeding since early September 2017. It was initially light spotting, then became slightly more persistent over time.  She was seen by Dr Berenice Primas on 10/20/15 where a TVUS was performed and demonstrated a uterus measuring 6.25x5.2x4.19cm but with a thickened endometrium of 2cm. The notes document that an office biopsy was not possible due to narrowed vagina, an Dr Berenice Primas did not think she would be able to accomplish a D&C in the operating room.  The patient is relatively healthy for her advanced age. She has a history of an arrythmia for which she takes amiodarone. She also takes Plavix for a presumed TIA. She otherwise has a medical history significant for a prior appendectomy and 1 prior vaginal delivery.   On 11/20/15 she underwent D&C and pathology revealed grade 2 endomettrioid endometrial adenocarcinoma.  On the morning of her planned surgical date on 12/23/15 she was diagnosed with a TIA.  She has resolved all symptoms (right hand weakness). She was restarted on Plavix and baby ASA.  Surgery was rescheduled for 01/27/16 and a robotic assisted total hysterectomy and BSO was performed. Upper vaginal laceration was incurred due to size of specimen and vaginal retrieval.   Final pathology revealed a 5.1cm grade 2 tumor with deep (1.8 of  1.8cm myometrial invasion with LVSI identified). No cervical or ovarian involvement was appreciated. In accordance with NCCN guidelines adjuvant radiation was recommended.  She was extensively counseled regarding the indication for radiation and goals of therapy. She met with Dr Sondra Come however declined brachytherapy because she did not want "that thing up in me".   She began experiencing vaginal spotting in April, 2018 and this became heavier in the first week of May, 2018. Biopsy on 05/24/16 revealed recurrent endometrial cancer.  CT scan failed to show significant pelvic extension.  She was recommended to have external beam radiation and vaginal brachytherapy for salvage therapy.  She saw Dr Sondra Come for consultation in May, 2018. However, she elected to receive radiation with Dr Orlene Erm at [place] due to where she lives and difficulty making it to [place] for daily treatments. The patient decided that she did not wish to have vaginal brachytherapy as she was concerned about the discomfort of the intracavitary device. Therefore she exclusively received external beam therapy. She received treatment between 06/28/16 through 08/13/16. She received 5040 cGyn in 28 fractions to the lower pelvis with a 1080 boost in 6 fractions to the paravaginal tissues in lieu of vaginal intracavitary brachytherapy.  Interval Hx: Since completing therapy she notes no bleeding. However, she does have intermittent lower abdominal pains and diarrhea.  Exam on September 21st, 2018 was concerning for a pelvic mass. CT abdo/pelvis on 10/12/16 showed no mass, however there was significant stool burden in the colon.  She has no symptoms concerning for recurrence   Current Meds:  Outpatient Encounter Medications as of 02/14/2017  Medication Sig  . acetaminophen (TYLENOL) 500 MG tablet Take 500 mg by mouth every 6 (six) hours as needed for moderate pain or headache.  . ALPRAZolam (XANAX) 0.25 MG tablet Take 0.125-0.25  mg by mouth daily as needed for anxiety.   Marland Kitchen amiodarone (PACERONE) 200 MG tablet Take 0.5 tablets (100 mg total) by mouth every morning.  Marland Kitchen atorvastatin (LIPITOR) 10 MG tablet Take 10 mg by mouth at bedtime.  . betamethasone acetate-betamethasone sodium phosphate (CELESTONE) 6 (3-3) MG/ML injection Inject 12 mg into the articular space every 3 (three) months. Next injection due end of January  . bimatoprost (LUMIGAN) 0.01 % SOLN Place 1 drop into both eyes at bedtime.  . brimonidine (ALPHAGAN P) 0.1 % SOLN Place 1 drop into both eyes every 8 (eight) hours.   . clopidogrel (PLAVIX) 75 MG tablet Take 1 tablet (75 mg total) by mouth daily. (Patient taking differently: Take 75 mg by mouth daily. Will stop prior to surgery on 01-17-16 and will start 81 mg Aspirin)  . cyanocobalamin (V-R VITAMIN B-12) 500 MCG tablet Take 500 mcg by mouth daily.   Marland Kitchen docusate sodium (COLACE) 100 MG capsule Take 100 mg by mouth 2 (two) times daily.  Marland Kitchen ibuprofen (ADVIL,MOTRIN) 600 MG tablet Take 1 tablet (600 mg total) by mouth every 6 (six) hours as needed (mild pain).  Marland Kitchen lisinopril (PRINIVIL,ZESTRIL) 10 MG tablet Take 10 mg by mouth every morning.   . Melatonin 10 MG CAPS Take 10 mg by mouth at bedtime.   . metoprolol tartrate (LOPRESSOR) 25 MG tablet Take 12.5 mg by mouth 2 (two) times daily.   . Multiple Vitamins-Minerals (CENTRUM VITAMINTS PO) Take 1 tablet by mouth every morning.  . Multiple Vitamins-Minerals (ICAPS AREDS 2) CAPS Take 1 tablet by mouth 2 (two) times daily.   Marland Kitchen omeprazole (PRILOSEC) 20 MG capsule Take 20 mg by mouth every morning.  . sertraline (ZOLOFT) 50 MG tablet Take 50 mg by mouth daily.  Marland Kitchen alendronate (FOSAMAX) 70 MG tablet   . loratadine (CLARITIN) 10 MG tablet Take 10 mg by mouth daily as needed for allergies.  . Vitamin D, Ergocalciferol, (DRISDOL) 50000 units CAPS capsule   . [DISCONTINUED] aspirin EC 81 MG EC tablet Take 1 tablet (81 mg total) by mouth daily.  . [DISCONTINUED] senna  (SENOKOT) 8.6 MG TABS tablet Take 1 tablet (8.6 mg total) by mouth at bedtime.   No facility-administered encounter medications on file as of 02/14/2017.     Allergy:  Allergies  Allergen Reactions  . Ativan [Lorazepam]     "HALLUCINATIONS"  . Flagyl [Metronidazole] Rash    Social Hx:   Social History   Socioeconomic History  . Marital status: Widowed    Spouse name: Not on file  . Number of children: 1  . Years of education: Not on file  . Highest education level: Not on file  Social Needs  . Financial resource strain: Not on file  . Food insecurity - worry: Not on file  . Food insecurity - inability: Not on file  . Transportation needs - medical: Not on file  . Transportation needs - non-medical: Not on file  Occupational History  . Not on file  Tobacco Use  . Smoking status: Never Smoker  . Smokeless tobacco: Never Used  Substance and Sexual Activity  . Alcohol use: No    Comment: occasional  . Drug use: No  . Sexual activity: No  Other Topics Concern  . Not on file  Social  History Narrative  . Not on file    Past Surgical Hx:  Past Surgical History:  Procedure Laterality Date  . APPENDECTOMY  1954  . CATARACT EXTRACTION W/ INTRAOCULAR LENS  IMPLANT, BILATERAL  1994  . colonscopy    . DILATION AND CURETTAGE OF UTERUS N/A 11/20/2015   Procedure: DILATATION AND CURETTAGE;  Surgeon: Everitt Amber, MD;  [facility];  Service: Gynecology;  Laterality: N/A;  . DILATION AND CURETTAGE, DIAGNOSTIC / THERAPEUTIC N/A novmber 2017   per daughter with biopsy as stated  . KNEE ARTHROSCOPY Right 2002  . MASTECTOMY Left 1998   w/ lympn node dissection's  . ROBOTIC ASSISTED TOTAL HYSTERECTOMY WITH BILATERAL SALPINGO OOPHERECTOMY Bilateral 01/27/2016   Procedure: XI ROBOTIC ASSISTED TOTAL HYSTERECTOMY WITH BILATERAL SALPINGO OOPHORECTOMY;  Surgeon: Everitt Amber, MD;  Location: [facility];  Service: Gynecology;  Laterality: Bilateral;    Past Medical Hx:   Past Medical History:  Diagnosis Date  . Atrial fibrillation ([place])   . Bruises easily   . Cancer ([place]) 1998   left tamoxifem x 5 yrs  . Coronary artery disease    CARDIOLOGIST-  DR Geraldo Pitter ([place] [facility])  . Endometrial cancer ([place])   . Endometrial cancer ([place])   . GERD (gastroesophageal reflux disease)   . Glaucoma, both eyes   . History of breast cancer    1998--  s/p  left mastectomy with sln dissection's  ,  no chemo or radiation-  no recurrence  . History of Clostridium difficile infection 5-6 yrs ago  . History of TIAs 12/22/2015   yrs ago ? AND QUESTIONABLE ONE YRS AGO  . Hyperlipidemia   . Hypertension   . Macular degeneration   . Narrow complex tachycardia [facility])    cardiologist-  dr Salley Scarlet  . OA (osteoarthritis)    right knee  . PMB (postmenopausal bleeding)   . PONV (postoperative nausea and vomiting)    ponv after appendectomy 1954, DID WELL AFTER D AND C RECENT  . Thickened endometrium   . Wears glasses     Past Gynecological History:  G1 (SVD) No LMP recorded. Patient is postmenopausal.  Family Hx:  Family History  Problem Relation Age of Onset  . Hypertension Mother   . Hypertension Father   . Liver cancer Brother   . Colon cancer Brother     Review of Systems:  Constitutional  Feels well,    ENT Normal appearing ears and nares bilaterally Skin/Breast  No rash, sores, jaundice, itching, dryness Cardiovascular  No chest pain, shortness of breath, or edema  Pulmonary  No cough or wheeze.  Gastro Intestinal  No nausea, vomitting, or diarrhoea. No bright red blood per rectum, no abdominal pain, change in bowel movement, or constipation.  Genito Urinary  No frequency, urgency, dysuria, no postmenopausal bleeding Musculo Skeletal  No myalgia, arthralgia, joint swelling or pain  Neurologic  No weakness, numbness, change in gait,  Psychology  No depression, anxiety, insomnia.   Vitals:  Blood pressure (!) 164/75, pulse 60,  temperature 98 F (36.7 C), temperature source Oral, resp. rate 20, height 5' 4" (1.626 m), weight 131 lb 8 oz (59.6 kg), SpO2 98 %.  Physical Exam: WD in NAD Neck  Supple NROM, without any enlargements.  Lymph Node Survey No cervical supraclavicular or inguinal adenopathy Cardiovascular  Pulse normal rate, regularity and rhythm. S1 and S2 normal.  Lungs  Clear to auscultation bilateraly, without wheezes/crackles/rhonchi. Good air movement.  Skin  No rash/lesions/breakdown  Psychiatry  Alert and oriented to person, place, and time  Abdomen  Normoactive bowel sounds, abdomen soft, non-tender and thin without evidence of hernia.  Back No CVA tenderness Genito Urinary  Normal external genitalia. Residual erythema from prior recurrence site at right vaginal fornix. There is no gross residual tumor within the vagina. Bimanual rectovaginal exam reveals no masses. Rectal  No masses, normal mucosa Extremities  No bilateral cyanosis, clubbing or edema.  [name], MD  02/14/2017, 1:32 PM

## 2017-03-29 ENCOUNTER — Other Ambulatory Visit: Payer: Self-pay

## 2017-03-29 ENCOUNTER — Telehealth: Payer: Self-pay | Admitting: Cardiology

## 2017-03-29 DIAGNOSIS — H5213 Myopia, bilateral: Secondary | ICD-10-CM | POA: Diagnosis not present

## 2017-03-29 DIAGNOSIS — H401131 Primary open-angle glaucoma, bilateral, mild stage: Secondary | ICD-10-CM | POA: Diagnosis not present

## 2017-03-29 DIAGNOSIS — H524 Presbyopia: Secondary | ICD-10-CM | POA: Diagnosis not present

## 2017-03-29 DIAGNOSIS — Z961 Presence of intraocular lens: Secondary | ICD-10-CM | POA: Diagnosis not present

## 2017-03-29 MED ORDER — ATORVASTATIN CALCIUM 10 MG PO TABS: 10.0000 mg | ORAL_TABLET | Freq: Every day | ORAL | 1 refills | Status: DC

## 2017-03-29 NOTE — Telephone Encounter (Signed)
Needs refill for Lipitor

## 2017-03-29 NOTE — Telephone Encounter (Signed)
Refill sent.

## 2017-05-02 DIAGNOSIS — M170 Bilateral primary osteoarthritis of knee: Secondary | ICD-10-CM | POA: Diagnosis not present

## 2017-05-10 ENCOUNTER — Inpatient Hospital Stay: Payer: Medicare HMO | Attending: Gynecologic Oncology | Admitting: Gynecologic Oncology

## 2017-05-10 ENCOUNTER — Encounter: Payer: Self-pay | Admitting: Gynecologic Oncology

## 2017-05-10 VITALS — BP 174/72 | HR 60 | Temp 97.6°F | Resp 20 | Ht 64.0 in | Wt 133.3 lb

## 2017-05-10 DIAGNOSIS — Z90722 Acquired absence of ovaries, bilateral: Secondary | ICD-10-CM | POA: Diagnosis not present

## 2017-05-10 DIAGNOSIS — Z90710 Acquired absence of both cervix and uterus: Secondary | ICD-10-CM | POA: Diagnosis not present

## 2017-05-10 DIAGNOSIS — C541 Malignant neoplasm of endometrium: Secondary | ICD-10-CM | POA: Insufficient documentation

## 2017-05-10 DIAGNOSIS — Z923 Personal history of irradiation: Secondary | ICD-10-CM | POA: Diagnosis not present

## 2017-05-10 NOTE — Progress Notes (Signed)
Follow-up Note: Gyn-Onc  Consult was initially requested by Dr. Berenice Primas for the evaluation of Sheri Hensley 84 y.o. female  CC:  Chief Complaint  Patient presents with  . Endometrial cancer [facility])    Assessment/Plan:  Sheri Hensley  is a 84 y.o.  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  Follow-up in 3 months for surveillance exam  HPI: Sheri Hensley is a 84 year old woman (G1P1) who is seen in consultation at the request of Dr Berenice Primas due to post-menopausal bleeding.  The patient has a history of postmenopausal bleeding since early September 2017. It was initially light spotting, then became slightly more persistent over time.  She was seen by Dr Berenice Primas on 10/20/15 where a TVUS was performed and demonstrated a uterus measuring 6.25x5.2x4.19cm but with a thickened endometrium of 2cm. The notes document that an office biopsy was not possible due to narrowed vagina, an Dr Berenice Primas did not think she would be able to accomplish a D&C in the operating room.  The patient is relatively healthy for her advanced age. She has a history of an arrythmia for which she takes amiodarone. She also takes Plavix for a presumed TIA. She otherwise has a medical history significant for a prior appendectomy and 1 prior vaginal delivery.   On 11/20/15 she underwent D&C and pathology revealed grade 2 endomettrioid endometrial adenocarcinoma.  On the morning of her planned surgical date on 12/23/15 she was diagnosed with a TIA.  She has resolved all symptoms (right hand weakness). She was restarted on Plavix and baby ASA.  Surgery was rescheduled for 01/27/16 and a robotic assisted total hysterectomy and BSO was performed. Upper vaginal laceration was incurred due to size of specimen and vaginal retrieval.   Final pathology revealed a 5.1cm grade 2 tumor with deep (1.8 of  1.8cm myometrial invasion with LVSI identified). No cervical or ovarian involvement was appreciated. In accordance with NCCN guidelines adjuvant radiation was recommended.  She was extensively counseled regarding the indication for radiation and goals of therapy. She met with Dr Sondra Come however declined brachytherapy because she did not want "that thing up in me".   She began experiencing vaginal spotting in April, 2018 and this became heavier in the first week of May, 2018. Biopsy on 05/24/16 revealed recurrent endometrial cancer.  CT scan failed to show significant pelvic extension.  She was recommended to have external beam radiation and vaginal brachytherapy for salvage therapy.  She saw Dr Sondra Come for consultation in May, 2018. However, she elected to receive radiation with Dr Orlene Erm at [place] due to where she lives and difficulty making it to [place] for daily treatments. The patient decided that she did not wish to have vaginal brachytherapy as she was concerned about the discomfort of the intracavitary device. Therefore she exclusively received external beam therapy. She received treatment between 06/28/16 through 08/13/16. She received 5040 cGyn in 28 fractions to the lower pelvis with a 1080 boost in 6 fractions to the paravaginal tissues in lieu of vaginal intracavitary brachytherapy.  Interval Hx: Since completing therapy she notes no bleeding. However, she does have intermittent lower abdominal pains and diarrhea.  Exam on September 21st, 2018 was concerning for a pelvic mass. CT abdo/pelvis on 10/12/16 showed no mass, however there was significant stool burden in the colon.  She has no symptoms concerning for recurrence   Current Meds:  Outpatient Encounter Medications as of 05/10/2017  Medication Sig  . acetaminophen (TYLENOL) 500 MG tablet Take 500 mg by mouth every 6 (six) hours as needed for moderate pain or headache.  . alendronate (FOSAMAX) 70 MG tablet once a week.    . ALPRAZolam (XANAX) 0.25 MG tablet Take 0.125-0.25 mg by mouth daily as needed for anxiety.   Marland Kitchen amiodarone (PACERONE) 200 MG tablet Take 0.5 tablets (100 mg total) by mouth every morning.  Marland Kitchen atorvastatin (LIPITOR) 10 MG tablet Take 1 tablet (10 mg total) by mouth at bedtime.  . betamethasone acetate-betamethasone sodium phosphate (CELESTONE) 6 (3-3) MG/ML injection Inject 12 mg into the articular space every 3 (three) months. Next injection due end of January  . bimatoprost (LUMIGAN) 0.01 % SOLN Place 1 drop into both eyes at bedtime.  . brimonidine (ALPHAGAN P) 0.1 % SOLN Place 1 drop into both eyes every 8 (eight) hours.   . clopidogrel (PLAVIX) 75 MG tablet Take 1 tablet (75 mg total) by mouth daily. (Patient taking differently: Take 75 mg by mouth daily. Will stop prior to surgery on 01-17-16 and will start 81 mg Aspirin)  . cyanocobalamin (V-R VITAMIN B-12) 500 MCG tablet Take 500 mcg by mouth daily.   Marland Kitchen docusate sodium (COLACE) 100 MG capsule Take 100 mg by mouth 2 (two) times daily.  Marland Kitchen ibuprofen (ADVIL,MOTRIN) 600 MG tablet Take 1 tablet (600 mg total) by mouth every 6 (six) hours as needed (mild pain).  Marland Kitchen lisinopril (PRINIVIL,ZESTRIL) 10 MG tablet Take 10 mg by mouth every morning.   . loratadine (CLARITIN) 10 MG tablet Take 10 mg by mouth daily as needed for allergies.  . Melatonin 10 MG CAPS Take 10 mg by mouth at bedtime.   . metoprolol tartrate (LOPRESSOR) 25 MG tablet Take 12.5 mg by mouth 2 (two) times daily.   . Multiple Vitamins-Minerals (CENTRUM VITAMINTS PO) Take 1 tablet by mouth every morning.  . Multiple Vitamins-Minerals (ICAPS AREDS 2) CAPS Take 1 tablet by mouth 2 (two) times daily.   Marland Kitchen omeprazole (PRILOSEC) 20 MG capsule Take 20 mg by mouth every morning.  . sertraline (ZOLOFT) 50 MG tablet Take 50 mg by mouth daily.  . Vitamin D, Ergocalciferol, (DRISDOL) 50000 units CAPS capsule Twice a week   No facility-administered encounter medications on file as of 05/10/2017.      Allergy:  Allergies  Allergen Reactions  . Ativan [Lorazepam]     "HALLUCINATIONS"  . Flagyl [Metronidazole] Rash    Social Hx:   Social History   Socioeconomic History  . Marital status: Widowed    Spouse name: Not on file  . Number of children: 1  . Years of education: Not on file  . Highest education level: Not on file  Occupational History  . Not on file  Social Needs  . Financial resource strain: Not on file  . Food insecurity:    Worry: Not on file    Inability: Not on file  . Transportation needs:    Medical: Not on file    Non-medical: Not on file  Tobacco Use  . Smoking status: Never Smoker  . Smokeless tobacco: Never Used  Substance and Sexual Activity  . Alcohol use: No    Comment: occasional  . Drug use: No  . Sexual activity: Never  Lifestyle  . Physical activity:    Days per week: Not on file    Minutes per session: Not on file  . Stress: Not on file  Relationships  . Social connections:    Talks  on phone: Not on file    Gets together: Not on file    Attends religious service: Not on file    Active member of club or organization: Not on file    Attends meetings of clubs or organizations: Not on file    Relationship status: Not on file  . Intimate partner violence:    Fear of current or ex partner: Not on file    Emotionally abused: Not on file    Physically abused: Not on file    Forced sexual activity: Not on file  Other Topics Concern  . Not on file  Social History Narrative  . Not on file    Past Surgical Hx:  Past Surgical History:  Procedure Laterality Date  . APPENDECTOMY  1954  . CATARACT EXTRACTION W/ INTRAOCULAR LENS  IMPLANT, BILATERAL  1994  . colonscopy    . DILATION AND CURETTAGE OF UTERUS N/A 11/20/2015   Procedure: DILATATION AND CURETTAGE;  Surgeon: Everitt Amber, MD;  Location[facility];  Service: Gynecology;  Laterality: N/A;  . DILATION AND CURETTAGE, DIAGNOSTIC / THERAPEUTIC N/A novmber 2017    per daughter with biopsy as stated  . KNEE ARTHROSCOPY Right 2002  . MASTECTOMY Left 1998   w/ lympn node dissection's  . ROBOTIC ASSISTED TOTAL HYSTERECTOMY WITH BILATERAL SALPINGO OOPHERECTOMY Bilateral 01/27/2016   Procedure: XI ROBOTIC ASSISTED TOTAL HYSTERECTOMY WITH BILATERAL SALPINGO OOPHORECTOMY;  Surgeon: Everitt Amber, MD;  Location: [facility];  Service: Gynecology;  Laterality: Bilateral;    Past Medical Hx:  Past Medical History:  Diagnosis Date  . Atrial fibrillation ([place])   . Bruises easily   . Cancer ([place]) 1998   left tamoxifem x 5 yrs  . Coronary artery disease    CARDIOLOGIST-  DR Geraldo Pitter ([facility])  . Endometrial cancer ([place])   . Endometrial cancer ([place])   . GERD (gastroesophageal reflux disease)   . Glaucoma, both eyes   . History of breast cancer    1998--  s/p  left mastectomy with sln dissection's  ,  no chemo or radiation-  no recurrence  . History of Clostridium difficile infection 5-6 yrs ago  . History of TIAs 12/22/2015   yrs ago ? AND QUESTIONABLE ONE YRS AGO  . Hyperlipidemia   . Hypertension   . Macular degeneration   . Narrow complex tachycardia [facility])    cardiologist-  dr Salley Scarlet  . OA (osteoarthritis)    right knee  . PMB (postmenopausal bleeding)   . PONV (postoperative nausea and vomiting)    ponv after appendectomy 1954, DID WELL AFTER D AND C RECENT  . Thickened endometrium   . Wears glasses     Past Gynecological History:  G1 (SVD) No LMP recorded. Patient is postmenopausal.  Family Hx:  Family History  Problem Relation Age of Onset  . Hypertension Mother   . Hypertension Father   . Liver cancer Brother   . Colon cancer Brother     Review of Systems:  Constitutional  Feels well,    ENT Normal appearing ears and nares bilaterally Skin/Breast  No rash, sores, jaundice, itching, dryness Cardiovascular  No chest pain, shortness of breath, or edema  Pulmonary  No cough or wheeze.  Gastro Intestinal  No  nausea, vomitting, or diarrhoea. No bright red blood per rectum, no abdominal pain, change in bowel movement, or constipation.  Genito Urinary  No frequency, urgency, dysuria, no postmenopausal bleeding Musculo Skeletal  No myalgia, arthralgia, joint swelling or  pain  Neurologic  No weakness, numbness, change in gait,  Psychology  No depression, anxiety, insomnia.   Vitals:  Blood pressure (!) 174/72, pulse 60, temperature 97.6 F (36.4 C), temperature source Oral, resp. rate 20, height '5\' 4"'  (1.626 m), weight 133 lb 4.8 oz (60.5 kg), SpO2 100 %.  Physical Exam: WD in NAD Neck  Supple NROM, without any enlargements.  Lymph Node Survey No cervical supraclavicular or inguinal adenopathy Cardiovascular  Pulse normal rate, regularity and rhythm. S1 and S2 normal.  Lungs  Clear to auscultation bilateraly, without wheezes/crackles/rhonchi. Good air movement.  Skin  No rash/lesions/breakdown  Psychiatry  Alert and oriented to person, place, and time  Abdomen  Normoactive bowel sounds, abdomen soft, non-tender and thin without evidence of hernia.  Back No CVA tenderness Genito Urinary  Normal external genitalia. Residual erythema from prior recurrence site at right vaginal fornix. There is no gross residual tumor within the vagina. Bimanual rectovaginal exam reveals no masses. Rectal  No masses, normal mucosa Extremities  No bilateral cyanosis, clubbing or edema.  Thereasa Solo, MD  05/10/2017, 2:13 PM

## 2017-05-10 NOTE — Patient Instructions (Signed)
Please notify Dr Denman George at phone number [phone] if you notice vaginal bleeding, new pelvic or abdominal pains, bloating, feeling full easy, or a change in bladder or bowel function.   Please return to see Dr Denman George in 3 months.  Try gas-x for relief of abdominal gas symptoms.

## 2017-06-18 DIAGNOSIS — W19XXXA Unspecified fall, initial encounter: Secondary | ICD-10-CM

## 2017-06-18 DIAGNOSIS — R296 Repeated falls: Secondary | ICD-10-CM | POA: Insufficient documentation

## 2017-06-18 HISTORY — DX: Unspecified fall, initial encounter: W19.XXXA

## 2017-06-18 HISTORY — DX: Repeated falls: R29.6

## 2017-06-30 DIAGNOSIS — R58 Hemorrhage, not elsewhere classified: Secondary | ICD-10-CM | POA: Diagnosis not present

## 2017-06-30 DIAGNOSIS — S199XXA Unspecified injury of neck, initial encounter: Secondary | ICD-10-CM | POA: Diagnosis not present

## 2017-06-30 DIAGNOSIS — Z79899 Other long term (current) drug therapy: Secondary | ICD-10-CM | POA: Diagnosis not present

## 2017-06-30 DIAGNOSIS — S0003XA Contusion of scalp, initial encounter: Secondary | ICD-10-CM | POA: Diagnosis not present

## 2017-06-30 DIAGNOSIS — W19XXXA Unspecified fall, initial encounter: Secondary | ICD-10-CM | POA: Diagnosis not present

## 2017-06-30 DIAGNOSIS — I10 Essential (primary) hypertension: Secondary | ICD-10-CM | POA: Diagnosis not present

## 2017-06-30 DIAGNOSIS — S0101XA Laceration without foreign body of scalp, initial encounter: Secondary | ICD-10-CM | POA: Diagnosis not present

## 2017-07-01 DIAGNOSIS — S199XXA Unspecified injury of neck, initial encounter: Secondary | ICD-10-CM | POA: Diagnosis not present

## 2017-07-01 DIAGNOSIS — S0003XA Contusion of scalp, initial encounter: Secondary | ICD-10-CM | POA: Diagnosis not present

## 2017-08-02 DIAGNOSIS — H5213 Myopia, bilateral: Secondary | ICD-10-CM | POA: Diagnosis not present

## 2017-08-02 DIAGNOSIS — H524 Presbyopia: Secondary | ICD-10-CM | POA: Diagnosis not present

## 2017-08-02 DIAGNOSIS — H401131 Primary open-angle glaucoma, bilateral, mild stage: Secondary | ICD-10-CM | POA: Diagnosis not present

## 2017-08-02 DIAGNOSIS — Z961 Presence of intraocular lens: Secondary | ICD-10-CM | POA: Diagnosis not present

## 2017-08-12 ENCOUNTER — Inpatient Hospital Stay: Payer: Medicare HMO | Attending: Gynecologic Oncology | Admitting: Gynecologic Oncology

## 2017-08-12 ENCOUNTER — Encounter: Payer: Self-pay | Admitting: Gynecologic Oncology

## 2017-08-12 VITALS — BP 187/57 | HR 63 | Temp 99.1°F | Resp 20 | Ht 64.0 in | Wt 132.3 lb

## 2017-08-12 DIAGNOSIS — C541 Malignant neoplasm of endometrium: Secondary | ICD-10-CM | POA: Diagnosis not present

## 2017-08-12 DIAGNOSIS — Z90710 Acquired absence of both cervix and uterus: Secondary | ICD-10-CM | POA: Diagnosis not present

## 2017-08-12 DIAGNOSIS — Z923 Personal history of irradiation: Secondary | ICD-10-CM | POA: Insufficient documentation

## 2017-08-12 DIAGNOSIS — Z90722 Acquired absence of ovaries, bilateral: Secondary | ICD-10-CM

## 2017-08-12 NOTE — Progress Notes (Signed)
Follow-up Note: Gyn-Onc  Consult was initially requested by Dr. Berenice Primas for the evaluation of [name] 84 y.o. female  CC:  Chief Complaint  Patient presents with  . Endometrial cancer [facility])    Assessment/Plan:  Ms. LAVETTA GEIER  is a 84 y.o.  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  Follow-up in 3 months for surveillance exam  HPI: Celie Desrochers is a 84 year old woman (G1P1) who is seen in consultation at the request of Dr Berenice Primas due to post-menopausal bleeding.  The patient has a history of postmenopausal bleeding since early September 2017. It was initially light spotting, then became slightly more persistent over time.  She was seen by Dr Berenice Primas on 10/20/15 where a TVUS was performed and demonstrated a uterus measuring 6.25x5.2x4.19cm but with a thickened endometrium of 2cm. The notes document that an office biopsy was not possible due to narrowed vagina, an Dr Berenice Primas did not think she would be able to accomplish a D&C in the operating room.  The patient is relatively healthy for her advanced age. She has a history of an arrythmia for which she takes amiodarone. She also takes Plavix for a presumed TIA. She otherwise has a medical history significant for a prior appendectomy and 1 prior vaginal delivery.   On 11/20/15 she underwent D&C and pathology revealed grade 2 endomettrioid endometrial adenocarcinoma.  On the morning of her planned surgical date on 12/23/15 she was diagnosed with a TIA.  She has resolved all symptoms (right hand weakness). She was restarted on Plavix and baby ASA.  Surgery was rescheduled for 01/27/16 and a robotic assisted total hysterectomy and BSO was performed. Upper vaginal laceration was incurred due to size of specimen and vaginal retrieval.   Final pathology revealed a 5.1cm grade 2 tumor with deep (1.8 of  1.8cm myometrial invasion with LVSI identified). No cervical or ovarian involvement was appreciated. In accordance with NCCN guidelines adjuvant radiation was recommended.  She was extensively counseled regarding the indication for radiation and goals of therapy. She met with Dr Sondra Come however declined brachytherapy because she did not want "that thing up in me".   She began experiencing vaginal spotting in April, 2018 and this became heavier in the first week of May, 2018. Biopsy on 05/24/16 revealed recurrent endometrial cancer.  CT scan failed to show significant pelvic extension.  She was recommended to have external beam radiation and vaginal brachytherapy for salvage therapy.  She saw Dr Sondra Come for consultation in May, 2018. However, she elected to receive radiation with Dr Orlene Erm at [place] due to where she lives and difficulty making it to [place] for daily treatments. The patient decided that she did not wish to have vaginal brachytherapy as she was concerned about the discomfort of the intracavitary device. Therefore she exclusively received external beam therapy. She received treatment between 06/28/16 through 08/13/16. She received 5040 cGyn in 28 fractions to the lower pelvis with a 1080 boost in 6 fractions to the paravaginal tissues in lieu of vaginal intracavitary brachytherapy.  Interval Hx: Since completing therapy she notes no bleeding. However, she does have intermittent lower abdominal pains and diarrhea.  Exam on September 21st, 2018 was concerning for a pelvic mass. CT abdo/pelvis on 10/12/16 showed no mass, however there was significant stool burden in the colon.  She has no symptoms concerning for recurrence   Current Meds:  Outpatient Encounter Medications as of 08/12/2017  Medication Sig  . acetaminophen (TYLENOL) 500 MG tablet Take 500 mg by mouth every 6 (six) hours as needed for moderate pain or headache.  . alendronate (FOSAMAX) 70 MG tablet once a week.    . ALPRAZolam (XANAX) 0.25 MG tablet Take 0.125-0.25 mg by mouth daily as needed for anxiety.   Marland Kitchen amiodarone (PACERONE) 200 MG tablet Take 0.5 tablets (100 mg total) by mouth every morning.  Marland Kitchen atorvastatin (LIPITOR) 10 MG tablet Take 1 tablet (10 mg total) by mouth at bedtime.  . betamethasone acetate-betamethasone sodium phosphate (CELESTONE) 6 (3-3) MG/ML injection Inject 12 mg into the articular space every 3 (three) months. Next injection due end of January  . bimatoprost (LUMIGAN) 0.01 % SOLN Place 1 drop into both eyes at bedtime.  . brimonidine (ALPHAGAN P) 0.1 % SOLN Place 1 drop into both eyes every 8 (eight) hours.   . clopidogrel (PLAVIX) 75 MG tablet Take 1 tablet (75 mg total) by mouth daily. (Patient taking differently: Take 75 mg by mouth daily. Will stop prior to surgery on 01-17-16 and will start 81 mg Aspirin)  . cyanocobalamin (V-R VITAMIN B-12) 500 MCG tablet Take 500 mcg by mouth daily.   Marland Kitchen ibuprofen (ADVIL,MOTRIN) 600 MG tablet Take 1 tablet (600 mg total) by mouth every 6 (six) hours as needed (mild pain).  Marland Kitchen lisinopril (PRINIVIL,ZESTRIL) 10 MG tablet Take 10 mg by mouth every morning.   . loratadine (CLARITIN) 10 MG tablet Take 10 mg by mouth daily as needed for allergies.  . Melatonin 10 MG CAPS Take 10 mg by mouth at bedtime.   . metoprolol tartrate (LOPRESSOR) 25 MG tablet Take 12.5 mg by mouth 2 (two) times daily.   . Multiple Vitamins-Minerals (CENTRUM VITAMINTS PO) Take 1 tablet by mouth every morning.  . Multiple Vitamins-Minerals (ICAPS AREDS 2) CAPS Take 1 tablet by mouth 2 (two) times daily.   . sertraline (ZOLOFT) 50 MG tablet Take 50 mg by mouth daily.  . Vitamin D, Ergocalciferol, (DRISDOL) 50000 units CAPS capsule as needed. Twice a week  . docusate sodium (COLACE) 100 MG capsule Take 100 mg by mouth 2 (two) times daily.  Marland Kitchen omeprazole (PRILOSEC) 20 MG capsule Take 20 mg by mouth every morning.   No facility-administered encounter medications on file as of  08/12/2017.     Allergy:  Allergies  Allergen Reactions  . Ativan [Lorazepam]     "HALLUCINATIONS"  . Flagyl [Metronidazole] Rash    Social Hx:   Social History   Socioeconomic History  . Marital status: Widowed    Spouse name: Not on file  . Number of children: 1  . Years of education: Not on file  . Highest education level: Not on file  Occupational History  . Not on file  Social Needs  . Financial resource strain: Not on file  . Food insecurity:    Worry: Not on file    Inability: Not on file  . Transportation needs:    Medical: Not on file    Non-medical: Not on file  Tobacco Use  . Smoking status: Never Smoker  . Smokeless tobacco: Never Used  Substance and Sexual Activity  . Alcohol use: No    Comment: occasional  . Drug use: No  . Sexual activity: Never  Lifestyle  . Physical activity:    Days per week: Not on file    Minutes per session: Not on file  . Stress: Not on file  Relationships  . Social connections:  Talks on phone: Not on file    Gets together: Not on file    Attends religious service: Not on file    Active member of club or organization: Not on file    Attends meetings of clubs or organizations: Not on file    Relationship status: Not on file  . Intimate partner violence:    Fear of current or ex partner: Not on file    Emotionally abused: Not on file    Physically abused: Not on file    Forced sexual activity: Not on file  Other Topics Concern  . Not on file  Social History Narrative  . Not on file    Past Surgical Hx:  Past Surgical History:  Procedure Laterality Date  . APPENDECTOMY  1954  . CATARACT EXTRACTION W/ INTRAOCULAR LENS  IMPLANT, BILATERAL  1994  . colonscopy    . DILATION AND CURETTAGE OF UTERUS N/A 11/20/2015   Procedure: DILATATION AND CURETTAGE;  Surgeon: Everitt Amber, MD;  [facility]: [facility];  Service: Gynecology;  Laterality: N/A;  . DILATION AND CURETTAGE, DIAGNOSTIC / THERAPEUTIC N/A  novmber 2017   per daughter with biopsy as stated  . KNEE ARTHROSCOPY Right 2002  . MASTECTOMY Left 1998   w/ lympn node dissection's  . ROBOTIC ASSISTED TOTAL HYSTERECTOMY WITH BILATERAL SALPINGO OOPHERECTOMY Bilateral 01/27/2016   Procedure: XI ROBOTIC ASSISTED TOTAL HYSTERECTOMY WITH BILATERAL SALPINGO OOPHORECTOMY;  Surgeon: Everitt Amber, MD;  Location: [facility];  Service: Gynecology;  Laterality: Bilateral;    Past Medical Hx:  Past Medical History:  Diagnosis Date  . Atrial fibrillation ([place])   . Bruises easily   . Cancer ([place]) 1998   left tamoxifem x 5 yrs  . Coronary artery disease    CARDIOLOGIST-  DR Geraldo Pitter ([facility])  . Endometrial cancer ([place])   . Endometrial cancer ([place])   . Fall 06/2017   Patient fell and hit her head , patient's daughter's states patient may have fell on the kitchen counter  . GERD (gastroesophageal reflux disease)   . Glaucoma, both eyes   . History of breast cancer    1998--  s/p  left mastectomy with sln dissection's  ,  no chemo or radiation-  no recurrence  . History of Clostridium difficile infection 5-6 yrs ago  . History of TIAs 12/22/2015   yrs ago ? AND QUESTIONABLE ONE YRS AGO  . Hyperlipidemia   . Hypertension   . Macular degeneration   . Narrow complex tachycardia [facility])    cardiologist-  dr Salley Scarlet  . OA (osteoarthritis)    right knee  . PMB (postmenopausal bleeding)   . PONV (postoperative nausea and vomiting)    ponv after appendectomy 1954, DID WELL AFTER D AND C RECENT  . Thickened endometrium   . Wears glasses     Past Gynecological History:  G1 (SVD) No LMP recorded. Patient is postmenopausal.  Family Hx:  Family History  Problem Relation Age of Onset  . Hypertension Mother   . Hypertension Father   . Liver cancer Brother   . Colon cancer Brother     Review of Systems:  Constitutional  Feels well,    ENT Normal appearing ears and nares bilaterally Skin/Breast  No rash, sores, jaundice,  itching, dryness Cardiovascular  No chest pain, shortness of breath, or edema  Pulmonary  No cough or wheeze.  Gastro Intestinal  No nausea, vomitting, or diarrhoea. No bright red blood per rectum, no abdominal pain, change  in bowel movement, or constipation.  Genito Urinary  No frequency, urgency, dysuria, no postmenopausal bleeding Musculo Skeletal  No myalgia, arthralgia, joint swelling or pain  Neurologic  No weakness, numbness, change in gait,  Psychology  No depression, anxiety, insomnia.   Vitals:  Blood pressure (!) 187/57, pulse 63, temperature 99.1 F (37.3 C), temperature source Oral, resp. rate 20, height _0  (1.626 m), weight 132 lb 4.8 oz (60 kg), SpO2 100 %.  Physical Exam: WD in NAD Neck  Supple NROM, without any enlargements.  Lymph Node Survey No cervical supraclavicular or inguinal adenopathy Cardiovascular  Pulse normal rate, regularity and rhythm. S1 and S2 normal.  Lungs  Clear to auscultation bilateraly, without wheezes/crackles/rhonchi. Good air movement.  Skin  No rash/lesions/breakdown  Psychiatry  Alert and oriented to person, place, and time  Abdomen  Normoactive bowel sounds, abdomen soft, non-tender and thin without evidence of hernia.  Back No CVA tenderness Genito Urinary  Normal external genitalia. Residual erythema from prior recurrence site at right vaginal fornix. There is no gross residual tumor within the vagina. Bimanual rectovaginal exam reveals no masses. Rectal  No masses, normal mucosa Extremities  No bilateral cyanosis, clubbing or edema.  Thereasa Solo, MD  08/12/2017, 1:44 PM

## 2017-08-12 NOTE — Patient Instructions (Signed)
Please notify Dr Denman George at phone number [phone] if you notice vaginal bleeding, new pelvic or abdominal pains, bloating, feeling full easy, or a change in bladder or bowel function.   Please return to see Dr Denman George in October, 2019.

## 2017-09-01 DIAGNOSIS — M170 Bilateral primary osteoarthritis of knee: Secondary | ICD-10-CM | POA: Diagnosis not present

## 2017-09-01 DIAGNOSIS — G8929 Other chronic pain: Secondary | ICD-10-CM | POA: Diagnosis not present

## 2017-09-13 DIAGNOSIS — Z1231 Encounter for screening mammogram for malignant neoplasm of breast: Secondary | ICD-10-CM | POA: Diagnosis not present

## 2017-09-21 DIAGNOSIS — R922 Inconclusive mammogram: Secondary | ICD-10-CM | POA: Diagnosis not present

## 2017-09-21 DIAGNOSIS — Z8542 Personal history of malignant neoplasm of other parts of uterus: Secondary | ICD-10-CM | POA: Diagnosis not present

## 2017-09-21 DIAGNOSIS — Z923 Personal history of irradiation: Secondary | ICD-10-CM | POA: Diagnosis not present

## 2017-09-21 DIAGNOSIS — C541 Malignant neoplasm of endometrium: Secondary | ICD-10-CM | POA: Diagnosis not present

## 2017-09-21 DIAGNOSIS — N6489 Other specified disorders of breast: Secondary | ICD-10-CM | POA: Diagnosis not present

## 2017-09-21 DIAGNOSIS — D649 Anemia, unspecified: Secondary | ICD-10-CM | POA: Diagnosis not present

## 2017-09-21 DIAGNOSIS — D509 Iron deficiency anemia, unspecified: Secondary | ICD-10-CM | POA: Diagnosis not present

## 2017-09-21 DIAGNOSIS — C7989 Secondary malignant neoplasm of other specified sites: Secondary | ICD-10-CM | POA: Diagnosis not present

## 2017-09-21 DIAGNOSIS — Z853 Personal history of malignant neoplasm of breast: Secondary | ICD-10-CM | POA: Diagnosis not present

## 2017-09-21 DIAGNOSIS — N649 Disorder of breast, unspecified: Secondary | ICD-10-CM | POA: Diagnosis not present

## 2017-11-03 ENCOUNTER — Telehealth: Payer: Self-pay | Admitting: *Deleted

## 2017-11-03 NOTE — Telephone Encounter (Signed)
Patient's daughter called and canceled appt for Monday. She stated that "Mom is doing well, and not having any issues. Shes 89 and well. We will call back if she starts having issues." Melissa APP notified

## 2017-11-07 ENCOUNTER — Inpatient Hospital Stay: Payer: Medicare HMO | Admitting: Gynecologic Oncology

## 2017-11-23 ENCOUNTER — Ambulatory Visit: Payer: Medicare HMO | Admitting: Gynecologic Oncology

## 2018-01-12 DIAGNOSIS — M170 Bilateral primary osteoarthritis of knee: Secondary | ICD-10-CM | POA: Diagnosis not present

## 2018-01-12 DIAGNOSIS — G8929 Other chronic pain: Secondary | ICD-10-CM | POA: Diagnosis not present

## 2018-02-10 ENCOUNTER — Encounter: Payer: Self-pay | Admitting: Cardiology

## 2018-02-10 ENCOUNTER — Ambulatory Visit (INDEPENDENT_AMBULATORY_CARE_PROVIDER_SITE_OTHER): Payer: Medicare HMO | Admitting: Cardiology

## 2018-02-10 VITALS — BP 134/70 | Ht 64.0 in | Wt 131.4 lb

## 2018-02-10 DIAGNOSIS — J449 Chronic obstructive pulmonary disease, unspecified: Secondary | ICD-10-CM | POA: Diagnosis not present

## 2018-02-10 DIAGNOSIS — I480 Paroxysmal atrial fibrillation: Secondary | ICD-10-CM | POA: Diagnosis not present

## 2018-02-10 DIAGNOSIS — Z79899 Other long term (current) drug therapy: Secondary | ICD-10-CM

## 2018-02-10 DIAGNOSIS — I10 Essential (primary) hypertension: Secondary | ICD-10-CM

## 2018-02-10 DIAGNOSIS — G459 Transient cerebral ischemic attack, unspecified: Secondary | ICD-10-CM | POA: Diagnosis not present

## 2018-02-10 NOTE — Progress Notes (Signed)
Cardiology Office Note:    Date:  02/10/2018   ID:  Sheri Hensley, DOB Sep 30, 1919, MRN [id number]  PCP:  Angelina Sheriff, MD  Cardiologist:  Jenean Lindau, MD   Referring MD: Angelina Sheriff, MD    ASSESSMENT:    1. Paroxysmal atrial fibrillation (HCC)   2. TIA (transient ischemic attack)   3. Essential hypertension    PLAN:    In order of problems listed above:  1. Primary prevention stressed with the patient.  Importance of compliance with diet and medication stressed and she vocalized understanding.  Her blood pressure is stable.  Fall precautions were discussed with the patient. 2. I discussed with the patient atrial fibrillation, disease process. Management and therapy including rate and rhythm control, anticoagulation benefits and potential risks were discussed extensively with the patient. Patient had multiple questions which were answered to patient's satisfaction. 3. Since the patient is on amiodarone therapy she will have blood work including TSH and liver test.  She will also have a chest x-ray. 4. Patient will be seen in follow-up appointment in 6 months or earlier if the patient has any concerns    Medication Adjustments/Labs and Tests Ordered: Current medicines are reviewed at length with the patient today.  Concerns regarding medicines are outlined above.  No orders of the defined types were placed in this encounter.  No orders of the defined types were placed in this encounter.    Chief Complaint  Patient presents with  . Follow-up     History of Present Illness:    Sheri Hensley is a 85 y.o. female.  Patient has paroxysmal atrial atrial fibrillation.  She denies any problems at this time and takes care of activities of daily living.  She looks amazingly good for her age.  She lives by herself and ambulates well her gait is steady.  At the time of my evaluation, the patient is alert awake oriented and in no distress.  Past Medical  History:  Diagnosis Date  . Atrial fibrillation ([place])   . Bruises easily   . Cancer ([place]) 1998   left tamoxifem x 5 yrs  . Coronary artery disease    CARDIOLOGIST-  DR Geraldo Pitter ([facility])  . Endometrial cancer ([place])   . Endometrial cancer ([place])   . Fall 06/2017   Patient fell and hit her head , patient's daughter's states patient may have fell on the kitchen counter  . GERD (gastroesophageal reflux disease)   . Glaucoma, both eyes   . History of breast cancer    1998--  s/p  left mastectomy with sln dissection's  ,  no chemo or radiation-  no recurrence  . History of Clostridium difficile infection 5-6 yrs ago  . History of TIAs 12/22/2015   yrs ago ? AND QUESTIONABLE ONE YRS AGO  . Hyperlipidemia   . Hypertension   . Macular degeneration   . Narrow complex tachycardia [facility])    cardiologist-  dr Salley Scarlet  . OA (osteoarthritis)    right knee  . PMB (postmenopausal bleeding)   . PONV (postoperative nausea and vomiting)    ponv after appendectomy 1954, DID WELL AFTER D AND C RECENT  . Thickened endometrium   . Wears glasses     Past Surgical History:  Procedure Laterality Date  . APPENDECTOMY  1954  . CATARACT EXTRACTION W/ INTRAOCULAR LENS  IMPLANT, BILATERAL  1994  . colonscopy    . DILATION AND CURETTAGE OF UTERUS  N/A 11/20/2015   Procedure: DILATATION AND CURETTAGE;  Surgeon: Everitt Amber, MD;  Location: [facility];  Service: Gynecology;  Laterality: N/A;  . DILATION AND CURETTAGE, DIAGNOSTIC / THERAPEUTIC N/A novmber 2017   per daughter with biopsy as stated  . KNEE ARTHROSCOPY Right 2002  . MASTECTOMY Left 1998   w/ lympn node dissection's  . ROBOTIC ASSISTED TOTAL HYSTERECTOMY WITH BILATERAL SALPINGO OOPHERECTOMY Bilateral 01/27/2016   Procedure: XI ROBOTIC ASSISTED TOTAL HYSTERECTOMY WITH BILATERAL SALPINGO OOPHORECTOMY;  Surgeon: Everitt Amber, MD;  Location: [facility];  Service: Gynecology;  Laterality: Bilateral;    Current  Medications: Current Meds  Medication Sig  . acetaminophen (TYLENOL) 500 MG tablet Take 500 mg by mouth every 6 (six) hours as needed for moderate pain or headache.  Marland Kitchen amiodarone (PACERONE) 200 MG tablet Take 0.5 tablets (100 mg total) by mouth every morning.  Marland Kitchen atorvastatin (LIPITOR) 10 MG tablet Take 1 tablet (10 mg total) by mouth at bedtime.  . betamethasone acetate-betamethasone sodium phosphate (CELESTONE) 6 (3-3) MG/ML injection Inject 12 mg into the articular space every 3 (three) months. Next injection due end of January  . bimatoprost (LUMIGAN) 0.01 % SOLN Place 1 drop into both eyes at bedtime.  . brimonidine (ALPHAGAN P) 0.1 % SOLN Place 1 drop into both eyes every 8 (eight) hours.   . clopidogrel (PLAVIX) 75 MG tablet Take 1 tablet (75 mg total) by mouth daily. (Patient taking differently: Take 75 mg by mouth daily. Will stop prior to surgery on 01-17-16 and will start 81 mg Aspirin)  . cyanocobalamin (V-R VITAMIN B-12) 500 MCG tablet Take 500 mcg by mouth daily.   Marland Kitchen ibuprofen (ADVIL,MOTRIN) 600 MG tablet Take 1 tablet (600 mg total) by mouth every 6 (six) hours as needed (mild pain).  Marland Kitchen lisinopril (PRINIVIL,ZESTRIL) 10 MG tablet Take 10 mg by mouth every morning.   . loratadine (CLARITIN) 10 MG tablet Take 10 mg by mouth daily as needed for allergies.  . Melatonin 10 MG CAPS Take 10 mg by mouth at bedtime.   . metoprolol tartrate (LOPRESSOR) 25 MG tablet Take 12.5 mg by mouth 2 (two) times daily.   . Multiple Vitamins-Minerals (CENTRUM VITAMINTS PO) Take 1 tablet by mouth every morning.  . Multiple Vitamins-Minerals (ICAPS AREDS 2) CAPS Take 1 tablet by mouth 2 (two) times daily.   Marland Kitchen omeprazole (PRILOSEC) 20 MG capsule Take 20 mg by mouth every morning.  . sertraline (ZOLOFT) 50 MG tablet Take 50 mg by mouth daily.     Allergies:   Ativan [lorazepam] and Flagyl [metronidazole]   Social History   Socioeconomic History  . Marital status: Widowed    Spouse name: Not on file  .  Number of children: 1  . Years of education: Not on file  . Highest education level: Not on file  Occupational History  . Not on file  Social Needs  . Financial resource strain: Not on file  . Food insecurity:    Worry: Not on file    Inability: Not on file  . Transportation needs:    Medical: Not on file    Non-medical: Not on file  Tobacco Use  . Smoking status: Never Smoker  . Smokeless tobacco: Never Used  Substance and Sexual Activity  . Alcohol use: No    Comment: occasional  . Drug use: No  . Sexual activity: Never  Lifestyle  . Physical activity:    Days per week: Not on file    Minutes  per session: Not on file  . Stress: Not on file  Relationships  . Social connections:    Talks on phone: Not on file    Gets together: Not on file    Attends religious service: Not on file    Active member of club or organization: Not on file    Attends meetings of clubs or organizations: Not on file    Relationship status: Not on file  Other Topics Concern  . Not on file  Social History Narrative  . Not on file     Family History: The patient's family history includes Colon cancer in her brother; Hypertension in her father and mother; Liver cancer in her brother.  ROS:   Please see the history of present illness.    All other systems reviewed and are negative.  EKGs/Labs/Other Studies Reviewed:    The following studies were reviewed today: I discussed my findings with the patient at extensive length.   Recent Labs: No results found for requested labs within last 8760 hours.  Recent Lipid Panel    Component Value Date/Time   CHOL 127 08/10/2016 1615   TRIG 321 (H) 08/10/2016 1615   HDL 32 (L) 08/10/2016 1615   CHOLHDL 4.0 08/10/2016 1615   CHOLHDL 3.2 12/23/2015 0425   VLDL 26 12/23/2015 0425   LDLCALC 31 08/10/2016 1615    Physical Exam:    VS:  BP 134/70   Ht 5\' 4"  (1.626 m)   Wt 131 lb 6.4 oz (59.6 kg)   BMI 22.55 kg/m     Wt Readings from Last 3  Encounters:  02/10/18 131 lb 6.4 oz (59.6 kg)  08/12/17 132 lb 4.8 oz (60 kg)  05/10/17 133 lb 4.8 oz (60.5 kg)     GEN: Patient is in no acute distress HEENT: Normal NECK: No JVD; No carotid bruits LYMPHATICS: No lymphadenopathy CARDIAC: Hear sounds regular, 2/6 systolic murmur at the apex. RESPIRATORY:  Clear to auscultation without rales, wheezing or rhonchi  ABDOMEN: Soft, non-tender, non-distended MUSCULOSKELETAL:  No edema; No deformity  SKIN: Warm and dry NEUROLOGIC:  Alert and oriented x 3 PSYCHIATRIC:  Normal affect   Signed, Jenean Lindau, MD  02/10/2018 12:21 PM    [facility]

## 2018-02-10 NOTE — Addendum Note (Signed)
Addended by: Kathyrn Sheriff on: 02/10/2018 12:34 PM   Modules accepted: Orders

## 2018-02-10 NOTE — Patient Instructions (Signed)
Medication Instructions:  Your physician recommends that you continue on your current medications as directed. Please refer to the Current Medication list given to you today. If you need a refill on your cardiac medications before your next appointment, please call your pharmacy.   Lab work: Your physician recommends that you return for lab work in: today If you have labs (blood work) drawn today and your tests are completely normal, you will receive your results only by: Marland Kitchen MyChart Message (if you have MyChart) OR . A paper copy in the mail If you have any lab test that is abnormal or we need to change your treatment, we will call you to review the results.  Testing/Procedures: Chest X-Ray   Follow-Up: At [facility], you and your health needs are our priority.  As part of our continuing mission to provide you with exceptional heart care, we have created designated Provider Care Teams.  These Care Teams include your primary Cardiologist (physician) and Advanced Practice Providers (APPs -  Physician Assistants and Nurse Practitioners) who all work together to provide you with the care you need, when you need it. You will need a follow up appointment in 6 months.  Please call our office 2 months in advance to schedule this appointment.  You may see  or another member of our [employer] Provider Team in [place]: Jenne Campus, MD . Shirlee More, MD  Any Other Special Instructions Will Be Listed Below (If Applicable). None

## 2018-02-11 LAB — HEPATIC FUNCTION PANEL
ALBUMIN: 4.7 g/dL — AB (ref 3.5–4.6)
ALT: 19 IU/L (ref 0–32)
AST: 22 IU/L (ref 0–40)
Alkaline Phosphatase: 80 IU/L (ref 39–117)
Bilirubin Total: 1.1 mg/dL (ref 0.0–1.2)
Bilirubin, Direct: 0.24 mg/dL (ref 0.00–0.40)
Total Protein: 6.8 g/dL (ref 6.0–8.5)

## 2018-02-11 LAB — BASIC METABOLIC PANEL
BUN/Creatinine Ratio: 21 (ref 12–28)
BUN: 27 mg/dL (ref 10–36)
CALCIUM: 9.7 mg/dL (ref 8.7–10.3)
CO2: 24 mmol/L (ref 20–29)
Chloride: 101 mmol/L (ref 96–106)
Creatinine, Ser: 1.26 mg/dL — ABNORMAL HIGH (ref 0.57–1.00)
GFR calc non Af Amer: 36 mL/min/{1.73_m2} — ABNORMAL LOW (ref >59)
GFR, EST AFRICAN AMERICAN: 42 mL/min/{1.73_m2} — AB (ref >59)
Glucose: 93 mg/dL (ref 65–99)
Potassium: 4.4 mmol/L (ref 3.5–5.2)
Sodium: 142 mmol/L (ref 134–144)

## 2018-02-11 LAB — TSH: TSH: 4.93 u[IU]/mL — ABNORMAL HIGH (ref 0.450–4.500)

## 2018-02-13 ENCOUNTER — Telehealth: Payer: Self-pay | Admitting: Emergency Medicine

## 2018-02-13 DIAGNOSIS — I10 Essential (primary) hypertension: Secondary | ICD-10-CM

## 2018-02-13 NOTE — Telephone Encounter (Signed)
Patient daughter informed of results of chest xray.

## 2018-02-13 NOTE — Telephone Encounter (Signed)
Patient's daughter informed of results and advised to have patient have labs redrawn in 2 weeks and stay well hydrated. She verbally understands.

## 2018-02-21 DIAGNOSIS — H401131 Primary open-angle glaucoma, bilateral, mild stage: Secondary | ICD-10-CM | POA: Diagnosis not present

## 2018-03-03 DIAGNOSIS — I10 Essential (primary) hypertension: Secondary | ICD-10-CM | POA: Diagnosis not present

## 2018-03-03 LAB — BASIC METABOLIC PANEL
BUN/Creatinine Ratio: 23 (ref 12–28)
BUN: 27 mg/dL (ref 10–36)
CO2: 22 mmol/L (ref 20–29)
Calcium: 9.1 mg/dL (ref 8.7–10.3)
Chloride: 102 mmol/L (ref 96–106)
Creatinine, Ser: 1.16 mg/dL — ABNORMAL HIGH (ref 0.57–1.00)
GFR calc Af Amer: 46 mL/min/{1.73_m2} — ABNORMAL LOW (ref >59)
GFR calc non Af Amer: 40 mL/min/{1.73_m2} — ABNORMAL LOW (ref >59)
Glucose: 97 mg/dL (ref 65–99)
Potassium: 4.2 mmol/L (ref 3.5–5.2)
Sodium: 139 mmol/L (ref 134–144)

## 2018-03-21 DIAGNOSIS — M810 Age-related osteoporosis without current pathological fracture: Secondary | ICD-10-CM | POA: Diagnosis not present

## 2018-03-21 DIAGNOSIS — Z8673 Personal history of transient ischemic attack (TIA), and cerebral infarction without residual deficits: Secondary | ICD-10-CM | POA: Diagnosis not present

## 2018-03-21 DIAGNOSIS — I10 Essential (primary) hypertension: Secondary | ICD-10-CM | POA: Diagnosis not present

## 2018-03-21 DIAGNOSIS — Z0000 Encounter for general adult medical examination without abnormal findings: Secondary | ICD-10-CM | POA: Diagnosis not present

## 2018-03-21 DIAGNOSIS — R69 Illness, unspecified: Secondary | ICD-10-CM | POA: Diagnosis not present

## 2018-03-21 DIAGNOSIS — Z6821 Body mass index (BMI) 21.0-21.9, adult: Secondary | ICD-10-CM | POA: Diagnosis not present

## 2018-03-21 DIAGNOSIS — Z9181 History of falling: Secondary | ICD-10-CM | POA: Diagnosis not present

## 2018-03-21 DIAGNOSIS — Z1331 Encounter for screening for depression: Secondary | ICD-10-CM | POA: Diagnosis not present

## 2018-03-21 DIAGNOSIS — I4891 Unspecified atrial fibrillation: Secondary | ICD-10-CM | POA: Diagnosis not present

## 2018-03-27 IMAGING — CT CT HEAD CODE STROKE
3 of 4 series · 16 of 47 positions shown, 19 images · non-contrast
Comparison: 03/12/2015 CT head.

ADDENDUM:
These results were called by telephone at the time of interpretation
on 12/22/2015 at [DATE] to Dr. Yoel, who verbally acknowledged
these results.

By: Capincho Malfara M.D.
CLINICAL DATA: Code stroke.  Right-sided weakness and numbness.
EXAM:
CT HEAD WITHOUT CONTRAST
TECHNIQUE: Contiguous axial images were obtained from the base of the skull
through the vertex without intravenous contrast.

[Series 201: head w/o, idose (1) · axial · non-contrast · 0.44mm/px · z∈[+75,+205]mm · 10 of 32 slices shown, 13 images]
[im 3/32  brain]
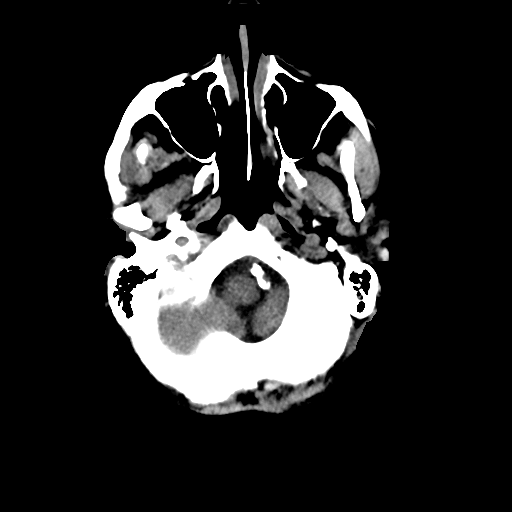
[im 3/32  bone]
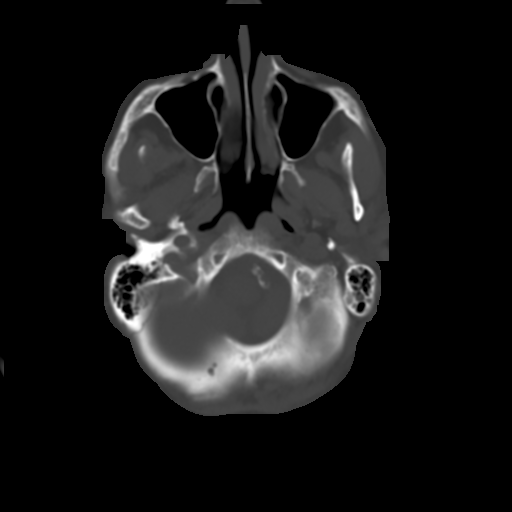
[im 5/32  brain]
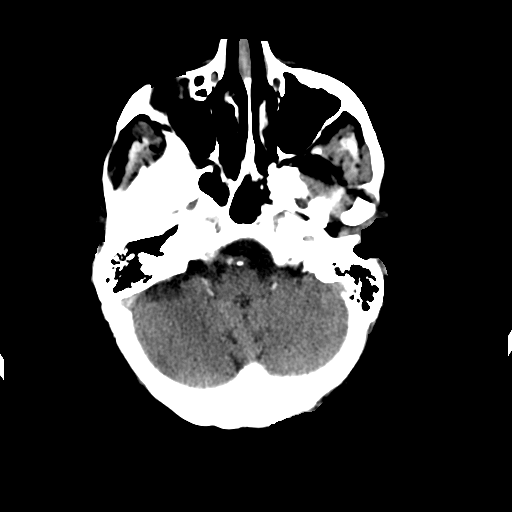
[im 9/32  brain]
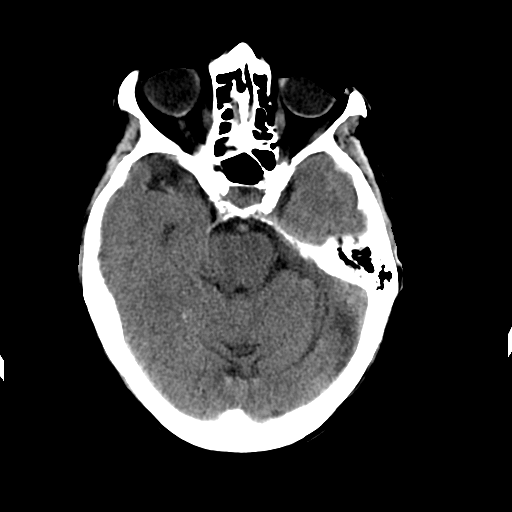
[im 12/32  brain]
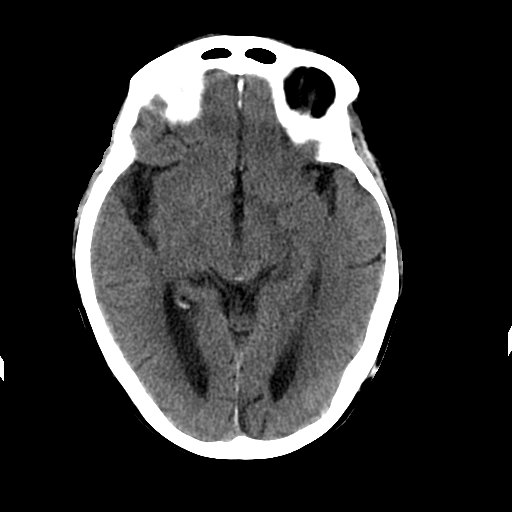
[im 14/32  brain]
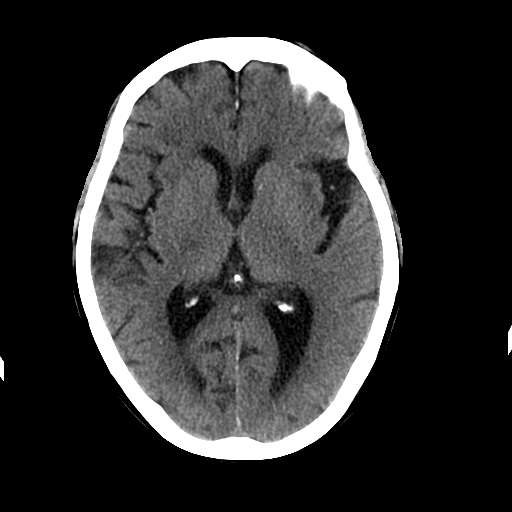
[im 14/32  bone]
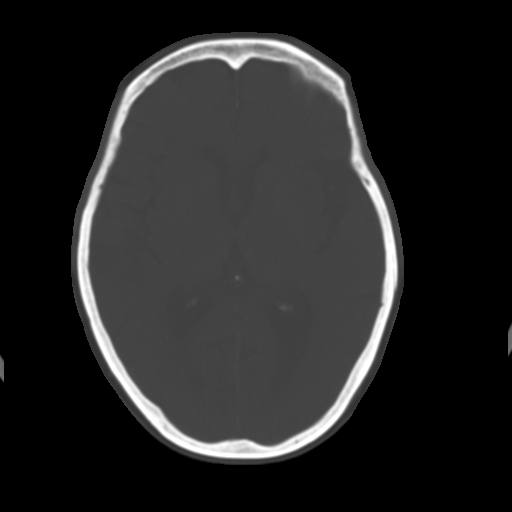
[im 18/32  brain]
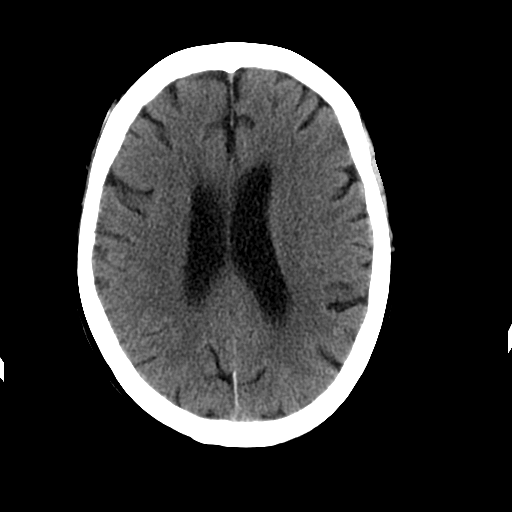
[im 20/32  brain]
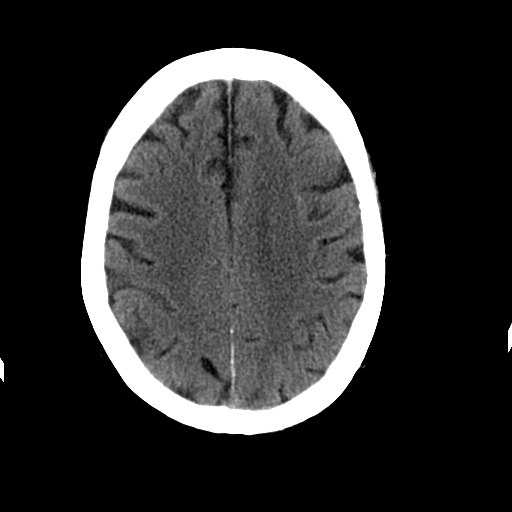
[im 23/32  brain]
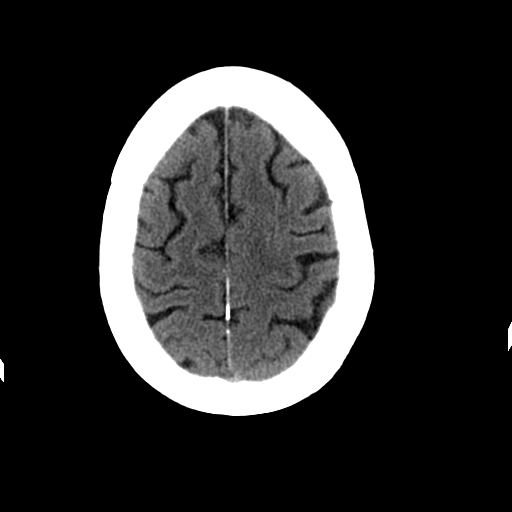
[im 27/32  brain]
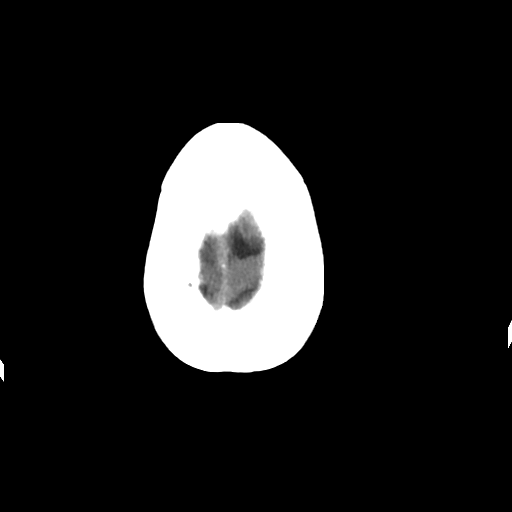
[im 27/32  bone]
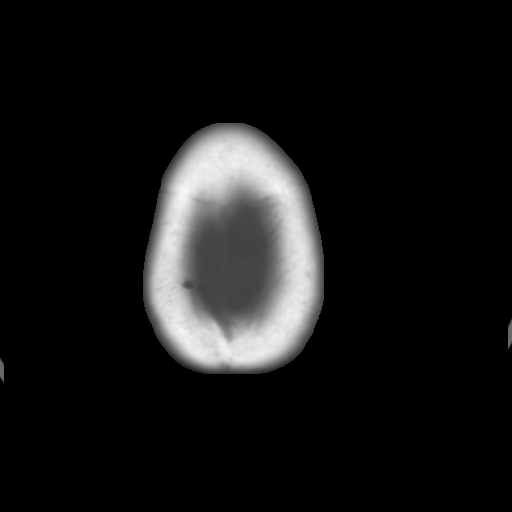
[im 29/32  brain]
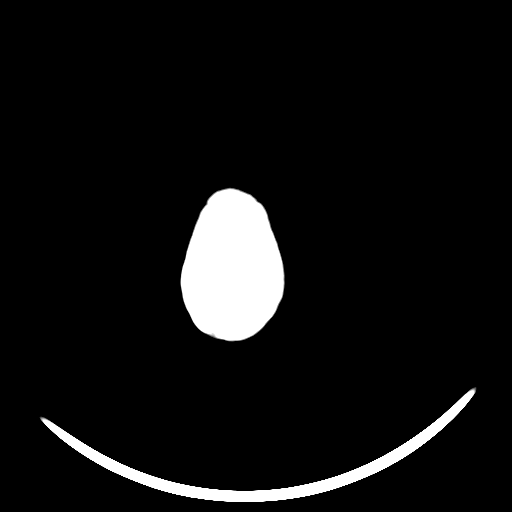

[Series 203: coronal st, idose (1) · coronal · 0.40mm/px · 3 of 75 slices shown]
[im 25/75  brain]
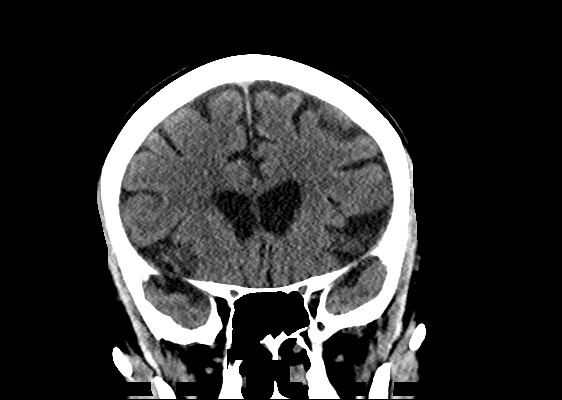
[im 33/75  brain]
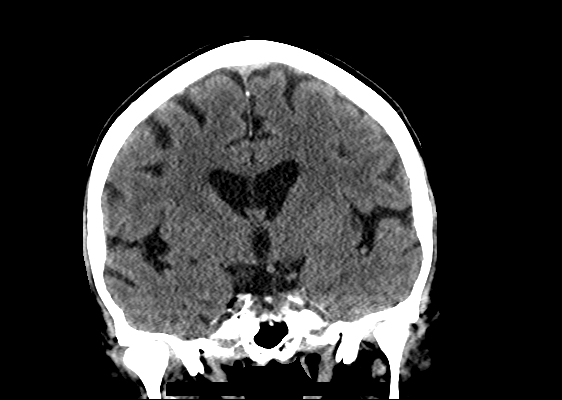
[im 42/75  brain]
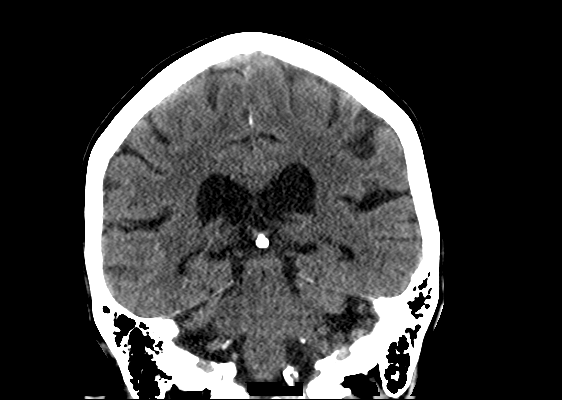

[Series 204: sagittal st, idose (1) · sagittal · 0.40mm/px · 3 of 75 slices shown]
[im 25/75  brain]
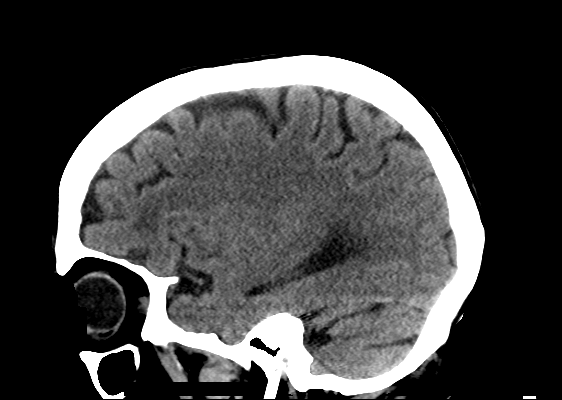
[im 38/75  brain]
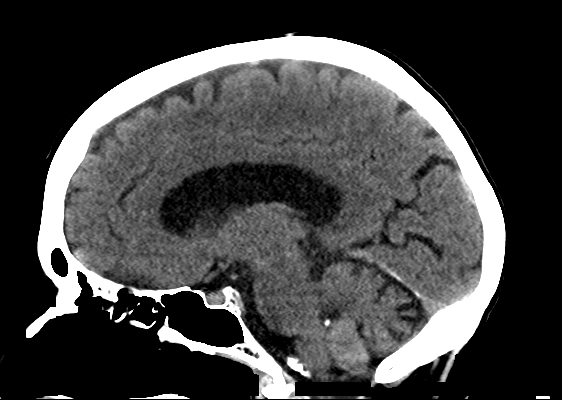
[im 50/75  brain]
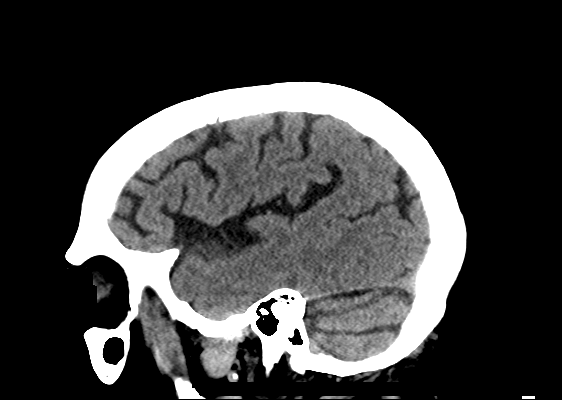

[16 of 47 positions shown; findings below may reference images not displayed]

FINDINGS: Brain: No evidence of acute infarction, hemorrhage, hydrocephalus,
extra-axial collection or mass lesion/mass effect. Few foci of
hypoattenuation within subcortical white matter compatible with mild
chronic microvascular ischemic changes and stable. Mild brain
parenchymal volume loss.

Vascular: No hyperdense vessel. Unchanged calcifications at the tip
of basilar, bilateral vertebral arteries, and within the internal
carotid arteries.

Skull: Normal. Negative for fracture or focal lesion.

Sinuses/Orbits: No acute finding. Bilateral intra-ocular lens
replacement.

Other: None.

ASPECTS (Alberta Stroke Program Early CT Score)

- Ganglionic level infarction (caudate, lentiform nuclei, internal
capsule, insula, M1-M3 cortex): 7

- Supraganglionic infarction (M4-M6 cortex): 3

Total score (0-10 with 10 being normal): 10
IMPRESSION: 1. No acute intracranial abnormality identified. If symptoms persist
or if clinically indicated MRI is more sensitive for acute stroke.
2. Mild chronic microvascular ischemic changes, parenchymal volume
loss, and intracranial calcific atherosclerosis.
3. ASPECTS is 10

By: Capincho Malfara M.D.

## 2018-04-07 ENCOUNTER — Other Ambulatory Visit: Payer: Self-pay | Admitting: Cardiology

## 2018-04-10 ENCOUNTER — Telehealth: Payer: Self-pay | Admitting: Cardiology

## 2018-04-10 ENCOUNTER — Other Ambulatory Visit: Payer: Self-pay

## 2018-04-10 MED ORDER — AMIODARONE HCL 200 MG PO TABS: 100.0000 mg | ORAL_TABLET | Freq: Every morning | ORAL | 1 refills | Status: DC

## 2018-04-10 NOTE — Telephone Encounter (Signed)
°  Patient is out of medication today   1. Which medications need to be refilled? (please list name of each medication and dose if known) Amiodarone 200mg   2. Which pharmacy/location (including street and city if local pharmacy) is medication to be sent to? [facility] on [address] [place]  3. Do they need a 30 day or 90 day supply? [place]

## 2018-04-10 NOTE — Telephone Encounter (Signed)
Refill sent task complete. 

## 2018-08-22 DIAGNOSIS — Z961 Presence of intraocular lens: Secondary | ICD-10-CM | POA: Diagnosis not present

## 2018-08-22 DIAGNOSIS — H401131 Primary open-angle glaucoma, bilateral, mild stage: Secondary | ICD-10-CM | POA: Diagnosis not present

## 2018-09-11 DIAGNOSIS — N390 Urinary tract infection, site not specified: Secondary | ICD-10-CM | POA: Diagnosis not present

## 2018-09-11 DIAGNOSIS — R109 Unspecified abdominal pain: Secondary | ICD-10-CM | POA: Diagnosis not present

## 2018-09-11 DIAGNOSIS — R300 Dysuria: Secondary | ICD-10-CM | POA: Diagnosis not present

## 2018-09-13 ENCOUNTER — Other Ambulatory Visit: Payer: Self-pay | Admitting: Family Medicine

## 2018-09-13 DIAGNOSIS — Z1231 Encounter for screening mammogram for malignant neoplasm of breast: Secondary | ICD-10-CM

## 2018-09-15 DIAGNOSIS — R109 Unspecified abdominal pain: Secondary | ICD-10-CM | POA: Diagnosis not present

## 2018-10-09 ENCOUNTER — Ambulatory Visit (INDEPENDENT_AMBULATORY_CARE_PROVIDER_SITE_OTHER): Payer: Medicare HMO | Admitting: Cardiology

## 2018-10-09 ENCOUNTER — Encounter: Payer: Self-pay | Admitting: Cardiology

## 2018-10-09 ENCOUNTER — Other Ambulatory Visit: Payer: Self-pay

## 2018-10-09 VITALS — BP 180/90 | HR 69 | Temp 97.7°F | Ht 64.0 in | Wt 128.0 lb

## 2018-10-09 DIAGNOSIS — I10 Essential (primary) hypertension: Secondary | ICD-10-CM

## 2018-10-09 DIAGNOSIS — Z1329 Encounter for screening for other suspected endocrine disorder: Secondary | ICD-10-CM | POA: Diagnosis not present

## 2018-10-09 DIAGNOSIS — I480 Paroxysmal atrial fibrillation: Secondary | ICD-10-CM | POA: Diagnosis not present

## 2018-10-09 DIAGNOSIS — G459 Transient cerebral ischemic attack, unspecified: Secondary | ICD-10-CM | POA: Diagnosis not present

## 2018-10-09 NOTE — Patient Instructions (Signed)
Medication Instructions:  Your physician recommends that you continue on your current medications as directed. Please refer to the Current Medication list given to you today.  If you need a refill on your cardiac medications before your next appointment, please call your pharmacy.   Lab work: Your physician recommends that you have a BMP, TSH and hepatic drawn today.  If you have labs (blood work) drawn today and your tests are completely normal, you will receive your results only by: Marland Kitchen MyChart Message (if you have MyChart) OR . A paper copy in the mail If you have any lab test that is abnormal or we need to change your treatment, we will call you to review the results.  Testing/Procedures: NONE  Follow-Up: At [facility], you and your health needs are our priority.  As part of our continuing mission to provide you with exceptional heart care, we have created designated Provider Care Teams.  These Care Teams include your primary Cardiologist (physician) and Advanced Practice Providers (APPs -  Physician Assistants and Nurse Practitioners) who all work together to provide you with the care you need, when you need it. You will need a follow up appointment in 6 months.

## 2018-10-09 NOTE — Progress Notes (Signed)
Cardiology Office Note:    Date:  10/09/2018   ID:  Sheri Hensley, DOB 1919/12/07, MRN [id number]  PCP:  Josetta Huddle, MD  Cardiologist:  Jenean Lindau, MD   Referring MD: Angelina Sheriff, MD    ASSESSMENT:    1. Paroxysmal atrial fibrillation (HCC)   2. TIA (transient ischemic attack)   3. Essential hypertension    PLAN:    In order of problems listed above:  1. Paroxysmal atrial fibrillation:I discussed with the patient atrial fibrillation, disease process. Management and therapy including rate and rhythm control, anticoagulation benefits and potential risks were discussed extensively with the patient. Patient had multiple questions which were answered to patient's satisfaction.  The patient is not on anticoagulation because of gait instability and fall potential.  She uses a walker at home.  I had an extensive discussion with her daughter about this today we concur 2. Essential hypertension: Blood pressure is stable.  She has not taken her medications now she plans to be very compliant and her daughter is going to monitor her because she has moved next door to her daughter.  She will keep a track of her blood pressures and if they are elevated they will give [place] a call 3. Patient will be seen in follow-up appointment in 6 months or earlier if the patient has any concerns    Medication Adjustments/Labs and Tests Ordered: Current medicines are reviewed at length with the patient today.  Concerns regarding medicines are outlined above.  No orders of the defined types were placed in this encounter.  No orders of the defined types were placed in this encounter.    Chief Complaint  Patient presents with  . Follow-up     History of Present Illness:    Sheri Hensley is a 85 y.o. female.  Patient has past medical history of essential hypertension, paroxysmal atrial fibrillation.  She has forgotten to take her medication for the past 2 days and is regretful  about it.  No chest pain orthopnea or PND.  She now has moved next door to her daughter.  At the time of my evaluation, the patient is alert awake oriented and in no distress.  She denies any palpitations or any such issues.  Past Medical History:  Diagnosis Date  . Atrial fibrillation ([place])   . Bruises easily   . Cancer ([place]) 1998   left tamoxifem x 5 yrs  . Coronary artery disease    CARDIOLOGIST-  DR Geraldo Pitter ([facility])  . Endometrial cancer ([place])   . Endometrial cancer ([place])   . Fall 06/2017   Patient fell and hit her head , patient's daughter's states patient may have fell on the kitchen counter  . GERD (gastroesophageal reflux disease)   . Glaucoma, both eyes   . History of breast cancer    1998--  s/p  left mastectomy with sln dissection's  ,  no chemo or radiation-  no recurrence  . History of Clostridium difficile infection 5-6 yrs ago  . History of TIAs 12/22/2015   yrs ago ? AND QUESTIONABLE ONE YRS AGO  . Hyperlipidemia   . Hypertension   . Macular degeneration   . Narrow complex tachycardia [facility])    cardiologist-  dr Salley Scarlet  . OA (osteoarthritis)    right knee  . PMB (postmenopausal bleeding)   . PONV (postoperative nausea and vomiting)    ponv after appendectomy 1954, DID WELL AFTER D AND C RECENT  .  Thickened endometrium   . Wears glasses     Past Surgical History:  Procedure Laterality Date  . APPENDECTOMY  1954  . CATARACT EXTRACTION W/ INTRAOCULAR LENS  IMPLANT, BILATERAL  1994  . colonscopy    . DILATION AND CURETTAGE OF UTERUS N/A 11/20/2015   Procedure: DILATATION AND CURETTAGE;  Surgeon: Everitt Amber, MD;  Location: [facility];  Service: Gynecology;  Laterality: N/A;  . DILATION AND CURETTAGE, DIAGNOSTIC / THERAPEUTIC N/A novmber 2017   per daughter with biopsy as stated  . KNEE ARTHROSCOPY Right 2002  . MASTECTOMY Left 1998   w/ lympn node dissection's  . ROBOTIC ASSISTED TOTAL HYSTERECTOMY WITH BILATERAL  SALPINGO OOPHERECTOMY Bilateral 01/27/2016   Procedure: XI ROBOTIC ASSISTED TOTAL HYSTERECTOMY WITH BILATERAL SALPINGO OOPHORECTOMY;  Surgeon: Everitt Amber, MD;  Location[facility];  Service: Gynecology;  Laterality: Bilateral;    Current Medications: Current Meds  Medication Sig  . acetaminophen (TYLENOL) 500 MG tablet Take 500 mg by mouth every 6 (six) hours as needed for moderate pain or headache.  Marland Kitchen amiodarone (PACERONE) 200 MG tablet Take 0.5 tablets (100 mg total) by mouth every morning.  Marland Kitchen atorvastatin (LIPITOR) 10 MG tablet Take 1 tablet (10 mg total) by mouth at bedtime.  . betamethasone acetate-betamethasone sodium phosphate (CELESTONE) 6 (3-3) MG/ML injection Inject 12 mg into the articular space every 3 (three) months. Next injection due end of January  . bimatoprost (LUMIGAN) 0.01 % SOLN Place 1 drop into both eyes at bedtime.  . brimonidine (ALPHAGAN P) 0.1 % SOLN Place 1 drop into both eyes every 8 (eight) hours.   . clopidogrel (PLAVIX) 75 MG tablet Take 1 tablet (75 mg total) by mouth daily. (Patient taking differently: Take 75 mg by mouth daily. Will stop prior to surgery on 01-17-16 and will start 81 mg Aspirin)  . cyanocobalamin (V-R VITAMIN B-12) 500 MCG tablet Take 500 mcg by mouth daily.   Marland Kitchen ibuprofen (ADVIL,MOTRIN) 600 MG tablet Take 1 tablet (600 mg total) by mouth every 6 (six) hours as needed (mild pain).  Marland Kitchen lisinopril (PRINIVIL,ZESTRIL) 10 MG tablet Take 10 mg by mouth every morning.   . loratadine (CLARITIN) 10 MG tablet Take 10 mg by mouth daily as needed for allergies.  . Melatonin 10 MG CAPS Take 10 mg by mouth at bedtime.   . metoprolol tartrate (LOPRESSOR) 25 MG tablet Take 12.5 mg by mouth 2 (two) times daily.   . Multiple Vitamins-Minerals (CENTRUM VITAMINTS PO) Take 1 tablet by mouth every morning.  . Multiple Vitamins-Minerals (ICAPS AREDS 2) CAPS Take 1 tablet by mouth 2 (two) times daily.   Marland Kitchen omeprazole (PRILOSEC) 20 MG capsule Take 20 mg by mouth every  morning.  . sertraline (ZOLOFT) 50 MG tablet Take 50 mg by mouth daily.     Allergies:   Ativan [lorazepam] and Flagyl [metronidazole]   Social History   Socioeconomic History  . Marital status: Widowed    Spouse name: Not on file  . Number of children: 1  . Years of education: Not on file  . Highest education level: Not on file  Occupational History  . Not on file  Social Needs  . Financial resource strain: Not on file  . Food insecurity    Worry: Not on file    Inability: Not on file  . Transportation needs    Medical: Not on file    Non-medical: Not on file  Tobacco Use  . Smoking status: Never Smoker  . Smokeless  tobacco: Never Used  Substance and Sexual Activity  . Alcohol use: No    Comment: occasional  . Drug use: No  . Sexual activity: Never  Lifestyle  . Physical activity    Days per week: Not on file    Minutes per session: Not on file  . Stress: Not on file  Relationships  . Social Herbalist on phone: Not on file    Gets together: Not on file    Attends religious service: Not on file    Active member of club or organization: Not on file    Attends meetings of clubs or organizations: Not on file    Relationship status: Not on file  Other Topics Concern  . Not on file  Social History Narrative  . Not on file     Family History: The patient's family history includes Colon cancer in her brother; Hypertension in her father and mother; Liver cancer in her brother.  ROS:   Please see the history of present illness.    All other systems reviewed and are negative.  EKGs/Labs/Other Studies Reviewed:    The following studies were reviewed today: I discussed findings from previous visits and lab work with her at extensive length.   Recent Labs: 02/10/2018: ALT 19; TSH 4.930 03/03/2018: BUN 27; Creatinine, Ser 1.16; Potassium 4.2; Sodium 139  Recent Lipid Panel    Component Value Date/Time   CHOL 127 08/10/2016 1615   TRIG 321 (H)  08/10/2016 1615   HDL 32 (L) 08/10/2016 1615   CHOLHDL 4.0 08/10/2016 1615   CHOLHDL 3.2 12/23/2015 0425   VLDL 26 12/23/2015 0425   LDLCALC 31 08/10/2016 1615    Physical Exam:    VS:  BP (!) 180/90 (BP Location: Right Arm, Patient Position: Sitting, Cuff Size: Normal)   Pulse 69   Temp 97.7 F (36.5 C)   Ht 5\' 4"  (1.626 m)   Wt 128 lb (58.1 kg)   SpO2 96%   BMI 21.97 kg/m     Wt Readings from Last 3 Encounters:  10/09/18 128 lb (58.1 kg)  02/10/18 131 lb 6.4 oz (59.6 kg)  08/12/17 132 lb 4.8 oz (60 kg)     GEN: Patient is in no acute distress HEENT: Normal NECK: No JVD; No carotid bruits LYMPHATICS: No lymphadenopathy CARDIAC: Hear sounds regular, 2/6 systolic murmur at the apex. RESPIRATORY:  Clear to auscultation without rales, wheezing or rhonchi  ABDOMEN: Soft, non-tender, non-distended MUSCULOSKELETAL:  No edema; No deformity  SKIN: Warm and dry NEUROLOGIC:  Alert and oriented x 3 PSYCHIATRIC:  Normal affect   Signed, Jenean Lindau, MD  10/09/2018 10:00 AM    [facility]

## 2018-10-09 NOTE — Addendum Note (Signed)
Addended by: Beckey Rutter on: 10/09/2018 10:14 AM   Modules accepted: Orders

## 2018-10-10 LAB — BASIC METABOLIC PANEL
BUN/Creatinine Ratio: 22 (ref 12–28)
BUN: 20 mg/dL (ref 10–36)
CO2: 25 mmol/L (ref 20–29)
Calcium: 9.4 mg/dL (ref 8.7–10.3)
Chloride: 107 mmol/L — ABNORMAL HIGH (ref 96–106)
Creatinine, Ser: 0.92 mg/dL (ref 0.57–1.00)
GFR calc Af Amer: 61 mL/min/{1.73_m2} (ref >59)
GFR calc non Af Amer: 53 mL/min/{1.73_m2} — ABNORMAL LOW (ref >59)
Glucose: 87 mg/dL (ref 65–99)
Potassium: 4.2 mmol/L (ref 3.5–5.2)
Sodium: 143 mmol/L (ref 134–144)

## 2018-10-10 LAB — HEPATIC FUNCTION PANEL
ALT: 13 IU/L (ref 0–32)
AST: 18 IU/L (ref 0–40)
Albumin: 4.6 g/dL (ref 3.5–4.6)
Alkaline Phosphatase: 101 IU/L (ref 39–117)
Bilirubin Total: 0.8 mg/dL (ref 0.0–1.2)
Bilirubin, Direct: 0.19 mg/dL (ref 0.00–0.40)
Total Protein: 6.8 g/dL (ref 6.0–8.5)

## 2018-10-10 LAB — TSH: TSH: 5.4 u[IU]/mL — ABNORMAL HIGH (ref 0.450–4.500)

## 2018-10-11 DIAGNOSIS — Z23 Encounter for immunization: Secondary | ICD-10-CM | POA: Diagnosis not present

## 2018-10-16 ENCOUNTER — Encounter: Payer: Self-pay | Admitting: *Deleted

## 2018-10-31 ENCOUNTER — Ambulatory Visit
Admission: RE | Admit: 2018-10-31 | Discharge: 2018-10-31 | Disposition: A | Discharge status: Home / Self Care | Payer: Medicare HMO | Source: Ambulatory Visit | Attending: Family Medicine | Admitting: Family Medicine

## 2018-10-31 ENCOUNTER — Other Ambulatory Visit: Payer: Self-pay

## 2018-10-31 DIAGNOSIS — Z1231 Encounter for screening mammogram for malignant neoplasm of breast: Secondary | ICD-10-CM

## 2018-11-09 DIAGNOSIS — R54 Age-related physical debility: Secondary | ICD-10-CM | POA: Diagnosis not present

## 2018-11-09 DIAGNOSIS — M549 Dorsalgia, unspecified: Secondary | ICD-10-CM | POA: Diagnosis not present

## 2018-11-13 DIAGNOSIS — M549 Dorsalgia, unspecified: Secondary | ICD-10-CM | POA: Diagnosis not present

## 2018-11-20 DIAGNOSIS — Z79899 Other long term (current) drug therapy: Secondary | ICD-10-CM | POA: Diagnosis not present

## 2018-11-20 DIAGNOSIS — I480 Paroxysmal atrial fibrillation: Secondary | ICD-10-CM | POA: Diagnosis not present

## 2018-11-20 DIAGNOSIS — M549 Dorsalgia, unspecified: Secondary | ICD-10-CM | POA: Diagnosis not present

## 2018-11-20 DIAGNOSIS — R54 Age-related physical debility: Secondary | ICD-10-CM | POA: Diagnosis not present

## 2018-11-20 DIAGNOSIS — I10 Essential (primary) hypertension: Secondary | ICD-10-CM | POA: Diagnosis not present

## 2018-11-20 DIAGNOSIS — Z90710 Acquired absence of both cervix and uterus: Secondary | ICD-10-CM | POA: Diagnosis not present

## 2018-11-20 DIAGNOSIS — E785 Hyperlipidemia, unspecified: Secondary | ICD-10-CM | POA: Diagnosis not present

## 2018-11-23 DIAGNOSIS — Z8542 Personal history of malignant neoplasm of other parts of uterus: Secondary | ICD-10-CM | POA: Diagnosis not present

## 2018-11-23 DIAGNOSIS — Z9181 History of falling: Secondary | ICD-10-CM | POA: Diagnosis not present

## 2018-11-23 DIAGNOSIS — R69 Illness, unspecified: Secondary | ICD-10-CM | POA: Diagnosis not present

## 2018-11-23 DIAGNOSIS — I10 Essential (primary) hypertension: Secondary | ICD-10-CM | POA: Diagnosis not present

## 2018-11-23 DIAGNOSIS — E785 Hyperlipidemia, unspecified: Secondary | ICD-10-CM | POA: Diagnosis not present

## 2018-11-23 DIAGNOSIS — I480 Paroxysmal atrial fibrillation: Secondary | ICD-10-CM | POA: Diagnosis not present

## 2018-11-23 DIAGNOSIS — G4700 Insomnia, unspecified: Secondary | ICD-10-CM | POA: Diagnosis not present

## 2018-11-23 DIAGNOSIS — Z8673 Personal history of transient ischemic attack (TIA), and cerebral infarction without residual deficits: Secondary | ICD-10-CM | POA: Diagnosis not present

## 2018-11-23 DIAGNOSIS — M419 Scoliosis, unspecified: Secondary | ICD-10-CM | POA: Diagnosis not present

## 2018-12-08 DIAGNOSIS — R54 Age-related physical debility: Secondary | ICD-10-CM | POA: Diagnosis not present

## 2018-12-08 DIAGNOSIS — I480 Paroxysmal atrial fibrillation: Secondary | ICD-10-CM | POA: Diagnosis not present

## 2019-01-08 ENCOUNTER — Other Ambulatory Visit: Payer: Self-pay

## 2019-01-08 ENCOUNTER — Encounter (HOSPITAL_COMMUNITY): Payer: Self-pay | Admitting: Emergency Medicine

## 2019-01-08 ENCOUNTER — Inpatient Hospital Stay (HOSPITAL_COMMUNITY)
Admission: EM | Admit: 2019-01-08 | Discharge: 2019-01-18 | DRG: 177 | Disposition: A | Discharge status: Skilled Nursing Facility | Payer: Medicare HMO | Attending: Internal Medicine | Admitting: Internal Medicine

## 2019-01-08 ENCOUNTER — Emergency Department (HOSPITAL_COMMUNITY): Payer: Medicare HMO

## 2019-01-08 DIAGNOSIS — Z888 Allergy status to other drugs, medicaments and biological substances status: Secondary | ICD-10-CM

## 2019-01-08 DIAGNOSIS — K219 Gastro-esophageal reflux disease without esophagitis: Secondary | ICD-10-CM | POA: Diagnosis not present

## 2019-01-08 DIAGNOSIS — C541 Malignant neoplasm of endometrium: Secondary | ICD-10-CM | POA: Diagnosis not present

## 2019-01-08 DIAGNOSIS — Z853 Personal history of malignant neoplasm of breast: Secondary | ICD-10-CM

## 2019-01-08 DIAGNOSIS — U071 COVID-19: Secondary | ICD-10-CM | POA: Diagnosis not present

## 2019-01-08 DIAGNOSIS — N179 Acute kidney failure, unspecified: Secondary | ICD-10-CM | POA: Diagnosis not present

## 2019-01-08 DIAGNOSIS — Z90710 Acquired absence of both cervix and uterus: Secondary | ICD-10-CM

## 2019-01-08 DIAGNOSIS — M6281 Muscle weakness (generalized): Secondary | ICD-10-CM | POA: Diagnosis not present

## 2019-01-08 DIAGNOSIS — Z7902 Long term (current) use of antithrombotics/antiplatelets: Secondary | ICD-10-CM

## 2019-01-08 DIAGNOSIS — M2550 Pain in unspecified joint: Secondary | ICD-10-CM | POA: Diagnosis not present

## 2019-01-08 DIAGNOSIS — D649 Anemia, unspecified: Secondary | ICD-10-CM | POA: Diagnosis not present

## 2019-01-08 DIAGNOSIS — D696 Thrombocytopenia, unspecified: Secondary | ICD-10-CM | POA: Diagnosis present

## 2019-01-08 DIAGNOSIS — J1282 Pneumonia due to coronavirus disease 2019: Secondary | ICD-10-CM

## 2019-01-08 DIAGNOSIS — R509 Fever, unspecified: Secondary | ICD-10-CM | POA: Diagnosis not present

## 2019-01-08 DIAGNOSIS — J9601 Acute respiratory failure with hypoxia: Secondary | ICD-10-CM | POA: Diagnosis present

## 2019-01-08 DIAGNOSIS — R0902 Hypoxemia: Secondary | ICD-10-CM | POA: Diagnosis not present

## 2019-01-08 DIAGNOSIS — Z66 Do not resuscitate: Secondary | ICD-10-CM | POA: Diagnosis present

## 2019-01-08 DIAGNOSIS — I2510 Atherosclerotic heart disease of native coronary artery without angina pectoris: Secondary | ICD-10-CM | POA: Diagnosis present

## 2019-01-08 DIAGNOSIS — J1289 Other viral pneumonia: Secondary | ICD-10-CM | POA: Diagnosis present

## 2019-01-08 DIAGNOSIS — I130 Hypertensive heart and chronic kidney disease with heart failure and stage 1 through stage 4 chronic kidney disease, or unspecified chronic kidney disease: Secondary | ICD-10-CM | POA: Diagnosis not present

## 2019-01-08 DIAGNOSIS — I5032 Chronic diastolic (congestive) heart failure: Secondary | ICD-10-CM | POA: Diagnosis not present

## 2019-01-08 DIAGNOSIS — E785 Hyperlipidemia, unspecified: Secondary | ICD-10-CM | POA: Diagnosis present

## 2019-01-08 DIAGNOSIS — Z9012 Acquired absence of left breast and nipple: Secondary | ICD-10-CM

## 2019-01-08 DIAGNOSIS — R0602 Shortness of breath: Secondary | ICD-10-CM

## 2019-01-08 DIAGNOSIS — F05 Delirium due to known physiological condition: Secondary | ICD-10-CM | POA: Diagnosis not present

## 2019-01-08 DIAGNOSIS — R278 Other lack of coordination: Secondary | ICD-10-CM | POA: Diagnosis not present

## 2019-01-08 DIAGNOSIS — J80 Acute respiratory distress syndrome: Secondary | ICD-10-CM | POA: Diagnosis not present

## 2019-01-08 DIAGNOSIS — R69 Illness, unspecified: Secondary | ICD-10-CM | POA: Diagnosis not present

## 2019-01-08 DIAGNOSIS — Z7401 Bed confinement status: Secondary | ICD-10-CM | POA: Diagnosis not present

## 2019-01-08 DIAGNOSIS — Z8249 Family history of ischemic heart disease and other diseases of the circulatory system: Secondary | ICD-10-CM

## 2019-01-08 DIAGNOSIS — I482 Chronic atrial fibrillation, unspecified: Secondary | ICD-10-CM | POA: Diagnosis not present

## 2019-01-08 DIAGNOSIS — N1831 Chronic kidney disease, stage 3a: Secondary | ICD-10-CM | POA: Diagnosis present

## 2019-01-08 DIAGNOSIS — Z8542 Personal history of malignant neoplasm of other parts of uterus: Secondary | ICD-10-CM

## 2019-01-08 DIAGNOSIS — G459 Transient cerebral ischemic attack, unspecified: Secondary | ICD-10-CM | POA: Diagnosis not present

## 2019-01-08 DIAGNOSIS — Z8673 Personal history of transient ischemic attack (TIA), and cerebral infarction without residual deficits: Secondary | ICD-10-CM

## 2019-01-08 DIAGNOSIS — I480 Paroxysmal atrial fibrillation: Secondary | ICD-10-CM | POA: Diagnosis present

## 2019-01-08 DIAGNOSIS — Z881 Allergy status to other antibiotic agents status: Secondary | ICD-10-CM

## 2019-01-08 DIAGNOSIS — R54 Age-related physical debility: Secondary | ICD-10-CM | POA: Diagnosis present

## 2019-01-08 DIAGNOSIS — R739 Hyperglycemia, unspecified: Secondary | ICD-10-CM | POA: Diagnosis present

## 2019-01-08 DIAGNOSIS — R2689 Other abnormalities of gait and mobility: Secondary | ICD-10-CM | POA: Diagnosis not present

## 2019-01-08 DIAGNOSIS — R0600 Dyspnea, unspecified: Secondary | ICD-10-CM

## 2019-01-08 DIAGNOSIS — H409 Unspecified glaucoma: Secondary | ICD-10-CM | POA: Diagnosis present

## 2019-01-08 DIAGNOSIS — Z79899 Other long term (current) drug therapy: Secondary | ICD-10-CM

## 2019-01-08 DIAGNOSIS — T380X5A Adverse effect of glucocorticoids and synthetic analogues, initial encounter: Secondary | ICD-10-CM | POA: Diagnosis not present

## 2019-01-08 DIAGNOSIS — R410 Disorientation, unspecified: Secondary | ICD-10-CM | POA: Diagnosis not present

## 2019-01-08 DIAGNOSIS — H3530 Unspecified macular degeneration: Secondary | ICD-10-CM | POA: Diagnosis present

## 2019-01-08 DIAGNOSIS — I10 Essential (primary) hypertension: Secondary | ICD-10-CM | POA: Diagnosis not present

## 2019-01-08 DIAGNOSIS — R110 Nausea: Secondary | ICD-10-CM | POA: Diagnosis not present

## 2019-01-08 DIAGNOSIS — R531 Weakness: Secondary | ICD-10-CM | POA: Diagnosis not present

## 2019-01-08 HISTORY — DX: Pneumonia due to coronavirus disease 2019: J12.82

## 2019-01-08 HISTORY — DX: Acute respiratory failure with hypoxia: J96.01

## 2019-01-08 HISTORY — DX: COVID-19: U07.1

## 2019-01-08 LAB — TRIGLYCERIDES: Triglycerides: 201 mg/dL — ABNORMAL HIGH (ref <150)

## 2019-01-08 LAB — GLUCOSE, CAPILLARY
Glucose-Capillary: 92 mg/dL (ref 70–99)
Glucose-Capillary: 235 mg/dL — ABNORMAL HIGH (ref 70–99)
Glucose-Capillary: 124 mg/dL — ABNORMAL HIGH (ref 70–99)
Glucose-Capillary: 137 mg/dL — ABNORMAL HIGH (ref 70–99)

## 2019-01-08 LAB — D-DIMER, QUANTITATIVE: D-Dimer, Quant: 0.62 ug/mL-FEU — ABNORMAL HIGH (ref 0.00–0.50)

## 2019-01-08 LAB — COMPREHENSIVE METABOLIC PANEL
ALT: 19 U/L (ref 0–44)
AST: 28 U/L (ref 15–41)
Albumin: 4.1 g/dL (ref 3.5–5.0)
Alkaline Phosphatase: 94 U/L (ref 38–126)
Anion gap: 9 (ref 5–15)
BUN: 26 mg/dL — ABNORMAL HIGH (ref 8–23)
CO2: 22 mmol/L (ref 22–32)
Calcium: 9 mg/dL (ref 8.9–10.3)
Chloride: 109 mmol/L (ref 98–111)
Creatinine, Ser: 1.05 mg/dL — ABNORMAL HIGH (ref 0.44–1.00)
GFR calc Af Amer: 52 mL/min — ABNORMAL LOW (ref >60)
GFR calc non Af Amer: 44 mL/min — ABNORMAL LOW (ref >60)
Glucose, Bld: 136 mg/dL — ABNORMAL HIGH (ref 70–99)
Potassium: 3.7 mmol/L (ref 3.5–5.1)
Sodium: 140 mmol/L (ref 135–145)
Total Bilirubin: 0.6 mg/dL (ref 0.3–1.2)
Total Protein: 7.4 g/dL (ref 6.5–8.1)

## 2019-01-08 LAB — CBC WITH DIFFERENTIAL/PLATELET
Abs Immature Granulocytes: 0.03 10*3/uL (ref 0.00–0.07)
Basophils Absolute: 0 10*3/uL (ref 0.0–0.1)
Basophils Relative: 0 %
Eosinophils Absolute: 0 10*3/uL (ref 0.0–0.5)
Eosinophils Relative: 0 %
HCT: 39.8 % (ref 36.0–46.0)
Hemoglobin: 12.7 g/dL (ref 12.0–15.0)
Immature Granulocytes: 1 %
Lymphocytes Relative: 6 %
Lymphs Abs: 0.3 10*3/uL — ABNORMAL LOW (ref 0.7–4.0)
MCH: 29.9 pg (ref 26.0–34.0)
MCHC: 31.9 g/dL (ref 30.0–36.0)
MCV: 93.6 fL (ref 80.0–100.0)
Monocytes Absolute: 0.2 10*3/uL (ref 0.1–1.0)
Monocytes Relative: 3 %
Neutro Abs: 5.1 10*3/uL (ref 1.7–7.7)
Neutrophils Relative %: 90 %
Platelets: 112 10*3/uL — ABNORMAL LOW (ref 150–400)
RBC: 4.25 MIL/uL (ref 3.87–5.11)
RDW: 15.2 % (ref 11.5–15.5)
WBC: 5.7 10*3/uL (ref 4.0–10.5)
nRBC: 0 % (ref 0.0–0.2)

## 2019-01-08 LAB — FERRITIN: Ferritin: 237 ng/mL (ref 11–307)

## 2019-01-08 LAB — LACTIC ACID, PLASMA: Lactic Acid, Venous: 1.1 mmol/L (ref 0.5–1.9)

## 2019-01-08 LAB — ABO/RH: ABO/RH(D): O NEG

## 2019-01-08 LAB — C-REACTIVE PROTEIN: CRP: 9.3 mg/dL — ABNORMAL HIGH (ref <1.0)

## 2019-01-08 LAB — PROCALCITONIN: Procalcitonin: <0.1 ng/mL

## 2019-01-08 LAB — POC SARS CORONAVIRUS 2 AG -  ED: SARS Coronavirus 2 Ag: POSITIVE — AB

## 2019-01-08 LAB — FIBRINOGEN: Fibrinogen: 569 mg/dL — ABNORMAL HIGH (ref 210–475)

## 2019-01-08 LAB — LACTATE DEHYDROGENASE: LDH: 198 U/L — ABNORMAL HIGH (ref 98–192)

## 2019-01-08 MED ORDER — ALBUTEROL SULFATE HFA 108 (90 BASE) MCG/ACT IN AERS
1.0000 | INHALATION_SPRAY | RESPIRATORY_TRACT | Status: DC | PRN
  Administered 2019-01-13 – 2019-01-14 (×2): 2 via RESPIRATORY_TRACT
  Filled 2019-01-08: qty 6.7

## 2019-01-08 MED ORDER — ENOXAPARIN SODIUM 30 MG/0.3ML ~~LOC~~ SOLN: 30.0000 mg | SUBCUTANEOUS | Status: DC

## 2019-01-08 MED ORDER — INSULIN ASPART 100 UNIT/ML ~~LOC~~ SOLN
0.0000 [IU] | SUBCUTANEOUS | Status: DC
  Administered 2019-01-08: 1 [IU] via SUBCUTANEOUS
  Administered 2019-01-08: 3 [IU] via SUBCUTANEOUS
  Administered 2019-01-09 – 2019-01-10 (×3): 1 [IU] via SUBCUTANEOUS
  Administered 2019-01-10 – 2019-01-11 (×2): 2 [IU] via SUBCUTANEOUS
  Administered 2019-01-11: 1 [IU] via SUBCUTANEOUS
  Administered 2019-01-11: 2 [IU] via SUBCUTANEOUS
  Administered 2019-01-12: 1 [IU] via SUBCUTANEOUS
  Administered 2019-01-12: 2 [IU] via SUBCUTANEOUS

## 2019-01-08 MED ORDER — ZINC SULFATE 220 (50 ZN) MG PO CAPS
220.0000 mg | ORAL_CAPSULE | Freq: Every day | ORAL | Status: DC
  Administered 2019-01-08 – 2019-01-18 (×11): 220 mg via ORAL
  Filled 2019-01-08 (×12): qty 1

## 2019-01-08 MED ORDER — AMIODARONE HCL 100 MG PO TABS
100.0000 mg | ORAL_TABLET | Freq: Every morning | ORAL | Status: DC
  Administered 2019-01-08 – 2019-01-17 (×10): 100 mg via ORAL
  Filled 2019-01-08 (×10): qty 1

## 2019-01-08 MED ORDER — LORATADINE 10 MG PO TABS: 10.0000 mg | ORAL_TABLET | Freq: Every day | ORAL | Status: DC | PRN

## 2019-01-08 MED ORDER — METOPROLOL TARTRATE 25 MG PO TABS
12.5000 mg | ORAL_TABLET | Freq: Two times a day (BID) | ORAL | Status: DC
  Administered 2019-01-08 – 2019-01-11 (×7): 12.5 mg via ORAL
  Filled 2019-01-08 (×7): qty 1

## 2019-01-08 MED ORDER — POLYETHYLENE GLYCOL 3350 17 G PO PACK
17.0000 g | PACK | Freq: Every day | ORAL | Status: DC | PRN
  Administered 2019-01-15: 09:00:00 17 g via ORAL
  Filled 2019-01-08 (×2): qty 1

## 2019-01-08 MED ORDER — ZOLPIDEM TARTRATE 5 MG PO TABS
5.0000 mg | ORAL_TABLET | Freq: Every evening | ORAL | Status: DC | PRN
  Administered 2019-01-09 (×2): 5 mg via ORAL
  Filled 2019-01-08 (×2): qty 1

## 2019-01-08 MED ORDER — ONDANSETRON HCL 4 MG PO TABS: 4.0000 mg | ORAL_TABLET | Freq: Four times a day (QID) | ORAL | Status: DC | PRN

## 2019-01-08 MED ORDER — DEXAMETHASONE SODIUM PHOSPHATE 10 MG/ML IJ SOLN
6.0000 mg | INTRAMUSCULAR | Status: DC
  Administered 2019-01-08 – 2019-01-11 (×4): 6 mg via INTRAVENOUS
  Filled 2019-01-08 (×4): qty 1

## 2019-01-08 MED ORDER — ONDANSETRON HCL 4 MG/2ML IJ SOLN
4.0000 mg | Freq: Four times a day (QID) | INTRAMUSCULAR | Status: DC | PRN
  Administered 2019-01-08 – 2019-01-17 (×2): 4 mg via INTRAVENOUS
  Filled 2019-01-08 (×2): qty 2

## 2019-01-08 MED ORDER — LISINOPRIL 10 MG PO TABS
10.0000 mg | ORAL_TABLET | Freq: Every morning | ORAL | Status: DC
  Administered 2019-01-08 – 2019-01-10 (×3): 10 mg via ORAL
  Filled 2019-01-08 (×3): qty 1

## 2019-01-08 MED ORDER — ONDANSETRON HCL 4 MG/2ML IJ SOLN
4.0000 mg | Freq: Once | INTRAMUSCULAR | Status: AC
  Administered 2019-01-08: 4 mg via INTRAVENOUS
  Filled 2019-01-08: qty 2

## 2019-01-08 MED ORDER — ICAPS AREDS 2 PO CAPS: 1.0000 | ORAL_CAPSULE | Freq: Two times a day (BID) | ORAL | Status: DC

## 2019-01-08 MED ORDER — VITAMIN B-12 1000 MCG PO TABS
500.0000 ug | ORAL_TABLET | Freq: Every day | ORAL | Status: DC
  Administered 2019-01-08 – 2019-01-18 (×11): 500 ug via ORAL
  Filled 2019-01-08 (×11): qty 1

## 2019-01-08 MED ORDER — TRAMADOL HCL 50 MG PO TABS: 100.0000 mg | ORAL_TABLET | Freq: Two times a day (BID) | ORAL | Status: DC | PRN

## 2019-01-08 MED ORDER — ACETAMINOPHEN 500 MG PO TABS
1000.0000 mg | ORAL_TABLET | Freq: Once | ORAL | Status: AC
  Administered 2019-01-08: 1000 mg via ORAL
  Filled 2019-01-08: qty 2

## 2019-01-08 MED ORDER — GUAIFENESIN-DM 100-10 MG/5ML PO SYRP
10.0000 mL | ORAL_SOLUTION | ORAL | Status: DC | PRN
  Administered 2019-01-08 – 2019-01-13 (×4): 10 mL via ORAL
  Filled 2019-01-08 (×4): qty 10

## 2019-01-08 MED ORDER — HEPARIN SODIUM (PORCINE) 5000 UNIT/ML IJ SOLN
5000.0000 [IU] | Freq: Three times a day (TID) | INTRAMUSCULAR | Status: DC
  Administered 2019-01-08 – 2019-01-18 (×32): 5000 [IU] via SUBCUTANEOUS
  Filled 2019-01-08 (×32): qty 1

## 2019-01-08 MED ORDER — HYDROCOD POLST-CPM POLST ER 10-8 MG/5ML PO SUER
5.0000 mL | Freq: Two times a day (BID) | ORAL | Status: DC | PRN
  Administered 2019-01-13 – 2019-01-14 (×3): 5 mL via ORAL
  Filled 2019-01-08 (×4): qty 5

## 2019-01-08 MED ORDER — ACETAMINOPHEN 325 MG PO TABS
650.0000 mg | ORAL_TABLET | Freq: Four times a day (QID) | ORAL | Status: DC | PRN
  Administered 2019-01-09 – 2019-01-15 (×3): 650 mg via ORAL
  Filled 2019-01-08 (×4): qty 2

## 2019-01-08 MED ORDER — IPRATROPIUM-ALBUTEROL 20-100 MCG/ACT IN AERS
1.0000 | INHALATION_SPRAY | Freq: Four times a day (QID) | RESPIRATORY_TRACT | Status: DC
  Administered 2019-01-08 – 2019-01-09 (×6): 1 via RESPIRATORY_TRACT
  Filled 2019-01-08: qty 4

## 2019-01-08 MED ORDER — PANTOPRAZOLE SODIUM 40 MG PO TBEC
40.0000 mg | DELAYED_RELEASE_TABLET | Freq: Every day | ORAL | Status: DC
  Administered 2019-01-08 – 2019-01-18 (×11): 40 mg via ORAL
  Filled 2019-01-08 (×11): qty 1

## 2019-01-08 MED ORDER — PROSIGHT PO TABS
1.0000 | ORAL_TABLET | Freq: Every day | ORAL | Status: DC
  Administered 2019-01-08 – 2019-01-18 (×11): 1 via ORAL
  Filled 2019-01-08 (×11): qty 1

## 2019-01-08 MED ORDER — CLOPIDOGREL BISULFATE 75 MG PO TABS
75.0000 mg | ORAL_TABLET | Freq: Every day | ORAL | Status: DC
  Administered 2019-01-08 – 2019-01-18 (×11): 75 mg via ORAL
  Filled 2019-01-08 (×11): qty 1

## 2019-01-08 MED ORDER — SODIUM CHLORIDE 0.9 % IV SOLN
100.0000 mg | Freq: Every day | INTRAVENOUS | Status: AC
  Administered 2019-01-09 – 2019-01-12 (×4): 100 mg via INTRAVENOUS
  Filled 2019-01-08: qty 20
  Filled 2019-01-08 (×2): qty 100
  Filled 2019-01-08: qty 20
  Filled 2019-01-08: qty 100
  Filled 2019-01-08: qty 20

## 2019-01-08 MED ORDER — BRIMONIDINE TARTRATE 0.15 % OP SOLN
1.0000 [drp] | Freq: Three times a day (TID) | OPHTHALMIC | Status: DC
  Administered 2019-01-08 – 2019-01-18 (×30): 1 [drp] via OPHTHALMIC
  Filled 2019-01-08 (×2): qty 5

## 2019-01-08 MED ORDER — ATORVASTATIN CALCIUM 10 MG PO TABS
10.0000 mg | ORAL_TABLET | Freq: Every day | ORAL | Status: DC
  Administered 2019-01-08 – 2019-01-17 (×10): 10 mg via ORAL
  Filled 2019-01-08 (×10): qty 1

## 2019-01-08 MED ORDER — ASCORBIC ACID 500 MG PO TABS
500.0000 mg | ORAL_TABLET | Freq: Every day | ORAL | Status: DC
  Administered 2019-01-08 – 2019-01-18 (×11): 500 mg via ORAL
  Filled 2019-01-08 (×11): qty 1

## 2019-01-08 MED ORDER — LATANOPROST 0.005 % OP SOLN
1.0000 [drp] | Freq: Every day | OPHTHALMIC | Status: DC
  Administered 2019-01-08 – 2019-01-17 (×10): 1 [drp] via OPHTHALMIC
  Filled 2019-01-08 (×2): qty 2.5

## 2019-01-08 MED ORDER — HALOPERIDOL LACTATE 5 MG/ML IJ SOLN
2.0000 mg | Freq: Four times a day (QID) | INTRAMUSCULAR | Status: DC | PRN
  Administered 2019-01-08 – 2019-01-10 (×3): 2 mg via INTRAVENOUS
  Filled 2019-01-08 (×3): qty 1

## 2019-01-08 MED ORDER — SODIUM CHLORIDE 0.9 % IV SOLN
200.0000 mg | Freq: Once | INTRAVENOUS | Status: AC
  Administered 2019-01-08: 200 mg via INTRAVENOUS
  Filled 2019-01-08: qty 200

## 2019-01-08 MED ORDER — CENTRUM PO CHEW
1.0000 | CHEWABLE_TABLET | Freq: Every morning | ORAL | Status: DC
  Administered 2019-01-08 – 2019-01-12 (×5): 1 via ORAL
  Filled 2019-01-08 (×6): qty 1

## 2019-01-08 MED ORDER — SERTRALINE HCL 50 MG PO TABS
50.0000 mg | ORAL_TABLET | Freq: Every day | ORAL | Status: DC
  Administered 2019-01-08 – 2019-01-18 (×11): 50 mg via ORAL
  Filled 2019-01-08 (×11): qty 1

## 2019-01-08 NOTE — ED Provider Notes (Signed)
TIME SEEN: 3:13 AM  CHIEF COMPLAINT: cough, vomiting  HPI: Patient is a 85 year old female who presents to the emergency department with complaints of fevers (101), cough, nausea, vomiting, decreased appetite since Wednesday, December 16.  States she lives at home alone but lives near her daughter.  Multiple family members are Covid positive.  Patient is presumed to have Covid but has not been tested.  Spoke with patient's daughter by phone who states the patient had a sat of 88% at home.  Does not wear oxygen chronically.  Contacted PCP at [facility] who recommended patient be admitted to the hospital.  ROS: See HPI Constitutional:  fever  Eyes: no drainage  ENT: no runny nose   Cardiovascular:  no chest pain  Resp: no SOB  GI:  vomiting GU: no dysuria Integumentary: no rash  Allergy: no hives  Musculoskeletal: no leg swelling  Neurological: no slurred speech ROS otherwise negative  PAST MEDICAL HISTORY/PAST SURGICAL HISTORY:  Past Medical History:  Diagnosis Date  . Atrial fibrillation ([place])   . Bruises easily   . Cancer ([place]) 1998   left tamoxifem x 5 yrs  . Coronary artery disease    CARDIOLOGIST-  DR Geraldo Pitter ([facility])  . Endometrial cancer ([place])   . Endometrial cancer ([place])   . Fall 06/2017   Patient fell and hit her head , patient's daughter's states patient may have fell on the kitchen counter  . GERD (gastroesophageal reflux disease)   . Glaucoma, both eyes   . History of breast cancer    1998--  s/p  left mastectomy with sln dissection's  ,  no chemo or radiation-  no recurrence  . History of Clostridium difficile infection 5-6 yrs ago  . History of TIAs 12/22/2015   yrs ago ? AND QUESTIONABLE ONE YRS AGO  . Hyperlipidemia   . Hypertension   . Macular degeneration   . Narrow complex tachycardia [facility])    cardiologist-  dr Salley Scarlet  . OA (osteoarthritis)    right knee  . PMB (postmenopausal bleeding)   . PONV (postoperative nausea  and vomiting)    ponv after appendectomy 1954, DID WELL AFTER D AND C RECENT  . Thickened endometrium   . Wears glasses     MEDICATIONS:  Prior to Admission medications   Medication Sig Start Date End Date Taking? Authorizing Provider  acetaminophen (TYLENOL) 500 MG tablet Take 500 mg by mouth every 6 (six) hours as needed for moderate pain or headache.    [provider]  amiodarone (PACERONE) 200 MG tablet Take 0.5 tablets (100 mg total) by mouth every morning. 04/10/18   Revankar, Reita Cliche, MD  atorvastatin (LIPITOR) 10 MG tablet Take 1 tablet (10 mg total) by mouth at bedtime. 03/29/17   Revankar, Reita Cliche, MD  betamethasone acetate-betamethasone sodium phosphate (CELESTONE) 6 (3-3) MG/ML injection Inject 12 mg into the articular space every 3 (three) months. Next injection due end of January 08/07/15   [provider]  bimatoprost (LUMIGAN) 0.01 % SOLN Place 1 drop into both eyes at bedtime.    [provider]  brimonidine (ALPHAGAN P) 0.1 % SOLN Place 1 drop into both eyes every 8 (eight) hours.  03/12/13   [provider]  clopidogrel (PLAVIX) 75 MG tablet Take 1 tablet (75 mg total) by mouth daily. Patient taking differently: Take 75 mg by mouth daily. Will stop prior to surgery on 01-17-16 and will start 81 mg Aspirin 12/24/15   Ghimire, [employer]  M, MD  cyanocobalamin (V-R VITAMIN B-12) 500 MCG tablet Take 500 mcg by mouth daily.     [provider]  ibuprofen (ADVIL,MOTRIN) 600 MG tablet Take 1 tablet (600 mg total) by mouth every 6 (six) hours as needed (mild pain). 01/28/16   Everitt Amber, MD  lisinopril (PRINIVIL,ZESTRIL) 10 MG tablet Take 10 mg by mouth every morning.     [provider]  loratadine (CLARITIN) 10 MG tablet Take 10 mg by mouth daily as needed for allergies.    [provider]  Melatonin 10 MG CAPS Take 10 mg by mouth at bedtime.     [provider]  metoprolol tartrate (LOPRESSOR) 25 MG tablet Take 12.5  mg by mouth 2 (two) times daily.     [provider]  Multiple Vitamins-Minerals (CENTRUM VITAMINTS PO) Take 1 tablet by mouth every morning.    [provider]  Multiple Vitamins-Minerals (ICAPS AREDS 2) CAPS Take 1 tablet by mouth 2 (two) times daily.     [provider]  omeprazole (PRILOSEC) 20 MG capsule Take 20 mg by mouth every morning. 08/28/15   [provider]  sertraline (ZOLOFT) 50 MG tablet Take 50 mg by mouth daily.    [provider]    ALLERGIES:  Allergies  Allergen Reactions  . Ativan [Lorazepam]     "HALLUCINATIONS"  . Flagyl [Metronidazole] Rash    SOCIAL HISTORY:  Social History   Tobacco Use  . Smoking status: Never Smoker  . Smokeless tobacco: Never Used  Substance Use Topics  . Alcohol use: No    Comment: occasional    FAMILY HISTORY: Family History  Problem Relation Age of Onset  . Hypertension Mother   . Hypertension Father   . Liver cancer Brother   . Colon cancer Brother     EXAM: BP (!) 160/68   Pulse 83   Temp 99.3 F (37.4 C) (Oral)   Resp 20   Ht 5\' 5"  (1.651 m)   Wt 56.7 kg   SpO2 96%   BMI 20.80 kg/m  CONSTITUTIONAL: Alert and oriented and responds appropriately to questions.  Elderly, thin HEAD: Normocephalic EYES: Conjunctivae clear, pupils appear equal, EOM appear intact ENT: normal nose; moist mucous membranes NECK: Supple, normal ROM CARD: RRR; S1 and S2 appreciated; no murmurs, no clicks, no rubs, no gallops RESP: Normal chest excursion without splinting or tachypnea; breath sounds clear and equal bilaterally; no wheezes, no rhonchi, no rales, no hypoxia or respiratory distress, speaking full sentences ABD/GI: Normal bowel sounds; non-distended; soft, non-tender, no rebound, no guarding, no peritoneal signs, no hepatosplenomegaly BACK:  The back appears normal EXT: Normal ROM in all joints; no deformity noted, no edema; no cyanosis SKIN: Normal color for age and race; warm; no  rash on exposed skin NEURO: Moves all extremities equally PSYCH: The patient's mood and manner are appropriate.   MEDICAL DECISION MAKING: Patient here with likely Covid infection.  Sats of 88% at home per daughter.  Family and PCP concerned and requesting admission.  She is not hypoxic here and has no increased work of breathing, respiratory distress.  Will obtain labs, urine, chest x-ray and swab for Covid.  Will discuss with hospitalist for admission.  She has no chest pain or shortness of breath currently.  ED PROGRESS: Labs unremarkable.  Mild elevation of inflammatory markers but normal lactate and pro calcitonin.  Chest x-ray shows a viral pattern of pneumonia.  No hypoxia or increased work of breathing here in  the ED but again did have sats of 88% at home.  Daughter very concerned and reports PCP Dr. Laurann Montana wanted patient admitted to the hospital for IV remdesivir and Decadron.  Will discuss with hospitalist.   5:35 AM Discussed patient's case with hospitalist, Dr. Hal Hope.  Hospitalist will admit.  Admitting physician will place admission orders.   I reviewed all nursing notes, vitals, pertinent previous records and interpreted all EKGs, lab and urine results, imaging (as available).      EKG Interpretation  Date/Time:  Monday January 08 2019 03:39:36 EST Ventricular Rate:  83 PR Interval:  214 QRS Duration: 90 QT Interval:  398 QTC Calculation: 467 R Axis:   27 Text Interpretation: Sinus rhythm with 1st degree A-V block Otherwise normal ECG No significant change since last tracing Confirmed by Pryor Curia [phone]) on 01/08/2019 3:44:08 AM           [name] was evaluated in Emergency Department on 01/08/2019 for the symptoms described in the history of present illness. She was evaluated in the context of the global COVID-19 pandemic, which necessitated consideration that the patient might be at risk for infection with the SARS-CoV-2 virus that causes  COVID-19. Institutional protocols and algorithms that pertain to the evaluation of patients at risk for COVID-19 are in a state of rapid change based on information released by regulatory bodies including the CDC and federal and state organizations. These policies and algorithms were followed during the patient's care in the ED.  Patient was seen wearing N95, face shield, gloves, gown.    Laban Orourke, Delice Bison, DO 01/08/19 [phone]

## 2019-01-08 NOTE — H&P (Signed)
History and Physical    Sheri Hensley [id number] DOB: 01-09-1920 DOA: 01/08/2019  PCP: Josetta Huddle, MD   Patient coming from: Home  Chief Complaint: Cough, Vomiting, Decreased Appetite and Fevers  HPI: Sheri Hensley is a 85 y.o. female with PMH significant for but not limited too paroxysmal atrial fibrillation history of left-sided breast cancer status postmastectomy and tamoxifen for 5 years along with endometrial cancer, coronary artery disease seen by Dr. Geraldo Pitter in [place], GERD, glaucoma of both eyes and macular degeneration, hypertension, hyperlipidemia, osteoarthritis, and history of narrow complex tachycardia as well as history of TIAs and C. difficile infection along with other comorbidities who presents with a chief complaint of fevers, cough as well as nausea and vomiting along with decreased appetite since January 03, 2019.  Patient states that she had some friends visit from [place] and out of town and did not realize that been exposed.  States that she developed a cough about 4 days ago and states it is a "hacking cough" but then had a productive sputum a few days ago  She also had some inner mitten fevers hought she was initially getting better but then started having some nausea and vomiting and had some diarrhea last night.  Patient currently lives alone but lives near her daughter and multiple family members have tested positive including her daughter but she had not been tested.  At home she has saturation of 88% and does not wear oxygen and She contacted her primary care physician who recommended the patient come to the hospital for further evaluation so EMS was called.  [facility] was asked to admit this patient for acute hypoxic respiratory failure in the setting of COVID-19 disease and pneumonia  ED Course: In the emergency room she had basic blood work done including a CBC and a CMP along with an EKG and a chest x-ray which showed an atypical pattern of pneumonia.   Inflammatory markers were checked which were elevated.  She was given acetaminophen and ondansetron and blood cultures were obtained  Review of Systems: As per HPI otherwise all other systems reviewed and negative.   Past Medical History:  Diagnosis Date  . Atrial fibrillation ([place])   . Bruises easily   . Cancer ([place]) 1998   left tamoxifem x 5 yrs  . Coronary artery disease    CARDIOLOGIST-  DR Geraldo Pitter ([facility])  . Endometrial cancer ([place])   . Endometrial cancer ([place])   . Fall 06/2017   Patient fell and hit her head , patient's daughter's states patient may have fell on the kitchen counter  . GERD (gastroesophageal reflux disease)   . Glaucoma, both eyes   . History of breast cancer    1998--  s/p  left mastectomy with sln dissection's  ,  no chemo or radiation-  no recurrence  . History of Clostridium difficile infection 5-6 yrs ago  . History of TIAs 12/22/2015   yrs ago ? AND QUESTIONABLE ONE YRS AGO  . Hyperlipidemia   . Hypertension   . Macular degeneration   . Narrow complex tachycardia [facility])    cardiologist-  dr Salley Scarlet  . OA (osteoarthritis)    right knee  . PMB (postmenopausal bleeding)   . PONV (postoperative nausea and vomiting)    ponv after appendectomy 1954, DID WELL AFTER D AND C RECENT  . Thickened endometrium   . Wears glasses     Past Surgical History:  Procedure Laterality Date  . APPENDECTOMY  1954  .  CATARACT EXTRACTION W/ INTRAOCULAR LENS  IMPLANT, BILATERAL  1994  . colonscopy    . DILATION AND CURETTAGE OF UTERUS N/A 11/20/2015   Procedure: DILATATION AND CURETTAGE;  Surgeon: Everitt Amber, MD;  Location: [facility];  Service: Gynecology;  Laterality: N/A;  . DILATION AND CURETTAGE, DIAGNOSTIC / THERAPEUTIC N/A novmber 2017   per daughter with biopsy as stated  . KNEE ARTHROSCOPY Right 2002  . MASTECTOMY Left 1998   w/ lympn node dissection's  . ROBOTIC ASSISTED TOTAL HYSTERECTOMY WITH BILATERAL SALPINGO  OOPHERECTOMY Bilateral 01/27/2016   Procedure: XI ROBOTIC ASSISTED TOTAL HYSTERECTOMY WITH BILATERAL SALPINGO OOPHORECTOMY;  Surgeon: Everitt Amber, MD;  Location: [facility];  Service: Gynecology;  Laterality: Bilateral;   SOCIAL HISTORY  reports that she has never smoked. She has never used smokeless tobacco. She reports that she does not drink alcohol or use drugs.  Allergies  Allergen Reactions  . Ativan [Lorazepam]     "HALLUCINATIONS"  . Flagyl [Metronidazole] Rash   Family History  Problem Relation Age of Onset  . Hypertension Mother   . Hypertension Father   . Liver cancer Brother   . Colon cancer Brother    Prior to Admission medications   Medication Sig Start Date End Date Taking? Authorizing Provider  acetaminophen (TYLENOL) 500 MG tablet Take 500 mg by mouth every 6 (six) hours as needed for moderate pain or headache.    [provider]  amiodarone (PACERONE) 200 MG tablet Take 0.5 tablets (100 mg total) by mouth every morning. 04/10/18   Revankar, Reita Cliche, MD  atorvastatin (LIPITOR) 10 MG tablet Take 1 tablet (10 mg total) by mouth at bedtime. 03/29/17   Revankar, Reita Cliche, MD  betamethasone acetate-betamethasone sodium phosphate (CELESTONE) 6 (3-3) MG/ML injection Inject 12 mg into the articular space every 3 (three) months. Next injection due end of January 08/07/15   [provider]  bimatoprost (LUMIGAN) 0.01 % SOLN Place 1 drop into both eyes at bedtime.    [provider]  brimonidine (ALPHAGAN P) 0.1 % SOLN Place 1 drop into both eyes every 8 (eight) hours.  03/12/13   [provider]  clopidogrel (PLAVIX) 75 MG tablet Take 1 tablet (75 mg total) by mouth daily. Patient taking differently: Take 75 mg by mouth daily. Will stop prior to surgery on 01-17-16 and will start 81 mg Aspirin 12/24/15   Ghimire, Henreitta Leber, MD  cyanocobalamin (V-R VITAMIN B-12) 500 MCG tablet Take 500 mcg by mouth daily.     [provider]  ibuprofen  (ADVIL,MOTRIN) 600 MG tablet Take 1 tablet (600 mg total) by mouth every 6 (six) hours as needed (mild pain). 01/28/16   Everitt Amber, MD  lisinopril (PRINIVIL,ZESTRIL) 10 MG tablet Take 10 mg by mouth every morning.     [provider]  loratadine (CLARITIN) 10 MG tablet Take 10 mg by mouth daily as needed for allergies.    [provider]  Melatonin 10 MG CAPS Take 10 mg by mouth at bedtime.     [provider]  metoprolol tartrate (LOPRESSOR) 25 MG tablet Take 12.5 mg by mouth 2 (two) times daily.     [provider]  Multiple Vitamins-Minerals (CENTRUM VITAMINTS PO) Take 1 tablet by mouth every morning.    [provider]  Multiple Vitamins-Minerals (ICAPS AREDS 2) CAPS Take 1 tablet by mouth 2 (two) times daily.     [provider]  omeprazole (PRILOSEC) 20 MG capsule Take 20 mg by  mouth every morning. 08/28/15   [provider]  sertraline (ZOLOFT) 50 MG tablet Take 50 mg by mouth daily.    [provider]   Physical Exam: Vitals:   01/08/19 0330 01/08/19 0430 01/08/19 0600 01/08/19 0845  BP: (!) 117/100 138/61 102/83 106/65  Pulse: 78 76 80 76  Resp: (!) 24 19 18  (!) 22  Temp:    99.2 F (37.3 C)  TempSrc:    Oral  SpO2: 94% 92% 94% 96%  Weight:    53.4 kg  Height:    5\' 5"  (1.651 m)   Constitutional: Thin Elderly Caucasian female who appears anxious Eyes: Lids and conjunctivae normal, sclerae anicteric  ENMT: External Ears, Nose appear normal. Slightly hard of hearing. Mucous membranes are moist.  Neck: Appears normal, supple, no cervical masses, normal ROM, no appreciable thyromegaly; no JVD Respiratory: Diminished to auscultation bilaterally with coarse breath sounds, no wheezing, rales, rhonchi or crackles. Normal respiratory effort and patient is not tachypenic. No accessory muscle use. No longer wearing supplemental O2 via [place] and Saturations were 96%. Cardiovascular: RRR, no murmurs / rubs / gallops. S1 and  S2 auscultated. No extremity edema.  Abdomen: Soft, non-tender, non-distended.  Bowel sounds positive x4.  GU: Deferred. Musculoskeletal: No clubbing / cyanosis of digits/nails. No joint deformity upper and lower extremities.  Skin: No rashes, lesions, ulcers on a limited skin evaluation but has had some seborrheic Keratosis this AM. No induration; Warm and dry.  Neurologic: CN 2-12 grossly intact with no focal deficits. Romberg sign and cerebellar reflexes not assessed.  Psychiatric: Normal judgment and insight. Alert and oriented x 3. Anxious mood and appropriate affect.   Labs on Admission: I have personally reviewed following labs and imaging studies  CBC: Recent Labs  Lab 01/08/19 0358  WBC 5.7  NEUTROABS 5.1  HGB 12.7  HCT 39.8  MCV 93.6  PLT [id number]*   Basic Metabolic Panel: Recent Labs  Lab 01/08/19 0358  NA 140  K 3.7  CL 109  CO2 22  GLUCOSE 136*  BUN 26*  CREATININE 1.05*  CALCIUM 9.0   GFR: Estimated Creatinine Clearance: 25.8 mL/min (A) (by C-G formula based on SCr of 1.05 mg/dL (H)). Liver Function Tests: Recent Labs  Lab 01/08/19 0358  AST 28  ALT 19  ALKPHOS 94  BILITOT 0.6  PROT 7.4  ALBUMIN 4.1   No results for input(s): LIPASE, AMYLASE in the last 168 hours. No results for input(s): AMMONIA in the last 168 hours. Coagulation Profile: No results for input(s): INR, PROTIME in the last 168 hours. Cardiac Enzymes: No results for input(s): CKTOTAL, CKMB, CKMBINDEX, TROPONINI in the last 168 hours. BNP (last 3 results) No results for input(s): PROBNP in the last 8760 hours. HbA1C: No results for input(s): HGBA1C in the last 72 hours. CBG: No results for input(s): GLUCAP in the last 168 hours. Lipid Profile: Recent Labs    01/08/19 0358  TRIG 201*   Thyroid Function Tests: No results for input(s): TSH, T4TOTAL, FREET4, T3FREE, THYROIDAB in the last 72 hours. Anemia Panel: Recent Labs    01/08/19 0358  FERRITIN 237   Urine analysis:      Component Value Date/Time   COLORURINE AMBER (A) 01/21/2016 1104   APPEARANCEUR HAZY (A) 01/21/2016 1104   LABSPEC 1.019 01/21/2016 1104   PHURINE 5.0 01/21/2016 [place] 01/21/2016 1104   HGBUR NEGATIVE 01/21/2016 [place] 01/21/2016 1104   [place] 01/21/2016 1104  PROTEINUR NEGATIVE 01/21/2016 1104   NITRITE NEGATIVE 01/21/2016 1104   LEUKOCYTESUR TRACE (A) 01/21/2016 1104   Sepsis Labs: !!!!!!!!!!!!!!!!!!!!!!!!!!!!!!!!!!!!!!!!!!!! @LABRCNTIP (procalcitonin:4,lacticidven:4) )No results found for this or any previous visit (from the past 240 hour(s)).   Radiological Exams on Admission: DG Chest Port 1 View  Result Date: 01/08/2019 CLINICAL DATA:  Probable COVID.  Fever and hypoxia EXAM: PORTABLE CHEST 1 VIEW COMPARISON:  12/15/2015 FINDINGS: Patchy bilateral pulmonary infiltrate. Normal heart size and stable aortic tortuosity. No edema, effusion, or pneumothorax when accounting for skin folds. Postoperative left breast and axilla. IMPRESSION: Atypical pneumonia pattern. Electronically Signed   By: Monte Fantasia M.D.   On: 01/08/2019 04:20   EKG: Independently reviewed.  Showed a sinus rhythm at a rate of 83 with a first-degree AV block with a prolonged PR interval and a QTC level 467 with no evidence of ST elevation or depression on my interpretation  Assessment/Plan Active Problems:   Endometrial cancer (HCC)   TIA (transient ischemic attack)   Essential hypertension   Paroxysmal atrial fibrillation (HCC)   Acute respiratory failure with hypoxia (HCC)   COVID-19 virus infection   Pneumonia due to COVID-19 virus  Acute Respiratory Failure with Hypoxia in the setting of COVID-19 disease and Covid Pneumonia -Admit to inpatient telemetry -Patient's chest x-ray showed an atypical pneumonia pattern -Of note she presents with a fever of 101 at home, cough, nausea, vomiting, decreased appetite since 16 December; has been exposed to  multiple family members that have been tested positive for Covid On presentation she had saturation of 88% at home does not chronically wear oxygen -She tested positive here in the point-of-care testing and will continue airborne and contact precautions -She was given a gram of Tylenol in the ED -Inflammatory markers were checked and showed an LDH of 198, triglycerides of 201, ferritin level 237, CRP of 9.3, lactic acid level of 1.1, procalcitonin level of less than 0.10, a D-dimer of 0.62 and a fibrinogen of 569 -Continue to monitor and trend inflammatory markers daily and inflammatory marker trend is as follows: Recent Labs    01/08/19 0358  DDIMER 0.62*  FERRITIN 237  LDH 198*  CRP 9.3*    No results found for: SARSCOV2NAA; POC SARS CORONOAVIRUS 2 + -SpO2: 96 %; Was placed on O2 initially but now weaned off -Recommend proning much as possible -We will give the patient dexamethasone 6 mg IV daily for 10 days -Also will start Remdesivir 5 days total with pharmacy to dose -Start the patient on inhalers with Combivent scheduled and add albuterol inhaler as needed -We will give symptomatic treatment and continue with antitussives with -Blood cultures x2 have been drawn and are still pending -Continue to maintain euvolemia and if possible slightly drier status -Repeat chest x-ray in a.m. and continue to monitor patient's clinical response to intervention and follow her daily inflammatory marker trend and if not improving may consider Actemra and or addition of convalescent plasma -Continuous pulse oximetry and maintain O2 saturations greater than 90% Continue supplemental oxygen via nasal cannula and wean O2 as tolerated -We will also add zinc and vitamin C -C/w Antiemetics for Nausea and Vomiting -We will hold her NSAIDs of ibuprofen  Generalized Weakness -In the setting of COVID Disease -Obtain PT/OT to Evaluate and Treat   Thrombocytopenia -Patient's platelet count on admission  was 112,000, continue to monitor for signs and symptoms of bleeding; currently no overt bleeding noted -Repeat CBC in a.m.  Hyperglycemia -Likely reactive however expect to be worsened  in the setting of IV steroid demargination -Last hemoglobin A1c was 5.6 back in 2017 -Patient's blood sugar on admission was 136  -Continue Covid glycemic control protocol with  Renal Insufficiency/CKD Stage 3 -Patient's BUN/creatinine on admission was 26/1.05; Appears Baseline from prior results noted this year as Cr was 1.26 back in January  -We will continue her lisinopril but avoid further nephrotoxic medications if possible -Avoid nephrotoxic medications, contrast dyes, hypotension and renally adjust medications -Continue to monitor and trend renal function Repeat CMP in a.m.  Paroxsymal Atrial Fibrillation and history of Narrow Complex Tachycardia -Patient takes Amiodarone 200 mg p.o. daily which we will continue along with her Metoprolol Tartrate 12.5 mg p.o. twice daily -Not on any anticoagulation per medical record -We will continue telemetry monitoring  History of CAD -Sees Dr. Geraldo Pitter of Cardiology -Continue with Atorvastatin 10 mg p.o. daily, Clopidogrel 75 mg p.o. daily, Lisinopril 10 mg p.o. every morning, Metoprolol tartrate 12.5 mg p.o. twice daily  History of Endometrial Cancer as well as Breast Cancer -Patient underwent a left mastectomy with SLN dissections with no chemotherapy or radiation -Has been on tamoxifen for 5 years which has now been stopped  Hypertension -Continue with her lisinopril 10 mg p.o. daily along with her metoprolol tartrate 12.5 mg p.o. twice daily  Hyperlipidemia -Continue with atorvastatin 10 mg p.o. daily  GERD  -Continue with omeprazole substitution with pantoprazole 40 mg daily  History of TIAs -Continue with clopidogrel 75 mg p.o. daily and with atorvastatin 10 mg p.o. daily  History of C. Difficile Infection -Stable we will continue monitor -Do  not clinically feel that she has a C. difficile infection  Glaucoma, both eyes as well as macular degeneration -Continue with beta-blocker Prost 1 drop in both eyes nightly as well as brimonidine 1 drop into both eyes every 8 hours  GOC: Had a lengthy Discussion with the patient and at the end of the discussion patient concluded to be a DNR  DVT prophylaxis: Enoxaparin 40 mg sq q24h Code Status: DO NOT RESUSCITATE Family Communication: Discussed with patient's daughter over the telephone Disposition Plan:  Consults called: None Admission status: Inpatient Telemetry   Severity of Illness: The appropriate patient status for this patient is INPATIENT. Inpatient status is judged to be reasonable and necessary in order to provide the required intensity of service to ensure the patient's safety. The patient's presenting symptoms, physical exam findings, and initial radiographic and laboratory data in the context of their chronic comorbidities is felt to place them at high risk for further clinical deterioration. Furthermore, it is not anticipated that the patient will be medically stable for discharge from the hospital within 2 midnights of admission. The following factors support the patient status of inpatient.   " The patient's presenting symptoms include cough, fever, nausea and vomiting, and fatigue . " The worrisome physical exam findings include diminished breath sounds. " The initial radiographic and laboratory data are worrisome because of atypical PNA and elevated inflammatory markers. " The chronic co-morbidities are listed as above.   * I certify that at the point of admission it is my clinical judgment that the patient will require inpatient hospital care spanning beyond 2 midnights from the point of admission due to high intensity of service, high risk for further deterioration and high frequency of surveillance required.Kerney Elbe, D.O. [facility] PAGER is on  [place]  If 7PM-7AM, please contact night-coverage [email or web address]  01/08/2019, 10:04 AM

## 2019-01-08 NOTE — ED Triage Notes (Signed)
Pt arrived via EMS. Pt is Covid positive, and has a low grade fever and shortness of breath on exertion.

## 2019-01-08 NOTE — ED Notes (Signed)
X-ray at bedside

## 2019-01-09 ENCOUNTER — Inpatient Hospital Stay (HOSPITAL_COMMUNITY): Payer: Medicare HMO

## 2019-01-09 LAB — PHOSPHORUS: Phosphorus: 3.6 mg/dL (ref 2.5–4.6)

## 2019-01-09 LAB — CBC WITH DIFFERENTIAL/PLATELET
Abs Immature Granulocytes: 0.03 10*3/uL (ref 0.00–0.07)
Basophils Absolute: 0 10*3/uL (ref 0.0–0.1)
Basophils Relative: 0 %
Eosinophils Absolute: 0 10*3/uL (ref 0.0–0.5)
Eosinophils Relative: 0 %
HCT: 36.4 % (ref 36.0–46.0)
Hemoglobin: 11.1 g/dL — ABNORMAL LOW (ref 12.0–15.0)
Immature Granulocytes: 1 %
Lymphocytes Relative: 9 %
Lymphs Abs: 0.4 10*3/uL — ABNORMAL LOW (ref 0.7–4.0)
MCH: 28.8 pg (ref 26.0–34.0)
MCHC: 30.5 g/dL (ref 30.0–36.0)
MCV: 94.3 fL (ref 80.0–100.0)
Monocytes Absolute: 0.3 10*3/uL (ref 0.1–1.0)
Monocytes Relative: 6 %
Neutro Abs: 3.4 10*3/uL (ref 1.7–7.7)
Neutrophils Relative %: 84 %
Platelets: 107 10*3/uL — ABNORMAL LOW (ref 150–400)
RBC: 3.86 MIL/uL — ABNORMAL LOW (ref 3.87–5.11)
RDW: 15.2 % (ref 11.5–15.5)
WBC: 4.1 10*3/uL (ref 4.0–10.5)
nRBC: 0 % (ref 0.0–0.2)

## 2019-01-09 LAB — SEDIMENTATION RATE: Sed Rate: 98 mm/hr — ABNORMAL HIGH (ref 0–22)

## 2019-01-09 LAB — COMPREHENSIVE METABOLIC PANEL
ALT: 17 U/L (ref 0–44)
AST: 25 U/L (ref 15–41)
Albumin: 3.4 g/dL — ABNORMAL LOW (ref 3.5–5.0)
Alkaline Phosphatase: 74 U/L (ref 38–126)
Anion gap: 10 (ref 5–15)
BUN: 30 mg/dL — ABNORMAL HIGH (ref 8–23)
CO2: 20 mmol/L — ABNORMAL LOW (ref 22–32)
Calcium: 8.8 mg/dL — ABNORMAL LOW (ref 8.9–10.3)
Chloride: 111 mmol/L (ref 98–111)
Creatinine, Ser: 0.84 mg/dL (ref 0.44–1.00)
GFR calc Af Amer: >60 mL/min (ref >60)
GFR calc non Af Amer: 58 mL/min — ABNORMAL LOW (ref >60)
Glucose, Bld: 116 mg/dL — ABNORMAL HIGH (ref 70–99)
Potassium: 3.8 mmol/L (ref 3.5–5.1)
Sodium: 141 mmol/L (ref 135–145)
Total Bilirubin: 0.8 mg/dL (ref 0.3–1.2)
Total Protein: 6.3 g/dL — ABNORMAL LOW (ref 6.5–8.1)

## 2019-01-09 LAB — C-REACTIVE PROTEIN: CRP: 10.9 mg/dL — ABNORMAL HIGH (ref <1.0)

## 2019-01-09 LAB — GLUCOSE, CAPILLARY
Glucose-Capillary: 107 mg/dL — ABNORMAL HIGH (ref 70–99)
Glucose-Capillary: 112 mg/dL — ABNORMAL HIGH (ref 70–99)
Glucose-Capillary: 123 mg/dL — ABNORMAL HIGH (ref 70–99)
Glucose-Capillary: 125 mg/dL — ABNORMAL HIGH (ref 70–99)
Glucose-Capillary: 139 mg/dL — ABNORMAL HIGH (ref 70–99)
Glucose-Capillary: 90 mg/dL (ref 70–99)

## 2019-01-09 LAB — D-DIMER, QUANTITATIVE: D-Dimer, Quant: 0.38 ug/mL-FEU (ref 0.00–0.50)

## 2019-01-09 LAB — MAGNESIUM: Magnesium: 2 mg/dL (ref 1.7–2.4)

## 2019-01-09 LAB — FIBRINOGEN: Fibrinogen: 560 mg/dL — ABNORMAL HIGH (ref 210–475)

## 2019-01-09 LAB — FERRITIN: Ferritin: 223 ng/mL (ref 11–307)

## 2019-01-09 NOTE — Progress Notes (Signed)
PROGRESS NOTE  Sheri Hensley [id number] DOB: 27-Dec-1919 DOA: 01/08/2019 PCP: Josetta Huddle, MD   LOS: 1 day   Brief Narrative / Interim history: 85 year old female with history of PAF, breast cancer status postmastectomy on tamoxifen, endometrial cancer, CAD, glaucoma, HTN, HLD, narrow complex tachycardia, prior TIAs, prior C. difficile infection came into the hospital with fevers, cough, nausea and vomiting along with decreased appetite for the past week.  She came in contact with some friends from [place] and potentially was exposed to Covid at that time.  She is also been complaining of a cough for the past 4 days.  Chest x-ray on admission consistent with atypical pneumonia pattern.  She was placed on remdesivir and Decadron and admitted to the hospital  Subjective / 24h Interval events: Worsening delirium overnight, patient this morning is confused, has no complaints, denies any shortness of breath but is not sure why she is here  Assessment & Plan:  Principal Problem Acute Hypoxic Respiratory Failure due to Covid-19 Viral Illness -Respiratory status is stable, she is in the low 90s on room air this morning -Monitor inflammatory markers, CRP 10.9 -She was started on remdesivir which is scheduled to be done on 12/25, also started on Decadron -Thankfully she is on room air this morning, continue treatment as above and closely monitor   COVID-19 Labs  Recent Labs    01/08/19 0358 01/09/19 0327  DDIMER 0.62* 0.38  FERRITIN 237 223  LDH 198*  --   CRP 9.3* 10.9*    No results found for: SARSCOV2NAA  Active Problems Chronic kidney disease stage III -Baseline creatinine around 1.2, currently at baseline -Continue lisinopril as she seems to be stable on that, avoid further nephrotoxins  Paroxysmal A. fib, history of narrow complex tachycardia -Continue amiodarone, continue metoprolol.  She does not appear to be on anticoagulation per medical  record  History of CAD -Continue statin, Plavix, ACE inhibitor and beta-blocker  History of endometrial and breast cancer -Outpatient follow-up  In-hospital delirium -Patient with episode of sundowning last night, possibly due to steroids.  I wonder whether she has underlying dementia.  Continue close monitoring, frequent reorientation  Thrombocytopenia -Chronic, perhaps slightly worsening now due to viral illness, monitor  Scheduled Meds: . amiodarone  100 mg Oral q morning - 10a  . vitamin C  500 mg Oral Daily  . atorvastatin  10 mg Oral QHS  . brimonidine  1 drop Both Eyes Q8H  . clopidogrel  75 mg Oral Daily  . dexamethasone (DECADRON) injection  6 mg Intravenous Q24H  . heparin injection (subcutaneous)  5,000 Units Subcutaneous Q8H  . insulin aspart  0-9 Units Subcutaneous Q4H  . Ipratropium-Albuterol  1 puff Inhalation Q6H  . latanoprost  1 drop Both Eyes QHS  . lisinopril  10 mg Oral q morning - 10a  . metoprolol tartrate  12.5 mg Oral BID  . multivitamin  1 tablet Oral Daily  . multivitamin-iron-minerals-folic acid  1 tablet Oral q morning - 10a  . pantoprazole  40 mg Oral Daily  . sertraline  50 mg Oral Daily  . vitamin B-12  500 mcg Oral Daily  . zinc sulfate  220 mg Oral Daily   Continuous Infusions: . remdesivir 100 mg in NS 100 mL 100 mg (01/09/19 0941)   PRN Meds:.acetaminophen, albuterol, chlorpheniramine-HYDROcodone, guaiFENesin-dextromethorphan, haloperidol lactate, loratadine, ondansetron **OR** ondansetron (ZOFRAN) IV, polyethylene glycol, traMADol, zolpidem  DVT prophylaxis: Heparin Code Status: DNR Family Communication: daughter Tiffany Kocher [phone] updated 12/22 Disposition  Plan: home when ready   Consultants:  None   Procedures:  None   Microbiology: Blood cultures 12/21 - NGTD  Antimicrobials: Remdesivir    Objective: Vitals:   01/08/19 1938 01/08/19 2116 01/09/19 0030 01/09/19 0535  BP: (!) 164/74 (!) 186/74 117/68 (!) 148/77   Pulse: 64 72 74 61  Resp: (!) 22  (!) 21 18  Temp: 97.6 F (36.4 C)   97.8 F (36.6 C)  TempSrc: Oral   Oral  SpO2: 93%  94% 93%  Weight:      Height:        Intake/Output Summary (Last 24 hours) at 01/09/2019 0948 Last data filed at 01/08/2019 1600 Gross per 24 hour  Intake 143.42 ml  Output --  Net 143.42 ml   Filed Weights   01/08/19 0248 01/08/19 0845  Weight: 56.7 kg 53.4 kg    Examination: Constitutional: NAD, confused Eyes: no scleral icterus ENMT: Mucous membranes are moist.  Neck: normal, supple Respiratory: mostly clear, no wheezing, no cracles Cardiovascular: Regular rate and rhythm. No LE edema. Good peripheral pulses Abdomen: non distended, no tenderness. Bowel sounds positive.  Musculoskeletal: no clubbing / cyanosis.  Skin: no rashes Neurologic: non focal Psychiatric: unable to assess   Data Reviewed: I have independently reviewed following labs and imaging studies   CBC: Recent Labs  Lab 01/08/19 0358 01/09/19 0327  WBC 5.7 4.1  NEUTROABS 5.1 3.4  HGB 12.7 11.1*  HCT 39.8 36.4  MCV 93.6 94.3  PLT 112* [id number]*   Basic Metabolic Panel: Recent Labs  Lab 01/08/19 0358 01/09/19 0327  NA 140 141  K 3.7 3.8  CL 109 111  CO2 22 20*  GLUCOSE 136* 116*  BUN 26* 30*  CREATININE 1.05* 0.84  CALCIUM 9.0 8.8*  MG  --  2.0  PHOS  --  3.6   GFR: Estimated Creatinine Clearance: 32.3 mL/min (by C-G formula based on SCr of 0.84 mg/dL). Liver Function Tests: Recent Labs  Lab 01/08/19 0358 01/09/19 0327  AST 28 25  ALT 19 17  ALKPHOS 94 74  BILITOT 0.6 0.8  PROT 7.4 6.3*  ALBUMIN 4.1 3.4*   No results for input(s): LIPASE, AMYLASE in the last 168 hours. No results for input(s): AMMONIA in the last 168 hours. Coagulation Profile: No results for input(s): INR, PROTIME in the last 168 hours. Cardiac Enzymes: No results for input(s): CKTOTAL, CKMB, CKMBINDEX, TROPONINI in the last 168 hours. BNP (last 3 results) No results for input(s):  PROBNP in the last 8760 hours. HbA1C: No results for input(s): HGBA1C in the last 72 hours. CBG: Recent Labs  Lab 01/08/19 1630 01/08/19 1944 01/09/19 0007 01/09/19 0531 01/09/19 0811  GLUCAP 124* 137* 112* 90 123*   Lipid Profile: Recent Labs    01/08/19 0358  TRIG 201*   Thyroid Function Tests: No results for input(s): TSH, T4TOTAL, FREET4, T3FREE, THYROIDAB in the last 72 hours. Anemia Panel: Recent Labs    01/08/19 0358 01/09/19 0327  FERRITIN 237 223   Urine analysis:    Component Value Date/Time   COLORURINE AMBER (A) 01/21/2016 1104   APPEARANCEUR HAZY (A) 01/21/2016 1104   LABSPEC 1.019 01/21/2016 1104   PHURINE 5.0 01/21/2016 1104   GLUCOSEU NEGATIVE 01/21/2016 1104   HGBUR NEGATIVE 01/21/2016 1104   BILIRUBINUR NEGATIVE 01/21/2016 1104   KETONESUR NEGATIVE 01/21/2016 1104   PROTEINUR NEGATIVE 01/21/2016 1104   NITRITE NEGATIVE 01/21/2016 1104   LEUKOCYTESUR TRACE (A) 01/21/2016 1104   Sepsis Labs: Invalid input(s):  PROCALCITONIN, LACTICIDVEN  Recent Results (from the past 240 hour(s))  Blood Culture (routine x 2)     Status: None (Preliminary result)   Collection Time: 01/08/19  3:58 AM   Specimen: BLOOD  Result Value Ref Range Status   Specimen Description   Final    BLOOD BLOOD RIGHT FOREARM Performed at [facility] [address] [place], [place] [address]    Special Requests   Final    BOTTLES DRAWN AEROBIC AND ANAEROBIC Blood Culture adequate volume Performed at [facility] [address], [place], [place] [address]    Culture   Final    NO GROWTH < 24 HOURS Performed at [facility] [address], [place], [place] [address]    Report Status PENDING  Incomplete  Blood Culture (routine x 2)     Status: None (Preliminary result)   Collection Time: 01/08/19  9:01 AM   Specimen: BLOOD LEFT HAND  Result Value Ref Range Status   Specimen Description   Final    BLOOD LEFT HAND Performed at  [facility] [address] [place], [place] [address]    Special Requests   Final    BOTTLES DRAWN AEROBIC AND ANAEROBIC Blood Culture adequate volume Performed at [facility] [address], [place], [place] [address]    Culture   Final    NO GROWTH < 24 HOURS Performed at [facility] [address] [place], [place] [address]    Report Status PENDING  Incomplete      Radiology Studies: DG Chest Port 1 View  Result Date: 01/08/2019 CLINICAL DATA:  Probable COVID.  Fever and hypoxia EXAM: PORTABLE CHEST 1 VIEW COMPARISON:  12/15/2015 FINDINGS: Patchy bilateral pulmonary infiltrate. Normal heart size and stable aortic tortuosity. No edema, effusion, or pneumothorax when accounting for skin folds. Postoperative left breast and axilla. IMPRESSION: Atypical pneumonia pattern. Electronically Signed   By: Monte Fantasia M.D.   On: 01/08/2019 04:20    Marzetta Board, MD, PhD [facility]  Contact via  [email or web address]  [place] P: [phone] F: [phone]

## 2019-01-09 NOTE — Progress Notes (Signed)
PT Cancellation Note  Patient Details Name: Sheri Hensley MRN: [id number] DOB: 31-Dec-1919   Cancelled Treatment:    Reason Eval/Treat Not Completed: Other (comment)  Reviewed notes and spoke with nurse tech who reports pt very confused and possibly sundowning.  Pt unable to follow commands.  Will f/u as able.  [name], PT Acute Rehab Services Pager [phone] [facility] [phone]    [name] 01/09/2019, 4:50 PM

## 2019-01-09 NOTE — Progress Notes (Signed)
Pt became agitated tonight.  Haldol 2 mg was given.  Stayed with patient to reassure her she is safe.  Called her daughter so she could talk to her. NT with patient.

## 2019-01-09 NOTE — Progress Notes (Addendum)
Recommendation for agitation:  Risperdal 0.5 mg at bedtime and one time daily PRN.  Discontinue Ambien.  Delirium:  Virtually any medical condition or physiologic stress can precipitate delirium in a susceptible individual, with risk increasing in those with: advanced age, sensory impairments, organic brain disease (stroke, dementia, Parkinsons), psychiatric illness, major chronic medical issues, prolonged hospitalizations, postoperative status, anemia, insomnia/disturbed sleep, and severe pain. Addressing the underlying medical condition and institution of preventative measures are recommended.  - Continue to monitor and treat underlying medical causes of delirium, including infection, electrolyte disturbances, etc. - Delirium precautions - Minimize/avoid deliriogenic meds including:     anticholinergic, opiates, benzodiazepines           - Maintain hydration, oxygenation, nutrition           - Limit use of restraints and catheters           - Normalize sleep patterns by minimizing              nighttime noise, light and interruptions by              - Ensure sleep apnea treatment is provided              overnight.  Clustering care, opening blinds              during the day           - Reorient the patient frequently, provide easily              visible clock and calendar           - Provide sensory aids like glasses, hearing aids           - Encourage ambulation, regular activities to              maintain cognitive stimulation   Waylan Boga, PMHNP

## 2019-01-10 ENCOUNTER — Inpatient Hospital Stay (HOSPITAL_COMMUNITY): Payer: Medicare HMO

## 2019-01-10 DIAGNOSIS — R0600 Dyspnea, unspecified: Secondary | ICD-10-CM

## 2019-01-10 LAB — BRAIN NATRIURETIC PEPTIDE: B Natriuretic Peptide: 1111.1 pg/mL — ABNORMAL HIGH (ref 0.0–100.0)

## 2019-01-10 LAB — CBC WITH DIFFERENTIAL/PLATELET
Abs Immature Granulocytes: 0.03 10*3/uL (ref 0.00–0.07)
Basophils Absolute: 0 10*3/uL (ref 0.0–0.1)
Basophils Relative: 0 %
Eosinophils Absolute: 0 10*3/uL (ref 0.0–0.5)
Eosinophils Relative: 0 %
HCT: 35.7 % — ABNORMAL LOW (ref 36.0–46.0)
Hemoglobin: 11.2 g/dL — ABNORMAL LOW (ref 12.0–15.0)
Immature Granulocytes: 0 %
Lymphocytes Relative: 5 %
Lymphs Abs: 0.5 10*3/uL — ABNORMAL LOW (ref 0.7–4.0)
MCH: 29.2 pg (ref 26.0–34.0)
MCHC: 31.4 g/dL (ref 30.0–36.0)
MCV: 93.2 fL (ref 80.0–100.0)
Monocytes Absolute: 0.4 10*3/uL (ref 0.1–1.0)
Monocytes Relative: 4 %
Neutro Abs: 9.9 10*3/uL — ABNORMAL HIGH (ref 1.7–7.7)
Neutrophils Relative %: 91 %
Platelets: 142 10*3/uL — ABNORMAL LOW (ref 150–400)
RBC: 3.83 MIL/uL — ABNORMAL LOW (ref 3.87–5.11)
RDW: 15.3 % (ref 11.5–15.5)
WBC: 10.8 10*3/uL — ABNORMAL HIGH (ref 4.0–10.5)
nRBC: 0 % (ref 0.0–0.2)

## 2019-01-10 LAB — COMPREHENSIVE METABOLIC PANEL
ALT: 16 U/L (ref 0–44)
AST: 30 U/L (ref 15–41)
Albumin: 3.3 g/dL — ABNORMAL LOW (ref 3.5–5.0)
Alkaline Phosphatase: 70 U/L (ref 38–126)
Anion gap: 11 (ref 5–15)
BUN: 42 mg/dL — ABNORMAL HIGH (ref 8–23)
CO2: 22 mmol/L (ref 22–32)
Calcium: 9 mg/dL (ref 8.9–10.3)
Chloride: 107 mmol/L (ref 98–111)
Creatinine, Ser: 1.43 mg/dL — ABNORMAL HIGH (ref 0.44–1.00)
GFR calc Af Amer: 35 mL/min — ABNORMAL LOW (ref >60)
GFR calc non Af Amer: 31 mL/min — ABNORMAL LOW (ref >60)
Glucose, Bld: 108 mg/dL — ABNORMAL HIGH (ref 70–99)
Potassium: 4 mmol/L (ref 3.5–5.1)
Sodium: 140 mmol/L (ref 135–145)
Total Bilirubin: 0.6 mg/dL (ref 0.3–1.2)
Total Protein: 6.4 g/dL — ABNORMAL LOW (ref 6.5–8.1)

## 2019-01-10 LAB — SEDIMENTATION RATE: Sed Rate: 84 mm/hr — ABNORMAL HIGH (ref 0–22)

## 2019-01-10 LAB — GLUCOSE, CAPILLARY
Glucose-Capillary: 104 mg/dL — ABNORMAL HIGH (ref 70–99)
Glucose-Capillary: 106 mg/dL — ABNORMAL HIGH (ref 70–99)
Glucose-Capillary: 114 mg/dL — ABNORMAL HIGH (ref 70–99)
Glucose-Capillary: 124 mg/dL — ABNORMAL HIGH (ref 70–99)
Glucose-Capillary: 172 mg/dL — ABNORMAL HIGH (ref 70–99)
Glucose-Capillary: 57 mg/dL — ABNORMAL LOW (ref 70–99)
Glucose-Capillary: 82 mg/dL (ref 70–99)

## 2019-01-10 LAB — D-DIMER, QUANTITATIVE: D-Dimer, Quant: 0.7 ug/mL-FEU — ABNORMAL HIGH (ref 0.00–0.50)

## 2019-01-10 LAB — PHOSPHORUS: Phosphorus: 2.9 mg/dL (ref 2.5–4.6)

## 2019-01-10 LAB — FIBRINOGEN: Fibrinogen: 522 mg/dL — ABNORMAL HIGH (ref 210–475)

## 2019-01-10 LAB — MAGNESIUM: Magnesium: 1.8 mg/dL (ref 1.7–2.4)

## 2019-01-10 LAB — FERRITIN: Ferritin: 385 ng/mL — ABNORMAL HIGH (ref 11–307)

## 2019-01-10 LAB — C-REACTIVE PROTEIN: CRP: 8.1 mg/dL — ABNORMAL HIGH (ref <1.0)

## 2019-01-10 MED ORDER — INFLUENZA VAC A&B SA ADJ QUAD 0.5 ML IM PRSY
0.5000 mL | PREFILLED_SYRINGE | INTRAMUSCULAR | Status: DC
  Filled 2019-01-10: qty 0.5

## 2019-01-10 MED ORDER — FUROSEMIDE 10 MG/ML IJ SOLN
20.0000 mg | Freq: Every day | INTRAMUSCULAR | Status: DC
  Administered 2019-01-10 – 2019-01-11 (×2): 20 mg via INTRAVENOUS
  Filled 2019-01-10 (×2): qty 2

## 2019-01-10 MED ORDER — IPRATROPIUM-ALBUTEROL 20-100 MCG/ACT IN AERS
1.0000 | INHALATION_SPRAY | Freq: Three times a day (TID) | RESPIRATORY_TRACT | Status: DC
  Administered 2019-01-10 – 2019-01-18 (×26): 1 via RESPIRATORY_TRACT

## 2019-01-10 MED ORDER — FUROSEMIDE 10 MG/ML IJ SOLN
20.0000 mg | Freq: Once | INTRAMUSCULAR | Status: AC
  Administered 2019-01-10: 20 mg via INTRAVENOUS
  Filled 2019-01-10: qty 2

## 2019-01-10 MED ORDER — ENOXAPARIN SODIUM 30 MG/0.3ML ~~LOC~~ SOLN: 30.0000 mg | SUBCUTANEOUS | Status: DC

## 2019-01-10 MED ORDER — PNEUMOCOCCAL VAC POLYVALENT 25 MCG/0.5ML IJ INJ
0.5000 mL | INJECTION | INTRAMUSCULAR | Status: DC
  Filled 2019-01-10: qty 0.5

## 2019-01-10 MED ORDER — TOCILIZUMAB 400 MG/20ML IV SOLN
400.0000 mg | Freq: Once | INTRAVENOUS | Status: AC
  Administered 2019-01-10: 400 mg via INTRAVENOUS
  Filled 2019-01-10: qty 20

## 2019-01-10 NOTE — Evaluation (Signed)
Physical Therapy Evaluation Patient Details Name: Sheri Hensley MRN: [id number] DOB: 09-29-1919 Today's Date: 01/10/2019   History of Present Illness  85 yo female admitted with acute respiratory failure 2* COVID. Hx of A fib, breast ca/mastectomy, TIA, CAD, macular degeneration.  Clinical Impression  On eval, pt required Min assist for mobility. She walked ~15 feet x 2 using a RW. RR 35 and SpO2 ~84-86% on 3-4L [place] O2 during session. Pt fatigues fairly easily. Unsure of d/c plan. Pt reported that she lives alone. Recommend SNF unless family can provide 24/7 care. Will follow and progress activity as tolerated.     Follow Up Recommendations SNF(HHPT if family is able to provide 24/7 care)    Equipment Recommendations  None recommended by PT    Recommendations for Other Services       Precautions / Restrictions Precautions Precautions: Fall Precaution Comments: monitor O2, RR Restrictions Weight Bearing Restrictions: No      Mobility  Bed Mobility Overal bed mobility: Needs Assistance Bed Mobility: Supine to Sit;Sit to Supine     Supine to sit: Min assist;HOB elevated Sit to supine: Min assist;HOB elevated   General bed mobility comments: Assist for trunk and LEs. Increased time.  Transfers Overall transfer level: Needs assistance Equipment used: Rolling walker (2 wheeled) Transfers: Sit to/from Stand Sit to Stand: Min assist         General transfer comment: Assist to rise, stabilize, control descent. VCs safety, technique, hand placement. Increased assist to rise from low toilet surface  Ambulation/Gait Ambulation/Gait assistance: Min assist Gait Distance (Feet): 15 Feet(2) Assistive device: Rolling walker (2 wheeled) Gait Pattern/deviations: Step-through pattern;Decreased stride length;Trunk flexed     General Gait Details: Assist to stabilize pt and maneuver RW. Slow gait speed. Placed pt on 4L [place] O2 for ambulation. Pt was fatigued after  walk.  Stairs            Wheelchair Mobility    Modified Rankin (Stroke Patients Only)       Balance Overall balance assessment: Needs assistance         Standing balance support: Bilateral upper extremity supported Standing balance-Leahy Scale: Poor                               Pertinent Vitals/Pain Pain Assessment: No/denies pain    Home Living Family/patient expects to be discharged to:: Unsure Living Arrangements: Other relatives(sitter) Available Help at Discharge: Family;Available PRN/intermittently Type of Home: House         Home Equipment: Walker - 2 wheels;Cane - single point      Prior Function Level of Independence: Independent with assistive device(s)         Comments: uses RW for ambulation     Hand Dominance        Extremity/Trunk Assessment   Upper Extremity Assessment Upper Extremity Assessment: Generalized weakness    Lower Extremity Assessment Lower Extremity Assessment: Generalized weakness    Cervical / Trunk Assessment Cervical / Trunk Assessment: Kyphotic  Communication   Communication: No difficulties  Cognition Arousal/Alertness: Awake/alert Behavior During Therapy: WFL for tasks assessed/performed Overall Cognitive Status: Within Functional Limits for tasks assessed                                        General Comments      Exercises  Assessment/Plan    PT Assessment Patient needs continued PT services  PT Problem List Decreased strength;Decreased mobility;Decreased activity tolerance;Decreased balance;Decreased knowledge of use of DME;Cardiopulmonary status limiting activity       PT Treatment Interventions DME instruction;Gait training;Therapeutic activities;Therapeutic exercise;Patient/family education;Balance training;Functional mobility training    PT Goals (Current goals can be found in the Care Plan section)  Acute Rehab PT Goals Patient Stated Goal: none  stated PT Goal Formulation: With patient Time For Goal Achievement: 01/25/19 Potential to Achieve Goals: Good    Frequency Min 3X/week   Barriers to discharge        Co-evaluation               AM-PAC PT "6 Clicks" Mobility  Outcome Measure Help needed turning from your back to your side while in a flat bed without using bedrails?: A Little Help needed moving from lying on your back to sitting on the side of a flat bed without using bedrails?: A Little Help needed moving to and from a bed to a chair (including a wheelchair)?: A Little Help needed standing up from a chair using your arms (e.g., wheelchair or bedside chair)?: A Little Help needed to walk in hospital room?: A Little Help needed climbing 3-5 steps with a railing? : A Lot 6 Click Score: 17    End of Session Equipment Utilized During Treatment: Oxygen Activity Tolerance: Patient limited by fatigue Patient left: in bed;with call bell/phone within reach;with nursing/sitter in room   PT Visit Diagnosis: Muscle weakness (generalized) (M62.81);Difficulty in walking, not elsewhere classified (R26.2)    Time: [id number] PT Time Calculation (min) (ACUTE ONLY): 26 min   Charges:   PT Evaluation $PT Eval Moderate Complexity: 1 Mod PT Treatments $Gait Training: 8-22 mins         Doreatha Massed, PT Acute Rehabilitation Services

## 2019-01-10 NOTE — Progress Notes (Signed)
Pt resting at the present time.SRP, RN

## 2019-01-10 NOTE — Progress Notes (Signed)
Received orders to treat elevated BNP, will cont to monitor VS on Yellow MEWS protocol. SRP, RN

## 2019-01-10 NOTE — Progress Notes (Signed)
Inpatient Diabetes Program Recommendations  AACE/ADA: New Consensus Statement on Inpatient Glycemic Control (2015)  Target Ranges:  Prepandial:   less than 140 mg/dL      Peak postprandial:   less than 180 mg/dL (1-2 hours)      Critically ill patients:  140 - 180 mg/dL   Lab Results  Component Value Date   GLUCAP 124 (H) 01/10/2019   HGBA1C 5.6 12/23/2015    Review of Glycemic Control  Diabetes history: None Outpatient Diabetes medications: None Current orders for Inpatient glycemic control: Novolog 0-9 units Q4H  On Decadron 6 mg Q24H. Hypoglycemia this am.  Inpatient Diabetes Program Recommendations:     Change Novolog to 0-9 units Q6H.  Will continue to follow.  Thank you. Lorenda Peck, RD, LDN, CDE Inpatient Diabetes Coordinator [phone]

## 2019-01-10 NOTE — Progress Notes (Signed)
[facility]                                                                              Patient Demographics  Sheri Hensley, is a 85 y.o. female, DOB - 1919/10/19, [id number]  Admit date - 01/08/2019   Admitting Physician Kerney Elbe, DO  Outpatient Primary MD for the patient is Josetta Huddle, MD  Outpatient specialists:   LOS - 2  days   Medical records reviewed and are as summarized below:    Chief Complaint  Patient presents with  . Shortness of Breath       Brief summary   85 year old female with history of PAF, breast cancer status postmastectomy on tamoxifen, endometrial cancer, CAD, glaucoma, HTN, HLD, narrow complex tachycardia, prior TIAs, prior C. difficile infection came into the hospital with fevers, cough, nausea and vomiting along with decreased appetite for the past week.  She came in contact with some friends from [place] and potentially was exposed to Covid at that time.  She is also been complaining of a cough for the past 4 days.  Chest x-ray on admission consistent with atypical pneumonia pattern.  She was placed on remdesivir and Decadron and admitted to the hospital COVID-19 positive  Assessment & Plan    Principal problem  Acute Hypoxic Resp. Failure due to Acute Covid 19 Viral Pneumonia during the ongoing 2020 Covid 19 Pandemic - POA - Patient presented with fevers, nausea vomiting, decreased appetite.  Chest x-ray showed pneumonia with atypical pattern - Currently hypoxic, O2 sats in 80s, placed on 4 L via nasal cannula, respiratory rate in 30s - Has known sick contact with friends from [place] and was potentially exposed to Covid at that time - continue Steroids  Decadron 6 mg IV daily, Remdesivir per pharmacy protocol  -Stat BNP 1111.1, ordered stat Lasix 20 mg IV x1 due to soft BP.  Will repeat another dose.  Follow repeat chest x-ray. -  continue Supportive care:    Vitamin  C/zinc, albuterol, benzonatate, Tylenol. - Continue to wean oxygen, ambulatory O2 screening daily as tolerated  - Oxygen - SpO2: 90 % O2 Flow Rate (L/min): 4 L/min  -Patient getting more tachypneic and hypoxic, discussed about Actemra with patient's daughter.  CRP> 7, may potentially benefit in COVID-19 pneumonitis.  Explained about Actemra to the patient's daughter, risks and benefits and she agrees to proceed with it.  Will await chest x-ray.  - Continue to follow labs as below  No results found for: [place]  Lab 01/08/19 0358 01/09/19 0327 01/10/19 0249  DDIMER 0.62* 0.38 0.70*  FERRITIN 237 223 385*  CRP 9.3* 10.9* 8.1*  ALT 19 17 16   PROCALCITON <0.10  --   --     Acute on chronic kidney disease stage III -Baseline creatinine around 1.2, currently at baseline -Hold lisinopril  Paroxysmal A. fib, history of narrow complex tachycardia -Heart rate controlled  -Continue amiodarone, metoprolol.  Will hold p.m. dose of metoprolol due to borderline BP  -Not on anticoagulation outpatient.  Continue Plavix   History of CAD -cont statin, plavix, BB, statin  History of endometrial and breast cancer -Outpatient follow-up  In-hospital delirium - patient with sundown while inpatient  Thrombocytopenia -chronic, improving   Code Status: DNR  DVT Prophylaxis:  heparin  Family Communication: Discussed all imaging results, lab results, explained to the patient's daughter   Disposition Plan:  Pending clinical status, currently inpatient given acute hypoxic respiratory failure, acute COVID-19 illness.  Once ambulatory without overt symptoms or hypoxia would consider discharge home.  Time Spent in minutes 35  Procedures:  None   Consultants:   None   Antimicrobials:   Anti-infectives (From admission, onward)   Start     Dose/Rate Route Frequency Ordered Stop   01/09/19 1000  remdesivir 100 mg in sodium chloride 0.9 % 100 mL IVPB     100 mg 200  mL/hr over 30 Minutes Intravenous Daily 01/08/19 0856 01/13/19 0959   01/08/19 1000  remdesivir 200 mg in sodium chloride 0.9% 250 mL IVPB     200 mg 580 mL/hr over 30 Minutes Intravenous Once 01/08/19 0855 01/08/19 1056          Medications  Scheduled Meds: . amiodarone  100 mg Oral q morning - 10a  . vitamin C  500 mg Oral Daily  . atorvastatin  10 mg Oral QHS  . brimonidine  1 drop Both Eyes Q8H  . clopidogrel  75 mg Oral Daily  . dexamethasone (DECADRON) injection  6 mg Intravenous Q24H  . furosemide  20 mg Intravenous Daily  . furosemide  20 mg Intravenous Once  . heparin injection (subcutaneous)  5,000 Units Subcutaneous Q8H  . [START ON 01/11/2019] influenza vaccine adjuvanted  0.5 mL Intramuscular Tomorrow-1000  . insulin aspart  0-9 Units Subcutaneous Q4H  . Ipratropium-Albuterol  1 puff Inhalation TID  . latanoprost  1 drop Both Eyes QHS  . metoprolol tartrate  12.5 mg Oral BID  . multivitamin  1 tablet Oral Daily  . multivitamin-iron-minerals-folic acid  1 tablet Oral q morning - 10a  . pantoprazole  40 mg Oral Daily  . [START ON 01/11/2019] pneumococcal 23 valent vaccine  0.5 mL Intramuscular Tomorrow-1000  . sertraline  50 mg Oral Daily  . vitamin B-12  500 mcg Oral Daily  . zinc sulfate  220 mg Oral Daily   Continuous Infusions: . remdesivir 100 mg in NS 100 mL 100 mg (01/10/19 0955)   PRN Meds:.acetaminophen, albuterol, chlorpheniramine-HYDROcodone, guaiFENesin-dextromethorphan, haloperidol lactate, loratadine, ondansetron **OR** ondansetron (ZOFRAN) IV, polyethylene glycol, traMADol, zolpidem      Subjective:   Sheri Hensley was seen and examined today. Somewhat short of breath, on 4L O2 now. No chest pain.  Appears to be somewhat confused. No nausea, vomiting or diarrhea.    Objective:   Vitals:   01/10/19 0459 01/10/19 1301 01/10/19 1501 01/10/19 1651  BP: (!) 102/48 (!) 108/53 (!) 125/53 (!) 105/53  Pulse: 70 76 73 76  Resp: (!) 22 (!) 32  (!) 30 (!) 30  Temp: 97.8 F (36.6 C) 98.3 F (36.8 C) 98.1 F (36.7 C) 98.3 F (36.8 C)  TempSrc: Oral Oral Oral Oral  SpO2: 90% 90% 90% 90%  Weight:      Height:        Intake/Output Summary (Last 24 hours) at 01/10/2019 1658 Last data filed at 01/10/2019 1500 Gross per 24 hour  Intake 200 ml  Output --  Net 200 ml     Wt Readings from Last 3 Encounters:  01/08/19 53.4 kg  10/09/18 58.1 kg  02/10/18 59.6 kg  Exam  General: Alert and awake, increased work of breathing  Eyes:   HEENT:  Atraumatic, normocephalic,  Cardiovascular: S1 S2 auscultated, RRR  Respiratory: Coarse bilateral rhonchi  Gastrointestinal: Soft, nontender, nondistended, + bowel sounds  Ext: no pedal edema bilaterally  Neuro: Difficult to assess  Musculoskeletal: No digital cyanosis, clubbing  Skin: No rashes  Psych: Somewhat confused   Data Reviewed:  I have personally reviewed following labs and imaging studies  Micro Results Recent Results (from the past 240 hour(s))  Blood Culture (routine x 2)     Status: None (Preliminary result)   Collection Time: 01/08/19  3:58 AM   Specimen: BLOOD  Result Value Ref Range Status   Specimen Description   Final    BLOOD BLOOD RIGHT FOREARM Performed at [facility] [address], [place], [place] [address]    Special Requests   Final    BOTTLES DRAWN AEROBIC AND ANAEROBIC Blood Culture adequate volume Performed at [facility] [address], [place], [place] [address]    Culture   Final    NO GROWTH 2 DAYS Performed at [facility] [address], [place], [place] [address]    Report Status PENDING  Incomplete  Blood Culture (routine x 2)     Status: None (Preliminary result)   Collection Time: 01/08/19  9:01 AM   Specimen: BLOOD LEFT HAND  Result Value Ref Range Status   Specimen Description   Final    BLOOD LEFT HAND Performed at [facility] [address], [place], [place] [address]    Special Requests   Final    BOTTLES DRAWN AEROBIC AND ANAEROBIC Blood Culture adequate volume Performed at [facility] [address], [place], [place] [address]    Culture   Final    NO GROWTH 2 DAYS Performed at [facility] [address] [place], [place] [address]    Report Status PENDING  Incomplete    Radiology Reports DG CHEST PORT 1 VIEW  Result Date: 01/09/2019 CLINICAL DATA:  Shortness of breath. EXAM: PORTABLE CHEST 1 VIEW COMPARISON:  01/08/2019 FINDINGS: The cardiac silhouette, mediastinal and hilar contours are stable. Stable tortuosity and calcification of the thoracic aorta. Persistent patchy bilateral infiltrates. No effusions or pneumothorax. IMPRESSION: Persistent patchy bilateral infiltrates. Electronically Signed   By: Marijo Sanes M.D.   On: 01/09/2019 06:24   DG Chest Port 1 View  Result Date: 01/08/2019 CLINICAL DATA:  Probable COVID.  Fever and hypoxia EXAM: PORTABLE CHEST 1 VIEW COMPARISON:  12/15/2015 FINDINGS: Patchy bilateral pulmonary infiltrate. Normal heart size and stable aortic tortuosity. No edema, effusion, or pneumothorax when accounting for skin folds. Postoperative left breast and axilla. IMPRESSION: Atypical pneumonia pattern. Electronically Signed   By: Monte Fantasia M.D.   On: 01/08/2019 04:20    Lab Data:  CBC: Recent Labs  Lab 01/08/19 0358 01/09/19 0327 01/10/19 0249  WBC 5.7 4.1 10.8*  NEUTROABS 5.1 3.4 9.9*  HGB 12.7 11.1* 11.2*  HCT 39.8 36.4 35.7*  MCV 93.6 94.3 93.2  PLT 112* 107* [id number]*   Basic Metabolic Panel: Recent Labs  Lab 01/08/19 0358 01/09/19 0327 01/10/19 0249  NA 140 141 140  K 3.7 3.8 4.0  CL 109 111 107  CO2 22 20* 22  GLUCOSE 136* 116* 108*  BUN 26* 30* 42*  CREATININE 1.05* 0.84 1.43*  CALCIUM 9.0 8.8* 9.0  MG  --  2.0 1.8  PHOS  --  3.6 2.9   GFR: Estimated Creatinine Clearance: 19 mL/min (A) (by C-G formula based on SCr of 1.43 mg/dL  (H)). Liver Function Tests: Recent Labs  Lab 01/08/19 0358 01/09/19 0327 01/10/19 0249  AST 28 25 30   ALT 19 17 16   ALKPHOS 94 74 70  BILITOT 0.6 0.8 0.6  PROT 7.4 6.3* 6.4*  ALBUMIN 4.1 3.4* 3.3*   No results for input(s): LIPASE, AMYLASE in the last 168 hours. No results for input(s): AMMONIA in the last 168 hours. Coagulation Profile: No results for input(s): INR, PROTIME in the last 168 hours. Cardiac Enzymes: No results for input(s): CKTOTAL, CKMB, CKMBINDEX, TROPONINI in the last 168 hours. BNP (last 3 results) No results for input(s): PROBNP in the last 8760 hours. HbA1C: No results for input(s): HGBA1C in the last 72 hours. CBG: Recent Labs  Lab 01/10/19 0009 01/10/19 0400 01/10/19 0429 01/10/19 0804 01/10/19 1225  GLUCAP 114* 57* 104* 82 124*   Lipid Profile: Recent Labs    01/08/19 0358  TRIG 201*   Thyroid Function Tests: No results for input(s): TSH, T4TOTAL, FREET4, T3FREE, THYROIDAB in the last 72 hours. Anemia Panel: Recent Labs    01/09/19 0327 01/10/19 0249  FERRITIN 223 385*   Urine analysis:    Component Value Date/Time   COLORURINE AMBER (A) 01/21/2016 1104   APPEARANCEUR HAZY (A) 01/21/2016 1104   LABSPEC 1.019 01/21/2016 1104   PHURINE 5.0 01/21/2016 [place] 01/21/2016 1104   HGBUR NEGATIVE 01/21/2016 1104   BILIRUBINUR NEGATIVE 01/21/2016 [place] 01/21/2016 1104   PROTEINUR NEGATIVE 01/21/2016 1104   NITRITE NEGATIVE 01/21/2016 1104   LEUKOCYTESUR TRACE (A) 01/21/2016 1104     Javarus Dorner M.D. [facility] 01/10/2019, 4:58 PM   Call night coverage person covering after 7pm

## 2019-01-10 NOTE — Progress Notes (Addendum)
Pt restless and agitated from 1645 until 1830. Increased confusion and restlessness, Resp 28-32 O2 started at 3l  85% >>>increased to 5.5 l sats 88-92%, Pt calmer and resting. Pt attempted to climb out of and and continue to remove O2. Md updated throughout the shifts with orders received and completed as ordered. Sitter at bedside, Pt currently in soft wrist restraint, to prevent her from pulling of her O2 and pulling out her IV. Chest xray complete. Laxis given as ordered and pt void via Purewick. Will cont to monitor. SRP, RN

## 2019-01-10 NOTE — Progress Notes (Signed)
BNP 1111.1  MD updated. SRP,RN

## 2019-01-10 NOTE — Progress Notes (Signed)
Hypoglycemic Event  CBG: 57  Treatment: 8 oz juice/soda  Symptoms: None  Follow-up CBG: Time:0429 CBG Result:104  Possible Reasons for Event: Unknown  Comments/MD notified:Yes    Hanna Ra A Traveon Louro

## 2019-01-10 NOTE — Progress Notes (Signed)
Pt MEWS, Yellow, Respiration triggered with rate of 32, O2 sat 90% on 3l, pt has congested cough at times, encourage IS pt at 500 volume.  Md updated and orders received. SRP RN

## 2019-01-11 ENCOUNTER — Encounter (HOSPITAL_COMMUNITY): Payer: Self-pay | Admitting: Internal Medicine

## 2019-01-11 ENCOUNTER — Inpatient Hospital Stay (HOSPITAL_COMMUNITY): Payer: Medicare HMO

## 2019-01-11 DIAGNOSIS — R739 Hyperglycemia, unspecified: Secondary | ICD-10-CM

## 2019-01-11 DIAGNOSIS — U071 COVID-19: Principal | ICD-10-CM

## 2019-01-11 DIAGNOSIS — J9601 Acute respiratory failure with hypoxia: Secondary | ICD-10-CM

## 2019-01-11 DIAGNOSIS — R410 Disorientation, unspecified: Secondary | ICD-10-CM

## 2019-01-11 DIAGNOSIS — J1289 Other viral pneumonia: Secondary | ICD-10-CM

## 2019-01-11 DIAGNOSIS — I482 Chronic atrial fibrillation, unspecified: Secondary | ICD-10-CM

## 2019-01-11 LAB — CBC WITH DIFFERENTIAL/PLATELET
Abs Immature Granulocytes: 0.06 10*3/uL (ref 0.00–0.07)
Basophils Absolute: 0 10*3/uL (ref 0.0–0.1)
Basophils Relative: 0 %
Eosinophils Absolute: 0 10*3/uL (ref 0.0–0.5)
Eosinophils Relative: 0 %
HCT: 36 % (ref 36.0–46.0)
Hemoglobin: 11.6 g/dL — ABNORMAL LOW (ref 12.0–15.0)
Immature Granulocytes: 1 %
Lymphocytes Relative: 6 %
Lymphs Abs: 0.5 10*3/uL — ABNORMAL LOW (ref 0.7–4.0)
MCH: 28.7 pg (ref 26.0–34.0)
MCHC: 32.2 g/dL (ref 30.0–36.0)
MCV: 89.1 fL (ref 80.0–100.0)
Monocytes Absolute: 0.2 10*3/uL (ref 0.1–1.0)
Monocytes Relative: 2 %
Neutro Abs: 7.9 10*3/uL — ABNORMAL HIGH (ref 1.7–7.7)
Neutrophils Relative %: 91 %
Platelets: 141 10*3/uL — ABNORMAL LOW (ref 150–400)
RBC: 4.04 MIL/uL (ref 3.87–5.11)
RDW: 15.3 % (ref 11.5–15.5)
WBC: 8.7 10*3/uL (ref 4.0–10.5)
nRBC: 0 % (ref 0.0–0.2)

## 2019-01-11 LAB — COMPREHENSIVE METABOLIC PANEL
ALT: 20 U/L (ref 0–44)
AST: 37 U/L (ref 15–41)
Albumin: 3 g/dL — ABNORMAL LOW (ref 3.5–5.0)
Alkaline Phosphatase: 70 U/L (ref 38–126)
Anion gap: 12 (ref 5–15)
BUN: 53 mg/dL — ABNORMAL HIGH (ref 8–23)
CO2: 21 mmol/L — ABNORMAL LOW (ref 22–32)
Calcium: 8.6 mg/dL — ABNORMAL LOW (ref 8.9–10.3)
Chloride: 107 mmol/L (ref 98–111)
Creatinine, Ser: 1.39 mg/dL — ABNORMAL HIGH (ref 0.44–1.00)
GFR calc Af Amer: 37 mL/min — ABNORMAL LOW (ref >60)
GFR calc non Af Amer: 32 mL/min — ABNORMAL LOW (ref >60)
Glucose, Bld: 116 mg/dL — ABNORMAL HIGH (ref 70–99)
Potassium: 3.5 mmol/L (ref 3.5–5.1)
Sodium: 140 mmol/L (ref 135–145)
Total Bilirubin: 0.8 mg/dL (ref 0.3–1.2)
Total Protein: 6.2 g/dL — ABNORMAL LOW (ref 6.5–8.1)

## 2019-01-11 LAB — C-REACTIVE PROTEIN: CRP: 23.8 mg/dL — ABNORMAL HIGH (ref <1.0)

## 2019-01-11 LAB — GLUCOSE, CAPILLARY
Glucose-Capillary: 108 mg/dL — ABNORMAL HIGH (ref 70–99)
Glucose-Capillary: 111 mg/dL — ABNORMAL HIGH (ref 70–99)
Glucose-Capillary: 122 mg/dL — ABNORMAL HIGH (ref 70–99)
Glucose-Capillary: 152 mg/dL — ABNORMAL HIGH (ref 70–99)
Glucose-Capillary: 159 mg/dL — ABNORMAL HIGH (ref 70–99)
Glucose-Capillary: 99 mg/dL (ref 70–99)

## 2019-01-11 LAB — FIBRINOGEN: Fibrinogen: 641 mg/dL — ABNORMAL HIGH (ref 210–475)

## 2019-01-11 LAB — D-DIMER, QUANTITATIVE: D-Dimer, Quant: 1.08 ug/mL-FEU — ABNORMAL HIGH (ref 0.00–0.50)

## 2019-01-11 LAB — SEDIMENTATION RATE: Sed Rate: 95 mm/hr — ABNORMAL HIGH (ref 0–22)

## 2019-01-11 LAB — FERRITIN: Ferritin: 446 ng/mL — ABNORMAL HIGH (ref 11–307)

## 2019-01-11 LAB — PHOSPHORUS: Phosphorus: 2.7 mg/dL (ref 2.5–4.6)

## 2019-01-11 LAB — MAGNESIUM: Magnesium: 1.9 mg/dL (ref 1.7–2.4)

## 2019-01-11 MED ORDER — METHYLPREDNISOLONE SODIUM SUCC 40 MG IJ SOLR
40.0000 mg | Freq: Two times a day (BID) | INTRAMUSCULAR | Status: DC
  Administered 2019-01-11 – 2019-01-16 (×10): 40 mg via INTRAVENOUS
  Filled 2019-01-11 (×10): qty 1

## 2019-01-11 MED ORDER — DIGOXIN 0.25 MG/ML IJ SOLN
0.1250 mg | Freq: Once | INTRAMUSCULAR | Status: AC
  Administered 2019-01-11: 0.125 mg via INTRAVENOUS
  Filled 2019-01-11: qty 0.5

## 2019-01-11 MED ORDER — METHYLPREDNISOLONE SODIUM SUCC 125 MG IJ SOLR
60.0000 mg | Freq: Two times a day (BID) | INTRAMUSCULAR | Status: DC
  Filled 2019-01-11: qty 2

## 2019-01-11 MED ORDER — SODIUM CHLORIDE 0.9 % IV BOLUS
500.0000 mL | Freq: Once | INTRAVENOUS | Status: AC
  Administered 2019-01-11: 22:00:00 500 mL via INTRAVENOUS

## 2019-01-11 MED ORDER — IOHEXOL 350 MG/ML SOLN
100.0000 mL | Freq: Once | INTRAVENOUS | Status: AC | PRN
  Administered 2019-01-11: 80 mL via INTRAVENOUS

## 2019-01-11 NOTE — Progress Notes (Signed)
PT Cancellation Note  Patient Details Name: Sheri Hensley MRN: [id number] DOB: [birth year]   Cancelled Treatment:     awaiting CT ANGIO chest r/o PE.  Pt has been evaluated with rec for SNF.   Rica Koyanagi  PTA Acute  Rehabilitation Services Pager      [phone] Office      [phone]

## 2019-01-11 NOTE — Care Management Important Message (Signed)
Important Message  Patient Details IM Letter given to Gabriel Earing RN Case Manager to present to the Patient Name: Sheri Hensley MRN: [id number] Date of Birth: March 10, 1919   Medicare Important Message Given:  Yes     Kerin Salen 01/11/2019, 10:36 AM

## 2019-01-11 NOTE — Progress Notes (Addendum)
[facility]                                                                              Patient Demographics  Sheri Hensley, is a 85 y.o. female, DOB - [birth year], OZ:[id number]  Admit date - 01/08/2019   Admitting Physician Sheri Elbe, DO  Outpatient Primary MD for the patient is Sheri Huddle, MD  Outpatient specialists:   LOS - 3  days   Medical records reviewed and are as summarized below:    Chief Complaint  Patient presents with  . Shortness of Breath       Brief summary   85 year old female with history of PAF, breast cancer status postmastectomy on tamoxifen, endometrial cancer, CAD, glaucoma, HTN, HLD, narrow complex tachycardia, prior TIAs, prior C. difficile infection came into the hospital with fevers, cough, nausea and vomiting along with decreased appetite for the past week.  She came in contact with some friends from [place] and potentially was exposed to Covid at that time.  She is also been complaining of a cough for the past 4 days.  Chest x-ray on admission consistent with atypical pneumonia pattern.  She was placed on remdesivir and Decadron and admitted to the hospital COVID-19 positive  Assessment & Plan    Principal problem  Acute Hypoxic Resp. Failure due to Acute Covid 19 Viral Pneumonia during the ongoing 2020 Covid 19 Pandemic - POA - Patient presented with fevers, nausea vomiting, decreased appetite.  Chest x-ray showed pneumonia with atypical pattern - Has known sick contact with friends from [place] and was potentially exposed to Covid at that time -Currently on IV steroids, remdesivir per pharmacy protocol.  Hypoxic on 5 L O2 on 12/23,  given Actemra x1 on 12/23 after discussing with patient's daughter. CXR  -  continue Supportive care:    Vitamin C/zinc, albuterol, benzonatate, Tylenol. - Continue to wean oxygen, ambulatory O2 screening daily as tolerated  - Oxygen - SpO2: 93  % O2 Flow Rate (L/min): 10 L/min  - will obtain CT angiogram of the chest to rule out PE -Patient getting more tachypneic and hypoxic, despite IV steroids, remdesivir, Actemra x1.  CRP now trended up to 23.8.  Will change to IV Solu-Medrol higher dose at 40 mg every 12 hours. -Transfer to stepdown unit, orders for transfer to [facility] placed when bed available, discussed with PCCM, Dr. Carlis Abbott - Continue to follow labs as below  Recent Labs  Lab 01/08/19 0358 01/09/19 0327 01/10/19 0249 01/11/19 0227  DDIMER 0.62* 0.38 0.70* 1.08*  FERRITIN 237 223 385* 446*  CRP 9.3* 10.9* 8.1* 23.8*  ALT 19 17 16 20   PROCALCITON <0.10  --   --   --     Addendum: CTA chest negative for PE.    Acute on chronic kidney disease stage III -Baseline creatinine around 1.2, currently at baseline -Cr 1.39, better than yesterday, 1.43   Paroxysmal A. fib, history of narrow complex tachycardia -Patient in A. fib RVR this morning with heart rate 100-116, EKG at the bedside showed atrial fibrillation with RVR -Continue amiodarone, on beta-blocker however BP currently soft -Not  on anticoagulation outpatient  History of CAD -Continue Plavix, beta-blocker, statin  History of endometrial and breast cancer -Outpatient follow-up  In-hospital delirium -Patient having sundowning issues, likely worse due to steroids, COVID-19, hospital delirium.  Per patient's daughter and granddaughter, patient is otherwise very functionally active, has no significant dementia.   Thrombocytopenia -chronic, improving   Code Status: DNR  DVT Prophylaxis:  heparin  Family Communication: Discussed all imaging results, lab results, explained to the patient's daughter and granddaughter   Disposition Plan:  Pending clinical status, currently inpatient given acute hypoxic respiratory failure, acute COVID-19 illness.    Transfer to stepdown unit while awaiting bed to [facility] due to worsening acute hypoxia and  respiratory failure.  Time Spent in minutes 45 minutes  Procedures:  None   Consultants:   PCCM, Dr. Carlis Abbott  Antimicrobials:   Anti-infectives (From admission, onward)   Start     Dose/Rate Route Frequency Ordered Stop   01/09/19 1000  remdesivir 100 mg in sodium chloride 0.9 % 100 mL IVPB     100 mg 200 mL/hr over 30 Minutes Intravenous Daily 01/08/19 0856 01/13/19 0959   01/08/19 1000  remdesivir 200 mg in sodium chloride 0.9% 250 mL IVPB     200 mg 580 mL/hr over 30 Minutes Intravenous Once 01/08/19 0855 01/08/19 1056         Medications  Scheduled Meds: . amiodarone  100 mg Oral q morning - 10a  . vitamin C  500 mg Oral Daily  . atorvastatin  10 mg Oral QHS  . brimonidine  1 drop Both Eyes Q8H  . clopidogrel  75 mg Oral Daily  . dexamethasone (DECADRON) injection  6 mg Intravenous Q24H  . furosemide  20 mg Intravenous Daily  . heparin injection (subcutaneous)  5,000 Units Subcutaneous Q8H  . influenza vaccine adjuvanted  0.5 mL Intramuscular Tomorrow-1000  . insulin aspart  0-9 Units Subcutaneous Q4H  . Ipratropium-Albuterol  1 puff Inhalation TID  . latanoprost  1 drop Both Eyes QHS  . metoprolol tartrate  12.5 mg Oral BID  . multivitamin  1 tablet Oral Daily  . multivitamin-iron-minerals-folic acid  1 tablet Oral q morning - 10a  . pantoprazole  40 mg Oral Daily  . pneumococcal 23 valent vaccine  0.5 mL Intramuscular Tomorrow-1000  . sertraline  50 mg Oral Daily  . vitamin B-12  500 mcg Oral Daily  . zinc sulfate  220 mg Oral Daily   Continuous Infusions: . remdesivir 100 mg in NS 100 mL 100 mg (01/11/19 1037)   PRN Meds:.acetaminophen, albuterol, chlorpheniramine-HYDROcodone, guaiFENesin-dextromethorphan, haloperidol lactate, loratadine, ondansetron **OR** ondansetron (ZOFRAN) IV, polyethylene glycol, traMADol, zolpidem      Subjective:   Sheri Hensley was seen and examined today.  Alert and awake, hypoxic, short of breath on 10 L O2.  Denies  any chest pain, no nausea or vomiting or diarrhea.  Still somewhat confused.  Afebrile.  Tachycardiac, heart rate 80-120's  Objective:   Vitals:   01/11/19 0210 01/11/19 0535 01/11/19 1040 01/11/19 1057  BP: 127/82 109/76    Pulse: 65 83    Resp: 18     Temp:  98 F (36.7 C)    TempSrc:  Oral    SpO2: 96% 96% (!) 84% 93%  Weight:      Height:        Intake/Output Summary (Last 24 hours) at 01/11/2019 1125 Last data filed at 01/11/2019 1035 Gross per 24 hour  Intake 339.87 ml  Output  200 ml  Net 139.87 ml     Wt Readings from Last 3 Encounters:  01/08/19 53.4 kg  10/09/18 58.1 kg  02/10/18 59.6 kg   Physical Exam  General: Alert and awake, oriented to self, increased work of breathing  Eyes:   HEENT:  Atraumatic, normocephalic  Cardiovascular: S1 S2 clear, tachycardia, irregularly irregular. no pedal edema b/l  Respiratory: Coarse bilateral rhonchi  Gastrointestinal: Soft, nontender, nondistended, NBS  Ext: no pedal edema bilaterally  Neuro: Moving all 4 extremities  Musculoskeletal: No cyanosis, clubbing  Skin: No rashes  Psych confused   Data Reviewed:  I have personally reviewed following labs and imaging studies  Micro Results Recent Results (from the past 240 hour(s))  Blood Culture (routine x 2)     Status: None (Preliminary result)   Collection Time: 01/08/19  3:58 AM   Specimen: BLOOD  Result Value Ref Range Status   Specimen Description   Final    BLOOD BLOOD RIGHT FOREARM Performed at [facility] [address] [place], [place] [address]    Special Requests   Final    BOTTLES DRAWN AEROBIC AND ANAEROBIC Blood Culture adequate volume Performed at [facility] [address] [place], [place] [address]    Culture   Final    NO GROWTH 3 DAYS Performed at [facility], [place] [address], [place], [place] [address]    Report Status PENDING  Incomplete  Blood Culture (routine x 2)     Status:  None (Preliminary result)   Collection Time: 01/08/19  9:01 AM   Specimen: BLOOD LEFT HAND  Result Value Ref Range Status   Specimen Description   Final    BLOOD LEFT HAND Performed at [facility] [address] [place], [place] [address]    Special Requests   Final    BOTTLES DRAWN AEROBIC AND ANAEROBIC Blood Culture adequate volume Performed at [facility] [address] [place], [place] [address]    Culture   Final    NO GROWTH 3 DAYS Performed at [facility], [place] [address] [place], [place] [address]    Report Status PENDING  Incomplete    Radiology Reports DG CHEST PORT 1 VIEW  Result Date: 01/10/2019 CLINICAL DATA:  COVID-19 infection. Shortness of breath with altered mental status. History of breast and endometrial cancer. EXAM: PORTABLE CHEST 1 VIEW COMPARISON:  Radiographs 02/10/2018, 01/08/2019 and 01/09/2019. CT 05/31/2016. FINDINGS: 1826 hours. The heart size and mediastinal contours are stable with aortic atherosclerosis. There are worsening bilateral ground-glass and airspace opacities bilaterally. No significant pleural effusion or pneumothorax identified. Postsurgical changes are present in the left breast and left axilla. IMPRESSION: Significant worsening of bilateral airspace opacities consistent with progressive viral pneumonia. Electronically Signed   By: Richardean Sale M.D.   On: 01/10/2019 18:56   DG CHEST PORT 1 VIEW  Result Date: 01/09/2019 CLINICAL DATA:  Shortness of breath. EXAM: PORTABLE CHEST 1 VIEW COMPARISON:  01/08/2019 FINDINGS: The cardiac silhouette, mediastinal and hilar contours are stable. Stable tortuosity and calcification of the thoracic aorta. Persistent patchy bilateral infiltrates. No effusions or pneumothorax. IMPRESSION: Persistent patchy bilateral infiltrates. Electronically Signed   By: Marijo Sanes M.D.   On: 01/09/2019 06:24   DG Chest Port 1 View  Result Date: 01/08/2019 CLINICAL  DATA:  Probable COVID.  Fever and hypoxia EXAM: PORTABLE CHEST 1 VIEW COMPARISON:  12/15/2015 FINDINGS: Patchy bilateral pulmonary infiltrate. Normal heart size and  stable aortic tortuosity. No edema, effusion, or pneumothorax when accounting for skin folds. Postoperative left breast and axilla. IMPRESSION: Atypical pneumonia pattern. Electronically Signed   By: Monte Fantasia M.D.   On: 01/08/2019 04:20    Lab Data:  CBC: Recent Labs  Lab 01/08/19 0358 01/09/19 0327 01/10/19 0249 01/11/19 0227  WBC 5.7 4.1 10.8* 8.7  NEUTROABS 5.1 3.4 9.9* 7.9*  HGB 12.7 11.1* 11.2* 11.6*  HCT 39.8 36.4 35.7* 36.0  MCV 93.6 94.3 93.2 89.1  PLT 112* 107* 142* [id number]*   Basic Metabolic Panel: Recent Labs  Lab 01/08/19 0358 01/09/19 0327 01/10/19 0249 01/11/19 0227  NA 140 141 140 140  K 3.7 3.8 4.0 3.5  CL 109 111 107 107  CO2 22 20* 22 21*  GLUCOSE 136* 116* 108* 116*  BUN 26* 30* 42* 53*  CREATININE 1.05* 0.84 1.43* 1.39*  CALCIUM 9.0 8.8* 9.0 8.6*  MG  --  2.0 1.8 1.9  PHOS  --  3.6 2.9 2.7   GFR: Estimated Creatinine Clearance: 19.5 mL/min (A) (by C-G formula based on SCr of 1.39 mg/dL (H)). Liver Function Tests: Recent Labs  Lab 01/08/19 0358 01/09/19 0327 01/10/19 0249 01/11/19 0227  AST 28 25 30  37  ALT 19 17 16 20   ALKPHOS 94 74 70 70  BILITOT 0.6 0.8 0.6 0.8  PROT 7.4 6.3* 6.4* 6.2*  ALBUMIN 4.1 3.4* 3.3* 3.0*   No results for input(s): LIPASE, AMYLASE in the last 168 hours. No results for input(s): AMMONIA in the last 168 hours. Coagulation Profile: No results for input(s): INR, PROTIME in the last 168 hours. Cardiac Enzymes: No results for input(s): CKTOTAL, CKMB, CKMBINDEX, TROPONINI in the last 168 hours. BNP (last 3 results) No results for input(s): PROBNP in the last 8760 hours. HbA1C: No results for input(s): HGBA1C in the last 72 hours. CBG: Recent Labs  Lab 01/10/19 1724 01/10/19 2123 01/11/19 0002 01/11/19 0528 01/11/19 0754  GLUCAP 106* 172* 108*  111* 99   Lipid Profile: No results for input(s): CHOL, HDL, LDLCALC, TRIG, CHOLHDL, LDLDIRECT in the last 72 hours. Thyroid Function Tests: No results for input(s): TSH, T4TOTAL, FREET4, T3FREE, THYROIDAB in the last 72 hours. Anemia Panel: Recent Labs    01/10/19 0249 01/11/19 0227  FERRITIN 385* 446*   Urine analysis:    Component Value Date/Time   COLORURINE AMBER (A) 01/21/2016 1104   APPEARANCEUR HAZY (A) 01/21/2016 1104   LABSPEC 1.019 01/21/2016 1104   PHURINE 5.0 01/21/2016 1104   GLUCOSEU NEGATIVE 01/21/2016 1104   HGBUR NEGATIVE 01/21/2016 1104   BILIRUBINUR NEGATIVE 01/21/2016 [place] 01/21/2016 1104   PROTEINUR NEGATIVE 01/21/2016 1104   NITRITE NEGATIVE 01/21/2016 1104   LEUKOCYTESUR TRACE (A) 01/21/2016 1104     Rosabel Sermeno M.D. [facility] 01/11/2019, 11:25 AM   Call night coverage person covering after 7pm

## 2019-01-11 NOTE — Progress Notes (Signed)
Reported by telemetry at [phone] that patient was in [place] at [place] and 0530. Vs at this time was stable. Patient asymptomatic. HR 112 at this time. Dr Tana [place] paged. Uncoming nurse made aware.

## 2019-01-11 NOTE — Progress Notes (Signed)
Reported by telemetry at [place] that patient converted to [facility] at [place], patient asymptomatic upon assessment. Vs stable. HR 65 but low 100's in monitor. NP made aware.

## 2019-01-11 NOTE — Progress Notes (Signed)
RED MEWS upon shift change. Charge and MD informed. VS q15 for now   Vital Signs MEWS/VS Documentation      01/11/2019 1756 01/11/2019 1800 01/11/2019 1900 01/11/2019 1915   MEWS Score:  4  6  4  5    MEWS Score Color:  Red  Red  Red  Red   Resp:  --  (!) 33  (!) 24  (!) 24   Pulse:  --  (!) 105  (!) 131  (!) 154   BP:  --  96/79  94/67  94/67   Temp:  --  97.8 F (36.6 C)  98 F (36.7 C)  98 F (36.7 C)   O2 Device:  --  Nasal Cannula  Nasal Cannula  Nasal Cannula   O2 Flow Rate (L/min):  --  6 L/min  5 L/min  5 L/min   Level of Consciousness:  --  Alert  Alert  Alert           Melvyn Neth 01/11/2019,7:26 PM

## 2019-01-11 NOTE — Progress Notes (Signed)
Notified MD pt tach pulse sustained >120's-140's., BP soft, currently calm. Awaiting orders.

## 2019-01-11 NOTE — Consult Note (Signed)
NAME:  Sheri Hensley, MRN:  [id number], DOB:  20-May-1919, LOS: 3 ADMISSION DATE:  01/08/2019, CONSULTATION DATE:  12/24 REFERRING MD:  Sheri Hensley, CHIEF COMPLAINT:  hypoxia   Brief History   COVID pneumonia, rapid increase in oxygen requirements  History of present illness   Sheri Hensley is a 85 year old woman who was admitted on 12/21 with acute hypoxic respiratory failure due to COVID-19 viral pneumonia.  She lives independently in the community with family nearby, but had recently had out-of-town guests come to visit from [place] who were unaware that they had been exposed to COVID-19.  She and several family members were sick prior to admission.  She had a cough, fevers, nausea, vomiting, diarrhea, but developed sputum production and dyspnea on exertion prompting her to present to the ED.  Since admission she has been treated with remdesivir and Decadron.  On 12/23 she received a dose of Tocilizumab.  Critical care medicine has been consulted due to rapidly increasing oxygen requirements.  Yesterday she was requiring 4 L, and today she is requiring 10 L.  Overnight she had delirium, and currently has a Engineer, materials.  During the day her delirium has resolved and she has been appropriate per her bedside sitter.  She denies significant dyspnea, but has occasional coughing.  No previous history of lung disease.  Past Medical History  Chronic atrial fibrillation on amiodarone, metoprolol, and Plavix Hypertension HLD  GERD CAD on Plavix Breast cancer-in remission, status post tamoxifen C. difficile  Significant Hospital Events     Consults:  PCCM  Procedures:    Significant Diagnostic Tests: Personally reviewed  CXR 12/23-bilateral patchy infiltrates, progressed since admission  Micro Data:  SARS-CoV-2 positive 12/21 blood cultures>>  Antimicrobials:  Remdesivir 12/21>>   Interim history/subjective:    Objective   Blood pressure (!) 89/68, pulse (!) 57,  temperature 97.9 F (36.6 C), temperature source Oral, resp. rate 12, height '5\' 5"'  (1.651 m), weight 53.4 kg, SpO2 (!) 87 %.        Intake/Output Summary (Last 24 hours) at 01/11/2019 1337 Last data filed at 01/11/2019 1035 Gross per 24 hour  Intake 279.87 ml  Output 200 ml  Net 79.87 ml   Filed Weights   01/08/19 0248 01/08/19 0845  Weight: 56.7 kg 53.4 kg    Examination: General: Frail appearing elderly woman laying in bed in no acute distress, watching TV and looking around the room HENT: [place]/AT, eyes anicteric Lungs: Rales bilaterally, tachypneic, but no accessory muscle use.  Normal speech.  90% on 6 L nasal cannula (previously 97% on NRB at 10 L) Cardiovascular: Mild tachycardia, irregular rhythm Abdomen: Soft, nontender, nondistended Extremities: No peripheral edema, no clubbing or cyanosis Neuro: Awake and alert, oriented to person, place, situation.  Moving all extremities spontaneously.  Able to sit forward in bed with minimal assistance.  Comprehension appears intact.  Derm: Multiple moles versus neurofibromas on neck and upper chest  Resolved Hospital Problem list     Assessment & Plan:  Acute hypoxic respiratory failure due to COVID-19 viral pneumonia.  Possibly acute pulmonary edema given elevated BNP level. -Nasal cannula as required to maintain SPO2 88 to 92%.  Periodic desaturations into the 80s are fine as long as her work of breathing remains appropriate and her mental status remains unchanged.  I discussed with her nurse that if her oxygen requirements increase, she should switch from a clear to a green high flow nasal cannula.  NRBs should only be used at 15  L, but the Ventimask would be fine at lower flow rates. -Continue remdesivir per hospital protocol.  Continue LFT monitoring. -Continue steroids.  Given her delirium, Dr. Tana Hensley and I discussed decreasing to 40 mg twice daily -Recommend awake prone positioning -Given her delirium and appropriate work of  breathing with only modest increase in oxygen requirements, I recommend not transferring to stepdown to [facility].  I agree with the plan to transfer to [facility] if required. -Continue following D-dimer, ferritin, LDH, CRP -DVT prophylaxis per D-dimer Covid protocol -Sputum culture if she is able to expectorate sputum, although my suspicion for bacterial pneumonia at this point is low based on CXR. -Continue gentle diuresis  Chronic atrial fibrillation, CAD, HFpEF -Continue PTA amiodarone and metoprolol -Continue PTA Plavix Continue PTA atorvastatin  Delirium-likely not helped by limited family visitation, steroids. -Consider holding Ambien.  Could try ramelteon to help with delirium. -Agree with bedside sitter for frequent reorientation and assistance with behavior control -Delirium precautions  Mild AKI, baseline creatinine likely around 1 -Continue to monitor -Avoid nephrotoxic meds -Renally dose medications  Mild acute normocytic anemia-likely due to frequent blood draws and acute illness -Continue to monitor -Transfuse for hemoglobin less than 7  Hyperglycemia due to steroids -Continue Accu-Cheks and sliding scale insulin as needed  Best practice:  Diet: Cardiac Pain/Anxiety/Delirium protocol (if indicated): Per primary VAP protocol (if indicated): n/a DVT prophylaxis: Heparin [place] GI prophylaxis: Pantoprazole Glucose control: SSI Mobility: Progressive Code Status: DNR Family Communication: Per primary Disposition: transfer to [facility] when a bed is available  Labs   CBC: Recent Labs  Lab 01/08/19 0358 01/09/19 0327 01/10/19 0249 01/11/19 0227  WBC 5.7 4.1 10.8* 8.7  NEUTROABS 5.1 3.4 9.9* 7.9*  HGB 12.7 11.1* 11.2* 11.6*  HCT 39.8 36.4 35.7* 36.0  MCV 93.6 94.3 93.2 89.1  PLT 112* 107* 142* 141*    Basic Metabolic Panel: Recent Labs  Lab 01/08/19 0358 01/09/19 0327 01/10/19 0249 01/11/19 0227  NA 140 141 140 140  K 3.7 3.8 4.0 3.5  CL 109  111 107 107  CO2 22 20* 22 21*  GLUCOSE 136* 116* 108* 116*  BUN 26* 30* 42* 53*  CREATININE 1.05* 0.84 1.43* 1.39*  CALCIUM 9.0 8.8* 9.0 8.6*  MG  --  2.0 1.8 1.9  PHOS  --  3.6 2.9 2.7   GFR: Estimated Creatinine Clearance: 19.5 mL/min (A) (by C-G formula based on SCr of 1.39 mg/dL (H)). Recent Labs  Lab 01/08/19 0357 01/08/19 0358 01/09/19 0327 01/10/19 0249 01/11/19 0227  PROCALCITON  --  <0.10  --   --   --   WBC  --  5.7 4.1 10.8* 8.7  LATICACIDVEN 1.1  --   --   --   --     Liver Function Tests: Recent Labs  Lab 01/08/19 0358 01/09/19 0327 01/10/19 0249 01/11/19 0227  AST '28 25 30 ' 37  ALT '19 17 16 20  ' ALKPHOS 94 74 70 70  BILITOT 0.6 0.8 0.6 0.8  PROT 7.4 6.3* 6.4* 6.2*  ALBUMIN 4.1 3.4* 3.3* 3.0*   No results for input(s): LIPASE, AMYLASE in the last 168 hours. No results for input(s): AMMONIA in the last 168 hours.  ABG    Component Value Date/Time   TCO2 24 12/22/2015 2230     Coagulation Profile: No results for input(s): INR, PROTIME in the last 168 hours.  Cardiac Enzymes: No results for input(s): CKTOTAL, CKMB, CKMBINDEX, TROPONINI in the last 168 hours.  HbA1C:  Hgb A1c MFr Bld  Date/Time Value Ref Range Status  12/23/2015 04:25 AM 5.6 4.8 - 5.6 % Final    Comment:    (NOTE)         Pre-diabetes: 5.7 - 6.4         Diabetes: >6.4         Glycemic control for adults with diabetes: <7.0     CBG: Recent Labs  Lab 01/10/19 2123 01/11/19 0002 01/11/19 0528 01/11/19 0754 01/11/19 1148  GLUCAP 172* 108* 111* 99 122*    D-dimer 1.08 Ferritin 446 CRP 23.8 ESR 95 LDH 198 12/17 echocardiogram-LVEF 60 to 03%, grade 1 diastolic dysfunction.  Moderately thickened aortic valve with trivial regurgitation.  Mildly dilated left atrium.   Review of Systems:   Positive for cough, mild dyspnea, delirium, otherwise review of systems negative  Past Medical History  She,  has a past medical history of Atrial fibrillation ([place]), Bruises  easily, Cancer ([place]) (1998), Coronary artery disease, Endometrial cancer ([place]), Endometrial cancer ([place]), Fall (06/2017), GERD (gastroesophageal reflux disease), Glaucoma, both eyes, History of breast cancer, History of Clostridium difficile infection (5-6 yrs ago), History of TIAs (12/22/2015), Hyperlipidemia, Hypertension, Macular degeneration, Narrow complex tachycardia (HCC), OA (osteoarthritis), PMB (postmenopausal bleeding), PONV (postoperative nausea and vomiting), Thickened endometrium, and Wears glasses.   Surgical History    Past Surgical History:  Procedure Laterality Date  . APPENDECTOMY  1954  . CATARACT EXTRACTION W/ INTRAOCULAR LENS  IMPLANT, BILATERAL  1994  . colonscopy    . DILATION AND CURETTAGE OF UTERUS N/A 11/20/2015   Procedure: DILATATION AND CURETTAGE;  Surgeon: Everitt Amber, MD;  Location: [facility];  Service: Gynecology;  Laterality: N/A;  . DILATION AND CURETTAGE, DIAGNOSTIC / THERAPEUTIC N/A novmber 2017   per daughter with biopsy as stated  . KNEE ARTHROSCOPY Right 2002  . MASTECTOMY Left 1998   w/ lympn node dissection's  . ROBOTIC ASSISTED TOTAL HYSTERECTOMY WITH BILATERAL SALPINGO OOPHERECTOMY Bilateral 01/27/2016   Procedure: XI ROBOTIC ASSISTED TOTAL HYSTERECTOMY WITH BILATERAL SALPINGO OOPHORECTOMY;  Surgeon: Everitt Amber, MD;  Location: [facility];  Service: Gynecology;  Laterality: Bilateral;     Social History   reports that she has never smoked. She has never used smokeless tobacco. She reports that she does not drink alcohol or use drugs.   Family History   Her family history includes Colon cancer in her brother; Hypertension in her father and mother; Liver cancer in her brother.   Allergies Allergies  Allergen Reactions  . Ativan [Lorazepam]     "HALLUCINATIONS"  . Flagyl [Metronidazole] Rash     Home Medications  Prior to Admission medications   Medication Sig Start Date End Date Taking? Authorizing Provider  acetaminophen  (TYLENOL) 500 MG tablet Take 1,000 mg by mouth every 6 (six) hours as needed for moderate pain or headache.    Yes [provider]  amiodarone (PACERONE) 200 MG tablet Take 0.5 tablets (100 mg total) by mouth every morning. 04/10/18  Yes Revankar, Reita Cliche, MD  atorvastatin (LIPITOR) 10 MG tablet Take 1 tablet (10 mg total) by mouth at bedtime. 03/29/17  Yes Revankar, Reita Cliche, MD  bimatoprost (LUMIGAN) 0.01 % SOLN Place 1 drop into both eyes at bedtime.   Yes [provider]  brimonidine (ALPHAGAN P) 0.1 % SOLN Place 1 drop into both eyes every 8 (eight) hours.  03/12/13  Yes [provider]  clopidogrel (PLAVIX) 75 MG tablet Take 1 tablet (75 mg total) by mouth  daily. 12/24/15  Yes Ghimire, Henreitta Leber, MD  cyanocobalamin (V-R VITAMIN B-12) 500 MCG tablet Take 500 mcg by mouth daily.    Yes [provider]  ibuprofen (ADVIL,MOTRIN) 600 MG tablet Take 1 tablet (600 mg total) by mouth every 6 (six) hours as needed (mild pain). 01/28/16  Yes Everitt Amber, MD  lisinopril (PRINIVIL,ZESTRIL) 10 MG tablet Take 10 mg by mouth every morning.    Yes [provider]  loratadine (CLARITIN) 10 MG tablet Take 10 mg by mouth at bedtime.    Yes [provider]  Melatonin 10 MG CAPS Take 10 mg by mouth at bedtime.    Yes [provider]  metoprolol tartrate (LOPRESSOR) 25 MG tablet Take 12.5 mg by mouth 2 (two) times daily.    Yes [provider]  Multiple Vitamins-Minerals (CENTRUM VITAMINTS PO) Take 1 tablet by mouth every morning.   Yes [provider]  Multiple Vitamins-Minerals (ICAPS AREDS 2) CAPS Take 1 tablet by mouth 2 (two) times daily.    Yes [provider]  sertraline (ZOLOFT) 50 MG tablet Take 50 mg by mouth daily.   Yes [provider]     Julian Hy, DO 01/11/19 1:37 PM [facility]

## 2019-01-12 LAB — GLUCOSE, CAPILLARY
Glucose-Capillary: 123 mg/dL — ABNORMAL HIGH (ref 70–99)
Glucose-Capillary: 138 mg/dL — ABNORMAL HIGH (ref 70–99)
Glucose-Capillary: 150 mg/dL — ABNORMAL HIGH (ref 70–99)
Glucose-Capillary: 156 mg/dL — ABNORMAL HIGH (ref 70–99)
Glucose-Capillary: 165 mg/dL — ABNORMAL HIGH (ref 70–99)
Glucose-Capillary: 178 mg/dL — ABNORMAL HIGH (ref 70–99)

## 2019-01-12 MED ORDER — ADULT MULTIVITAMIN W/MINERALS CH
1.0000 | ORAL_TABLET | Freq: Every day | ORAL | Status: DC
  Administered 2019-01-13 – 2019-01-18 (×6): 1 via ORAL
  Filled 2019-01-12 (×6): qty 1

## 2019-01-12 MED ORDER — METOPROLOL TARTRATE 25 MG PO TABS
12.5000 mg | ORAL_TABLET | Freq: Two times a day (BID) | ORAL | Status: DC
  Administered 2019-01-12 – 2019-01-15 (×8): 12.5 mg via ORAL
  Filled 2019-01-12 (×8): qty 1

## 2019-01-12 NOTE — Progress Notes (Signed)
Update daughter Olegario Shearer on care. Answered questions and concerns.

## 2019-01-12 NOTE — Plan of Care (Signed)
Pt slept well during the night. No complaints of pain verbalized. Alert and oriented x4. O2 weaned down to 3L [place]. Minimal dyspnea on exertion noted, tachypneic but improved from last night. Continues to be in afib but rate more controlled compared to last night after IV bolus and 1 dose of IV Digoxin given. HR now between 95-120s, BP stable - spoke to Dr. Vanita Ingles, no further interventions for now. Minimal assist with ADLs. Purewick in place. Assisted regular repositioning for pressure relief. No other issues, will monitor.   Problem: Activity: Goal: Risk for activity intolerance will decrease Outcome: Progressing   Problem: Nutrition: Goal: Adequate nutrition will be maintained Outcome: Progressing   Problem: Respiratory: Goal: Will maintain a patent airway Outcome: Progressing Goal: Complications related to the disease process, condition or treatment will be avoided or minimized Outcome: Progressing   Problem: Cardiac: Goal: Ability to achieve and maintain adequate cardiopulmonary perfusion will improve Outcome: Progressing

## 2019-01-12 NOTE — Progress Notes (Signed)
PROGRESS NOTE    JOLEAN PROSEN  [id number] DOB: 09-24-1919 DOA: 01/08/2019 PCP: Josetta Huddle, MD    Brief Narrative:  85 year old female with history of PAF, breast cancer status postmastectomy on tamoxifen, endometrial cancer, CAD, glaucoma, HTN, HLD, narrow complex tachycardia, prior TIAs, prior C. difficile infection came into the hospital with fevers, cough, nausea and vomiting along with decreased appetite for the past week.  COVID-19 positive.  Chest x-ray on admission consistent with atypical pneumonia pattern. She was placed on remdesivir and Decadron and admitted to the hospital.  Patient living independent in an senior apartment with her daughter nearby. 01/11/2019: Delirium, increasing oxygen requirement.  Transfer to [facility].   Assessment & Plan:   Active Problems:   Endometrial cancer ([place])   TIA (transient ischemic attack)   Essential hypertension   Paroxysmal atrial fibrillation (HCC)   Acute respiratory failure with hypoxia (HCC)   COVID-19 virus infection   Pneumonia due to COVID-19 virus  Pneumonia due to COVID-19 virus, acute hypoxemic respiratory failure: Continue to monitor due to significant symptoms, still on significant oxygen. chest physiotherapy, incentive spirometry, deep breathing exercises, sputum induction, mucolytic's and bronchodilators. Supplemental oxygen to keep saturations more than 90%. Covid directed therapy with , steroids, on Solu-Medrol remdesivir, day 5/5.  Finished therapy. actemra, 1 dose given on 01/11/2019. antibiotics, not indicated. Due to severity of symptoms, patient will need daily inflammatory markers, chest x-rays, liver function test to monitor and direct COVID-19 therapies. Mobilize.  Start working with therapies today. CTA negative for pulmonary embolism.  Paroxysmal A. fib, now with RVR: Patient on amiodarone and metoprolol at home.  Developed rapid A. fib.  1 dose of digoxin at night.  Apparently,  was not getting metoprolol in the hospital. Continue amiodarone, resume metoprolol today, as needed metoprolol. Not on anticoagulation.  Coronary artery disease: Is stable now.  On Plavix, beta-blockers and statin.  Delirium: Patient developed delirium due to acute illnesses, worsened by medications.  Symptomatically improving.  Frequent orientation, regulate day and night cycles.  DVT prophylaxis: Heparin subcu Code Status: DNR Family Communication: Patient's daughter on the phone Disposition Plan: Pending clinical improvement.  Mobilize with therapies today.   Consultants:   PCCM, signed off  Procedures:   None  Antimicrobials:  Anti-infectives (From admission, onward)   Start     Dose/Rate Route Frequency Ordered Stop   01/09/19 1000  remdesivir 100 mg in sodium chloride 0.9 % 100 mL IVPB     100 mg 200 mL/hr over 30 Minutes Intravenous Daily 01/08/19 0856 01/12/19 1032   01/08/19 1000  remdesivir 200 mg in sodium chloride 0.9% 250 mL IVPB     200 mg 580 mL/hr over 30 Minutes Intravenous Once 01/08/19 0855 01/08/19 1056         Subjective: Patient seen and examined.  Reviewed hospital transfer.  Overnight had episodes of RVR with heart rate as high as 140. Patient herself is more quiet and composed.  No more confusion.  Patient also received 500 mL fluid bolus last night. She was able to talk to her daughter when I dialed her from the room phone.  Has some cough.  Denies any shortness of breath.  Objective: Vitals:   01/12/19 0400 01/12/19 0700 01/12/19 0800 01/12/19 0954  BP: 113/81 104/77 103/88   Pulse: 99 (!) 53 (!) 106 (!) 106  Resp: (!) 21 20 (!) 24   Temp: 99 F (37.2 C) 98 F (36.7 C) 98 F (36.7 C)   TempSrc:  Oral Oral Oral   SpO2: 98% 96% 92%   Weight:      Height:        Intake/Output Summary (Last 24 hours) at 01/12/2019 1147 Last data filed at 01/12/2019 1049 Gross per 24 hour  Intake 1317.73 ml  Output 775 ml  Net 542.73 ml   Filed  Weights   01/08/19 0248 01/08/19 0845  Weight: 56.7 kg 53.4 kg    Examination:  General exam: Appears calm and comfortable, appropriate looking as 85 year old. Respiratory system: Clear to auscultation. Respiratory effort normal.  Mostly conducted upper airway sounds. Cardiovascular system: S1 & S2 heard, irregularly irregular.  No JVD, murmurs, rubs, gallops or clicks. No pedal edema. Gastrointestinal system: Abdomen is nondistended, soft and nontender. No organomegaly or masses felt. Normal bowel sounds heard. Central nervous system: Alert and oriented. No focal neurological deficits. Extremities: Symmetric 5 x 5 power. Skin: No rashes, lesions or ulcers Psychiatry: Judgement and insight appear normal. Mood & affect flat.    Data Reviewed: I have personally reviewed following labs and imaging studies  CBC: Recent Labs  Lab 01/08/19 0358 01/09/19 0327 01/10/19 0249 01/11/19 0227  WBC 5.7 4.1 10.8* 8.7  NEUTROABS 5.1 3.4 9.9* 7.9*  HGB 12.7 11.1* 11.2* 11.6*  HCT 39.8 36.4 35.7* 36.0  MCV 93.6 94.3 93.2 89.1  PLT 112* 107* 142* [id number]*   Basic Metabolic Panel: Recent Labs  Lab 01/08/19 0358 01/09/19 0327 01/10/19 0249 01/11/19 0227  NA 140 141 140 140  K 3.7 3.8 4.0 3.5  CL 109 111 107 107  CO2 22 20* 22 21*  GLUCOSE 136* 116* 108* 116*  BUN 26* 30* 42* 53*  CREATININE 1.05* 0.84 1.43* 1.39*  CALCIUM 9.0 8.8* 9.0 8.6*  MG  --  2.0 1.8 1.9  PHOS  --  3.6 2.9 2.7   GFR: Estimated Creatinine Clearance: 19.5 mL/min (A) (by C-G formula based on SCr of 1.39 mg/dL (H)). Liver Function Tests: Recent Labs  Lab 01/08/19 0358 01/09/19 0327 01/10/19 0249 01/11/19 0227  AST 28 25 30  37  ALT 19 17 16 20   ALKPHOS 94 74 70 70  BILITOT 0.6 0.8 0.6 0.8  PROT 7.4 6.3* 6.4* 6.2*  ALBUMIN 4.1 3.4* 3.3* 3.0*   No results for input(s): LIPASE, AMYLASE in the last 168 hours. No results for input(s): AMMONIA in the last 168 hours. Coagulation Profile: No results for  input(s): INR, PROTIME in the last 168 hours. Cardiac Enzymes: No results for input(s): CKTOTAL, CKMB, CKMBINDEX, TROPONINI in the last 168 hours. BNP (last 3 results) No results for input(s): PROBNP in the last 8760 hours. HbA1C: No results for input(s): HGBA1C in the last 72 hours. CBG: Recent Labs  Lab 01/11/19 1607 01/11/19 2002 01/12/19 0022 01/12/19 0456 01/12/19 0757  GLUCAP 159* 152* 150* 156* 138*   Lipid Profile: No results for input(s): CHOL, HDL, LDLCALC, TRIG, CHOLHDL, LDLDIRECT in the last 72 hours. Thyroid Function Tests: No results for input(s): TSH, T4TOTAL, FREET4, T3FREE, THYROIDAB in the last 72 hours. Anemia Panel: Recent Labs    01/10/19 0249 01/11/19 0227  FERRITIN 385* 446*   Sepsis Labs: Recent Labs  Lab 01/08/19 0357 01/08/19 0358  PROCALCITON  --  <0.10  LATICACIDVEN 1.1  --     Recent Results (from the past 240 hour(s))  Blood Culture (routine x 2)     Status: None (Preliminary result)   Collection Time: 01/08/19  3:58 AM   Specimen: BLOOD  Result Value Ref Range Status  Specimen Description   Final    BLOOD BLOOD RIGHT FOREARM Performed at [facility] [address] [place], [place] [address]    Special Requests   Final    BOTTLES DRAWN AEROBIC AND ANAEROBIC Blood Culture adequate volume Performed at [facility] [address], [place], [place] [address]    Culture   Final    NO GROWTH 4 DAYS Performed at [facility], [place] [address] [place], [place] [address]    Report Status PENDING  Incomplete  Blood Culture (routine x 2)     Status: None (Preliminary result)   Collection Time: 01/08/19  9:01 AM   Specimen: BLOOD LEFT HAND  Result Value Ref Range Status   Specimen Description   Final    BLOOD LEFT HAND Performed at [facility] [address] [place], [place] [address]    Special Requests   Final    BOTTLES DRAWN AEROBIC AND ANAEROBIC Blood Culture  adequate volume Performed at [facility] [address], [place], [place] [address]    Culture   Final    NO GROWTH 4 DAYS Performed at [facility], [place] [address] [place], [place] [address]    Report Status PENDING  Incomplete         Radiology Studies: CT ANGIO CHEST PE W OR WO CONTRAST  Result Date: 01/11/2019 CLINICAL DATA:  COVID pneumonia. Shortness of breath. EXAM: CT ANGIOGRAPHY CHEST WITH CONTRAST TECHNIQUE: Multidetector CT imaging of the chest was performed using the standard protocol during bolus administration of intravenous contrast. Multiplanar CT image reconstructions and MIPs were obtained to evaluate the vascular anatomy. CONTRAST:  41mL OMNIPAQUE IOHEXOL 350 MG/ML SOLN COMPARISON:  CT chest 05/31/2016 FINDINGS: Cardiovascular: Heart size mildly enlarged. No substantial pericardial effusion. Coronary artery calcification is evident. Atherosclerotic calcification is noted in the wall of the thoracic aorta. No filling defect in the opacified pulmonary arteries to suggest the presence of an acute pulmonary embolus. Mediastinum/Nodes: No mediastinal lymphadenopathy. 1.4 cm right thyroid nodule similar to prior and likely not clinically relevant in this individual. There is no hilar lymphadenopathy. The esophagus has normal imaging features. There is no axillary lymphadenopathy. Lungs/Pleura: Patchy ground-glass airspace opacity noted in the lungs bilaterally. No associated pleural effusion. Upper Abdomen: Unremarkable. Musculoskeletal: No worrisome lytic or sclerotic osseous abnormality.Superior endplate compression deformity at T2, T4, T5, and T6 is new since prior study. Review of the MIP images confirms the above findings. IMPRESSION: 1. No CT evidence for acute pulmonary embolus. 2. Patchy ground-glass airspace opacity in the lungs bilaterally. Imaging features compatible with reported clinical history of COVID-19 pneumonia. 3. Aortic Atherosclerosis  (ICD10-I70.0). Electronically Signed   By: Misty Stanley M.D.   On: 01/11/2019 16:09   DG CHEST PORT 1 VIEW  Result Date: 01/10/2019 CLINICAL DATA:  COVID-19 infection. Shortness of breath with altered mental status. History of breast and endometrial cancer. EXAM: PORTABLE CHEST 1 VIEW COMPARISON:  Radiographs 02/10/2018, 01/08/2019 and 01/09/2019. CT 05/31/2016. FINDINGS: 1826 hours. The heart size and mediastinal contours are stable with aortic atherosclerosis. There are worsening bilateral ground-glass and airspace opacities bilaterally. No significant pleural effusion or pneumothorax identified. Postsurgical changes are present in the left breast and left axilla. IMPRESSION: Significant worsening of bilateral airspace opacities consistent with progressive viral pneumonia. Electronically Signed   By: Richardean Sale M.D.   On: 01/10/2019 18:56        Scheduled Meds: . amiodarone  100  mg Oral q morning - 10a  . vitamin C  500 mg Oral Daily  . atorvastatin  10 mg Oral QHS  . brimonidine  1 drop Both Eyes Q8H  . clopidogrel  75 mg Oral Daily  . heparin injection (subcutaneous)  5,000 Units Subcutaneous Q8H  . influenza vaccine adjuvanted  0.5 mL Intramuscular Tomorrow-1000  . Ipratropium-Albuterol  1 puff Inhalation TID  . latanoprost  1 drop Both Eyes QHS  . methylPREDNISolone (SOLU-MEDROL) injection  40 mg Intravenous Q12H  . metoprolol tartrate  12.5 mg Oral BID  . multivitamin  1 tablet Oral Daily  . multivitamin-iron-minerals-folic acid  1 tablet Oral q morning - 10a  . pantoprazole  40 mg Oral Daily  . pneumococcal 23 valent vaccine  0.5 mL Intramuscular Tomorrow-1000  . sertraline  50 mg Oral Daily  . vitamin B-12  500 mcg Oral Daily  . zinc sulfate  220 mg Oral Daily   Continuous Infusions:   LOS: 4 days    Time spent: 35 minutes    Barb Merino, MD [facility] Pager [phone]

## 2019-01-13 LAB — CBC WITH DIFFERENTIAL/PLATELET
Abs Immature Granulocytes: 0.07 10*3/uL (ref 0.00–0.07)
Basophils Absolute: 0 10*3/uL (ref 0.0–0.1)
Basophils Relative: 0 %
Eosinophils Absolute: 0 10*3/uL (ref 0.0–0.5)
Eosinophils Relative: 0 %
HCT: 38.9 % (ref 36.0–46.0)
Hemoglobin: 12.5 g/dL (ref 12.0–15.0)
Immature Granulocytes: 1 %
Lymphocytes Relative: 3 %
Lymphs Abs: 0.3 10*3/uL — ABNORMAL LOW (ref 0.7–4.0)
MCH: 29.2 pg (ref 26.0–34.0)
MCHC: 32.1 g/dL (ref 30.0–36.0)
MCV: 90.9 fL (ref 80.0–100.0)
Monocytes Absolute: 0.1 10*3/uL (ref 0.1–1.0)
Monocytes Relative: 1 %
Neutro Abs: 10.9 10*3/uL — ABNORMAL HIGH (ref 1.7–7.7)
Neutrophils Relative %: 95 %
Platelets: 204 10*3/uL (ref 150–400)
RBC: 4.28 MIL/uL (ref 3.87–5.11)
RDW: 15.3 % (ref 11.5–15.5)
WBC: 11.4 10*3/uL — ABNORMAL HIGH (ref 4.0–10.5)
nRBC: 0 % (ref 0.0–0.2)

## 2019-01-13 LAB — COMPREHENSIVE METABOLIC PANEL
ALT: 24 U/L (ref 0–44)
AST: 26 U/L (ref 15–41)
Albumin: 3 g/dL — ABNORMAL LOW (ref 3.5–5.0)
Alkaline Phosphatase: 82 U/L (ref 38–126)
Anion gap: 13 (ref 5–15)
BUN: 82 mg/dL — ABNORMAL HIGH (ref 8–23)
CO2: 24 mmol/L (ref 22–32)
Calcium: 8.8 mg/dL — ABNORMAL LOW (ref 8.9–10.3)
Chloride: 105 mmol/L (ref 98–111)
Creatinine, Ser: 1.41 mg/dL — ABNORMAL HIGH (ref 0.44–1.00)
GFR calc Af Amer: 36 mL/min — ABNORMAL LOW (ref >60)
GFR calc non Af Amer: 31 mL/min — ABNORMAL LOW (ref >60)
Glucose, Bld: 133 mg/dL — ABNORMAL HIGH (ref 70–99)
Potassium: 3.8 mmol/L (ref 3.5–5.1)
Sodium: 142 mmol/L (ref 135–145)
Total Bilirubin: 0.6 mg/dL (ref 0.3–1.2)
Total Protein: 6.3 g/dL — ABNORMAL LOW (ref 6.5–8.1)

## 2019-01-13 LAB — CULTURE, BLOOD (ROUTINE X 2)
Culture: NO GROWTH
Culture: NO GROWTH
Special Requests: ADEQUATE
Special Requests: ADEQUATE

## 2019-01-13 LAB — FERRITIN: Ferritin: 343 ng/mL — ABNORMAL HIGH (ref 11–307)

## 2019-01-13 LAB — C-REACTIVE PROTEIN: CRP: 7.8 mg/dL — ABNORMAL HIGH (ref <1.0)

## 2019-01-13 LAB — D-DIMER, QUANTITATIVE: D-Dimer, Quant: 0.68 ug/mL-FEU — ABNORMAL HIGH (ref 0.00–0.50)

## 2019-01-13 NOTE — Progress Notes (Signed)
PROGRESS NOTE    Sheri Hensley  [id number] DOB: 1919/03/28 DOA: 01/08/2019 PCP: Josetta Huddle, MD    Brief Narrative:  85 year old female with history of PAF, breast cancer status post mastectomy on tamoxifen, endometrial cancer, CAD, glaucoma, HTN, HLD, narrow complex tachycardia, prior TIAs, prior C. difficile infection came into the hospital with fevers, cough, nausea and vomiting along with decreased appetite for the past week.  COVID-19 positive.  Chest x-ray on admission consistent with atypical pneumonia pattern. She was placed on remdesivir and Decadron and admitted to the hospital.  Patient living independent in an senior apartment with her daughter nearby. 01/11/2019: Rapid A. fib.  Delirium, increasing oxygen requirement.  Transfer to [facility].   Assessment & Plan:   Active Problems:   Endometrial cancer ([place])   TIA (transient ischemic attack)   Essential hypertension   Paroxysmal atrial fibrillation (HCC)   Acute respiratory failure with hypoxia (HCC)   COVID-19 virus infection   Pneumonia due to COVID-19 virus  Pneumonia due to COVID-19 virus, acute hypoxemic respiratory failure: Continue to monitor due to significant symptoms, oxygen requirement improving. chest physiotherapy, incentive spirometry, deep breathing exercises, sputum induction, mucolytic's and bronchodilators. Supplemental oxygen to keep saturations more than 90%. Covid directed therapy with , steroids, on Solu-Medrol remdesivir, finished therapy. actemra, 1 dose given on 01/11/2019. antibiotics, not indicated. Due to severity of symptoms, patient will need continuous monitoring.  Mobilize.  Start working with therapies today. CTA negative for pulmonary embolism.  Paroxysmal A. fib, with RVR: Patient on amiodarone and metoprolol at home.  Developed rapid A. Fib. Heart rate is acceptable after resuming amiodarone and metoprolol.  Not on anticoagulation.  Coronary artery  disease: Is stable now.  On Plavix, beta-blockers and statin.  Delirium: Patient developed delirium due to acute illnesses, worsened by medications.  Symptomatically improving.  Frequent orientation, regulate day and night cycles. Orientation better today.  DVT prophylaxis: Heparin subcu Code Status: DNR Family Communication: Patient's daughter on the phone Disposition Plan: Pending clinical improvement.  Mobilize with therapies today. Transfer to telemetry bed.   Consultants:   PCCM, signed off  Procedures:   None  Antimicrobials:  Anti-infectives (From admission, onward)   Start     Dose/Rate Route Frequency Ordered Stop   01/09/19 1000  remdesivir 100 mg in sodium chloride 0.9 % 100 mL IVPB     100 mg 200 mL/hr over 30 Minutes Intravenous Daily 01/08/19 0856 01/12/19 1032   01/08/19 1000  remdesivir 200 mg in sodium chloride 0.9% 250 mL IVPB     200 mg 580 mL/hr over 30 Minutes Intravenous Once 01/08/19 0855 01/08/19 1056         Subjective: Patient seen and examined.  No overnight events.  Has some cough.  Heart rate is A. fib but acceptable.  Mostly on 2 to 3 L of oxygen. "You think I may die?  Not this time, this time I will go home"  Objective: Vitals:   01/13/19 0030 01/13/19 0400 01/13/19 0502 01/13/19 0828  BP: 124/77 (!) 134/97  137/82  Pulse: 98  98 (!) 102  Resp: (!) 22  (!) 24 (!) 21  Temp: 98.9 F (37.2 C)  97.7 F (36.5 C) 97.8 F (36.6 C)  TempSrc: Oral  Oral Oral  SpO2: 92%  91% 94%  Weight:      Height:        Intake/Output Summary (Last 24 hours) at 01/13/2019 1230 Last data filed at 01/13/2019 1200 Gross per 24  hour  Intake 360 ml  Output 600 ml  Net -240 ml   Filed Weights   01/08/19 0248 01/08/19 0845  Weight: 56.7 kg 53.4 kg    Examination:  General exam: Appears calm and comfortable, appropriate looking as 85 year old.  On 2 L oxygen. Respiratory system: Clear to auscultation. Respiratory effort normal.  Mostly conducted  upper airway sounds. Cardiovascular system: S1 & S2 heard, irregularly irregular.  No JVD, murmurs, rubs, gallops or clicks. No pedal edema. Gastrointestinal system: Abdomen is nondistended, soft and nontender. No organomegaly or masses felt. Normal bowel sounds heard. Central nervous system: Alert and oriented. No focal neurological deficits. Extremities: Symmetric 5 x 5 power. Skin: No rashes, lesions or ulcers Psychiatry: Judgement and insight appear normal. Mood & affect flat.    Data Reviewed: I have personally reviewed following labs and imaging studies  CBC: Recent Labs  Lab 01/08/19 0358 01/09/19 0327 01/10/19 0249 01/11/19 0227 01/13/19 0452  WBC 5.7 4.1 10.8* 8.7 11.4*  NEUTROABS 5.1 3.4 9.9* 7.9* 10.9*  HGB 12.7 11.1* 11.2* 11.6* 12.5  HCT 39.8 36.4 35.7* 36.0 38.9  MCV 93.6 94.3 93.2 89.1 90.9  PLT 112* 107* 142* 141* 0000000   Basic Metabolic Panel: Recent Labs  Lab 01/08/19 0358 01/09/19 0327 01/10/19 0249 01/11/19 0227 01/13/19 0452  NA 140 141 140 140 142  K 3.7 3.8 4.0 3.5 3.8  CL 109 111 107 107 105  CO2 22 20* 22 21* 24  GLUCOSE 136* 116* 108* 116* 133*  BUN 26* 30* 42* 53* 82*  CREATININE 1.05* 0.84 1.43* 1.39* 1.41*  CALCIUM 9.0 8.8* 9.0 8.6* 8.8*  MG  --  2.0 1.8 1.9  --   PHOS  --  3.6 2.9 2.7  --    GFR: Estimated Creatinine Clearance: 19.2 mL/min (A) (by C-G formula based on SCr of 1.41 mg/dL (H)). Liver Function Tests: Recent Labs  Lab 01/08/19 0358 01/09/19 0327 01/10/19 0249 01/11/19 0227 01/13/19 0452  AST 28 25 30  37 26  ALT 19 17 16 20 24   ALKPHOS 94 74 70 70 82  BILITOT 0.6 0.8 0.6 0.8 0.6  PROT 7.4 6.3* 6.4* 6.2* 6.3*  ALBUMIN 4.1 3.4* 3.3* 3.0* 3.0*   No results for input(s): LIPASE, AMYLASE in the last 168 hours. No results for input(s): AMMONIA in the last 168 hours. Coagulation Profile: No results for input(s): INR, PROTIME in the last 168 hours. Cardiac Enzymes: No results for input(s): CKTOTAL, CKMB, CKMBINDEX,  TROPONINI in the last 168 hours. BNP (last 3 results) No results for input(s): PROBNP in the last 8760 hours. HbA1C: No results for input(s): HGBA1C in the last 72 hours. CBG: Recent Labs  Lab 01/12/19 0456 01/12/19 0757 01/12/19 1212 01/12/19 1650 01/12/19 2019  GLUCAP 156* 138* 165* 178* 123*   Lipid Profile: No results for input(s): CHOL, HDL, LDLCALC, TRIG, CHOLHDL, LDLDIRECT in the last 72 hours. Thyroid Function Tests: No results for input(s): TSH, T4TOTAL, FREET4, T3FREE, THYROIDAB in the last 72 hours. Anemia Panel: Recent Labs    01/11/19 0227 01/13/19 0452  FERRITIN 446* 343*   Sepsis Labs: Recent Labs  Lab 01/08/19 0357 01/08/19 0358  PROCALCITON  --  <0.10  LATICACIDVEN 1.1  --     Recent Results (from the past 240 hour(s))  Blood Culture (routine x 2)     Status: None   Collection Time: 01/08/19  3:58 AM   Specimen: BLOOD  Result Value Ref Range Status   Specimen Description   Final  BLOOD BLOOD RIGHT FOREARM Performed at [facility] [address] [place], [place] [address]    Special Requests   Final    BOTTLES DRAWN AEROBIC AND ANAEROBIC Blood Culture adequate volume Performed at [facility] [address], [place], [place] [address]    Culture   Final    NO GROWTH 5 DAYS Performed at [facility], [place] [address], [place], [place] [address]    Report Status 01/13/2019 FINAL  Final  Blood Culture (routine x 2)     Status: None   Collection Time: 01/08/19  9:01 AM   Specimen: BLOOD LEFT HAND  Result Value Ref Range Status   Specimen Description   Final    BLOOD LEFT HAND Performed at [facility] [address], [place], [place] [address]    Special Requests   Final    BOTTLES DRAWN AEROBIC AND ANAEROBIC Blood Culture adequate volume Performed at [facility] [address], [place], [place] [address]    Culture   Final    NO GROWTH 5  DAYS Performed at [facility], [place] [address] [place], [place] [address]    Report Status 01/13/2019 FINAL  Final         Radiology Studies: CT ANGIO CHEST PE W OR WO CONTRAST  Result Date: 01/11/2019 CLINICAL DATA:  COVID pneumonia. Shortness of breath. EXAM: CT ANGIOGRAPHY CHEST WITH CONTRAST TECHNIQUE: Multidetector CT imaging of the chest was performed using the standard protocol during bolus administration of intravenous contrast. Multiplanar CT image reconstructions and MIPs were obtained to evaluate the vascular anatomy. CONTRAST:  86mL OMNIPAQUE IOHEXOL 350 MG/ML SOLN COMPARISON:  CT chest 05/31/2016 FINDINGS: Cardiovascular: Heart size mildly enlarged. No substantial pericardial effusion. Coronary artery calcification is evident. Atherosclerotic calcification is noted in the wall of the thoracic aorta. No filling defect in the opacified pulmonary arteries to suggest the presence of an acute pulmonary embolus. Mediastinum/Nodes: No mediastinal lymphadenopathy. 1.4 cm right thyroid nodule similar to prior and likely not clinically relevant in this individual. There is no hilar lymphadenopathy. The esophagus has normal imaging features. There is no axillary lymphadenopathy. Lungs/Pleura: Patchy ground-glass airspace opacity noted in the lungs bilaterally. No associated pleural effusion. Upper Abdomen: Unremarkable. Musculoskeletal: No worrisome lytic or sclerotic osseous abnormality.Superior endplate compression deformity at T2, T4, T5, and T6 is new since prior study. Review of the MIP images confirms the above findings. IMPRESSION: 1. No CT evidence for acute pulmonary embolus. 2. Patchy ground-glass airspace opacity in the lungs bilaterally. Imaging features compatible with reported clinical history of COVID-19 pneumonia. 3. Aortic Atherosclerosis (ICD10-I70.0). Electronically Signed   By: Misty Stanley M.D.   On: 01/11/2019 16:09        Scheduled Meds: . amiodarone  100 mg  Oral q morning - 10a  . vitamin C  500 mg Oral Daily  . atorvastatin  10 mg Oral QHS  . brimonidine  1 drop Both Eyes Q8H  . clopidogrel  75 mg Oral Daily  . heparin injection (subcutaneous)  5,000 Units Subcutaneous Q8H  . influenza vaccine adjuvanted  0.5 mL Intramuscular Tomorrow-1000  . Ipratropium-Albuterol  1 puff Inhalation TID  . latanoprost  1 drop Both Eyes QHS  . methylPREDNISolone (SOLU-MEDROL) injection  40 mg Intravenous Q12H  . metoprolol tartrate  12.5 mg Oral BID  . multivitamin  1 tablet Oral Daily  . multivitamin with minerals  1 tablet Oral Daily  . pantoprazole  40 mg Oral Daily  . pneumococcal 23 valent vaccine  0.5 mL Intramuscular Tomorrow-1000  . sertraline  50 mg Oral Daily  . vitamin B-12  500 mcg Oral Daily  . zinc sulfate  220 mg Oral Daily   Continuous Infusions:   LOS: 5 days    Time spent: 30 minutes    Barb Merino, MD [facility] Pager [phone]

## 2019-01-13 NOTE — Progress Notes (Signed)
Notified family of progress all questions answered and this nurse's number shared for further communication. Patient face timed with family.

## 2019-01-14 LAB — COMPREHENSIVE METABOLIC PANEL
ALT: 25 U/L (ref 0–44)
AST: 25 U/L (ref 15–41)
Albumin: 3.3 g/dL — ABNORMAL LOW (ref 3.5–5.0)
Alkaline Phosphatase: 81 U/L (ref 38–126)
Anion gap: 12 (ref 5–15)
BUN: 77 mg/dL — ABNORMAL HIGH (ref 8–23)
CO2: 23 mmol/L (ref 22–32)
Calcium: 9 mg/dL (ref 8.9–10.3)
Chloride: 105 mmol/L (ref 98–111)
Creatinine, Ser: 1.25 mg/dL — ABNORMAL HIGH (ref 0.44–1.00)
GFR calc Af Amer: 42 mL/min — ABNORMAL LOW (ref >60)
GFR calc non Af Amer: 36 mL/min — ABNORMAL LOW (ref >60)
Glucose, Bld: 125 mg/dL — ABNORMAL HIGH (ref 70–99)
Potassium: 3.8 mmol/L (ref 3.5–5.1)
Sodium: 140 mmol/L (ref 135–145)
Total Bilirubin: 1 mg/dL (ref 0.3–1.2)
Total Protein: 6.6 g/dL (ref 6.5–8.1)

## 2019-01-14 LAB — CBC WITH DIFFERENTIAL/PLATELET
Abs Immature Granulocytes: 0.09 10*3/uL — ABNORMAL HIGH (ref 0.00–0.07)
Basophils Absolute: 0 10*3/uL (ref 0.0–0.1)
Basophils Relative: 0 %
Eosinophils Absolute: 0 10*3/uL (ref 0.0–0.5)
Eosinophils Relative: 0 %
HCT: 41.3 % (ref 36.0–46.0)
Hemoglobin: 13.1 g/dL (ref 12.0–15.0)
Immature Granulocytes: 1 %
Lymphocytes Relative: 3 %
Lymphs Abs: 0.4 10*3/uL — ABNORMAL LOW (ref 0.7–4.0)
MCH: 29.1 pg (ref 26.0–34.0)
MCHC: 31.7 g/dL (ref 30.0–36.0)
MCV: 91.8 fL (ref 80.0–100.0)
Monocytes Absolute: 0.3 10*3/uL (ref 0.1–1.0)
Monocytes Relative: 2 %
Neutro Abs: 10.8 10*3/uL — ABNORMAL HIGH (ref 1.7–7.7)
Neutrophils Relative %: 94 %
Platelets: 222 10*3/uL (ref 150–400)
RBC: 4.5 MIL/uL (ref 3.87–5.11)
RDW: 15.2 % (ref 11.5–15.5)
WBC: 11.6 10*3/uL — ABNORMAL HIGH (ref 4.0–10.5)
nRBC: 0 % (ref 0.0–0.2)

## 2019-01-14 LAB — D-DIMER, QUANTITATIVE: D-Dimer, Quant: 0.52 ug/mL-FEU — ABNORMAL HIGH (ref 0.00–0.50)

## 2019-01-14 LAB — C-REACTIVE PROTEIN: CRP: 5.6 mg/dL — ABNORMAL HIGH (ref <1.0)

## 2019-01-14 LAB — FERRITIN: Ferritin: 288 ng/mL (ref 11–307)

## 2019-01-14 NOTE — Progress Notes (Signed)
PROGRESS NOTE    Sheri Hensley  [id number] DOB: 08-Feb-1919 DOA: 01/08/2019 PCP: Josetta Huddle, MD    Brief Narrative:  85 year old female with history of PAF, breast cancer status post mastectomy on tamoxifen, endometrial cancer, CAD, glaucoma, HTN, HLD, narrow complex tachycardia, prior TIAs, prior C. difficile infection came into the hospital with fevers, cough, nausea and vomiting along with decreased appetite for the past week.  COVID-19 positive.  Chest x-ray on admission consistent with atypical pneumonia pattern. She was placed on remdesivir and Decadron and admitted to the hospital.  Patient living independent in an senior apartment with her daughter nearby. 01/11/2019: Rapid A. fib.  Delirium, increasing oxygen requirement.  Transfer to [facility].   Assessment & Plan:   Active Problems:   Endometrial cancer ([place])   TIA (transient ischemic attack)   Essential hypertension   Paroxysmal atrial fibrillation (HCC)   Acute respiratory failure with hypoxia (HCC)   COVID-19 virus infection   Pneumonia due to COVID-19 virus  Pneumonia due to COVID-19 virus, acute hypoxemic respiratory failure: Continue to monitor due to significant symptoms, oxygen requirement improving today and mostly remains on room air. chest physiotherapy, incentive spirometry, deep breathing exercises, sputum induction, mucolytic's and bronchodilators. Supplemental oxygen to keep saturations more than 90%. Covid directed therapy with , steroids, on Solu-Medrol remdesivir, finished therapy. actemra, 1 dose given on 01/11/2019. antibiotics, not indicated. CTA negative for PE. With continues clinical improvement, mobilize.  She has to work with therapist today.  Paroxysmal A. fib, with RVR: Patient on amiodarone and metoprolol at home.  Developed rapid A. Fib. Heart rate is acceptable after resuming amiodarone and metoprolol.  Not on anticoagulation.  Coronary artery disease: Is  stable now.  On Plavix, beta-blockers and statin.  Delirium: Patient developed delirium due to acute illnesses, worsened by medications.  Symptomatically improving.  Frequent orientation, regulate day and night cycles. Improved.  DVT prophylaxis: Heparin subcu Code Status: DNR Family Communication: Patient's daughter on the phone Disposition Plan: Pending clinical improvement.  Mobilize with therapies today. Home with home health versus SNF.   Consultants:   PCCM, signed off  Procedures:   None  Antimicrobials:  Anti-infectives (From admission, onward)   Start     Dose/Rate Route Frequency Ordered Stop   01/09/19 1000  remdesivir 100 mg in sodium chloride 0.9 % 100 mL IVPB     100 mg 200 mL/hr over 30 Minutes Intravenous Daily 01/08/19 0856 01/12/19 1032   01/08/19 1000  remdesivir 200 mg in sodium chloride 0.9% 250 mL IVPB     200 mg 580 mL/hr over 30 Minutes Intravenous Once 01/08/19 0855 01/08/19 1056         Subjective: Patient seen and examined.  No overnight events.  She was sitting in chair eating her breakfast.  Heart rate is occasionally A. fib but acceptable.  Remains afebrile.  Mostly on room air for last 24 hours.  Objective: Vitals:   01/13/19 1529 01/13/19 2007 01/14/19 0340 01/14/19 0759  BP: 139/77 (!) 124/98 (!) 149/94 133/86  Pulse: (!) 57 88 (!) 33 (!) 49  Resp: 20 (!) 25 (!) 22 19  Temp: 97.7 F (36.5 C) 98.8 F (37.1 C) 98.1 F (36.7 C) 98 F (36.7 C)  TempSrc: Oral Oral Oral Oral  SpO2: 95% 92% 92% 93%  Weight:      Height:        Intake/Output Summary (Last 24 hours) at 01/14/2019 1149 Last data filed at 01/14/2019 0900 Gross per 24  hour  Intake 480 ml  Output 400 ml  Net 80 ml   Filed Weights   01/08/19 0248 01/08/19 0845  Weight: 56.7 kg 53.4 kg    Examination:  General exam: Comfortable, on room air with occasional cough.  Eating breakfast.   Respiratory system: Clear to auscultation. Respiratory effort normal.  Mostly  conducted upper airway sounds. Cardiovascular system: S1 & S2 heard, irregularly irregular.  No JVD, murmurs, rubs, gallops or clicks. No pedal edema. Gastrointestinal system: Abdomen is nondistended, soft and nontender. No organomegaly or masses felt. Normal bowel sounds heard. Central nervous system: Alert and oriented. No focal neurological deficits. Extremities: Symmetric 5 x 5 power. Skin: No rashes, lesions or ulcers Psychiatry: Judgement and insight appear normal. Mood & affect normal.    Data Reviewed: I have personally reviewed following labs and imaging studies  CBC: Recent Labs  Lab 01/09/19 0327 01/10/19 0249 01/11/19 0227 01/13/19 0452 01/14/19 0242  WBC 4.1 10.8* 8.7 11.4* 11.6*  NEUTROABS 3.4 9.9* 7.9* 10.9* 10.8*  HGB 11.1* 11.2* 11.6* 12.5 13.1  HCT 36.4 35.7* 36.0 38.9 41.3  MCV 94.3 93.2 89.1 90.9 91.8  PLT 107* 142* 141* 204 [id number]   Basic Metabolic Panel: Recent Labs  Lab 01/09/19 0327 01/10/19 0249 01/11/19 0227 01/13/19 0452 01/14/19 0242  NA 141 140 140 142 140  K 3.8 4.0 3.5 3.8 3.8  CL 111 107 107 105 105  CO2 20* 22 21* 24 23  GLUCOSE 116* 108* 116* 133* 125*  BUN 30* 42* 53* 82* 77*  CREATININE 0.84 1.43* 1.39* 1.41* 1.25*  CALCIUM 8.8* 9.0 8.6* 8.8* 9.0  MG 2.0 1.8 1.9  --   --   PHOS 3.6 2.9 2.7  --   --    GFR: Estimated Creatinine Clearance: 21.7 mL/min (A) (by C-G formula based on SCr of 1.25 mg/dL (H)). Liver Function Tests: Recent Labs  Lab 01/09/19 0327 01/10/19 0249 01/11/19 0227 01/13/19 0452 01/14/19 0242  AST 25 30 37 26 25  ALT 17 16 20 24 25   ALKPHOS 74 70 70 82 81  BILITOT 0.8 0.6 0.8 0.6 1.0  PROT 6.3* 6.4* 6.2* 6.3* 6.6  ALBUMIN 3.4* 3.3* 3.0* 3.0* 3.3*   No results for input(s): LIPASE, AMYLASE in the last 168 hours. No results for input(s): AMMONIA in the last 168 hours. Coagulation Profile: No results for input(s): INR, PROTIME in the last 168 hours. Cardiac Enzymes: No results for input(s): CKTOTAL,  CKMB, CKMBINDEX, TROPONINI in the last 168 hours. BNP (last 3 results) No results for input(s): PROBNP in the last 8760 hours. HbA1C: No results for input(s): HGBA1C in the last 72 hours. CBG: Recent Labs  Lab 01/12/19 0456 01/12/19 0757 01/12/19 1212 01/12/19 1650 01/12/19 2019  GLUCAP 156* 138* 165* 178* 123*   Lipid Profile: No results for input(s): CHOL, HDL, LDLCALC, TRIG, CHOLHDL, LDLDIRECT in the last 72 hours. Thyroid Function Tests: No results for input(s): TSH, T4TOTAL, FREET4, T3FREE, THYROIDAB in the last 72 hours. Anemia Panel: Recent Labs    01/13/19 0452 01/14/19 0242  FERRITIN 343* 288   Sepsis Labs: Recent Labs  Lab 01/08/19 0357 01/08/19 0358  PROCALCITON  --  <0.10  LATICACIDVEN 1.1  --     Recent Results (from the past 240 hour(s))  Blood Culture (routine x 2)     Status: None   Collection Time: 01/08/19  3:58 AM   Specimen: BLOOD  Result Value Ref Range Status   Specimen Description   Final  BLOOD BLOOD RIGHT FOREARM Performed at [facility] [address], [place], [place] [address]    Special Requests   Final    BOTTLES DRAWN AEROBIC AND ANAEROBIC Blood Culture adequate volume Performed at [facility] [address] [place], [place] [address]    Culture   Final    NO GROWTH 5 DAYS Performed at [facility], [place] [address], [place], [place] [address]    Report Status 01/13/2019 FINAL  Final  Blood Culture (routine x 2)     Status: None   Collection Time: 01/08/19  9:01 AM   Specimen: BLOOD LEFT HAND  Result Value Ref Range Status   Specimen Description   Final    BLOOD LEFT HAND Performed at [facility] [address], [place], [place] [address]    Special Requests   Final    BOTTLES DRAWN AEROBIC AND ANAEROBIC Blood Culture adequate volume Performed at [facility] [address], [place], [place] [address]    Culture   Final    NO GROWTH  5 DAYS Performed at [facility], [place] [address] [place],  [address]    Report Status 01/13/2019 FINAL  Final         Radiology Studies: No results found.      Scheduled Meds: . amiodarone  100 mg Oral q morning - 10a  . vitamin C  500 mg Oral Daily  . atorvastatin  10 mg Oral QHS  . brimonidine  1 drop Both Eyes Q8H  . clopidogrel  75 mg Oral Daily  . heparin injection (subcutaneous)  5,000 Units Subcutaneous Q8H  . influenza vaccine adjuvanted  0.5 mL Intramuscular Tomorrow-1000  . Ipratropium-Albuterol  1 puff Inhalation TID  . latanoprost  1 drop Both Eyes QHS  . methylPREDNISolone (SOLU-MEDROL) injection  40 mg Intravenous Q12H  . metoprolol tartrate  12.5 mg Oral BID  . multivitamin  1 tablet Oral Daily  . multivitamin with minerals  1 tablet Oral Daily  . pantoprazole  40 mg Oral Daily  . pneumococcal 23 valent vaccine  0.5 mL Intramuscular Tomorrow-1000  . sertraline  50 mg Oral Daily  . vitamin B-12  500 mcg Oral Daily  . zinc sulfate  220 mg Oral Daily   Continuous Infusions:   LOS: 6 days    Time spent: 30 minutes    Barb Merino, MD [facility] Pager [phone]

## 2019-01-15 LAB — COMPREHENSIVE METABOLIC PANEL
ALT: 22 U/L (ref 0–44)
AST: 24 U/L (ref 15–41)
Albumin: 3.2 g/dL — ABNORMAL LOW (ref 3.5–5.0)
Alkaline Phosphatase: 71 U/L (ref 38–126)
Anion gap: 10 (ref 5–15)
BUN: 67 mg/dL — ABNORMAL HIGH (ref 8–23)
CO2: 22 mmol/L (ref 22–32)
Calcium: 8.6 mg/dL — ABNORMAL LOW (ref 8.9–10.3)
Chloride: 108 mmol/L (ref 98–111)
Creatinine, Ser: 1.27 mg/dL — ABNORMAL HIGH (ref 0.44–1.00)
GFR calc Af Amer: 41 mL/min — ABNORMAL LOW (ref >60)
GFR calc non Af Amer: 35 mL/min — ABNORMAL LOW (ref >60)
Glucose, Bld: 124 mg/dL — ABNORMAL HIGH (ref 70–99)
Potassium: 4.2 mmol/L (ref 3.5–5.1)
Sodium: 140 mmol/L (ref 135–145)
Total Bilirubin: 1.2 mg/dL (ref 0.3–1.2)
Total Protein: 5.6 g/dL — ABNORMAL LOW (ref 6.5–8.1)

## 2019-01-15 LAB — CBC WITH DIFFERENTIAL/PLATELET
Abs Immature Granulocytes: 0.19 10*3/uL — ABNORMAL HIGH (ref 0.00–0.07)
Basophils Absolute: 0 10*3/uL (ref 0.0–0.1)
Basophils Relative: 0 %
Eosinophils Absolute: 0 10*3/uL (ref 0.0–0.5)
Eosinophils Relative: 0 %
HCT: 36.5 % (ref 36.0–46.0)
Hemoglobin: 11.7 g/dL — ABNORMAL LOW (ref 12.0–15.0)
Immature Granulocytes: 1 %
Lymphocytes Relative: 3 %
Lymphs Abs: 0.4 10*3/uL — ABNORMAL LOW (ref 0.7–4.0)
MCH: 29.1 pg (ref 26.0–34.0)
MCHC: 32.1 g/dL (ref 30.0–36.0)
MCV: 90.8 fL (ref 80.0–100.0)
Monocytes Absolute: 0.4 10*3/uL (ref 0.1–1.0)
Monocytes Relative: 3 %
Neutro Abs: 12.4 10*3/uL — ABNORMAL HIGH (ref 1.7–7.7)
Neutrophils Relative %: 93 %
Platelets: 209 10*3/uL (ref 150–400)
RBC: 4.02 MIL/uL (ref 3.87–5.11)
RDW: 15 % (ref 11.5–15.5)
WBC: 13.4 10*3/uL — ABNORMAL HIGH (ref 4.0–10.5)
nRBC: 0 % (ref 0.0–0.2)

## 2019-01-15 LAB — C-REACTIVE PROTEIN: CRP: 3.1 mg/dL — ABNORMAL HIGH (ref <1.0)

## 2019-01-15 LAB — FERRITIN: Ferritin: 285 ng/mL (ref 11–307)

## 2019-01-15 LAB — D-DIMER, QUANTITATIVE: D-Dimer, Quant: 0.77 ug/mL-FEU — ABNORMAL HIGH (ref 0.00–0.50)

## 2019-01-15 NOTE — Care Management (Signed)
Case manager notified that [facility] [employer] [facility]. CM spoke with patient's daughter and received permission to fax her to [facility] in [place]. They are not interested in [place], patient's husband passed there. Case manager will continue to monitor.   Ricki Miller, RN BSN Case Manager [phone]

## 2019-01-15 NOTE — Progress Notes (Signed)
Physical Therapy Treatment Patient Details Name: Sheri Hensley MRN: [id number] DOB: 10-Oct-1919 Today's Date: 01/15/2019    History of Present Illness 85 yo female admitted with acute respiratory failure 2* COVID. Hx of A fib, breast ca/mastectomy, TIA, CAD, macular degeneration.    PT Comments    Pt is progressing with her gait and mobility.  She was on RA with VSS today walking a short distance around the room.  Heavy min assist for balance and transitions.  Seated exercises and breathing exercises reviewed.  She remains appropriate for post acute rehab.  PT will continue to follow acutely for safe mobility progression.  I have asked for an OT consult as she lived alone PTA and was likely doing at least some of her ADLs.  Marland Kitchenpttof  Follow Up Recommendations  SNF     Equipment Recommendations  None recommended by PT    Recommendations for Other Services   OT Eval     Precautions / Restrictions Precautions Precautions: Fall Precaution Comments: monitor O2    Mobility  Bed Mobility               General bed mobility comments: Pt was OOB in the recliner chair.   Transfers Overall transfer level: Needs assistance Equipment used: Rolling walker (2 wheeled) Transfers: Sit to/from Stand Sit to Stand: Min assist         General transfer comment: Heavy min assist for sit to stand x 3 for peri care, clean up and gait.   Ambulation/Gait Ambulation/Gait assistance: Min assist Gait Distance (Feet): 15 Feet Assistive device: Rolling walker (2 wheeled) Gait Pattern/deviations: Step-through pattern;Decreased stride length;Trunk flexed     General Gait Details: Min assist for balance.  Pt on RA, O2 sats in the 90s during gait.  HR in the 120s       Balance Overall balance assessment: Needs assistance Sitting-balance support: Feet supported;Bilateral upper extremity supported Sitting balance-Leahy Scale: Fair     Standing balance support: Bilateral upper  extremity supported Standing balance-Leahy Scale: Poor Standing balance comment: needs support from RW and therapist                            Cognition Arousal/Alertness: Awake/alert Behavior During Therapy: WFL for tasks assessed/performed Overall Cognitive Status: Within Functional Limits for tasks assessed                                        Exercises General Exercises - Upper Extremity Shoulder Flexion: AROM;Both;10 reps Elbow Flexion: AROM;Both;10 reps General Exercises - Lower Extremity Ankle Circles/Pumps: AROM;Both;10 reps Long Arc Quad: AROM;Both;10 reps Hip Flexion/Marching: AROM;Both;10 reps Other Exercises Other Exercises: IS and flutter valve x 10 reps each.  Pt max ovserved volume was 700        Pertinent Vitals/Pain Pain Assessment: No/denies pain           PT Goals (current goals can now be found in the care plan section) Acute Rehab PT Goals Patient Stated Goal: to go to rehab Progress towards PT goals: Progressing toward goals    Frequency    Min 2X/week      PT Plan Current plan remains appropriate       AM-PAC PT "6 Clicks" Mobility   Outcome Measure  Help needed turning from your back to your side while in a flat bed without using bedrails?:  A Little Help needed moving from lying on your back to sitting on the side of a flat bed without using bedrails?: A Little Help needed moving to and from a bed to a chair (including a wheelchair)?: A Little Help needed standing up from a chair using your arms (e.g., wheelchair or bedside chair)?: A Little Help needed to walk in hospital room?: A Little Help needed climbing 3-5 steps with a railing? : A Little 6 Click Score: 18    End of Session   Activity Tolerance: Patient limited by fatigue Patient left: in chair;with call bell/phone within reach   PT Visit Diagnosis: Muscle weakness (generalized) (M62.81);Difficulty in walking, not elsewhere classified  (R26.2)     Time: [id number] PT Time Calculation (min) (ACUTE ONLY): 35 min  Charges:  $Gait Training: 8-22 mins $Therapeutic Exercise: 8-22 mins           Verdene Lennert, PT, DPT  Acute Rehabilitation [phone] pager #[phone] office  @ [facility]: [phone]

## 2019-01-15 NOTE — Progress Notes (Signed)
Notified family of progress all questions answered and this nurse's number shared for further communication. Patient facetimed family.

## 2019-01-15 NOTE — Evaluation (Signed)
Occupational Therapy Evaluation Patient Details Name: Sheri Hensley MRN: [id number] DOB: 30-Apr-1919 Today's Date: 01/15/2019    History of Present Illness 85 yo female admitted with acute respiratory failure 2* COVID. Hx of A fib, breast ca/mastectomy, TIA, CAD, macular degeneration.   Clinical Impression   PTA, pt was living at her home (in a senior living) and was independent with BADLs and used a RW for functional mobility; pt with aide and her daughter who perform IADLs. Pt currently requiring Min A for LB ADLs and functional mobility with RW. Pt performing oral care at sink with Min Guard and then short distance mobility in room with Min A and RW. Pt presenting with decreased balance, strength, and activity tolerance. SpO2 >85% on RA throughout activity; maintained in 90s at rest. Pt would benefit from further acute OT to facilitate safe dc. Recommend dc to SNF for further OT to optimize safety, independence with ADLs, and return to PLOF.      Follow Up Recommendations  SNF    Equipment Recommendations  Other (comment)(Defer to next venue)    Recommendations for Other Services PT consult     Precautions / Restrictions Precautions Precautions: Fall Precaution Comments: monitor O2 Restrictions Weight Bearing Restrictions: No      Mobility Bed Mobility               General bed mobility comments: Pt was OOB in the recliner chair.   Transfers Overall transfer level: Needs assistance Equipment used: Rolling walker (2 wheeled) Transfers: Sit to/from Stand Sit to Stand: Min assist         General transfer comment: Min A for power up into standing.     Balance Overall balance assessment: Needs assistance Sitting-balance support: Feet supported;Bilateral upper extremity supported Sitting balance-Leahy Scale: Fair     Standing balance support: Bilateral upper extremity supported Standing balance-Leahy Scale: Fair Standing balance comment: Able to perform  oral care at sink with Min Guard A                           ADL either performed or assessed with clinical judgement   ADL Overall ADL's : Needs assistance/impaired Eating/Feeding: Set up;Sitting   Grooming: Min guard;Oral care;Standing Grooming Details (indicate cue type and reason): Min Guard A for safety during oral care at sink.  Upper Body Bathing: Min guard;Sitting   Lower Body Bathing: Minimal assistance;Sit to/from stand   Upper Body Dressing : Min guard;Sitting   Lower Body Dressing: Minimal assistance;Sit to/from stand Lower Body Dressing Details (indicate cue type and reason): Min A for managing underwear Toilet Transfer: Minimal assistance;Ambulation;RW(simulated to recliner) Armed forces technical officer Details (indicate cue type and reason): Min A for power up into standing         Functional mobility during ADLs: Minimal assistance;Rolling walker General ADL Comments: Pt presenting with decreased strength, balance, and activity tolerance     Vision Baseline Vision/History: Wears glasses Wears Glasses: At all times Patient Visual Report: No change from baseline       Perception     Praxis      Pertinent Vitals/Pain Pain Assessment: No/denies pain     Hand Dominance Right   Extremity/Trunk Assessment Upper Extremity Assessment Upper Extremity Assessment: Generalized weakness   Lower Extremity Assessment Lower Extremity Assessment: Generalized weakness   Cervical / Trunk Assessment Cervical / Trunk Assessment: Kyphotic   Communication Communication Communication: No difficulties   Cognition Arousal/Alertness: Awake/alert Behavior During Therapy: WFL for  tasks assessed/performed Overall Cognitive Status: Within Functional Limits for tasks assessed                                 General Comments: Pt repeating questions and noting some ST memory deficits but feel this is age appropriate.   General Comments  SpO2 >85% on RA duirng  activity. Once seated and at rest, Spo2 90s on RA    Exercises Exercises: Other exercises Other Exercises Other Exercises: flutter valve x 10 reps   Shoulder Instructions      Home Living Family/patient expects to be discharged to:: Unsure Living Arrangements: Other relatives(Aide; "I have a girl come in and help me on weekdays") Available Help at Discharge: Family;Available PRN/intermittently Type of Home: House                       Home Equipment: Walker - 2 wheels;Cane - single point          Prior Functioning/Environment Level of Independence: Independent with assistive device(s)        Comments: uses RW for ambulation. Performs BADLs and aide assists with IADLs. Daughter also lives nearby and assist with IADLs.        OT Problem List: Decreased strength;Decreased range of motion;Decreased activity tolerance;Impaired balance (sitting and/or standing);Decreased knowledge of use of DME or AE;Decreased knowledge of precautions      OT Treatment/Interventions: Self-care/ADL training;Therapeutic exercise;Energy conservation;DME and/or AE instruction;Therapeutic activities;Patient/family education    OT Goals(Current goals can be found in the care plan section) Acute Rehab OT Goals Patient Stated Goal: To get better and go home OT Goal Formulation: With patient Time For Goal Achievement: 01/29/19 Potential to Achieve Goals: Good  OT Frequency: Min 2X/week   Barriers to D/C:            Co-evaluation              AM-PAC OT "6 Clicks" Daily Activity     Outcome Measure Help from another person eating meals?: None Help from another person taking care of personal grooming?: A Little Help from another person toileting, which includes using toliet, bedpan, or urinal?: A Little Help from another person bathing (including washing, rinsing, drying)?: A Little Help from another person to put on and taking off regular upper body clothing?: A Little Help from  another person to put on and taking off regular lower body clothing?: A Little 6 Click Score: 19   End of Session Equipment Utilized During Treatment: Rolling walker Nurse Communication: Mobility status  Activity Tolerance: Patient tolerated treatment well Patient left: in chair;with call bell/phone within reach  OT Visit Diagnosis: Unsteadiness on feet (R26.81);Other abnormalities of gait and mobility (R26.89);Muscle weakness (generalized) (M62.81)                Time: [id number] OT Time Calculation (min): 30 min Charges:  OT General Charges $OT Visit: 1 Visit OT Evaluation $OT Eval Moderate Complexity: 1 Mod OT Treatments $Self Care/Home Management : 8-22 mins  Trevionne Advani MSOT, OTR/L Acute Rehab Pager: [phone] Office: [place] 01/15/2019, 4:15 PM

## 2019-01-15 NOTE — Progress Notes (Signed)
PROGRESS NOTE    Sheri Hensley  [id number] DOB: October 17, 1919 DOA: 01/08/2019 PCP: Josetta Huddle, MD    Brief Narrative:  85 year old female with history of PAF, breast cancer status post mastectomy on tamoxifen, endometrial cancer, CAD, glaucoma, HTN, HLD, narrow complex tachycardia, prior TIAs, prior C. difficile infection came into the hospital with fevers, cough, nausea and vomiting along with decreased appetite for the past week.  COVID-19 positive.  Chest x-ray on admission consistent with atypical pneumonia pattern. She was placed on remdesivir and Decadron and admitted to the hospital.  Patient living independent in an senior apartment with her daughter nearby. 01/11/2019: Rapid A. fib.  Delirium, increasing oxygen requirement.  Transfer to [employer].   Assessment & Plan:   Active Problems:   Endometrial cancer ([place])   TIA (transient ischemic attack)   Essential hypertension   Paroxysmal atrial fibrillation (HCC)   Acute respiratory failure with hypoxia (HCC)   COVID-19 virus infection   Pneumonia due to COVID-19 virus  Pneumonia due to COVID-19 virus, acute hypoxemic respiratory failure: oxygen requirement improving today and mostly remains on room air. chest physiotherapy, incentive spirometry, deep breathing exercises, sputum induction, mucolytic's and bronchodilators. Supplemental oxygen to keep saturations more than 90%. Covid directed therapy with , steroids, on Solu-Medrol remdesivir, finished therapy. actemra, 1 dose given on 01/11/2019. antibiotics, not indicated. CTA negative for PE. With continues clinical improvement, mobilize.    Paroxysmal A. fib, with RVR: Patient on amiodarone and metoprolol at home.  Developed rapid A. Fib. Heart rate is acceptable after resuming amiodarone and metoprolol.  Not on anticoagulation.  Coronary artery disease: Is stable now.  On Plavix, beta-blockers and statin.  Delirium: Patient developed delirium  due to acute illnesses, worsened by medications.  Symptomatically improving.  Frequent orientation, regulate day and night cycles. Improved.  DVT prophylaxis: Heparin subcu Code Status: DNR Family Communication: Patient's daughter on the phone 12/27 Disposition Plan: SNF . sw notified    Consultants:   PCCM, signed off  Procedures:   None  Antimicrobials:  Anti-infectives (From admission, onward)   Start     Dose/Rate Route Frequency Ordered Stop   01/09/19 1000  remdesivir 100 mg in sodium chloride 0.9 % 100 mL IVPB     100 mg 200 mL/hr over 30 Minutes Intravenous Daily 01/08/19 0856 01/12/19 1032   01/08/19 1000  remdesivir 200 mg in sodium chloride 0.9% 250 mL IVPB     200 mg 580 mL/hr over 30 Minutes Intravenous Once 01/08/19 0855 01/08/19 1056         Subjective: No overnight events. She is agreeable to go to SNF if we suggest so. Dry cough  HR 49-120 afib   Objective: Vitals:   01/14/19 1501 01/14/19 2047 01/15/19 0429 01/15/19 0733  BP: (!) 143/89  (!) 149/91 114/89  Pulse: (!) 52  65 94  Resp: 18  17 18   Temp: 97.7 F (36.5 C) 98.8 F (37.1 C) 97.9 F (36.6 C) 98 F (36.7 C)  TempSrc: Oral Oral Oral Oral  SpO2: 92%  95% 94%  Weight:      Height:       No intake or output data in the 24 hours ending 01/15/19 1121 Filed Weights   01/08/19 0248 01/08/19 0845  Weight: 56.7 kg 53.4 kg    Examination:  General exam: Comfortable, on room air with occasional cough.  Eating breakfast.   Respiratory system: Clear to auscultation. Respiratory effort normal.  Mostly conducted upper airway sounds. Cardiovascular  system: S1 & S2 heard, irregularly irregular.  No JVD, murmurs, rubs, gallops or clicks. No pedal edema. Gastrointestinal system: Abdomen is nondistended, soft and nontender. No organomegaly or masses felt. Normal bowel sounds heard. Central nervous system: Alert and oriented. No focal neurological deficits. Extremities: Symmetric 5 x 5 power. Skin:  No rashes, lesions or ulcers Psychiatry: Judgement and insight appear normal. Mood & affect normal.    Data Reviewed: I have personally reviewed following labs and imaging studies  CBC: Recent Labs  Lab 01/10/19 0249 01/11/19 0227 01/13/19 0452 01/14/19 0242 01/15/19 0230  WBC 10.8* 8.7 11.4* 11.6* 13.4*  NEUTROABS 9.9* 7.9* 10.9* 10.8* 12.4*  HGB 11.2* 11.6* 12.5 13.1 11.7*  HCT 35.7* 36.0 38.9 41.3 36.5  MCV 93.2 89.1 90.9 91.8 90.8  PLT 142* 141* 204 222 [id number]   Basic Metabolic Panel: Recent Labs  Lab 01/09/19 0327 01/10/19 0249 01/11/19 0227 01/13/19 0452 01/14/19 0242 01/15/19 0230  NA 141 140 140 142 140 140  K 3.8 4.0 3.5 3.8 3.8 4.2  CL 111 107 107 105 105 108  CO2 20* 22 21* 24 23 22   GLUCOSE 116* 108* 116* 133* 125* 124*  BUN 30* 42* 53* 82* 77* 67*  CREATININE 0.84 1.43* 1.39* 1.41* 1.25* 1.27*  CALCIUM 8.8* 9.0 8.6* 8.8* 9.0 8.6*  MG 2.0 1.8 1.9  --   --   --   PHOS 3.6 2.9 2.7  --   --   --    GFR: Estimated Creatinine Clearance: 21.3 mL/min (A) (by C-G formula based on SCr of 1.27 mg/dL (H)). Liver Function Tests: Recent Labs  Lab 01/10/19 0249 01/11/19 0227 01/13/19 0452 01/14/19 0242 01/15/19 0230  AST 30 37 26 25 24   ALT 16 20 24 25 22   ALKPHOS 70 70 82 81 71  BILITOT 0.6 0.8 0.6 1.0 1.2  PROT 6.4* 6.2* 6.3* 6.6 5.6*  ALBUMIN 3.3* 3.0* 3.0* 3.3* 3.2*   No results for input(s): LIPASE, AMYLASE in the last 168 hours. No results for input(s): AMMONIA in the last 168 hours. Coagulation Profile: No results for input(s): INR, PROTIME in the last 168 hours. Cardiac Enzymes: No results for input(s): CKTOTAL, CKMB, CKMBINDEX, TROPONINI in the last 168 hours. BNP (last 3 results) No results for input(s): PROBNP in the last 8760 hours. HbA1C: No results for input(s): HGBA1C in the last 72 hours. CBG: Recent Labs  Lab 01/12/19 0456 01/12/19 0757 01/12/19 1212 01/12/19 1650 01/12/19 2019  GLUCAP 156* 138* 165* 178* 123*   Lipid  Profile: No results for input(s): CHOL, HDL, LDLCALC, TRIG, CHOLHDL, LDLDIRECT in the last 72 hours. Thyroid Function Tests: No results for input(s): TSH, T4TOTAL, FREET4, T3FREE, THYROIDAB in the last 72 hours. Anemia Panel: Recent Labs    01/14/19 0242 01/15/19 0230  FERRITIN 288 285   Sepsis Labs: No results for input(s): PROCALCITON, LATICACIDVEN in the last 168 hours.  Recent Results (from the past 240 hour(s))  Blood Culture (routine x 2)     Status: None   Collection Time: 01/08/19  3:58 AM   Specimen: BLOOD  Result Value Ref Range Status   Specimen Description   Final    BLOOD BLOOD RIGHT FOREARM Performed at [facility] [address], [place], [place] [address]    Special Requests   Final    BOTTLES DRAWN AEROBIC AND ANAEROBIC Blood Culture adequate volume Performed at [facility] [address] [place], [place] [address]    Culture  Final    NO GROWTH 5 DAYS Performed at [facility], [place] [address] [place], [place] [address]    Report Status 01/13/2019 FINAL  Final  Blood Culture (routine x 2)     Status: None   Collection Time: 01/08/19  9:01 AM   Specimen: BLOOD LEFT HAND  Result Value Ref Range Status   Specimen Description   Final    BLOOD LEFT HAND Performed at [facility] [address] [place], [place] [address]    Special Requests   Final    BOTTLES DRAWN AEROBIC AND ANAEROBIC Blood Culture adequate volume Performed at [facility] [address], [place], [place] [address]    Culture   Final    NO GROWTH 5 DAYS Performed at [facility], [place] [address], [place],  [address]    Report Status 01/13/2019 FINAL  Final         Radiology Studies: No results found.      Scheduled Meds: . amiodarone  100 mg Oral q morning - 10a  . vitamin C  500 mg Oral Daily  . atorvastatin  10 mg Oral QHS  . brimonidine  1 drop Both Eyes Q8H  .  clopidogrel  75 mg Oral Daily  . heparin injection (subcutaneous)  5,000 Units Subcutaneous Q8H  . influenza vaccine adjuvanted  0.5 mL Intramuscular Tomorrow-1000  . Ipratropium-Albuterol  1 puff Inhalation TID  . latanoprost  1 drop Both Eyes QHS  . methylPREDNISolone (SOLU-MEDROL) injection  40 mg Intravenous Q12H  . metoprolol tartrate  12.5 mg Oral BID  . multivitamin  1 tablet Oral Daily  . multivitamin with minerals  1 tablet Oral Daily  . pantoprazole  40 mg Oral Daily  . pneumococcal 23 valent vaccine  0.5 mL Intramuscular Tomorrow-1000  . sertraline  50 mg Oral Daily  . vitamin B-12  500 mcg Oral Daily  . zinc sulfate  220 mg Oral Daily   Continuous Infusions:   LOS: 7 days    Time spent: 30 minutes    Barb Merino, MD [facility] Pager [phone]

## 2019-01-15 NOTE — NC FL2 (Addendum)
[place] LEVEL OF CARE SCREENING TOOL     IDENTIFICATION  Patient Name: Sheri Hensley Birthdate: 1919-08-27 Sex: female Admission Date (Current Location): 01/08/2019  [facility] and [place] Number:  Herbalist and Address:  [facility], [place] [address], [place], [place] [address]([address]      Provider Number: [id number]  Attending Physician Name and Address:  Barb Merino, MD  Relative Name and Phone Number:  Daughter: Mickel Fuchs [phone]    Current Level of Care: Hospital Recommended Level of Care: [place] Prior Approval Number:    Date Approved/Denied:   PASRR Number: [id number] Discharge Plan: SNF    Current Diagnoses: Patient Active Problem List   Diagnosis Date Noted  . Acute respiratory failure with hypoxia ([place]) 01/08/2019  . COVID-19 virus infection 01/08/2019  . Pneumonia due to COVID-19 virus 01/08/2019  . 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 Paroxysmal atrial fibrillation ([place]) 12/23/2015  . Hyponatremia 12/23/2015  . Elevated bilirubin 12/23/2015  . TIA (transient ischemic attack) 12/22/2015  . Endometrial cancer ([place]) 11/21/2015  . Postmenopausal bleeding 11/10/2015  . Thickened endometrium 11/10/2015  . Essential hypertension 10/24/2014    Orientation RESPIRATION BLADDER Height & Weight     Self, Time, Situation, Place  Normal Incontinent Weight: 53.4 kg Height:  5\' 5"  (165.1 cm)  BEHAVIORAL SYMPTOMS/MOOD NEUROLOGICAL BOWEL NUTRITION STATUS      Continent Diet  AMBULATORY STATUS COMMUNICATION OF NEEDS Skin   Extensive Assist Verbally Normal                       Personal Care Assistance Level of Assistance  Dressing, Bathing Bathing Assistance: Limited assistance   Dressing Assistance: Limited assistance     Functional Limitations Info             SPECIAL CARE FACTORS FREQUENCY  PT (By licensed PT), OT (By licensed  OT)     PT Frequency: 5x/week OT Frequency: min 3x/week            Contractures Contractures Info: Not present    Additional Factors Info  Code Status, Allergies Code Status Info: DNR Allergies Info: Ativan, Flagyl           Current Medications (01/15/2019):  This is the current hospital active medication list Current Facility-Administered Medications  Medication Dose Route Frequency Provider Last Rate Last Admin  . acetaminophen (TYLENOL) tablet 650 mg  650 mg Oral Q6H PRN Rai, Ripudeep K, MD   650 mg at 01/12/19 0023  . albuterol (VENTOLIN HFA) 108 (90 Base) MCG/ACT inhaler 1-2 puff  1-2 puff Inhalation Q4H PRN Rai, Ripudeep K, MD   2 puff at 01/14/19 2049  . amiodarone (PACERONE) tablet 100 mg  100 mg Oral q morning - 10a Rai, Ripudeep K, MD   100 mg at 01/15/19 0904  . ascorbic acid (VITAMIN C) tablet 500 mg  500 mg Oral Daily Rai, Ripudeep K, MD   500 mg at 01/15/19 0904  . atorvastatin (LIPITOR) tablet 10 mg  10 mg Oral QHS Rai, Ripudeep K, MD   10 mg at 01/14/19 2234  . brimonidine (ALPHAGAN) 0.15 % ophthalmic solution 1 drop  1 drop Both Eyes Q8H Rai, Ripudeep K, MD   1 drop at 01/15/19 0648  . chlorpheniramine-HYDROcodone (TUSSIONEX) 10-8 MG/5ML suspension 5 mL  5 mL Oral Q12H PRN Rai, Ripudeep K, MD   5 mL at 01/14/19 2234  .  clopidogrel (PLAVIX) tablet 75 mg  75 mg Oral Daily Rai, Ripudeep K, MD   75 mg at 01/15/19 0904  . guaiFENesin-dextromethorphan (ROBITUSSIN DM) 100-10 MG/5ML syrup 10 mL  10 mL Oral Q4H PRN Rai, Ripudeep K, MD   10 mL at 01/13/19 1210  . haloperidol lactate (HALDOL) injection 2 mg  2 mg Intravenous Q6H PRN Rai, Ripudeep K, MD   2 mg at 01/10/19 1725  . heparin injection 5,000 Units  5,000 Units Subcutaneous Q8H Rai, Ripudeep K, MD   5,000 Units at 01/15/19 [id number]  . influenza vaccine adjuvanted (FLUAD) injection 0.5 mL  0.5 mL Intramuscular Tomorrow-1000 Rai, Vernelle Emerald, MD   Stopped at 01/12/19 0955  . Ipratropium-Albuterol (COMBIVENT) respimat 1  puff  1 puff Inhalation TID Rai, Vernelle Emerald, MD   1 puff at 01/15/19 0737  . latanoprost (XALATAN) 0.005 % ophthalmic solution 1 drop  1 drop Both Eyes QHS Rai, Ripudeep K, MD   1 drop at 01/14/19 2049  . loratadine (CLARITIN) tablet 10 mg  10 mg Oral Daily PRN Rai, Ripudeep K, MD      . methylPREDNISolone sodium succinate (SOLU-MEDROL) 40 mg/mL injection 40 mg  40 mg Intravenous Q12H Rai, Ripudeep K, MD   40 mg at 01/15/19 0230  . metoprolol tartrate (LOPRESSOR) tablet 12.5 mg  12.5 mg Oral BID Barb Merino, MD   12.5 mg at 01/15/19 0904  . multivitamin (PROSIGHT) tablet 1 tablet  1 tablet Oral Daily Rai, Ripudeep K, MD   1 tablet at 01/15/19 0904  . multivitamin with minerals tablet 1 tablet  1 tablet Oral Daily Barb Merino, MD   1 tablet at 01/15/19 0904  . ondansetron (ZOFRAN) tablet 4 mg  4 mg Oral Q6H PRN Rai, Ripudeep K, MD       Or  . ondansetron (ZOFRAN) injection 4 mg  4 mg Intravenous Q6H PRN Rai, Ripudeep K, MD   4 mg at 01/08/19 1025  . pantoprazole (PROTONIX) EC tablet 40 mg  40 mg Oral Daily Rai, Ripudeep K, MD   40 mg at 01/15/19 0904  . pneumococcal 23 valent vaccine (PNEUMOVAX-23) injection 0.5 mL  0.5 mL Intramuscular Tomorrow-1000 Rai, Vernelle Emerald, MD   Stopped at 01/12/19 0955  . polyethylene glycol (MIRALAX / GLYCOLAX) packet 17 g  17 g Oral Daily PRN Rai, Ripudeep K, MD   17 g at 01/15/19 0914  . sertraline (ZOLOFT) tablet 50 mg  50 mg Oral Daily Rai, Ripudeep K, MD   50 mg at 01/15/19 0903  . traMADol (ULTRAM) tablet 100 mg  100 mg Oral Q12H PRN Rai, Ripudeep K, MD      . vitamin B-12 (CYANOCOBALAMIN) tablet 500 mcg  500 mcg Oral Daily Rai, Ripudeep K, MD   500 mcg at 01/15/19 0904  . zinc sulfate capsule 220 mg  220 mg Oral Daily Rai, Ripudeep K, MD   220 mg at 01/15/19 [id number]     Discharge Medications: Please see discharge summary for a list of discharge medications.  Relevant Imaging Results:  Relevant Lab Results:   Additional Information SSN:   [id number]  Ninfa Meeker, RN

## 2019-01-15 NOTE — TOC Progression Note (Signed)
Transition of Care [facility] - Progression Note    Patient Details  Name: CHRISSEY RINGOR MRN: [id number] Date of Birth: 1919-08-22  Transition of Care [facility]) CM/SW Contact  Loletha Grayer Beverely Pace, RN Phone Number: [phone] (working remotely) 01/15/2019, 12:51 PM  Clinical Narrative:   Patient is a 85 yr old female, admitted from home for treatment of COVID 19. Patient lives independently in a Senior living apartment, her daughter lives nearby.Md states patient is medically ready to go toshortterm rehab.  Case manager spoke with patient's daughter, Mickel Fuchs, via telephone to discuss Facility for shortterm rehab. Jocelyn Lamer asked that her mom be faxed out to [facility] and [facility]. Case Manager has sent FL2 to both facilities. Assured Jocelyn Lamer that we will contact her when we have a bed offer.          Expected Discharge Plan and Services                                                 Social Determinants of Health (SDOH) Interventions    Readmission Risk Interventions No flowsheet data found.

## 2019-01-16 LAB — D-DIMER, QUANTITATIVE: D-Dimer, Quant: 0.77 ug/mL-FEU — ABNORMAL HIGH (ref 0.00–0.50)

## 2019-01-16 LAB — CBC WITH DIFFERENTIAL/PLATELET
Abs Immature Granulocytes: 0.24 10*3/uL — ABNORMAL HIGH (ref 0.00–0.07)
Basophils Absolute: 0 10*3/uL (ref 0.0–0.1)
Basophils Relative: 0 %
Eosinophils Absolute: 0 10*3/uL (ref 0.0–0.5)
Eosinophils Relative: 0 %
HCT: 37.7 % (ref 36.0–46.0)
Hemoglobin: 12.1 g/dL (ref 12.0–15.0)
Immature Granulocytes: 2 %
Lymphocytes Relative: 3 %
Lymphs Abs: 0.4 10*3/uL — ABNORMAL LOW (ref 0.7–4.0)
MCH: 29.1 pg (ref 26.0–34.0)
MCHC: 32.1 g/dL (ref 30.0–36.0)
MCV: 90.6 fL (ref 80.0–100.0)
Monocytes Absolute: 0.4 10*3/uL (ref 0.1–1.0)
Monocytes Relative: 3 %
Neutro Abs: 11.9 10*3/uL — ABNORMAL HIGH (ref 1.7–7.7)
Neutrophils Relative %: 92 %
Platelets: 200 10*3/uL (ref 150–400)
RBC: 4.16 MIL/uL (ref 3.87–5.11)
RDW: 14.9 % (ref 11.5–15.5)
WBC: 13 10*3/uL — ABNORMAL HIGH (ref 4.0–10.5)
nRBC: 0 % (ref 0.0–0.2)

## 2019-01-16 LAB — C-REACTIVE PROTEIN: CRP: 1.9 mg/dL — ABNORMAL HIGH (ref <1.0)

## 2019-01-16 LAB — COMPREHENSIVE METABOLIC PANEL
ALT: 24 U/L (ref 0–44)
AST: 26 U/L (ref 15–41)
Albumin: 3.2 g/dL — ABNORMAL LOW (ref 3.5–5.0)
Alkaline Phosphatase: 73 U/L (ref 38–126)
Anion gap: 9 (ref 5–15)
BUN: 56 mg/dL — ABNORMAL HIGH (ref 8–23)
CO2: 23 mmol/L (ref 22–32)
Calcium: 8.6 mg/dL — ABNORMAL LOW (ref 8.9–10.3)
Chloride: 105 mmol/L (ref 98–111)
Creatinine, Ser: 1.06 mg/dL — ABNORMAL HIGH (ref 0.44–1.00)
GFR calc Af Amer: 51 mL/min — ABNORMAL LOW (ref >60)
GFR calc non Af Amer: 44 mL/min — ABNORMAL LOW (ref >60)
Glucose, Bld: 112 mg/dL — ABNORMAL HIGH (ref 70–99)
Potassium: 4.9 mmol/L (ref 3.5–5.1)
Sodium: 137 mmol/L (ref 135–145)
Total Bilirubin: 1.3 mg/dL — ABNORMAL HIGH (ref 0.3–1.2)
Total Protein: 5.7 g/dL — ABNORMAL LOW (ref 6.5–8.1)

## 2019-01-16 LAB — FERRITIN: Ferritin: 332 ng/mL — ABNORMAL HIGH (ref 11–307)

## 2019-01-16 MED ORDER — DEXAMETHASONE 4 MG PO TABS
4.0000 mg | ORAL_TABLET | Freq: Every day | ORAL | Status: AC
  Administered 2019-01-16: 4 mg via ORAL
  Filled 2019-01-16: qty 1

## 2019-01-16 MED ORDER — METOPROLOL TARTRATE 25 MG PO TABS
25.0000 mg | ORAL_TABLET | Freq: Two times a day (BID) | ORAL | Status: DC
  Administered 2019-01-16 – 2019-01-17 (×3): 25 mg via ORAL
  Filled 2019-01-16 (×3): qty 1

## 2019-01-16 NOTE — Progress Notes (Signed)
Spoke with Sheri Hensley, her daughter, and answered all questions at this time.

## 2019-01-16 NOTE — Progress Notes (Signed)
PROGRESS NOTE    Sheri Hensley  [id number] DOB: 1919/03/04 DOA: 01/08/2019 PCP: Josetta Huddle, MD    Brief Narrative:  85 year old female with history of PAF, breast cancer status post mastectomy on tamoxifen, endometrial cancer, CAD, glaucoma, HTN, HLD, narrow complex tachycardia, prior TIAs, prior C. difficile infection came into the hospital with fevers, cough, nausea and vomiting along with decreased appetite for the past week.  COVID-19 positive.  Chest x-ray on admission consistent with atypical pneumonia pattern. She was placed on remdesivir and Decadron and admitted to the hospital.  Patient living independent in an senior apartment with her daughter nearby. 01/11/2019: Rapid A. fib.  Delirium, increasing oxygen requirement.  Transferred to [facility]. Stabilizing now, waiting to go to SNF   Assessment & Plan:   Active Problems:   Endometrial cancer ([place])   TIA (transient ischemic attack)   Essential hypertension   Paroxysmal atrial fibrillation (HCC)   Acute respiratory failure with hypoxia (HCC)   COVID-19 virus infection   Pneumonia due to COVID-19 virus  Pneumonia due to COVID-19 virus, acute hypoxemic respiratory failure: oxygen requirement improving today and mostly remains on room air. Continue with chest physiotherapy, incentive spirometry, deep breathing exercises, sputum induction, mucolytic's and bronchodilators.  Not needing supplemental oxygen. Covid directed therapy with , steroids, on Solu-Medrol, day 9.  Will give 1 more dose of dexamethasone and stop. remdesivir, finished therapy. actemra, 1 dose given on 01/11/2019. antibiotics, not indicated. CTA negative for PE.  Paroxysmal A. fib, with RVR: Patient on amiodarone and metoprolol at home.  Developed rapid A. Fib. Heart rate fluctuates.  She had persistent heart rate more than 110s today morning.  Blood pressures are acceptable.   Increased dose of metoprolol to 25 mg twice a day.   Not on anticoagulation.  Coronary artery disease: Is stable now.  On Plavix, beta-blockers and statin.  Delirium: Patient developed delirium due to acute illnesses, worsened by medications.  Symptomatically improving.  Frequent orientation, regulate day and night cycles. Improved.  DVT prophylaxis: Heparin subcu Code Status: DNR Family Communication: Patient's daughter on the phone 12/28 Disposition Plan: SNF .  Medically stable for transfer.   Consultants:   PCCM, signed off  Procedures:   None  Antimicrobials:  Anti-infectives (From admission, onward)   Start     Dose/Rate Route Frequency Ordered Stop   01/09/19 1000  remdesivir 100 mg in sodium chloride 0.9 % 100 mL IVPB     100 mg 200 mL/hr over 30 Minutes Intravenous Daily 01/08/19 0856 01/12/19 1032   01/08/19 1000  remdesivir 200 mg in sodium chloride 0.9% 250 mL IVPB     200 mg 580 mL/hr over 30 Minutes Intravenous Once 01/08/19 0855 01/08/19 1056         Subjective: No overnight events. She is agreeable to go to SNF if we suggest so. Dry cough  Heart rate 110-120s.  A. Fib. She was slightly impulsive and repetitive today.  Objective: Vitals:   01/15/19 2000 01/15/19 2121 01/16/19 0450 01/16/19 0811  BP: (!) 143/78 123/90 (!) 148/88 (!) 157/123  Pulse: 89 93 100 94  Resp: 20  15 20   Temp: 97.7 F (36.5 C)  97.7 F (36.5 C) 97.7 F (36.5 C)  TempSrc: Oral  Oral Oral  SpO2: 97%  97% 93%  Weight:      Height:        Intake/Output Summary (Last 24 hours) at 01/16/2019 1346 Last data filed at 01/16/2019 0933 Gross per 24 hour  Intake 238 ml  Output 250 ml  Net -12 ml   Filed Weights   01/08/19 0248 01/08/19 0845  Weight: 56.7 kg 53.4 kg    Examination:  General exam: Comfortable, on room air with occasional cough.   Respiratory system: Clear to auscultation. Respiratory effort normal.  No added sounds.   Cardiovascular system: S1 & S2 heard, irregularly irregular.   Gastrointestinal system:  Abdomen is nondistended, soft and nontender. No organomegaly or masses felt.  Central nervous system: Alert and oriented. No focal neurological deficits. Extremities: Symmetric 5 x 5 power. Skin: No rashes, lesions or ulcers Psychiatry: Judgement and insight appear normal. Mood & affect normal.    Data Reviewed: I have personally reviewed following labs and imaging studies  CBC: Recent Labs  Lab 01/11/19 0227 01/13/19 0452 01/14/19 0242 01/15/19 0230 01/16/19 0431  WBC 8.7 11.4* 11.6* 13.4* 13.0*  NEUTROABS 7.9* 10.9* 10.8* 12.4* 11.9*  HGB 11.6* 12.5 13.1 11.7* 12.1  HCT 36.0 38.9 41.3 36.5 37.7  MCV 89.1 90.9 91.8 90.8 90.6  PLT 141* 204 222 209 [id number]   Basic Metabolic Panel: Recent Labs  Lab 01/10/19 0249 01/11/19 0227 01/13/19 0452 01/14/19 0242 01/15/19 0230 01/16/19 0431  NA 140 140 142 140 140 137  K 4.0 3.5 3.8 3.8 4.2 4.9  CL 107 107 105 105 108 105  CO2 22 21* 24 23 22 23   GLUCOSE 108* 116* 133* 125* 124* 112*  BUN 42* 53* 82* 77* 67* 56*  CREATININE 1.43* 1.39* 1.41* 1.25* 1.27* 1.06*  CALCIUM 9.0 8.6* 8.8* 9.0 8.6* 8.6*  MG 1.8 1.9  --   --   --   --   PHOS 2.9 2.7  --   --   --   --    GFR: Estimated Creatinine Clearance: 25.6 mL/min (A) (by C-G formula based on SCr of 1.06 mg/dL (H)). Liver Function Tests: Recent Labs  Lab 01/11/19 0227 01/13/19 0452 01/14/19 0242 01/15/19 0230 01/16/19 0431  AST 37 26 25 24 26   ALT 20 24 25 22 24   ALKPHOS 70 82 81 71 73  BILITOT 0.8 0.6 1.0 1.2 1.3*  PROT 6.2* 6.3* 6.6 5.6* 5.7*  ALBUMIN 3.0* 3.0* 3.3* 3.2* 3.2*   No results for input(s): LIPASE, AMYLASE in the last 168 hours. No results for input(s): AMMONIA in the last 168 hours. Coagulation Profile: No results for input(s): INR, PROTIME in the last 168 hours. Cardiac Enzymes: No results for input(s): CKTOTAL, CKMB, CKMBINDEX, TROPONINI in the last 168 hours. BNP (last 3 results) No results for input(s): PROBNP in the last 8760 hours. HbA1C: No  results for input(s): HGBA1C in the last 72 hours. CBG: Recent Labs  Lab 01/12/19 0456 01/12/19 0757 01/12/19 1212 01/12/19 1650 01/12/19 2019  GLUCAP 156* 138* 165* 178* 123*   Lipid Profile: No results for input(s): CHOL, HDL, LDLCALC, TRIG, CHOLHDL, LDLDIRECT in the last 72 hours. Thyroid Function Tests: No results for input(s): TSH, T4TOTAL, FREET4, T3FREE, THYROIDAB in the last 72 hours. Anemia Panel: Recent Labs    01/15/19 0230 01/16/19 0431  FERRITIN 285 332*   Sepsis Labs: No results for input(s): PROCALCITON, LATICACIDVEN in the last 168 hours.  Recent Results (from the past 240 hour(s))  Blood Culture (routine x 2)     Status: None   Collection Time: 01/08/19  3:58 AM   Specimen: BLOOD  Result Value Ref Range Status   Specimen Description   Final    BLOOD BLOOD RIGHT FOREARM Performed at  [facility], [place] [address] [place], [place] [address]    Special Requests   Final    BOTTLES DRAWN AEROBIC AND ANAEROBIC Blood Culture adequate volume Performed at [place] [address], [place], [place] [address]    Culture   Final    NO GROWTH 5 DAYS Performed at [facility], [place] [address] [place], [place] [address]    Report Status 01/13/2019 FINAL  Final  Blood Culture (routine x 2)     Status: None   Collection Time: 01/08/19  9:01 AM   Specimen: BLOOD LEFT HAND  Result Value Ref Range Status   Specimen Description   Final    BLOOD LEFT HAND Performed at [facility] [address] [place], [place] [address]    Special Requests   Final    BOTTLES DRAWN AEROBIC AND ANAEROBIC Blood Culture adequate volume Performed at [place] [address], [place],  [address]    Culture   Final    NO GROWTH 5 DAYS Performed at [facility], [place] [address] [place],  [address]    Report Status 01/13/2019 FINAL  Final         Radiology Studies: No  results found.      Scheduled Meds: . amiodarone  100 mg Oral q morning - 10a  . vitamin C  500 mg Oral Daily  . atorvastatin  10 mg Oral QHS  . brimonidine  1 drop Both Eyes Q8H  . clopidogrel  75 mg Oral Daily  . heparin injection (subcutaneous)  5,000 Units Subcutaneous Q8H  . influenza vaccine adjuvanted  0.5 mL Intramuscular Tomorrow-1000  . Ipratropium-Albuterol  1 puff Inhalation TID  . latanoprost  1 drop Both Eyes QHS  . metoprolol tartrate  25 mg Oral BID  . multivitamin  1 tablet Oral Daily  . multivitamin with minerals  1 tablet Oral Daily  . pantoprazole  40 mg Oral Daily  . pneumococcal 23 valent vaccine  0.5 mL Intramuscular Tomorrow-1000  . sertraline  50 mg Oral Daily  . vitamin B-12  500 mcg Oral Daily  . zinc sulfate  220 mg Oral Daily   Continuous Infusions:   LOS: 8 days    Time spent: 30 minutes    Barb Merino, MD [facility] Pager [phone]

## 2019-01-17 MED ORDER — AMIODARONE HCL 100 MG PO TABS
100.0000 mg | ORAL_TABLET | Freq: Once | ORAL | Status: AC
  Administered 2019-01-17: 100 mg via ORAL
  Filled 2019-01-17: qty 1

## 2019-01-17 MED ORDER — AMIODARONE HCL 100 MG PO TABS: 200.0000 mg | ORAL_TABLET | Freq: Once | ORAL | Status: DC

## 2019-01-17 MED ORDER — MELATONIN 3 MG PO TABS
9.0000 mg | ORAL_TABLET | Freq: Every day | ORAL | Status: DC
  Administered 2019-01-17: 9 mg via ORAL
  Filled 2019-01-17 (×2): qty 3

## 2019-01-17 MED ORDER — METOPROLOL TARTRATE 25 MG PO TABS
12.5000 mg | ORAL_TABLET | Freq: Once | ORAL | Status: AC
  Administered 2019-01-17: 12.5 mg via ORAL
  Filled 2019-01-17: qty 1

## 2019-01-17 MED ORDER — METOPROLOL TARTRATE 25 MG PO TABS
37.5000 mg | ORAL_TABLET | Freq: Two times a day (BID) | ORAL | Status: DC
  Administered 2019-01-17: 37.5 mg via ORAL
  Filled 2019-01-17: qty 2

## 2019-01-17 NOTE — TOC Progression Note (Signed)
Transition of Care [facility]) - Progression Note    Patient Details  Name: Sheri Hensley MRN: [id number] Date of Birth: Mar 26, 1919  Transition of Care [facility]) CM/SW Contact  Loletha Grayer Beverely Pace, RN Phone Number: 01/17/2019, 10:19 AM  Clinical Narrative:   Case manager continues to try locating a bed for patient. [facility] in [place] does not accept [employer]. Re-called Amber with [facility] in [place] to see if they can offer a bed. Case manager has been in contact with patient's daughter , Jocelyn Lamer this morning concerning difficulty locating a bed for her mom.     Expected Discharge Plan: Skilled Nursing Facility Barriers to Discharge: SNF Pending bed offer  Expected Discharge Plan and Services Expected Discharge Plan: [place] Choice: [place] arrangements for the past 2 months: Apartment                                       Social Determinants of Health (SDOH) Interventions    Readmission Risk Interventions No flowsheet data found.

## 2019-01-17 NOTE — Progress Notes (Signed)
Spoke with Sheri Hensley about her mother's condition and answered all questions at this time.

## 2019-01-17 NOTE — Progress Notes (Signed)
PROGRESS NOTE    Sheri Hensley  [id number] DOB: [birth year] DOA: 01/08/2019 PCP: Josetta Huddle, MD    Brief Narrative:  85 year old female with history of PAF, breast cancer status post mastectomy on tamoxifen, endometrial cancer, CAD, glaucoma, HTN, HLD, narrow complex tachycardia, prior TIAs, prior C. difficile infection came into the hospital with fevers, cough, nausea and vomiting along with decreased appetite for the past week.  COVID-19 positive.  Chest x-ray on admission consistent with atypical pneumonia pattern. She was placed on remdesivir and Decadron and admitted to the hospital.  Patient living independent in an senior apartment with her daughter nearby. 01/11/2019: Rapid A. fib.  Delirium, increasing oxygen requirement.  Transferred to [facility]. Stabilizing now, waiting to go to SNF  Subjective:   No significant events overnight as discussed with staff, patient reports he is having good bowel movement she denies any complaints but appears to be anxious.  Assessment & Plan:   Active Problems:   Endometrial cancer ([place])   TIA (transient ischemic attack)   Essential hypertension   Paroxysmal atrial fibrillation (HCC)   Acute respiratory failure with hypoxia (HCC)   COVID-19 virus infection   Pneumonia due to COVID-19 virus  Pneumonia due to COVID-19 virus, acute hypoxemic respiratory failure: oxygen requirement improving today and mostly remains on room air(does require 1 to 2 L intermittently. Continue with chest physiotherapy, incentive spirometry, deep breathing exercises, sputum induction, mucolytic's and bronchodilators.  Not needing supplemental oxygen. Covid directed therapy with , Treated with Solu-Medrol >> Decadron, to finish total of 10 days  remdesivir, finished therapy. actemra, 1 dose given on 01/11/2019. antibiotics, not indicated. CTA negative for PE.  Paroxysmal A. fib, with RVR: Patient on amiodarone and metoprolol at home.   Overall her heart rate difficult to control during hospital stay. Continue with Imdur 100 mg oral daily, will give extra dose of 100 mg orally me later on today. Her metoprolol has been increased yesterday to 25 mg p.o. twice daily, given stable blood pressure I will go ahead and increase further today to 37.5. Not on anticoagulation.  Reviewing cardiology note she is not on anticoagulation because of gait instability and fall potential, has been followed closely by cardiology as an outpatient, defer further adjustment to her primary cardiologist as he is more familiar with her.  Coronary artery disease: Is stable now.  On Plavix, beta-blockers and statin.  Delirium: Patient developed delirium due to acute illnesses, worsened by medications.  Symptomatically improving.  Frequent orientation, regulate day and night cycles. Improved.  DVT prophylaxis: Heparin subcu Code Status: DNR Family Communication: Patient's daughter on the phone 12/28 Disposition Plan: SNF .     Consultants:   PCCM, signed off  Procedures:   None  Antimicrobials:  Anti-infectives (From admission, onward)   Start     Dose/Rate Route Frequency Ordered Stop   01/09/19 1000  remdesivir 100 mg in sodium chloride 0.9 % 100 mL IVPB     100 mg 200 mL/hr over 30 Minutes Intravenous Daily 01/08/19 0856 01/12/19 1032   01/08/19 1000  remdesivir 200 mg in sodium chloride 0.9% 250 mL IVPB     200 mg 580 mL/hr over 30 Minutes Intravenous Once 01/08/19 0855 01/08/19 1056          Objective: Vitals:   01/16/19 2049 01/16/19 2156 01/17/19 0438 01/17/19 0724  BP: (!) 131/98 (!) 128/93 135/90 (!) 125/102  Pulse: 87 (!) 107 98 98  Resp: 16  19 20   Temp: 98 F (  36.7 C)  98.1 F (36.7 C) 97.7 F (36.5 C)  TempSrc: Oral  Oral Oral  SpO2: 100%  100% 93%  Weight:      Height:       No intake or output data in the 24 hours ending 01/17/19 1116 Filed Weights   01/08/19 0248 01/08/19 0845  Weight: 56.7 kg 53.4 kg     Examination:  Awake Alert, Oriented X 3, No new F.N deficits, anxious Symmetrical Chest wall movement, Good air movement bilaterally, no wheezing Irregular irregular,No Gallops,Rubs or new Murmurs, No Parasternal Heave +ve B.Sounds, Abd Soft, No tenderness, No rebound - guarding or rigidity. No Cyanosis, Clubbing or edema, No new Rash or bruise       Data Reviewed: I have personally reviewed following labs and imaging studies  CBC: Recent Labs  Lab 01/11/19 0227 01/13/19 0452 01/14/19 0242 01/15/19 0230 01/16/19 0431  WBC 8.7 11.4* 11.6* 13.4* 13.0*  NEUTROABS 7.9* 10.9* 10.8* 12.4* 11.9*  HGB 11.6* 12.5 13.1 11.7* 12.1  HCT 36.0 38.9 41.3 36.5 37.7  MCV 89.1 90.9 91.8 90.8 90.6  PLT 141* 204 222 209 [id number]   Basic Metabolic Panel: Recent Labs  Lab 01/11/19 0227 01/13/19 0452 01/14/19 0242 01/15/19 0230 01/16/19 0431  NA 140 142 140 140 137  K 3.5 3.8 3.8 4.2 4.9  CL 107 105 105 108 105  CO2 21* 24 23 22 23   GLUCOSE 116* 133* 125* 124* 112*  BUN 53* 82* 77* 67* 56*  CREATININE 1.39* 1.41* 1.25* 1.27* 1.06*  CALCIUM 8.6* 8.8* 9.0 8.6* 8.6*  MG 1.9  --   --   --   --   PHOS 2.7  --   --   --   --    GFR: Estimated Creatinine Clearance: 25.6 mL/min (A) (by C-G formula based on SCr of 1.06 mg/dL (H)). Liver Function Tests: Recent Labs  Lab 01/11/19 0227 01/13/19 0452 01/14/19 0242 01/15/19 0230 01/16/19 0431  AST 37 26 25 24 26   ALT 20 24 25 22 24   ALKPHOS 70 82 81 71 73  BILITOT 0.8 0.6 1.0 1.2 1.3*  PROT 6.2* 6.3* 6.6 5.6* 5.7*  ALBUMIN 3.0* 3.0* 3.3* 3.2* 3.2*   No results for input(s): LIPASE, AMYLASE in the last 168 hours. No results for input(s): AMMONIA in the last 168 hours. Coagulation Profile: No results for input(s): INR, PROTIME in the last 168 hours. Cardiac Enzymes: No results for input(s): CKTOTAL, CKMB, CKMBINDEX, TROPONINI in the last 168 hours. BNP (last 3 results) No results for input(s): PROBNP in the last 8760  hours. HbA1C: No results for input(s): HGBA1C in the last 72 hours. CBG: Recent Labs  Lab 01/12/19 0456 01/12/19 0757 01/12/19 1212 01/12/19 1650 01/12/19 2019  GLUCAP 156* 138* 165* 178* 123*   Lipid Profile: No results for input(s): CHOL, HDL, LDLCALC, TRIG, CHOLHDL, LDLDIRECT in the last 72 hours. Thyroid Function Tests: No results for input(s): TSH, T4TOTAL, FREET4, T3FREE, THYROIDAB in the last 72 hours. Anemia Panel: Recent Labs    01/15/19 0230 01/16/19 0431  FERRITIN 285 332*   Sepsis Labs: No results for input(s): PROCALCITON, LATICACIDVEN in the last 168 hours.  Recent Results (from the past 240 hour(s))  Blood Culture (routine x 2)     Status: None   Collection Time: 01/08/19  3:58 AM   Specimen: BLOOD  Result Value Ref Range Status   Specimen Description   Final    BLOOD BLOOD RIGHT FOREARM Performed at [facility], [place] [address] [place], [place] [address]    Special Requests   Final    BOTTLES DRAWN AEROBIC AND ANAEROBIC Blood Culture adequate volume Performed at [facility] [address], [place], [place] [address]    Culture   Final    NO GROWTH 5 DAYS Performed at [facility], [place] [address] [place], [place] [address]    Report Status 01/13/2019 FINAL  Final  Blood Culture (routine x 2)     Status: None   Collection Time: 01/08/19  9:01 AM   Specimen: BLOOD LEFT HAND  Result Value Ref Range Status   Specimen Description   Final    BLOOD LEFT HAND Performed at [facility] [address], [place], [place] [address]    Special Requests   Final    BOTTLES DRAWN AEROBIC AND ANAEROBIC Blood Culture adequate volume Performed at [facility] [address] [place], [place] [address]    Culture   Final    NO GROWTH 5 DAYS Performed at [facility], [place] [address] [place], [place] [address]    Report Status 01/13/2019 FINAL  Final          Radiology Studies: No results found.      Scheduled Meds: . amiodarone  100 mg Oral q morning - 10a  . vitamin C  500 mg Oral Daily  . atorvastatin  10 mg Oral QHS  . brimonidine  1 drop Both Eyes Q8H  . clopidogrel  75 mg Oral Daily  . heparin injection (subcutaneous)  5,000 Units Subcutaneous Q8H  . influenza vaccine adjuvanted  0.5 mL Intramuscular Tomorrow-1000  . Ipratropium-Albuterol  1 puff Inhalation TID  . latanoprost  1 drop Both Eyes QHS  . metoprolol tartrate  25 mg Oral BID  . multivitamin  1 tablet Oral Daily  . multivitamin with minerals  1 tablet Oral Daily  . pantoprazole  40 mg Oral Daily  . pneumococcal 23 valent vaccine  0.5 mL Intramuscular Tomorrow-1000  . sertraline  50 mg Oral Daily  . vitamin B-12  500 mcg Oral Daily  . zinc sulfate  220 mg Oral Daily   Continuous Infusions:   LOS: 9 days      Phillips Climes, MD [facility]

## 2019-01-17 NOTE — Care Management Important Message (Signed)
Important Message  Patient Details  Name: Sheri Hensley MRN: [id number] Date of Birth: Mar 15, 1919   Medicare Important Message Given:  Yes - Important Message mailed due to current National Emergency  Verbal consent obtained due to current National Emergency  Relationship to patient: Child Contact Name: Tiffany Kocher Call Date: 01/17/19  Time: 1247 Phone: [phone] Outcome: Spoke with contact Important Message mailed to: Patient address on file    Delorse Lek 01/17/2019, 12:47 PM

## 2019-01-17 NOTE — Care Management (Signed)
Case manager re faxed referral to [facility] at the [facility] in [place]. Fax: [phone].   Ricki Miller, RN BSN Case Manager [phone]

## 2019-01-18 DIAGNOSIS — I480 Paroxysmal atrial fibrillation: Secondary | ICD-10-CM | POA: Diagnosis not present

## 2019-01-18 DIAGNOSIS — J80 Acute respiratory distress syndrome: Secondary | ICD-10-CM | POA: Diagnosis not present

## 2019-01-18 DIAGNOSIS — R531 Weakness: Secondary | ICD-10-CM | POA: Diagnosis not present

## 2019-01-18 DIAGNOSIS — E785 Hyperlipidemia, unspecified: Secondary | ICD-10-CM | POA: Diagnosis not present

## 2019-01-18 DIAGNOSIS — U071 COVID-19: Secondary | ICD-10-CM | POA: Diagnosis not present

## 2019-01-18 DIAGNOSIS — I2510 Atherosclerotic heart disease of native coronary artery without angina pectoris: Secondary | ICD-10-CM | POA: Diagnosis not present

## 2019-01-18 DIAGNOSIS — K219 Gastro-esophageal reflux disease without esophagitis: Secondary | ICD-10-CM | POA: Diagnosis not present

## 2019-01-18 DIAGNOSIS — Z7401 Bed confinement status: Secondary | ICD-10-CM | POA: Diagnosis not present

## 2019-01-18 DIAGNOSIS — M2550 Pain in unspecified joint: Secondary | ICD-10-CM | POA: Diagnosis not present

## 2019-01-18 DIAGNOSIS — R278 Other lack of coordination: Secondary | ICD-10-CM | POA: Diagnosis not present

## 2019-01-18 DIAGNOSIS — J1289 Other viral pneumonia: Secondary | ICD-10-CM | POA: Diagnosis not present

## 2019-01-18 DIAGNOSIS — J9601 Acute respiratory failure with hypoxia: Secondary | ICD-10-CM | POA: Diagnosis not present

## 2019-01-18 DIAGNOSIS — R2689 Other abnormalities of gait and mobility: Secondary | ICD-10-CM | POA: Diagnosis not present

## 2019-01-18 DIAGNOSIS — M6281 Muscle weakness (generalized): Secondary | ICD-10-CM | POA: Diagnosis not present

## 2019-01-18 DIAGNOSIS — I10 Essential (primary) hypertension: Secondary | ICD-10-CM | POA: Diagnosis not present

## 2019-01-18 MED ORDER — METOPROLOL TARTRATE 25 MG PO TABS
12.5000 mg | ORAL_TABLET | Freq: Two times a day (BID) | ORAL | Status: DC
  Administered 2019-01-18: 12.5 mg via ORAL
  Filled 2019-01-18: qty 1

## 2019-01-18 MED ORDER — AMIODARONE HCL 100 MG PO TABS: 100.0000 mg | ORAL_TABLET | Freq: Every morning | ORAL | Status: DC

## 2019-01-18 MED ORDER — AMIODARONE HCL 100 MG PO TABS
200.0000 mg | ORAL_TABLET | Freq: Once | ORAL | Status: DC
  Filled 2019-01-18: qty 2

## 2019-01-18 MED ORDER — ACETAMINOPHEN 325 MG PO TABS: 650.0000 mg | ORAL_TABLET | Freq: Four times a day (QID) | ORAL | Status: DC | PRN

## 2019-01-18 MED ORDER — GUAIFENESIN-DM 100-10 MG/5ML PO SYRP: 10.0000 mL | ORAL_SOLUTION | ORAL | 0 refills | Status: DC | PRN

## 2019-01-18 NOTE — Discharge Summary (Signed)
Sheri Hensley, is a 85 y.o. female  DOB Mar 11, 1919  MRN [id number].  Admission date:  01/08/2019  Admitting Physician  Sheri Elbe, DO  Discharge Date:  01/18/2019   Primary MD  Sheri Huddle, MD  Recommendations for primary care physician for things to follow:  -Please check CBC, CMP in 3 days. -Adjust her metoprolol dosing as needed, and given now she converted back to NSR. -Patient mainly on room air, may require 1 to 2 L intermittently.  CODE STATUS: DNR   Admission Diagnosis  Acute respiratory failure with hypoxia (HCC) [J96.01] Pneumonia due to COVID-19 virus [U07.1, J12.89]   Discharge Diagnosis  Acute respiratory failure with hypoxia ([place]) [J96.01] Pneumonia due to COVID-19 virus [U07.1, J12.89]    Active Problems:   Endometrial cancer ([place])   TIA (transient ischemic attack)   Essential hypertension   Paroxysmal atrial fibrillation (HCC)   Acute respiratory failure with hypoxia ([place])   COVID-19 virus infection   Pneumonia due to COVID-19 virus      Past Medical History:  Diagnosis Date  . Atrial fibrillation ([place])   . Bruises easily   . Cancer ([place]) 1998   left tamoxifem x 5 yrs  . Coronary artery disease    CARDIOLOGIST-  Sheri Sheri Hensley ([facility])  . Endometrial cancer ([place])   . Endometrial cancer ([place])   . Fall 06/2017   Patient fell and hit her head , patient's daughter's states patient may have fell on the kitchen counter  . GERD (gastroesophageal reflux disease)   . Glaucoma, both eyes   . History of breast cancer    1998--  s/p  left mastectomy with sln dissection's  ,  no chemo or radiation-  no recurrence  . History of Clostridium difficile infection 5-6 yrs ago  . History of TIAs 12/22/2015   yrs ago ? AND QUESTIONABLE ONE YRS AGO  . Hyperlipidemia   . Hypertension   . Macular degeneration   . Narrow complex tachycardia [facility])    cardiologist-  Sheri Hensley  . OA (osteoarthritis)    right knee  . PMB (postmenopausal bleeding)   . PONV (postoperative nausea and vomiting)    ponv after appendectomy 1954, DID WELL AFTER D AND C RECENT  . Thickened endometrium   . Wears glasses     Past Surgical History:  Procedure Laterality Date  . APPENDECTOMY  1954  . CATARACT EXTRACTION W/ INTRAOCULAR LENS  IMPLANT, BILATERAL  1994  . colonscopy    . DILATION AND CURETTAGE OF UTERUS N/A 11/20/2015   Procedure: DILATATION AND CURETTAGE;  Surgeon: Sheri Amber, MD;  Location: [facility];  Service: Gynecology;  Laterality: N/A;  . DILATION AND CURETTAGE, DIAGNOSTIC / THERAPEUTIC N/A novmber 2017   per daughter with biopsy as stated  . KNEE ARTHROSCOPY Right 2002  . MASTECTOMY Left 1998   w/ lympn node dissection's  . ROBOTIC ASSISTED TOTAL HYSTERECTOMY WITH BILATERAL SALPINGO OOPHERECTOMY Bilateral 01/27/2016   Procedure: XI ROBOTIC ASSISTED TOTAL HYSTERECTOMY WITH BILATERAL  SALPINGO OOPHORECTOMY;  Surgeon: Sheri Amber, MD;  Location: [facility];  Service: Gynecology;  Laterality: Bilateral;       History of present illness and  Hospital Course:     Kindly see H&P for history of present illness and admission details, please review complete Labs, Consult reports and Test reports for all details in brief  HPI  from the history and physical done on the day of admission 01/08/2019   HPI: Sheri Hensley is a 85 y.o. female with PMH significant for but not limited too paroxysmal atrial fibrillation history of left-sided breast cancer status postmastectomy and tamoxifen for 5 years along with endometrial cancer, coronary artery disease seen by Sheri Hensley in [place], GERD, glaucoma of both eyes and macular degeneration, hypertension, hyperlipidemia, osteoarthritis, and history of narrow complex tachycardia as well as history of TIAs and C. difficile infection along with other comorbidities who presents with a chief  complaint of fevers, cough as well as nausea and vomiting along with decreased appetite since January 03, 2019.  Patient states that she had some friends visit from [place] and out of town and did not realize that been exposed.  States that she developed a cough about 4 days ago and states it is a "hacking cough" but then had a productive sputum a few days ago  She also had some inner mitten fevers hought she was initially getting better but then started having some nausea and vomiting and had some diarrhea last night.  Patient currently lives alone but lives near her daughter and multiple family members have tested positive including her daughter but she had not been tested.  At home she has saturation of 88% and does not wear oxygen and She contacted her primary care physician who recommended the patient come to the hospital for further evaluation so EMS was called.  TRH was asked to admit this patient for acute hypoxic respiratory failure in the setting of COVID-19 disease and pneumonia  ED Course: In the emergency room she had basic blood work done including a CBC and a CMP along with an EKG and a chest x-ray which showed an atypical pattern of pneumonia.  Inflammatory markers were checked which were elevated.  She was given acetaminophen and ondansetron and blood cultures were obtained  Hospital Course   Pneumonia due to COVID-19 virus, acute hypoxemic respiratory failure: oxygen requirement improving today and mostly remains on room air(does require 1 to 2 L intermittently. Continue with chest physiotherapy, incentive spirometry, deep breathing exercises, sputum induction,  Finished 10 days of steroids Finished 5 days of IV remdesivir actemra, 1 dose given on 01/11/2019. antibiotics, not indicated. CTA negative for PE.  Paroxysmal A. fib, with RVR: -Patient had A. fib with RVR during hospital stay, which she did require her metoprolol dose to be adjusted, and she did require couple of  extra doses of amiodarone during her hospital stay. -Converted back to normal sinus rhythm this morning, heart rate in the 50s to 60s, so she will be discharged on her home regimen including amiodarone 100 mg oral daily and metoprolol 12.5 mg p.o. twice daily. -Not on anticoagulation.  Reviewing cardiology note she is not on anticoagulation because of gait instability and fall potential, has been followed closely by cardiology as an outpatient, defer further adjustment to her primary cardiologist as he is more familiar with her.  Coronary artery disease: Is stable now.  On Plavix, beta-blockers and statin.  Toprol has been stopped given soft blood pressure  Delirium:  Resolved   Discharge Condition:   stable    Discharge Instructions  and  Discharge Medications     Discharge Instructions    Discharge instructions   Complete by: As directed    Follow with Primary MD Sheri Huddle, MD /SNF physician  Get CBC, CMP,checked  by Primary MD next visit.    Activity: As tolerated with Full fall precautions use walker/cane & assistance as needed   Disposition SNF   Diet: Heart Healthy  , with feeding assistance and aspiration precautions.  For Heart failure patients - Check your Weight same time everyday, if you gain over 2 pounds, or you develop in leg swelling, experience more shortness of breath or chest pain, call your Primary MD immediately. Follow Cardiac Low Salt Diet and 1.5 lit/day fluid restriction.   On your next visit with your primary care physician please Get Medicines reviewed and adjusted.   Please request your Prim.MD to go over all Hospital Tests and Procedure/Radiological results at the follow up, please get all Hospital records sent to your Prim MD by signing hospital release before you go home.   If you experience worsening of your admission symptoms, develop shortness of breath, life threatening emergency, suicidal or homicidal thoughts you must seek medical  attention immediately by calling 911 or calling your MD immediately  if symptoms less severe.  You Must read complete instructions/literature along with all the possible adverse reactions/side effects for all the Medicines you take and that have been prescribed to you. Take any new Medicines after you have completely understood and accpet all the possible adverse reactions/side effects.   Do not drive, operating heavy machinery, perform activities at heights, swimming or participation in water activities or provide baby sitting services if your were admitted for syncope or siezures until you have seen by Primary MD or a Neurologist and advised to do so again.  Do not drive when taking Pain medications.    Do not take more than prescribed Pain, Sleep and Anxiety Medications  Special Instructions: If you have smoked or chewed Tobacco  in the last 2 yrs please stop smoking, stop any regular Alcohol  and or any Recreational drug use.  Wear Seat belts while driving.   Please note  You were cared for by a hospitalist during your hospital stay. If you have any questions about your discharge medications or the care you received while you were in the hospital after you are discharged, you can call the unit and asked to speak with the hospitalist on call if the hospitalist that took care of you is not available. Once you are discharged, your primary care physician will handle any further medical issues. Please note that NO REFILLS for any discharge medications will be authorized once you are discharged, as it is imperative that you return to your primary care physician (or establish a relationship with a primary care physician if you do not have one) for your aftercare needs so that they can reassess your need for medications and monitor your lab values.   Increase activity slowly   Complete by: As directed      Allergies as of 01/18/2019      Reactions   Ativan [lorazepam]    "HALLUCINATIONS"    Flagyl [metronidazole] Rash      Medication List    STOP taking these medications   ibuprofen 600 MG tablet Commonly known as: ADVIL   lisinopril 10 MG tablet Commonly known as: ZESTRIL     TAKE  these medications   acetaminophen 325 MG tablet Commonly known as: TYLENOL Take 2 tablets (650 mg total) by mouth every 6 (six) hours as needed for mild pain or headache (fever >/= 101). What changed:   medication strength  how much to take  reasons to take this   Alphagan P 0.1 % Soln Generic drug: brimonidine Place 1 drop into both eyes every 8 (eight) hours.   amiodarone 200 MG tablet Commonly known as: PACERONE Take 0.5 tablets (100 mg total) by mouth every morning.   atorvastatin 10 MG tablet Commonly known as: LIPITOR Take 1 tablet (10 mg total) by mouth at bedtime.   CENTRUM VITAMINTS PO Take 1 tablet by mouth every morning.   ICaps Areds 2 Caps Take 1 tablet by mouth 2 (two) times daily.   clopidogrel 75 MG tablet Commonly known as: PLAVIX Take 1 tablet (75 mg total) by mouth daily.   guaiFENesin-dextromethorphan 100-10 MG/5ML syrup Commonly known as: ROBITUSSIN DM Take 10 mLs by mouth every 4 (four) hours as needed for cough.   loratadine 10 MG tablet Commonly known as: CLARITIN Take 10 mg by mouth at bedtime.   Lumigan 0.01 % Soln Generic drug: bimatoprost Place 1 drop into both eyes at bedtime.   Melatonin 10 MG Caps Take 10 mg by mouth at bedtime.   metoprolol tartrate 25 MG tablet Commonly known as: LOPRESSOR Take 12.5 mg by mouth 2 (two) times daily.   sertraline 50 MG tablet Commonly known as: ZOLOFT Take 50 mg by mouth daily.   V-R VITAMIN B-12 500 MCG tablet Generic drug: vitamin B-12 Take 500 mcg by mouth daily.         Diet and Activity recommendation: See Discharge Instructions above   Consults obtained -  None   Major procedures and Radiology Reports - PLEASE review detailed and final reports for all details, in brief -      CT ANGIO CHEST PE W OR WO CONTRAST  Result Date: 01/11/2019 CLINICAL DATA:  COVID pneumonia. Shortness of breath. EXAM: CT ANGIOGRAPHY CHEST WITH CONTRAST TECHNIQUE: Multidetector CT imaging of the chest was performed using the standard protocol during bolus administration of intravenous contrast. Multiplanar CT image reconstructions and MIPs were obtained to evaluate the vascular anatomy. CONTRAST:  54mL OMNIPAQUE IOHEXOL 350 MG/ML SOLN COMPARISON:  CT chest 05/31/2016 FINDINGS: Cardiovascular: Heart size mildly enlarged. No substantial pericardial effusion. Coronary artery calcification is evident. Atherosclerotic calcification is noted in the wall of the thoracic aorta. No filling defect in the opacified pulmonary arteries to suggest the presence of an acute pulmonary embolus. Mediastinum/Nodes: No mediastinal lymphadenopathy. 1.4 cm right thyroid nodule similar to prior and likely not clinically relevant in this individual. There is no hilar lymphadenopathy. The esophagus has normal imaging features. There is no axillary lymphadenopathy. Lungs/Pleura: Patchy ground-glass airspace opacity noted in the lungs bilaterally. No associated pleural effusion. Upper Abdomen: Unremarkable. Musculoskeletal: No worrisome lytic or sclerotic osseous abnormality.Superior endplate compression deformity at T2, T4, T5, and T6 is new since prior study. Review of the MIP images confirms the above findings. IMPRESSION: 1. No CT evidence for acute pulmonary embolus. 2. Patchy ground-glass airspace opacity in the lungs bilaterally. Imaging features compatible with reported clinical history of COVID-19 pneumonia. 3. Aortic Atherosclerosis (ICD10-I70.0). Electronically Signed   By: Misty Stanley M.D.   On: 01/11/2019 16:09   DG CHEST PORT 1 VIEW  Result Date: 01/10/2019 CLINICAL DATA:  COVID-19 infection. Shortness of breath with altered mental status. History of breast and  endometrial cancer. EXAM: PORTABLE CHEST 1 VIEW  COMPARISON:  Radiographs 02/10/2018, 01/08/2019 and 01/09/2019. CT 05/31/2016. FINDINGS: 1826 hours. The heart size and mediastinal contours are stable with aortic atherosclerosis. There are worsening bilateral ground-glass and airspace opacities bilaterally. No significant pleural effusion or pneumothorax identified. Postsurgical changes are present in the left breast and left axilla. IMPRESSION: Significant worsening of bilateral airspace opacities consistent with progressive viral pneumonia. Electronically Signed   By: Richardean Sale M.D.   On: 01/10/2019 18:56   DG CHEST PORT 1 VIEW  Result Date: 01/09/2019 CLINICAL DATA:  Shortness of breath. EXAM: PORTABLE CHEST 1 VIEW COMPARISON:  01/08/2019 FINDINGS: The cardiac silhouette, mediastinal and hilar contours are stable. Stable tortuosity and calcification of the thoracic aorta. Persistent patchy bilateral infiltrates. No effusions or pneumothorax. IMPRESSION: Persistent patchy bilateral infiltrates. Electronically Signed   By: Marijo Sanes M.D.   On: 01/09/2019 06:24   DG Chest Port 1 View  Result Date: 01/08/2019 CLINICAL DATA:  Probable COVID.  Fever and hypoxia EXAM: PORTABLE CHEST 1 VIEW COMPARISON:  12/15/2015 FINDINGS: Patchy bilateral pulmonary infiltrate. Normal heart size and stable aortic tortuosity. No edema, effusion, or pneumothorax when accounting for skin folds. Postoperative left breast and axilla. IMPRESSION: Atypical pneumonia pattern. Electronically Signed   By: Monte Fantasia M.D.   On: 01/08/2019 04:20    Micro Results    No results found for this or any previous visit (from the past 240 hour(s)).     Today   Subjective:   Mills Koller today has no headache,no chest  pain, dyspnea has improved, constipation has resolved Objective:   Blood pressure (!) 94/53, pulse (!) 51, temperature 97.7 F (36.5 C), temperature source Oral, resp. rate 14, height 5\' 5"  (1.651 m), weight 53.4 kg, SpO2 95  %.   Intake/Output Summary (Last 24 hours) at 01/18/2019 1141 Last data filed at 01/18/2019 0400 Gross per 24 hour  Intake 120 ml  Output 1050 ml  Net -930 ml    Exam Awake Alert, Oriented x 3, No new F.N deficits, frail, anxious Symmetrical Chest wall movement, Good air movement bilaterally, CTAB RRR,No Gallops,Rubs or new Murmurs, No Parasternal Heave +ve B.Sounds, Abd Soft, Non tender,  No rebound -guarding or rigidity. No Cyanosis, Clubbing or edema, No new Rash or bruise  Data Review   CBC w Diff:  Lab Results  Component Value Date   WBC 13.0 (H) 01/16/2019   HGB 12.1 01/16/2019   HGB 11.9 08/10/2016   HCT 37.7 01/16/2019   HCT 37.1 08/10/2016   PLT 200 01/16/2019   PLT 134 (L) 08/10/2016   LYMPHOPCT 3 01/16/2019   MONOPCT 3 01/16/2019   EOSPCT 0 01/16/2019   BASOPCT 0 01/16/2019    CMP:  Lab Results  Component Value Date   NA 137 01/16/2019   NA 143 10/09/2018   NA 141 10/08/2016   K 4.9 01/16/2019   K 4.2 10/08/2016   CL 105 01/16/2019   CO2 23 01/16/2019   CO2 27 10/08/2016   BUN 56 (H) 01/16/2019   BUN 20 10/09/2018   BUN 17.8 10/08/2016   CREATININE 1.06 (H) 01/16/2019   CREATININE 1.0 10/08/2016   PROT 5.7 (L) 01/16/2019   PROT 6.8 10/09/2018   PROT 7.1 10/08/2016   ALBUMIN 3.2 (L) 01/16/2019   ALBUMIN 4.6 10/09/2018   ALBUMIN 4.1 10/08/2016   BILITOT 1.3 (H) 01/16/2019   BILITOT 0.8 10/09/2018   BILITOT 0.96 10/08/2016   ALKPHOS 73 01/16/2019   ALKPHOS 97  10/08/2016   AST 26 01/16/2019   AST 26 10/08/2016   ALT 24 01/16/2019   ALT 20 10/08/2016  .   Total Time in preparing paper work, data evaluation and todays exam - 101 minutes  Phillips Climes M.D on 01/18/2019 at 11:41 AM  [facility]   Office  [phone]

## 2019-01-18 NOTE — TOC Progression Note (Signed)
Transition of Care [facility]) - Progression Note    Patient Details  Name: KAHO KUSSMAN MRN: [id number] Date of Birth: [birth year]  Transition [facility]) CM/SW Contact  Loletha Grayer Beverely Pace, RN Phone Number: 01/18/2019, 9:24 AM  Clinical Narrative:   [facility] in [place] has accepted patient. Patient's daughter was notified. Case Horticulturist, commercial to confirm and will notify MD.     Expected Discharge Plan: Skilled Nursing Facility Barriers to Discharge: SNF Pending bed offer  Expected Discharge Plan and Services Expected Discharge Plan: [place] Choice: [place] arrangements for the past 2 months: Apartment                                       Social Determinants of Health (SDOH) Interventions    Readmission Risk Interventions No flowsheet data found.

## 2019-01-18 NOTE — TOC Transition Note (Signed)
Transition of Care [facility]) - CM/SW Discharge Note   Patient Details  Name: Sheri Hensley MRN: [id number] Date of Birth: 02-05-1919  [facility]) CM/SW Contact:  Ninfa Meeker, RN Phone Number: [phone] (working remotely) 01/18/2019, 10:34 AM   Clinical Narrative:  Patient will DC to: [facility] in [place] date: 01/18/19 Family notified: Daughter: Jocelyn Lamer [facility] by: Corey Harold  1:30pm  Patient is medically ready for discharge to the [facility] in [place]. Case manager has confirmed bed availability with Amber at the facility. Charge nurse, bedside RN are aware of discharge plan.  Discharge Summary, and FL2 will be faxed to the facility-[phone]. DNR needs to be on the chart. Case manager has printed Medical necessity form and demographics to the nurses unit. Bedside RN to call report to: [phone], patient will go to [facility]. Ambulance transport will be requested.        Final next level of care: Skilled Nursing Facility Barriers to Discharge: No Barriers Identified   Patient Goals and CMS Choice Patient states their goals for this hospitalization and ongoing recovery are:: per daughter: wants mom to get better CMS Medicare.gov Compare Post Acute Care list provided to:: Patient Represenative (must comment)(daughter : Mickel Fuchs) Choice offered to / list presented to : Adult Children  Discharge Placement                       Discharge Plan and Services     Post Acute Care Choice: Skilled Nursing Facility                               Social Determinants of Health (SDOH) Interventions     Readmission Risk Interventions No flowsheet data found.

## 2019-01-18 NOTE — Discharge Instructions (Signed)
Person Under Monitoring Name: Sheri Hensley  Location: [place] [address]   Infection Prevention Recommendations for Individuals Confirmed to have, or Being Evaluated for, 2019 Novel Coronavirus (COVID-19) Infection Who Receive Care at Home  Individuals who are confirmed to have, or are being evaluated for, COVID-19 should follow the prevention steps below until a healthcare provider or local or state health department says they can return to normal activities.  Stay home except to get medical care You should restrict activities outside your home, except for getting medical care. Do not go to work, school, or public areas, and do not use public transportation or taxis.  Call ahead before visiting your doctor Before your medical appointment, call the healthcare provider and tell them that you have, or are being evaluated for, COVID-19 infection. This will help the healthcare providers office take steps to keep other people from getting infected. Ask your healthcare provider to call the local or state health department.  Monitor your symptoms Seek prompt medical attention if your illness is worsening (e.g., difficulty breathing). Before going to your medical appointment, call the healthcare provider and tell them that you have, or are being evaluated for, COVID-19 infection. Ask your healthcare provider to call the local or state health department.  Wear a facemask You should wear a facemask that covers your nose and mouth when you are in the same room with other people and when you visit a healthcare provider. People who live with or visit you should also wear a facemask while they are in the same room with you.  Separate yourself from other people in your home As much as possible, you should stay in a different room from other people in your home. Also, you should use a separate bathroom, if available.  Avoid sharing household items You should not  share dishes, drinking glasses, cups, eating utensils, towels, bedding, or other items with other people in your home. After using these items, you should wash them thoroughly with soap and water.  Cover your coughs and sneezes Cover your mouth and nose with a tissue when you cough or sneeze, or you can cough or sneeze into your sleeve. Throw used tissues in a lined trash can, and immediately wash your hands with soap and water for at least 20 seconds or use an alcohol-based hand rub.  Wash your [facility] your hands often and thoroughly with soap and water for at least 20 seconds. You can use an alcohol-based hand sanitizer if soap and water are not available and if your hands are not visibly dirty. Avoid touching your eyes, nose, and mouth with unwashed hands.   Prevention Steps for Caregivers and Household Members of Individuals Confirmed to have, or Being Evaluated for, COVID-19 Infection Being Cared for in the Home  If you live with, or provide care at home for, a person confirmed to have, or being evaluated for, COVID-19 infection please follow these guidelines to prevent infection:  Follow healthcare providers instructions Make sure that you understand and can help the patient follow any healthcare provider instructions for all care.  Provide for the patients basic needs You should help the patient with basic needs in the home and provide support for getting groceries, prescriptions, and other personal needs.  Monitor the patients symptoms If they are getting sicker, call his or her medical provider and tell them that the patient has, or is being evaluated for, COVID-19 infection. This will help the healthcare providers  office take steps to keep other people from getting infected. Ask the healthcare provider to call the local or state health department.  Limit the number of people who have contact with the patient  If possible, have only one caregiver for the  patient.  Other household members should stay in another home or place of residence. If this is not possible, they should stay  in another room, or be separated from the patient as much as possible. Use a separate bathroom, if available.  Restrict visitors who do not have an essential need to be in the home.  Keep older adults, very young children, and other sick people away from the patient Keep older adults, very young children, and those who have compromised immune systems or chronic health conditions away from the patient. This includes people with chronic heart, lung, or kidney conditions, diabetes, and cancer.  Ensure good ventilation Make sure that shared spaces in the home have good air flow, such as from an air conditioner or an opened window, weather permitting.  Wash your hands often  Wash your hands often and thoroughly with soap and water for at least 20 seconds. You can use an alcohol based hand sanitizer if soap and water are not available and if your hands are not visibly dirty.  Avoid touching your eyes, nose, and mouth with unwashed hands.  Use disposable paper towels to dry your hands. If not available, use dedicated cloth towels and replace them when they become wet.  Wear a facemask and gloves  Wear a disposable facemask at all times in the room and gloves when you touch or have contact with the patients blood, body fluids, and/or secretions or excretions, such as sweat, saliva, sputum, nasal mucus, vomit, urine, or feces.  Ensure the mask fits over your nose and mouth tightly, and do not touch it during use.  Throw out disposable facemasks and gloves after using them. Do not reuse.  Wash your hands immediately after removing your facemask and gloves.  If your personal clothing becomes contaminated, carefully remove clothing and launder. Wash your hands after handling contaminated clothing.  Place all used disposable facemasks, gloves, and other waste in a lined  container before disposing them with other household waste.  Remove gloves and wash your hands immediately after handling these items.  Do not share dishes, glasses, or other household items with the patient  Avoid sharing household items. You should not share dishes, drinking glasses, cups, eating utensils, towels, bedding, or other items with a patient who is confirmed to have, or being evaluated for, COVID-19 infection.  After the person uses these items, you should wash them thoroughly with soap and water.  Wash laundry thoroughly  Immediately remove and wash clothes or bedding that have blood, body fluids, and/or secretions or excretions, such as sweat, saliva, sputum, nasal mucus, vomit, urine, or feces, on them.  Wear gloves when handling laundry from the patient.  Read and follow directions on labels of laundry or clothing items and detergent. In general, wash and dry with the warmest temperatures recommended on the label.  Clean all areas the individual has used often  Clean all touchable surfaces, such as counters, tabletops, doorknobs, bathroom fixtures, toilets, phones, keyboards, tablets, and bedside tables, every day. Also, clean any surfaces that may have blood, body fluids, and/or secretions or excretions on them.  Wear gloves when cleaning surfaces the patient has come in contact with.  Use a diluted bleach solution (e.g., dilute bleach with 1  part bleach and 10 parts water) or a household disinfectant with a label that says EPA-registered for coronaviruses. To make a bleach solution at home, add 1 tablespoon of bleach to 1 quart (4 cups) of water. For a larger supply, add  cup of bleach to 1 gallon (16 cups) of water.  Read labels of cleaning products and follow recommendations provided on product labels. Labels contain instructions for safe and effective use of the cleaning product including precautions you should take when applying the product, such as wearing gloves or  eye protection and making sure you have good ventilation during use of the product.  Remove gloves and wash hands immediately after cleaning.  Monitor yourself for signs and symptoms of illness Caregivers and household members are considered close contacts, should monitor their health, and will be asked to limit movement outside of the home to the extent possible. Follow the monitoring steps for close contacts listed on the symptom monitoring form.   ? If you have additional questions, contact your local health department or call the epidemiologist on call at [phone] (available 24/7). ? This guidance is subject to change. For the most up-to-date guidance from [facility], please refer to their website: [email or web address]

## 2019-01-18 NOTE — Progress Notes (Signed)
Report given to Duard Brady, RN at the [place].  [facility] set to transport patient at 1:30pm.

## 2019-01-29 DIAGNOSIS — E559 Vitamin D deficiency, unspecified: Secondary | ICD-10-CM | POA: Diagnosis not present

## 2019-01-29 DIAGNOSIS — I10 Essential (primary) hypertension: Secondary | ICD-10-CM | POA: Diagnosis not present

## 2019-01-29 DIAGNOSIS — D649 Anemia, unspecified: Secondary | ICD-10-CM | POA: Diagnosis not present

## 2019-01-29 DIAGNOSIS — E785 Hyperlipidemia, unspecified: Secondary | ICD-10-CM | POA: Diagnosis not present

## 2019-02-05 DIAGNOSIS — Z20822 Contact with and (suspected) exposure to covid-19: Secondary | ICD-10-CM | POA: Diagnosis not present

## 2019-02-26 ENCOUNTER — Ambulatory Visit: Payer: Medicare HMO | Attending: Internal Medicine

## 2019-02-26 DIAGNOSIS — Z23 Encounter for immunization: Secondary | ICD-10-CM | POA: Insufficient documentation

## 2019-02-26 NOTE — Progress Notes (Signed)
   Covid-19 Vaccination Clinic  Name:  Sheri Hensley    MRN: [id number] DOB: 1919/03/22  02/26/2019  [name] was observed post Covid-19 immunization for 15 minutes without incidence. She was provided with Vaccine Information Sheet and instruction to access the V-Safe system.   [name] was instructed to call 911 with any severe reactions post vaccine: Marland Kitchen Difficulty breathing  . Swelling of your face and throat  . A fast heartbeat  . A bad rash all over your body  . Dizziness and weakness    Immunizations Administered    Name Date Dose VIS Date Route   Pfizer COVID-19 Vaccine 02/26/2019  4:35 PM 0.3 mL 12/29/2018 Intramuscular   Manufacturer: [place]   Lot: [id number]   [place]: [id number]    .

## 2019-03-23 ENCOUNTER — Ambulatory Visit: Payer: Medicare HMO | Attending: Internal Medicine

## 2019-03-23 DIAGNOSIS — Z23 Encounter for immunization: Secondary | ICD-10-CM | POA: Insufficient documentation

## 2019-03-23 NOTE — Progress Notes (Signed)
   Covid-19 Vaccination Clinic  Name:  DONNE WADE    MRN: [id number] DOB: [birth year]  03/23/2019  Ms. Procell was observed post Covid-19 immunization for 15 minutes without incident. She was provided with Vaccine Information Sheet and instruction to access the V-Safe system.   Ms. Vanbrunt was instructed to call 911 with any severe reactions post vaccine: Marland Kitchen Difficulty breathing  . Swelling of face and throat  . A fast heartbeat  . A bad rash all over body  . Dizziness and weakness   Immunizations Administered    Name Date Dose VIS Date Route   Pfizer COVID-19 Vaccine 03/23/2019  4:45 PM 0.3 mL 12/29/2018 Intramuscular   Manufacturer: [place]   Lot: [id number]   [place]: [id number]

## 2019-04-23 ENCOUNTER — Other Ambulatory Visit: Payer: Self-pay

## 2019-04-23 ENCOUNTER — Ambulatory Visit: Payer: Medicare HMO | Admitting: Cardiology

## 2019-04-23 ENCOUNTER — Encounter: Payer: Self-pay | Admitting: Cardiology

## 2019-04-23 ENCOUNTER — Ambulatory Visit (HOSPITAL_BASED_OUTPATIENT_CLINIC_OR_DEPARTMENT_OTHER)
Admission: RE | Admit: 2019-04-23 | Discharge: 2019-04-23 | Disposition: A | Discharge status: Home / Self Care | Payer: Medicare HMO | Source: Ambulatory Visit | Attending: Cardiology | Admitting: Cardiology

## 2019-04-23 VITALS — BP 160/76 | HR 56 | Ht 65.0 in | Wt 125.0 lb

## 2019-04-23 DIAGNOSIS — Z79899 Other long term (current) drug therapy: Secondary | ICD-10-CM

## 2019-04-23 DIAGNOSIS — I10 Essential (primary) hypertension: Secondary | ICD-10-CM

## 2019-04-23 DIAGNOSIS — I480 Paroxysmal atrial fibrillation: Secondary | ICD-10-CM

## 2019-04-23 NOTE — Progress Notes (Signed)
Cardiology Office Note:    Date:  04/23/2019   ID:  [name], DOB Nov 18, 1919, MRN [id number]  PCP:  Josetta Huddle, MD  Cardiologist:  Jenean Lindau, MD   Referring MD: Josetta Huddle, MD    ASSESSMENT:    1. Essential hypertension   2. Paroxysmal atrial fibrillation (HCC)    PLAN:    In order of problems listed above:  1. Primary prevention stressed with the patient.  Importance of compliance with diet and medication stressed and she vocalized understanding. 2. Essential hypertension: Blood pressure is stable.  Daughter mentions to me that she has an element of whitecoat hypertension and will keep track of her blood pressures.  She will let me know if they are elevated. 3. Paroxysmal atrial fibrillation:I discussed with the patient atrial fibrillation, disease process. Management and therapy including rate and rhythm control, anticoagulation benefits and potential risks were discussed extensively with the patient. Patient had multiple questions which were answered to patient's satisfaction. 4. Last CT scan reveals patchy infiltrates in the lungs so we will get a chest x-ray today since she is on amiodarone therapy. 5. Patient will be seen in follow-up appointment in 6 months or earlier if the patient has any concerns    Medication Adjustments/Labs and Tests Ordered: Current medicines are reviewed at length with the patient today.  Concerns regarding medicines are outlined above.  No orders of the defined types were placed in this encounter.  No orders of the defined types were placed in this encounter.    No chief complaint on file.    History of Present Illness:    Sheri Hensley is a 86 y.o. female.  Patient has past medical history of atrial fibrillation, essential hypertension.  She has paroxysmal atrial fibrillation.  She is not on anticoagulation because of unstable gait.  She had COVID-19 pneumonia and was hospitalized few months ago and discharged.   Subsequently she has done fine.  No chest pain orthopnea or PND.  At the time of my evaluation, the patient is alert awake oriented and in no distress.  Past Medical History:  Diagnosis Date  . Atrial fibrillation ([place])   . Bruises easily   . Cancer ([place]) 1998   left tamoxifem x 5 yrs  . Coronary artery disease    CARDIOLOGIST-  DR Geraldo Pitter ([facility])  . Endometrial cancer ([place])   . Endometrial cancer ([place])   . Fall 06/2017   Patient fell and hit her head , patient's daughter's states patient may have fell on the kitchen counter  . GERD (gastroesophageal reflux disease)   . Glaucoma, both eyes   . History of breast cancer    1998--  s/p  left mastectomy with sln dissection's  ,  no chemo or radiation-  no recurrence  . History of Clostridium difficile infection 5-6 yrs ago  . History of TIAs 12/22/2015   yrs ago ? AND QUESTIONABLE ONE YRS AGO  . Hyperlipidemia   . Hypertension   . Macular degeneration   . Narrow complex tachycardia [facility])    cardiologist-  dr Salley Scarlet  . OA (osteoarthritis)    right knee  . PMB (postmenopausal bleeding)   . PONV (postoperative nausea and vomiting)    ponv after appendectomy 1954, DID WELL AFTER D AND C RECENT  . Thickened endometrium   . Wears glasses     Past Surgical History:  Procedure Laterality Date  . APPENDECTOMY  1954  . CATARACT EXTRACTION W/ INTRAOCULAR  LENS  IMPLANT, BILATERAL  1994  . colonscopy    . DILATION AND CURETTAGE OF UTERUS N/A 11/20/2015   Procedure: DILATATION AND CURETTAGE;  Surgeon: Everitt Amber, MD;  Location: [facility];  Service: Gynecology;  Laterality: N/A;  . DILATION AND CURETTAGE, DIAGNOSTIC / THERAPEUTIC N/A novmber 2017   per daughter with biopsy as stated  . KNEE ARTHROSCOPY Right 2002  . MASTECTOMY Left 1998   w/ lympn node dissection's  . ROBOTIC ASSISTED TOTAL HYSTERECTOMY WITH BILATERAL SALPINGO OOPHERECTOMY Bilateral 01/27/2016   Procedure: XI ROBOTIC ASSISTED TOTAL  HYSTERECTOMY WITH BILATERAL SALPINGO OOPHORECTOMY;  Surgeon: Everitt Amber, MD;  Location: [facility];  Service: Gynecology;  Laterality: Bilateral;    Current Medications: Current Meds  Medication Sig  . acetaminophen (TYLENOL) 325 MG tablet Take 2 tablets (650 mg total) by mouth every 6 (six) hours as needed for mild pain or headache (fever >/= 101).  Marland Kitchen amiodarone (PACERONE) 200 MG tablet Take 0.5 tablets (100 mg total) by mouth every morning.  Marland Kitchen atorvastatin (LIPITOR) 10 MG tablet Take 1 tablet (10 mg total) by mouth at bedtime.  . bimatoprost (LUMIGAN) 0.01 % SOLN Place 1 drop into both eyes at bedtime.  . brimonidine (ALPHAGAN P) 0.1 % SOLN Place 1 drop into both eyes every 8 (eight) hours.   . clopidogrel (PLAVIX) 75 MG tablet Take 1 tablet (75 mg total) by mouth daily.  . cyanocobalamin (V-R VITAMIN B-12) 500 MCG tablet Take 500 mcg by mouth daily.   Marland Kitchen guaiFENesin-dextromethorphan (ROBITUSSIN DM) 100-10 MG/5ML syrup Take 10 mLs by mouth every 4 (four) hours as needed for cough.  . loratadine (CLARITIN) 10 MG tablet Take 10 mg by mouth at bedtime.   . Melatonin 10 MG CAPS Take 10 mg by mouth at bedtime.   . metoprolol tartrate (LOPRESSOR) 25 MG tablet Take 12.5 mg by mouth 2 (two) times daily.   . Multiple Vitamins-Minerals (CENTRUM VITAMINTS PO) Take 1 tablet by mouth every morning.  . Multiple Vitamins-Minerals (ICAPS AREDS 2) CAPS Take 1 tablet by mouth 2 (two) times daily.   . sertraline (ZOLOFT) 50 MG tablet Take 50 mg by mouth daily.  Marland Kitchen VITAMIN D, CHOLECALCIFEROL, PO Take 4,000 Units by mouth daily.     Allergies:   Ativan [lorazepam] and Flagyl [metronidazole]   Social History   Socioeconomic History  . Marital status: Widowed    Spouse name: Not on file  . Number of children: 1  . Years of education: Not on file  . Highest education level: Not on file  Occupational History  . Not on file  Tobacco Use  . Smoking status: Never Smoker  . Smokeless tobacco: Never Used    Substance and Sexual Activity  . Alcohol use: No    Comment: occasional  . Drug use: No  . Sexual activity: Never  Other Topics Concern  . Not on file  Social History Narrative  . Not on file   Social Determinants of Health   Financial Resource Strain:   . Difficulty of Paying Living Expenses:   Food Insecurity:   . Worried About Charity fundraiser in the Last Year:   . Arboriculturist in the Last Year:   Transportation Needs:   . Film/video editor (Medical):   Marland Kitchen Lack of Transportation (Non-Medical):   Physical Activity:   . Days of Exercise per Week:   . Minutes of Exercise per Session:   Stress:   . Feeling of Stress :  Social Connections:   . Frequency of Communication with Friends and Family:   . Frequency of Social Gatherings with Friends and Family:   . Attends Religious Services:   . Active Member of Clubs or Organizations:   . Attends Archivist Meetings:   Marland Kitchen Marital Status:      Family History: The patient's family history includes Colon cancer in her brother; Hypertension in her father and mother; Liver cancer in her brother.  ROS:   Please see the history of present illness.    All other systems reviewed and are negative.  EKGs/Labs/Other Studies Reviewed:    The following studies were reviewed today: I discussed my findings with the patient at length.   Recent Labs: 10/09/2018: TSH 5.400 01/10/2019: B Natriuretic Peptide 1,111.1 01/11/2019: Magnesium 1.9 01/16/2019: ALT 24; BUN 56; Creatinine, Ser 1.06; Hemoglobin 12.1; Platelets 200; Potassium 4.9; Sodium 137  Recent Lipid Panel    Component Value Date/Time   CHOL 127 08/10/2016 1615   TRIG 201 (H) 01/08/2019 0358   HDL 32 (L) 08/10/2016 1615   CHOLHDL 4.0 08/10/2016 1615   CHOLHDL 3.2 12/23/2015 0425   VLDL 26 12/23/2015 0425   LDLCALC 31 08/10/2016 1615    Physical Exam:    VS:  BP (!) 160/76   Pulse (!) 56   Ht 5\' 5"  (1.651 m)   Wt 125 lb (56.7 kg)   SpO2 96%    BMI 20.80 kg/m     Wt Readings from Last 3 Encounters:  04/23/19 125 lb (56.7 kg)  01/08/19 117 lb 11.6 oz (53.4 kg)  10/09/18 128 lb (58.1 kg)     GEN: Patient is in no acute distress HEENT: Normal NECK: No JVD; No carotid bruits LYMPHATICS: No lymphadenopathy CARDIAC: Hear sounds regular, 2/6 systolic murmur at the apex. RESPIRATORY:  Clear to auscultation without rales, wheezing or rhonchi  ABDOMEN: Soft, non-tender, non-distended MUSCULOSKELETAL:  No edema; No deformity  SKIN: Warm and dry NEUROLOGIC:  Alert and oriented x 3 PSYCHIATRIC:  Normal affect   Signed, Jenean Lindau, MD  04/23/2019 2:56 PM    [place]

## 2019-04-23 NOTE — Patient Instructions (Signed)
Medication Instructions:  No medication changes *If you need a refill on your cardiac medications before your next appointment, please call your pharmacy*   Lab Work: Your physician recommends that you have labs today. We checked a BMET, CBC, TSH and LFT.  If you have labs (blood work) drawn today and your tests are completely normal, you will receive your results only by: Marland Kitchen MyChart Message (if you have MyChart) OR . A paper copy in the mail If you have any lab test that is abnormal or we need to change your treatment, we will call you to review the results.   Testing/Procedures: Chest xray   Follow-Up: At [facility], you and your health needs are our priority.  As part of our continuing mission to provide you with exceptional heart care, we have created designated Provider Care Teams.  These Care Teams include your primary Cardiologist (physician) and Advanced Practice Providers (APPs -  Physician Assistants and Nurse Practitioners) who all work together to provide you with the care you need, when you need it.  We recommend signing up for the patient portal called "MyChart".  Sign up information is provided on this After Visit Summary.  MyChart is used to connect with patients for Virtual Visits (Telemedicine).  Patients are able to view lab/test results, encounter notes, upcoming appointments, etc.  Non-urgent messages can be sent to your provider as well.   To learn more about what you can do with MyChart, go to [email or web address].    Your next appointment:   6 month(s)  The format for your next appointment:   In Person  Provider:   Jyl Heinz, MD   Other Instructions NA

## 2019-04-24 LAB — CBC WITH DIFFERENTIAL/PLATELET
Basophils Absolute: 0 10*3/uL (ref 0.0–0.2)
Basos: 1 %
EOS (ABSOLUTE): 0.2 10*3/uL (ref 0.0–0.4)
Eos: 3 %
Hematocrit: 33.5 % — ABNORMAL LOW (ref 34.0–46.6)
Hemoglobin: 11.3 g/dL (ref 11.1–15.9)
Immature Grans (Abs): 0 10*3/uL (ref 0.0–0.1)
Immature Granulocytes: 0 %
Lymphocytes Absolute: 1.2 10*3/uL (ref 0.7–3.1)
Lymphs: 21 %
MCH: 30.8 pg (ref 26.6–33.0)
MCHC: 33.7 g/dL (ref 31.5–35.7)
MCV: 91 fL (ref 79–97)
Monocytes Absolute: 0.4 10*3/uL (ref 0.1–0.9)
Monocytes: 7 %
Neutrophils Absolute: 4 10*3/uL (ref 1.4–7.0)
Neutrophils: 68 %
Platelets: 143 10*3/uL — ABNORMAL LOW (ref 150–450)
RBC: 3.67 x10E6/uL — ABNORMAL LOW (ref 3.77–5.28)
RDW: 13 % (ref 11.7–15.4)
WBC: 5.7 10*3/uL (ref 3.4–10.8)

## 2019-04-24 LAB — BASIC METABOLIC PANEL
BUN/Creatinine Ratio: 19 (ref 12–28)
BUN: 24 mg/dL (ref 10–36)
CO2: 24 mmol/L (ref 20–29)
Calcium: 9.6 mg/dL (ref 8.7–10.3)
Chloride: 103 mmol/L (ref 96–106)
Creatinine, Ser: 1.25 mg/dL — ABNORMAL HIGH (ref 0.57–1.00)
GFR calc Af Amer: 42 mL/min/{1.73_m2} — ABNORMAL LOW (ref >59)
GFR calc non Af Amer: 36 mL/min/{1.73_m2} — ABNORMAL LOW (ref >59)
Glucose: 104 mg/dL — ABNORMAL HIGH (ref 65–99)
Potassium: 4.6 mmol/L (ref 3.5–5.2)
Sodium: 139 mmol/L (ref 134–144)

## 2019-04-24 LAB — HEPATIC FUNCTION PANEL
ALT: 16 IU/L (ref 0–32)
AST: 24 IU/L (ref 0–40)
Albumin: 4.4 g/dL (ref 3.5–4.6)
Alkaline Phosphatase: 94 IU/L (ref 39–117)
Bilirubin Total: 0.6 mg/dL (ref 0.0–1.2)
Bilirubin, Direct: 0.18 mg/dL (ref 0.00–0.40)
Total Protein: 6.3 g/dL (ref 6.0–8.5)

## 2019-04-24 LAB — TSH: TSH: 6.53 u[IU]/mL — ABNORMAL HIGH (ref 0.450–4.500)

## 2019-05-08 ENCOUNTER — Other Ambulatory Visit: Payer: Self-pay | Admitting: Cardiology

## 2019-09-28 ENCOUNTER — Other Ambulatory Visit: Payer: Self-pay | Admitting: Internal Medicine

## 2019-09-28 DIAGNOSIS — Z1231 Encounter for screening mammogram for malignant neoplasm of breast: Secondary | ICD-10-CM

## 2019-10-26 ENCOUNTER — Other Ambulatory Visit: Payer: Self-pay

## 2019-10-26 DIAGNOSIS — R112 Nausea with vomiting, unspecified: Secondary | ICD-10-CM | POA: Insufficient documentation

## 2019-10-26 DIAGNOSIS — I2510 Atherosclerotic heart disease of native coronary artery without angina pectoris: Secondary | ICD-10-CM | POA: Insufficient documentation

## 2019-10-26 DIAGNOSIS — H409 Unspecified glaucoma: Secondary | ICD-10-CM | POA: Insufficient documentation

## 2019-10-26 DIAGNOSIS — I4891 Unspecified atrial fibrillation: Secondary | ICD-10-CM | POA: Insufficient documentation

## 2019-10-26 DIAGNOSIS — Z973 Presence of spectacles and contact lenses: Secondary | ICD-10-CM | POA: Insufficient documentation

## 2019-10-26 DIAGNOSIS — R233 Spontaneous ecchymoses: Secondary | ICD-10-CM | POA: Insufficient documentation

## 2019-10-26 DIAGNOSIS — R238 Other skin changes: Secondary | ICD-10-CM | POA: Insufficient documentation

## 2019-10-26 DIAGNOSIS — M1990 Unspecified osteoarthritis, unspecified site: Secondary | ICD-10-CM | POA: Insufficient documentation

## 2019-10-26 DIAGNOSIS — N950 Postmenopausal bleeding: Secondary | ICD-10-CM | POA: Insufficient documentation

## 2019-10-26 DIAGNOSIS — E785 Hyperlipidemia, unspecified: Secondary | ICD-10-CM | POA: Insufficient documentation

## 2019-10-26 DIAGNOSIS — Z853 Personal history of malignant neoplasm of breast: Secondary | ICD-10-CM | POA: Insufficient documentation

## 2019-10-26 DIAGNOSIS — Z8619 Personal history of other infectious and parasitic diseases: Secondary | ICD-10-CM | POA: Insufficient documentation

## 2019-10-26 DIAGNOSIS — K219 Gastro-esophageal reflux disease without esophagitis: Secondary | ICD-10-CM | POA: Insufficient documentation

## 2019-10-26 DIAGNOSIS — I471 Supraventricular tachycardia: Secondary | ICD-10-CM | POA: Insufficient documentation

## 2019-10-26 DIAGNOSIS — H3530 Unspecified macular degeneration: Secondary | ICD-10-CM | POA: Insufficient documentation

## 2019-10-26 DIAGNOSIS — Z9889 Other specified postprocedural states: Secondary | ICD-10-CM | POA: Insufficient documentation

## 2019-10-26 DIAGNOSIS — I10 Essential (primary) hypertension: Secondary | ICD-10-CM | POA: Insufficient documentation

## 2019-10-29 ENCOUNTER — Ambulatory Visit: Payer: Medicare HMO | Admitting: Cardiology

## 2019-10-29 ENCOUNTER — Encounter: Payer: Self-pay | Admitting: Cardiology

## 2019-10-29 ENCOUNTER — Other Ambulatory Visit: Payer: Self-pay

## 2019-10-29 VITALS — BP 138/58 | HR 52 | Ht 65.0 in | Wt 128.0 lb

## 2019-10-29 DIAGNOSIS — E782 Mixed hyperlipidemia: Secondary | ICD-10-CM | POA: Diagnosis not present

## 2019-10-29 DIAGNOSIS — I10 Essential (primary) hypertension: Secondary | ICD-10-CM | POA: Diagnosis not present

## 2019-10-29 DIAGNOSIS — I2510 Atherosclerotic heart disease of native coronary artery without angina pectoris: Secondary | ICD-10-CM | POA: Diagnosis not present

## 2019-10-29 MED ORDER — AMIODARONE HCL 200 MG PO TABS: 100.0000 mg | ORAL_TABLET | ORAL | 1 refills | Status: DC

## 2019-10-29 NOTE — Patient Instructions (Signed)
Medication Instructions:  Your physician has recommended you make the following change in your medication:   Take Amiodarone 100 mg every other day. Take Metoprolol once daily.  *If you need a refill on your cardiac medications before your next appointment, please call your pharmacy*   Lab Work: None ordered If you have labs (blood work) drawn today and your tests are completely normal, you will receive your results only by: Marland Kitchen MyChart Message (if you have MyChart) OR . A paper copy in the mail If you have any lab test that is abnormal or we need to change your treatment, we will call you to review the results.   Testing/Procedures: None ordered   Follow-Up: At [facility], you and your health needs are our priority.  As part of our continuing mission to provide you with exceptional heart care, we have created designated Provider Care Teams.  These Care Teams include your primary Cardiologist (physician) and Advanced Practice Providers (APPs -  Physician Assistants and Nurse Practitioners) who all work together to provide you with the care you need, when you need it.  We recommend signing up for the patient portal called "MyChart".  Sign up information is provided on this After Visit Summary.  MyChart is used to connect with patients for Virtual Visits (Telemedicine).  Patients are able to view lab/test results, encounter notes, upcoming appointments, etc.  Non-urgent messages can be sent to your provider as well.   To learn more about what you can do with MyChart, go to [email or web address].    Your next appointment:   3 month(s)  The format for your next appointment:   In Person  Provider:   Jyl Heinz, MD   Other Instructions NA

## 2019-10-29 NOTE — Progress Notes (Signed)
Cardiology Office Note:    Date:  10/29/2019   ID:  Sheri Hensley, DOB 07-23-1919, MRN [id number]  PCP:  Josetta Huddle, MD  Cardiologist:  Jenean Lindau, MD   Referring MD: Josetta Huddle, MD    ASSESSMENT:    1. Essential hypertension   2. Coronary artery disease involving native coronary artery of native heart without angina pectoris   3. Mixed hyperlipidemia    PLAN:    In order of problems listed above:  1. Coronary artery disease: Secondary prevention stressed with the patient.  Importance of compliance with diet medication stressed vocalized understanding.  Lipids are managed by primary care physician.   2. Essential hypertension: Blood pressure stable and diet was emphasized. 3. Paroxysmal atrial fibrillation:I discussed with the patient atrial fibrillation, disease process. Management and therapy including rate and rhythm control, anticoagulation benefits and potential risks were discussed extensively with the patient. Patient had multiple questions which were answered to patient's satisfaction. 4. In view of bradycardia I reduced her amiodarone to half tablet every other day which is 100 mg every other day and metoprolol 12.5 mg only in the morning. 5. Mixed dyslipidemia: Stable at this time and followed by primary care physician.  I will see if she has her liver panel checked in view of the fact that she is on amiodarone if not we will obtain it today.  6. Patient will be seen in follow-up appointment in 3 months or earlier if the patient has any concerns      Medication Adjustments/Labs and Tests Ordered: Current medicines are reviewed at length with the patient today.  Concerns regarding medicines are outlined above.  No orders of the defined types were placed in this encounter.  No orders of the defined types were placed in this encounter.    No chief complaint on file.    History of Present Illness:    Sheri Hensley is a 86 y.o. female.   Patient has past medical history of paroxysmal defibrillation, essential hypertension.  She denies any problems at this time.  She is frail but overall fine.  She ambulates with walker. I do not think she has steady gait and therefore she is not on anticoagulation.  No chest pain orthopnea or PND.  At the time of my evaluation, the patient is alert awake oriented and in no distress.  Past Medical History:  Diagnosis Date  . Acute respiratory failure with hypoxia ([place]) 01/08/2019  . Atrial fibrillation ([place])   . Bruises easily   . Cancer ([place]) 1998   left tamoxifem x 5 yrs  . Coronary artery disease    CARDIOLOGIST-  DR Geraldo Pitter ([facility])  . COVID-19 virus infection 01/08/2019  . Elevated bilirubin 12/23/2015  . Endometrial cancer ([place])   . Endometrial cancer ([place])   . Essential hypertension 10/24/2014  . Fall 06/2017   Patient fell and hit her head , patient's daughter's states patient may have fell on the kitchen counter  . GERD (gastroesophageal reflux disease)   . Glaucoma, both eyes   . History of breast cancer    1998--  s/p  left mastectomy with sln dissection's  ,  no chemo or radiation-  no recurrence  . History of Clostridium difficile infection 5-6 yrs ago  . History of TIAs 12/22/2015   yrs ago ? AND QUESTIONABLE ONE YRS AGO  . Hyperlipidemia   . Hypertension   . Hyponatremia 12/23/2015  . Macular degeneration   . Narrow complex tachycardia (  [facility])    cardiologist-  dr Salley Scarlet  . OA (osteoarthritis)    right knee  . Paroxysmal atrial fibrillation ([place]) 12/23/2015  . PMB (postmenopausal bleeding)   . Pneumonia due to COVID-19 virus 01/08/2019  . PONV (postoperative nausea and vomiting)    ponv after appendectomy 1954, DID WELL AFTER D AND C RECENT  . PONV (postoperative nausea and vomiting)   . Postmenopausal bleeding 11/10/2015  . Thickened endometrium   . TIA (transient ischemic attack) 12/22/2015  . Wears glasses     Past Surgical History:    Procedure Laterality Date  . APPENDECTOMY  1954  . CATARACT EXTRACTION W/ INTRAOCULAR LENS  IMPLANT, BILATERAL  1994  . colonscopy    . DILATION AND CURETTAGE OF UTERUS N/A 11/20/2015   Procedure: DILATATION AND CURETTAGE;  Surgeon: Everitt Amber, MD;  Location: [facility];  Service: Gynecology;  Laterality: N/A;  . DILATION AND CURETTAGE, DIAGNOSTIC / THERAPEUTIC N/A novmber 2017   per daughter with biopsy as stated  . KNEE ARTHROSCOPY Right 2002  . MASTECTOMY Left 1998   w/ lympn node dissection's  . ROBOTIC ASSISTED TOTAL HYSTERECTOMY WITH BILATERAL SALPINGO OOPHERECTOMY Bilateral 01/27/2016   Procedure: XI ROBOTIC ASSISTED TOTAL HYSTERECTOMY WITH BILATERAL SALPINGO OOPHORECTOMY;  Surgeon: Everitt Amber, MD;  Location: [facility];  Service: Gynecology;  Laterality: Bilateral;    Current Medications: Current Meds  Medication Sig  . acetaminophen (TYLENOL) 325 MG tablet Take 2 tablets (650 mg total) by mouth every 6 (six) hours as needed for mild pain or headache (fever >/= 101).  Marland Kitchen amiodarone (PACERONE) 200 MG tablet TAKE 1/2 TABLET BY MOUTH EVERY MORNING  . amLODipine (NORVASC) 2.5 MG tablet Take 2.5 mg by mouth daily.  Marland Kitchen atorvastatin (LIPITOR) 10 MG tablet Take 1 tablet (10 mg total) by mouth at bedtime.  . bimatoprost (LUMIGAN) 0.01 % SOLN Place 1 drop into both eyes at bedtime.  . brimonidine (ALPHAGAN P) 0.1 % SOLN Place 1 drop into both eyes every 8 (eight) hours.   . clopidogrel (PLAVIX) 75 MG tablet Take 1 tablet (75 mg total) by mouth daily.  . cyanocobalamin (V-R VITAMIN B-12) 500 MCG tablet Take 500 mcg by mouth daily.   Marland Kitchen loratadine (CLARITIN) 10 MG tablet Take 10 mg by mouth at bedtime.   Marland Kitchen losartan-hydrochlorothiazide (HYZAAR) 50-12.5 MG tablet Take 1 tablet by mouth daily.  . Melatonin 10 MG CAPS Take 10 mg by mouth at bedtime.   . metoprolol tartrate (LOPRESSOR) 25 MG tablet Take 12.5 mg by mouth 2 (two) times daily.   . Multiple Vitamins-Minerals (CENTRUM  VITAMINTS PO) Take 1 tablet by mouth every morning.  . Multiple Vitamins-Minerals (ICAPS AREDS 2) CAPS Take 1 tablet by mouth 2 (two) times daily.   . sertraline (ZOLOFT) 50 MG tablet Take 50 mg by mouth daily.  Marland Kitchen VITAMIN D, CHOLECALCIFEROL, PO Take 4,000 Units by mouth daily.     Allergies:   Ativan [lorazepam] and Flagyl [metronidazole]   Social History   Socioeconomic History  . Marital status: Widowed    Spouse name: Not on file  . Number of children: 1  . Years of education: Not on file  . Highest education level: Not on file  Occupational History  . Not on file  Tobacco Use  . Smoking status: Never Smoker  . Smokeless tobacco: Never Used  Vaping Use  . Vaping Use: Never used  Substance and Sexual Activity  . Alcohol use: No    Comment: occasional  .  Drug use: No  . Sexual activity: Never  Other Topics Concern  . Not on file  Social History Narrative  . Not on file   Social Determinants of Health   Financial Resource Strain:   . Difficulty of Paying Living Expenses: Not on file  Food Insecurity:   . Worried About Charity fundraiser in the Last Year: Not on file  . Ran Out of Food in the Last Year: Not on file  Transportation Needs:   . Lack of Transportation (Medical): Not on file  . Lack of Transportation (Non-Medical): Not on file  Physical Activity:   . Days of Exercise per Week: Not on file  . Minutes of Exercise per Session: Not on file  Stress:   . Feeling of Stress : Not on file  Social Connections:   . Frequency of Communication with Friends and Family: Not on file  . Frequency of Social Gatherings with Friends and Family: Not on file  . Attends Religious Services: Not on file  . Active Member of Clubs or Organizations: Not on file  . Attends Archivist Meetings: Not on file  . Marital Status: Not on file     Family History: The patient's family history includes Colon cancer in her brother; Hypertension in her father and mother; Liver  cancer in her brother.  ROS:   Please see the history of present illness.    All other systems reviewed and are negative.  EKGs/Labs/Other Studies Reviewed:    The following studies were reviewed today: EKG reveals sinus bradycardia first-degree AV block and nonspecific ST-T changes   Recent Labs: 01/10/2019: B Natriuretic Peptide 1,111.1 01/11/2019: Magnesium 1.9 04/23/2019: ALT 16; BUN 24; Creatinine, Ser 1.25; Hemoglobin 11.3; Platelets 143; Potassium 4.6; Sodium 139; TSH 6.530  Recent Lipid Panel    Component Value Date/Time   CHOL 127 08/10/2016 1615   TRIG 201 (H) 01/08/2019 0358   HDL 32 (L) 08/10/2016 1615   CHOLHDL 4.0 08/10/2016 1615   CHOLHDL 3.2 12/23/2015 0425   VLDL 26 12/23/2015 0425   LDLCALC 31 08/10/2016 1615    Physical Exam:    VS:  BP (!) 138/58   Pulse (!) 52   Ht 5\' 5"  (1.651 m)   Wt 128 lb (58.1 kg)   SpO2 100%   BMI 21.30 kg/m     Wt Readings from Last 3 Encounters:  10/29/19 128 lb (58.1 kg)  04/23/19 125 lb (56.7 kg)  01/08/19 117 lb 11.6 oz (53.4 kg)     GEN: Patient is in no acute distress HEENT: Normal NECK: No JVD; No carotid bruits LYMPHATICS: No lymphadenopathy CARDIAC: Hear sounds regular, 2/6 systolic murmur at the apex. RESPIRATORY:  Clear to auscultation without rales, wheezing or rhonchi  ABDOMEN: Soft, non-tender, non-distended MUSCULOSKELETAL:  No edema; No deformity  SKIN: Warm and dry NEUROLOGIC:  Alert and oriented x 3 PSYCHIATRIC:  Normal affect   Signed, Jenean Lindau, MD  10/29/2019 3:35 PM    [facility]

## 2019-10-30 ENCOUNTER — Other Ambulatory Visit: Payer: Self-pay | Admitting: Cardiology

## 2019-10-30 LAB — COMPREHENSIVE METABOLIC PANEL
ALT: 20 IU/L (ref 0–32)
AST: 27 IU/L (ref 0–40)
Albumin/Globulin Ratio: 2.2 (ref 1.2–2.2)
Albumin: 4.6 g/dL (ref 3.5–4.6)
Alkaline Phosphatase: 86 IU/L (ref 44–121)
BUN/Creatinine Ratio: 20 (ref 12–28)
BUN: 29 mg/dL (ref 10–36)
Bilirubin Total: 0.8 mg/dL (ref 0.0–1.2)
CO2: 23 mmol/L (ref 20–29)
Calcium: 9.4 mg/dL (ref 8.7–10.3)
Chloride: 98 mmol/L (ref 96–106)
Creatinine, Ser: 1.42 mg/dL — ABNORMAL HIGH (ref 0.57–1.00)
GFR calc Af Amer: 36 mL/min/{1.73_m2} — ABNORMAL LOW (ref >59)
GFR calc non Af Amer: 31 mL/min/{1.73_m2} — ABNORMAL LOW (ref >59)
Globulin, Total: 2.1 g/dL (ref 1.5–4.5)
Glucose: 102 mg/dL — ABNORMAL HIGH (ref 65–99)
Potassium: 4.3 mmol/L (ref 3.5–5.2)
Sodium: 136 mmol/L (ref 134–144)
Total Protein: 6.7 g/dL (ref 6.0–8.5)

## 2019-10-30 LAB — TSH: TSH: 6.12 u[IU]/mL — ABNORMAL HIGH (ref 0.450–4.500)

## 2019-11-01 ENCOUNTER — Ambulatory Visit: Payer: Medicare HMO

## 2019-11-09 ENCOUNTER — Other Ambulatory Visit: Payer: Self-pay | Admitting: Cardiology

## 2019-11-09 MED ORDER — AMIODARONE HCL 200 MG PO TABS: 100.0000 mg | ORAL_TABLET | ORAL | 1 refills | Status: DC

## 2019-11-09 NOTE — Telephone Encounter (Signed)
Refill sent in per request.  

## 2019-11-09 NOTE — Telephone Encounter (Signed)
    *  STAT* If patient is at the pharmacy, call can be transferred to refill team.   1. Which medications need to be refilled? (please list name of each medication and dose if known)   amiodarone (PACERONE) 200 MG tablet    2. Which pharmacy/location (including street and city if local pharmacy) is medication to be sent to? [id number] - [place], [place] - [address]. AT [address]  3. Do they need a 30 day or 90 day supply? 90 days   Pt is comepletely out of medications

## 2019-11-14 ENCOUNTER — Ambulatory Visit: Payer: Medicare HMO

## 2019-12-17 ENCOUNTER — Ambulatory Visit: Payer: Medicare HMO

## 2020-01-03 ENCOUNTER — Ambulatory Visit
Admission: RE | Admit: 2020-01-03 | Discharge: 2020-01-03 | Disposition: A | Discharge status: Home / Self Care | Payer: Medicare HMO | Source: Ambulatory Visit | Attending: Internal Medicine | Admitting: Internal Medicine

## 2020-01-03 ENCOUNTER — Other Ambulatory Visit: Payer: Self-pay

## 2020-01-03 DIAGNOSIS — Z1231 Encounter for screening mammogram for malignant neoplasm of breast: Secondary | ICD-10-CM

## 2020-02-05 ENCOUNTER — Ambulatory Visit: Payer: Medicare HMO | Admitting: Cardiology

## 2020-03-11 ENCOUNTER — Other Ambulatory Visit: Payer: Self-pay

## 2020-03-11 DIAGNOSIS — R9389 Abnormal findings on diagnostic imaging of other specified body structures: Secondary | ICD-10-CM

## 2020-03-11 DIAGNOSIS — K5900 Constipation, unspecified: Secondary | ICD-10-CM

## 2020-03-11 DIAGNOSIS — E559 Vitamin D deficiency, unspecified: Secondary | ICD-10-CM | POA: Insufficient documentation

## 2020-03-11 DIAGNOSIS — R54 Age-related physical debility: Secondary | ICD-10-CM

## 2020-03-11 DIAGNOSIS — Z90710 Acquired absence of both cervix and uterus: Secondary | ICD-10-CM

## 2020-03-11 DIAGNOSIS — B029 Zoster without complications: Secondary | ICD-10-CM

## 2020-03-11 DIAGNOSIS — I129 Hypertensive chronic kidney disease with stage 1 through stage 4 chronic kidney disease, or unspecified chronic kidney disease: Secondary | ICD-10-CM | POA: Insufficient documentation

## 2020-03-11 DIAGNOSIS — N3281 Overactive bladder: Secondary | ICD-10-CM

## 2020-03-11 DIAGNOSIS — E1169 Type 2 diabetes mellitus with other specified complication: Secondary | ICD-10-CM

## 2020-03-11 DIAGNOSIS — D6869 Other thrombophilia: Secondary | ICD-10-CM

## 2020-03-11 HISTORY — DX: Zoster without complications: B02.9

## 2020-03-11 HISTORY — DX: Other thrombophilia: D68.69

## 2020-03-11 HISTORY — DX: Age-related physical debility: R54

## 2020-03-11 HISTORY — DX: Vitamin D deficiency, unspecified: E55.9

## 2020-03-11 HISTORY — DX: Type 2 diabetes mellitus with other specified complication: E11.69

## 2020-03-11 HISTORY — DX: Hypertensive chronic kidney disease with stage 1 through stage 4 chronic kidney disease, or unspecified chronic kidney disease: I12.9

## 2020-03-11 HISTORY — DX: Abnormal findings on diagnostic imaging of other specified body structures: R93.89

## 2020-03-11 HISTORY — DX: Overactive bladder: N32.81

## 2020-03-11 HISTORY — DX: Acquired absence of both cervix and uterus: Z90.710

## 2020-03-11 HISTORY — DX: Constipation, unspecified: K59.00

## 2020-03-13 ENCOUNTER — Encounter: Payer: Self-pay | Admitting: Cardiology

## 2020-03-13 ENCOUNTER — Ambulatory Visit (HOSPITAL_BASED_OUTPATIENT_CLINIC_OR_DEPARTMENT_OTHER)
Admission: RE | Admit: 2020-03-13 | Discharge: 2020-03-13 | Disposition: A | Discharge status: Home / Self Care | Payer: Medicare HMO | Source: Ambulatory Visit | Attending: Cardiology | Admitting: Cardiology

## 2020-03-13 ENCOUNTER — Other Ambulatory Visit: Payer: Self-pay

## 2020-03-13 ENCOUNTER — Ambulatory Visit: Payer: Medicare HMO | Admitting: Cardiology

## 2020-03-13 VITALS — BP 138/78 | HR 75 | Ht 65.0 in | Wt 134.0 lb

## 2020-03-13 DIAGNOSIS — I2510 Atherosclerotic heart disease of native coronary artery without angina pectoris: Secondary | ICD-10-CM

## 2020-03-13 DIAGNOSIS — G459 Transient cerebral ischemic attack, unspecified: Secondary | ICD-10-CM | POA: Diagnosis not present

## 2020-03-13 DIAGNOSIS — E782 Mixed hyperlipidemia: Secondary | ICD-10-CM

## 2020-03-13 DIAGNOSIS — Z79899 Other long term (current) drug therapy: Secondary | ICD-10-CM | POA: Insufficient documentation

## 2020-03-13 DIAGNOSIS — I480 Paroxysmal atrial fibrillation: Secondary | ICD-10-CM

## 2020-03-13 DIAGNOSIS — E1169 Type 2 diabetes mellitus with other specified complication: Secondary | ICD-10-CM

## 2020-03-13 DIAGNOSIS — I10 Essential (primary) hypertension: Secondary | ICD-10-CM

## 2020-03-13 MED ORDER — FISH OIL 1000 MG PO CAPS: 2000.0000 mg | ORAL_CAPSULE | Freq: Two times a day (BID) | ORAL | 1 refills | Status: DC

## 2020-03-13 NOTE — Progress Notes (Signed)
Cardiology Office Note:    Date:  03/13/2020   ID:  Sheri Hensley, DOB 09-15-1919, MRN [id number]  PCP:  Josetta Huddle, MD  Cardiologist:  Jenean Lindau, MD   Referring MD: Josetta Huddle, MD    ASSESSMENT:    1. TIA (transient ischemic attack)   2. Essential (primary) hypertension   3. Paroxysmal atrial fibrillation (HCC)   4. Coronary artery disease involving native coronary artery of native heart without angina pectoris   5. Mixed hyperlipidemia   6. Type 2 diabetes mellitus with other specified complication, without long-term current use of insulin (HCC)    PLAN:    In order of problems listed above:  1. Coronary artery disease: Secondary prevention stressed with the patient. Importance of compliance with diet medication stressed and she vocalized understanding. 2. Paroxysmal atrial fibrillation:I discussed with the patient atrial fibrillation, disease process. Management and therapy including rate and rhythm control, anticoagulation benefits and potential risks were discussed extensively with the patient. Patient had multiple questions which were answered to patient's satisfaction. Patient is on amiodarone therapy so we will do blood work including LFTs and chest x-ray will be done today. 3. Mixed dyslipidemia: Diet was emphasized. She is on statins and tolerating them well. Triglycerides were elevated. Diet was emphasized. She was told to take fish oil 2 g twice daily. 4. Essential hypertension: Blood pressure stable and diet was emphasized. 5. Patient will be seen in follow-up appointment in 6 months or earlier if the patient has any concerns     Medication Adjustments/Labs and Tests Ordered: Current medicines are reviewed at length with the patient today.  Concerns regarding medicines are outlined above.  No orders of the defined types were placed in this encounter.  No orders of the defined types were placed in this encounter.    No chief complaint on  file.    History of Present Illness:    Sheri Hensley is a 87 y.o. female. Patient has past medical history of coronary artery disease, dyslipidemia hypertension. She denies any problems at this time and takes care of activities of daily living. No chest pain orthopnea or PND. Her medications have been cut down because of bradycardia and she is much better now. Her daughter accompanies her for this visit and is very supportive and meticulous about her care. At the time of my evaluation, the patient is alert awake oriented and in no distress.  Past Medical History:  Diagnosis Date  . Acquired absence of both cervix and uterus 03/11/2020  . Acquired thrombophilia ([place]) 03/11/2020  . Acute respiratory failure with hypoxia ([place]) 01/08/2019  . Atrial fibrillation ([place])   . Bruises easily   . Cancer ([place]) 1998   left tamoxifem x 5 yrs  . Chronic kidney disease due to hypertension 03/11/2020  . Constipation 03/11/2020  . Coronary artery disease    CARDIOLOGIST-  DR Geraldo Pitter ([facility])  . COVID-19 virus infection 01/08/2019  . Elevated bilirubin 12/23/2015  . Endometrial cancer ([place])   . Endometrial cancer ([place])   . Essential (primary) hypertension 10/24/2014  . Essential hypertension 10/24/2014  . Fall 06/2017   Patient fell and hit her head , patient's daughter's states patient may have fell on the kitchen counter  . Falls 06/2017   Patient fell and hit her head , patient's daughter's states patient may have fell on the kitchen counter  . Frailty 03/11/2020  . GERD (gastroesophageal reflux disease)   . Glaucoma, both eyes   .  Herpes zoster without complication 4/80/1655  . History of breast cancer    1998--  s/p  left mastectomy with sln dissection's  ,  no chemo or radiation-  no recurrence  . History of Clostridium difficile infection 5-6 yrs ago  . History of TIAs 12/22/2015   yrs ago ? AND QUESTIONABLE ONE YRS AGO  . Hyperlipidemia   . Hypertension   .  Hyponatremia 12/23/2015  . Macular degeneration   . Narrow complex tachycardia [facility])    cardiologist-  dr Salley Scarlet  . OA (osteoarthritis)    right knee  . Overactive bladder 03/11/2020  . Paroxysmal atrial fibrillation ([place]) 12/23/2015  . Personal history of transient ischemic attack (TIA), and cerebral infarction without residual deficits 12/22/2015   yrs ago ? AND QUESTIONABLE ONE YRS AGO  . PMB (postmenopausal bleeding)   . Pneumonia due to COVID-19 virus 01/08/2019  . PONV (postoperative nausea and vomiting)    ponv after appendectomy 1954, DID WELL AFTER D AND C RECENT  . PONV (postoperative nausea and vomiting)   . Post-traumatic osteoarthritis of both knees 10/15/2013  . Postmenopausal bleeding 11/10/2015  . 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 Standard chest x-ray abnormal 03/11/2020  . Thickened endometrium   . TIA (transient ischemic attack) 12/22/2015  . Type 2 diabetes mellitus with other specified complication ([place]) 3/74/8270  . Vitamin D deficiency 03/11/2020  . Wears glasses     Past Surgical History:  Procedure Laterality Date  . APPENDECTOMY  1954  . CATARACT EXTRACTION W/ INTRAOCULAR LENS  IMPLANT, BILATERAL  1994  . colonscopy    . DILATION AND CURETTAGE OF UTERUS N/A 11/20/2015   Procedure: DILATATION AND CURETTAGE;  Surgeon: Everitt Amber, MD;  Location: [facility];  Service: Gynecology;  Laterality: N/A;  . DILATION AND CURETTAGE, DIAGNOSTIC / THERAPEUTIC N/A novmber 2017   per daughter with biopsy as stated  . KNEE ARTHROSCOPY Right 2002  . MASTECTOMY Left 1998   w/ lympn node dissection's  . ROBOTIC ASSISTED TOTAL HYSTERECTOMY WITH BILATERAL SALPINGO OOPHERECTOMY Bilateral 01/27/2016   Procedure: XI ROBOTIC ASSISTED TOTAL HYSTERECTOMY WITH BILATERAL SALPINGO OOPHORECTOMY;  Surgeon: Everitt Amber, MD;  Location: [facility];  Service: Gynecology;  Laterality: Bilateral;    Current Medications: No outpatient medications have been marked  as taking for the 03/13/20 encounter (Office Visit) with Revankar, Reita Cliche, MD.     Allergies:   Ativan [lorazepam] and Metronidazole   Social History   Socioeconomic History  . Marital status: Widowed    Spouse name: Not on file  . Number of children: 1  . Years of education: Not on file  . Highest education level: Not on file  Occupational History  . Not on file  Tobacco Use  . Smoking status: Never Smoker  . Smokeless tobacco: Never Used  Vaping Use  . Vaping Use: Never used  Substance and Sexual Activity  . Alcohol use: No    Comment: occasional  . Drug use: No  . Sexual activity: Never  Other Topics Concern  . Not on file  Social History Narrative  . Not on file   Social Determinants of Health   Financial Resource Strain: Not on file  Food Insecurity: Not on file  Transportation Needs: Not on file  Physical Activity: Not on file  Stress: Not on file  Social Connections: Not on file     Family History: The patient's family history includes Colon cancer in her brother; Hypertension in her father  and mother; Liver cancer in her brother.  ROS:   Please see the history of present illness.    All other systems reviewed and are negative.  EKGs/Labs/Other Studies Reviewed:    The following studies were reviewed today: EKG with sinus rhythm and nonspecific ST-T changes   Recent Labs: 04/23/2019: Hemoglobin 11.3; Platelets 143 10/29/2019: ALT 20; BUN 29; Creatinine, Ser 1.42; Potassium 4.3; Sodium 136; TSH 6.120  Recent Lipid Panel    Component Value Date/Time   CHOL 127 08/10/2016 1615   TRIG 201 (H) 01/08/2019 0358   HDL 32 (L) 08/10/2016 1615   CHOLHDL 4.0 08/10/2016 1615   CHOLHDL 3.2 12/23/2015 0425   VLDL 26 12/23/2015 0425   LDLCALC 31 08/10/2016 1615    Physical Exam:    VS:  There were no vitals taken for this visit.    Wt Readings from Last 3 Encounters:  10/29/19 128 lb (58.1 kg)  04/23/19 125 lb (56.7 kg)  01/08/19 117 lb 11.6 oz (53.4  kg)     GEN: Patient is in no acute distress HEENT: Normal NECK: No JVD; No carotid bruits LYMPHATICS: No lymphadenopathy CARDIAC: Hear sounds regular, 2/6 systolic murmur at the apex. RESPIRATORY:  Clear to auscultation without rales, wheezing or rhonchi  ABDOMEN: Soft, non-tender, non-distended MUSCULOSKELETAL:  No edema; No deformity  SKIN: Warm and dry NEUROLOGIC:  Alert and oriented x 3 PSYCHIATRIC:  Normal affect   Signed, Jenean Lindau, MD  03/13/2020 11:17 AM    [place]

## 2020-03-13 NOTE — Patient Instructions (Signed)
Medication Instructions:  Your physician has recommended you make the following change in your medication:   START: Fish oil 2000 mg twice daily   *If you need a refill on your cardiac medications before your next appointment, please call your pharmacy*   Lab Work: Your physician recommends that you return for lab work today: bmp, tsh, lft  If you have labs (blood work) drawn today and your tests are completely normal, you will receive your results only by: Sheri Hensley MyChart Message (if you have MyChart) OR . A paper copy in the mail If you have any lab test that is abnormal or we need to change your treatment, we will call you to review the results.   Testing/Procedures: A chest x-ray takes a picture of the organs and structures inside the chest, including the heart, lungs, and blood vessels. This test can show several things, including, whether the heart is enlarges; whether fluid is building up in the lungs; and whether pacemaker / defibrillator leads are still in place.    Follow-Up: At [facility], you and your health needs are our priority.  As part of our continuing mission to provide you with exceptional heart care, we have created designated Provider Care Teams.  These Care Teams include your primary Cardiologist (physician) and Advanced Practice Providers (APPs -  Physician Assistants and Nurse Practitioners) who all work together to provide you with the care you need, when you need it.  We recommend signing up for the patient portal called "MyChart".  Sign up information is provided on this After Visit Summary.  MyChart is used to connect with patients for Virtual Visits (Telemedicine).  Patients are able to view lab/test results, encounter notes, upcoming appointments, etc.  Non-urgent messages can be sent to your provider as well.   To learn more about what you can do with MyChart, go to [email or web address].    Your next appointment:   6 month(s)  The format for your next  appointment:   In Person  Provider:   Jyl Heinz, MD   Other Instructions  Fish Oil, Omega-3 Fatty Acids capsules (OTC) What is this medicine? FISH OIL, OMEGA-3 FATTY ACIDS (Fish Oil, oh MAY ga - 3 fatty AS ids) are essential fats. It is promoted to help support a healthy heart. This dietary supplement is used to add to a healthy diet. The FDA has not approved this supplement for any medical use. This supplement may be used for other purposes; ask your health care provider or pharmacist if you have questions. This medicine may be used for other purposes; ask your health care provider or pharmacist if you have questions. COMMON BRAND NAME(S): Omega-3, Omega-3 Fish Oil, OMEGA-3 IQ DHA, TherOmega, THEROMEGA SPORT What should I tell my health care provider before I take this medicine? They need to know if you have any of these conditions  bleeding problems  lung or breathing disease, like asthma  an unusual or allergic reaction to fish oil, omega-3 fatty acids, fish, other medicines, foods, dyes, or preservatives  pregnant or trying to get pregnant  breast-feeding How should I use this medicine? Take this medicine by mouth with a glass of water. Follow the directions on the package or prescription label. Take with food. Take your medicine at regular intervals. Do not take your medicine more often than directed. Talk to your pediatrician regarding the use of this medicine in children. Special care may be needed. This medicine should not be used in children without a doctor's advice.  Overdosage: If you think you have taken too much of this medicine contact a poison control center or emergency room at once. NOTE: This medicine is only for you. Do not share this medicine with others. What if I miss a dose? If you miss a dose, take it as soon as you can. If it is almost time for your next dose, take only that dose. Do not take double or extra doses. What may interact with this  medicine?  aspirin and aspirin-like medicines  herbal products like danshen, dong quai, garlic pills, ginger, ginkgo biloba, horse chestnut, willow bark, and others  medicines that treat or prevent blood clots like enoxaparin, heparin, warfarin This list may not describe all possible interactions. Give your health care provider a list of all the medicines, herbs, non-prescription drugs, or dietary supplements you use. Also tell them if you smoke, drink alcohol, or use illegal drugs. Some items may interact with your medicine. What should I watch for while using this medicine? Follow a good diet and exercise plan. Taking a dietary supplement does not replace a healthy lifestyle. Some foods that have omega-3 fatty acids naturally are fatty fish like albacore tuna, halibut, herring, mackerel, lake trout, salmon, and sardines. Too much of this supplement can be unsafe. Talk to your doctor or health care provider about how much of this supplement is right for you. If you are scheduled for any medical or dental procedure, tell your healthcare provider that you are taking this medicine. You may need to stop taking this medicine before the procedure. Herbal or dietary supplements are not regulated like medicines. Rigid quality control standards are not required for dietary supplements. The purity and strength of these products can vary. The safety and effect of this dietary supplement for a certain disease or illness is not well known. This product is not intended to diagnose, treat, cure or prevent any disease. The Food and Drug Administration suggests the following to help consumers protect themselves:  Always read product labels and follow directions.  Natural does not mean a product is safe for humans to take.  Look for products that include USP after the ingredient name. This means that the manufacturer followed the standards of the [place] Pharmacopoeia.  Supplements made or sold by a nationally known  food or drug company are more likely to be made under tight controls. You can write to the company for more information about how the product was made. What side effects may I notice from receiving this medicine? Side effects that you should report to your doctor or health care professional as soon as possible:  allergic reactions like skin rash, itching or hives, swelling of the face, lips, or tongue  breathing problems  changes in your moods or emotions  unusual bleeding or bruising Side effects that usually do not require medical attention (report to your doctor or health care professional if they continue or are bothersome):  bad or fishy breath  belching  diarrhea  nausea  stomach gas, upset  weight gain This list may not describe all possible side effects. Call your doctor for medical advice about side effects. You may report side effects to FDA at 1-800-FDA-1088. Where should I keep my medicine? Keep out of the reach of children. Store at room temperature or as directed on the package label. Protect from moisture. Do not freeze. Throw away any unused medicine after the expiration date. NOTE: This sheet is a summary. It may not cover all possible information. If  you have questions about this medicine, talk to your doctor, pharmacist, or health care provider.  2021 Elsevier/Gold Standard (2014-04-25 09:36:32)

## 2020-03-14 LAB — HEPATIC FUNCTION PANEL
ALT: 13 IU/L (ref 0–32)
AST: 21 IU/L (ref 0–40)
Albumin: 4.4 g/dL (ref 3.5–4.6)
Alkaline Phosphatase: 93 IU/L (ref 44–121)
Bilirubin Total: 0.4 mg/dL (ref 0.0–1.2)
Bilirubin, Direct: 0.11 mg/dL (ref 0.00–0.40)
Total Protein: 6.5 g/dL (ref 6.0–8.5)

## 2020-03-14 LAB — BASIC METABOLIC PANEL
BUN/Creatinine Ratio: 15 (ref 12–28)
BUN: 20 mg/dL (ref 10–36)
CO2: 23 mmol/L (ref 20–29)
Calcium: 9.1 mg/dL (ref 8.7–10.3)
Chloride: 98 mmol/L (ref 96–106)
Creatinine, Ser: 1.35 mg/dL — ABNORMAL HIGH (ref 0.57–1.00)
GFR calc Af Amer: 38 mL/min/{1.73_m2} — ABNORMAL LOW (ref >59)
GFR calc non Af Amer: 33 mL/min/{1.73_m2} — ABNORMAL LOW (ref >59)
Glucose: 100 mg/dL — ABNORMAL HIGH (ref 65–99)
Potassium: 4.4 mmol/L (ref 3.5–5.2)
Sodium: 136 mmol/L (ref 134–144)

## 2020-03-14 LAB — TSH: TSH: 6.56 u[IU]/mL — ABNORMAL HIGH (ref 0.450–4.500)

## 2020-06-11 ENCOUNTER — Other Ambulatory Visit: Payer: Self-pay | Admitting: Cardiology

## 2020-07-11 ENCOUNTER — Other Ambulatory Visit: Payer: Self-pay | Admitting: Cardiology

## 2020-07-11 NOTE — Telephone Encounter (Signed)
Refill sent to pharmacy.   

## 2020-07-15 ENCOUNTER — Other Ambulatory Visit: Payer: Self-pay

## 2020-07-15 ENCOUNTER — Ambulatory Visit: Payer: Medicare HMO | Admitting: Cardiology

## 2020-07-15 ENCOUNTER — Encounter: Payer: Self-pay | Admitting: Cardiology

## 2020-07-15 ENCOUNTER — Ambulatory Visit (HOSPITAL_BASED_OUTPATIENT_CLINIC_OR_DEPARTMENT_OTHER)
Admission: RE | Admit: 2020-07-15 | Discharge: 2020-07-15 | Disposition: A | Discharge status: Home / Self Care | Payer: Medicare HMO | Source: Ambulatory Visit | Attending: Cardiology | Admitting: Cardiology

## 2020-07-15 VITALS — BP 144/68 | HR 72 | Ht 65.0 in | Wt 133.0 lb

## 2020-07-15 DIAGNOSIS — I2510 Atherosclerotic heart disease of native coronary artery without angina pectoris: Secondary | ICD-10-CM | POA: Diagnosis not present

## 2020-07-15 DIAGNOSIS — I480 Paroxysmal atrial fibrillation: Secondary | ICD-10-CM | POA: Diagnosis not present

## 2020-07-15 DIAGNOSIS — R0602 Shortness of breath: Secondary | ICD-10-CM

## 2020-07-15 DIAGNOSIS — E782 Mixed hyperlipidemia: Secondary | ICD-10-CM | POA: Diagnosis not present

## 2020-07-15 NOTE — Progress Notes (Signed)
Cardiology Office Note:    Date:  07/15/2020   ID:  Sheri Hensley, DOB August 20, 1919, MRN [id number]  PCP:  Sheri Huddle, MD  Cardiologist:  Sheri Lindau, MD   Referring MD: Sheri Huddle, MD    ASSESSMENT:    1. Paroxysmal atrial fibrillation (HCC)   2. Mixed hyperlipidemia   3. Coronary artery disease involving native coronary artery of native heart without angina pectoris    PLAN:    In order of problems listed above:  Primary prevention stressed with the patient.  Importance of compliance with diet medication stressed and she vocalized understanding. Paroxysmal atrial fibrillation:I discussed with the patient atrial fibrillation, disease process. Management and therapy including rate and rhythm control, anticoagulation benefits and potential risks were discussed extensively with the patient. Patient had multiple questions which were answered to patient's satisfaction. Essential hypertension: Blood pressure stable and diet was emphasized. Mixed dyslipidemia: Patient on lipid-lowering therapy and tolerating well.  Triglycerides are elevated and diet was emphasized. Nausea since the past several weeks and has an appointment to see a gastroenterologist.  I will try to get blood work today including LFTs, amylase lipase and amiodarone level also.  I reduced amiodarone to 100 mg every other day.  Questions were answered to her satisfaction.  Benefits and potential risks of amiodarone explained and she vocalized understanding and questions were answered to her satisfaction. Patient will be seen in follow-up appointment in 6 months or earlier if the patient has any concerns    Medication Adjustments/Labs and Tests Ordered: Current medicines are reviewed at length with the patient today.  Concerns regarding medicines are outlined above.  No orders of the defined types were placed in this encounter.  No orders of the defined types were placed in this encounter.    No chief  complaint on file.    History of Present Illness:    Sheri Hensley is a 87 y.o. female.  Patient has past medical history of paroxysmal atrial fibrillation, essential hypertension and mixed dyslipidemia.  She denies any problems at this time and ambulates age appropriately.  Her daughter accompanies her for this visit.  At the time of my evaluation, the patient is alert awake oriented and in no distress.  Her only complaint is of nausea for which the patient has an appointment with gastroenterologist on Friday.  At the time of my evaluation, the patient is alert awake oriented and in no distress.  Past Medical History:  Diagnosis Date   Acquired absence of both cervix and uterus 03/11/2020   Acquired thrombophilia ([place]) 03/11/2020   Acute respiratory failure with hypoxia ([place]) 01/08/2019   Atrial fibrillation ([place])    Bruises easily    Cancer ([place]) 1998   left tamoxifem x 5 yrs   Chronic kidney disease due to hypertension 03/11/2020   Constipation 03/11/2020   Coronary artery disease    CARDIOLOGIST-  DR Geraldo Pitter ([facility])   COVID-19 virus infection 01/08/2019   Elevated bilirubin 12/23/2015   Endometrial cancer ([place])    Endometrial cancer ([place])    Essential (primary) hypertension 10/24/2014   Essential hypertension 10/24/2014   Falls 06/2017   Patient fell and hit her head , patient's daughter's states patient may have fell on the kitchen counter   Frailty 03/11/2020   GERD (gastroesophageal reflux disease)    Glaucoma, both eyes    Herpes zoster without complication 78/29/5621   History of breast cancer    1998--  s/p  left mastectomy with  sln dissection's  ,  no chemo or radiation-  no recurrence   History of Clostridium difficile infection 5-6 yrs ago   Hyperlipidemia    Hypertension    Hyponatremia 12/23/2015   Macular degeneration    Narrow complex tachycardia [facility])    cardiologist-  dr Salley Scarlet   OA (osteoarthritis)    right knee    Overactive bladder 03/11/2020   Paroxysmal atrial fibrillation ([place]) 12/23/2015   Personal history of transient ischemic attack (TIA), and cerebral infarction without residual deficits 12/22/2015   yrs ago ? 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   Standard chest x-ray abnormal 03/11/2020   Thickened endometrium    TIA (transient ischemic attack) 12/22/2015   Type 2 diabetes mellitus with other specified complication ([place]) 03/47/4259   Vitamin D deficiency 03/11/2020   Wears glasses     Past Surgical History:  Procedure Laterality Date   APPENDECTOMY  1954   CATARACT EXTRACTION W/ INTRAOCULAR LENS  IMPLANT, BILATERAL  1994   colonscopy     DILATION AND CURETTAGE OF UTERUS N/A 11/20/2015   Procedure: DILATATION AND CURETTAGE;  Surgeon: Everitt Amber, MD;  Location: [facility];  Service: Gynecology;  Laterality: N/A;   DILATION AND CURETTAGE, DIAGNOSTIC / THERAPEUTIC N/A novmber 2017   per daughter with biopsy as stated   KNEE ARTHROSCOPY Right 2002   MASTECTOMY Left 1998   w/ lympn node dissection's   ROBOTIC ASSISTED TOTAL HYSTERECTOMY WITH BILATERAL SALPINGO OOPHERECTOMY Bilateral 01/27/2016   Procedure: XI ROBOTIC ASSISTED TOTAL HYSTERECTOMY WITH BILATERAL SALPINGO OOPHORECTOMY;  Surgeon: Everitt Amber, MD;  Location: [facility];  Service: Gynecology;  Laterality: Bilateral;    Current Medications: Current Meds  Medication Sig   acetaminophen (TYLENOL) 325 MG tablet Take 2 tablets (650 mg total) by mouth every 6 (six) hours as needed for mild pain or headache (fever >/= 101).   amiodarone (PACERONE) 200 MG  tablet Take 100 mg by mouth daily.   amLODipine (NORVASC) 2.5 MG tablet Take 2.5 mg by mouth daily.   atorvastatin (LIPITOR) 10 MG tablet Take 1 tablet (10 mg total) by mouth at bedtime.   bimatoprost (LUMIGAN) 0.01 % SOLN Place 1 drop into both eyes at bedtime.   brimonidine (ALPHAGAN P) 0.1 % SOLN Place 1 drop into both eyes every 8 (eight) hours.    clopidogrel (PLAVIX) 75 MG tablet Take 1 tablet (75 mg total) by mouth daily.   isosorbide mononitrate (ISMO) 10 MG tablet Take 10 mg by mouth 2 (two) times daily.   loratadine (CLARITIN) 10 MG tablet Take 10 mg by mouth at bedtime.   melatonin 5 MG TABS Take 5 mg by mouth at bedtime.   Multiple Vitamins-Minerals (CENTRUM VITAMINTS PO) Take 1 tablet by mouth every morning.   Multiple Vitamins-Minerals (ICAPS AREDS 2) CAPS Take 1 tablet by mouth 2 (two) times daily.    Omega-3 Fatty Acids (FISH OIL) 1000 MG CAPS Take 2 capsules (2,000 mg total) by mouth in the morning and at bedtime.   omeprazole (PRILOSEC) 20 MG capsule Take 20 mg by mouth daily. Monday through Friday only   Probiotic Product (  PROBIOTIC PO) Take 1 capsule by mouth daily in the afternoon.   sertraline (ZOLOFT) 50 MG tablet Take 50 mg by mouth daily.   vitamin B-12 (CYANOCOBALAMIN) 1000 MCG tablet Take 1,000 mcg by mouth daily.   VITAMIN D, CHOLECALCIFEROL, PO Take 4,000 Units by mouth daily.     Allergies:   Ativan [lorazepam] and Metronidazole   Social History   Socioeconomic History   Marital status: Widowed    Spouse name: Not on file   Number of children: 1   Years of education: Not on file   Highest education level: Not on file  Occupational History   Not on file  Tobacco Use   Smoking status: Never   Smokeless tobacco: Never  Vaping Use   Vaping Use: Never used  Substance and Sexual Activity   Alcohol use: No    Comment: occasional   Drug use: No   Sexual activity: Never  Other Topics Concern   Not on file  Social History Narrative   Not on file    Social Determinants of Health   Financial Resource Strain: Not on file  Food Insecurity: Not on file  Transportation Needs: Not on file  Physical Activity: Not on file  Stress: Not on file  Social Connections: Not on file     Family History: The patient's family history includes Colon cancer in her brother; Hypertension in her father and mother; Liver cancer in her brother.  ROS:   Please see the history of present illness.    All other systems reviewed and are negative.  EKGs/Labs/Other Studies Reviewed:    The following studies were reviewed today: EKG reveals sinus rhythm first-degree AV block and nonspecific ST-T changes   Recent Labs: 03/13/2020: ALT 13; BUN 20; Creatinine, Ser 1.35; Potassium 4.4; Sodium 136; TSH 6.560  Recent Lipid Panel    Component Value Date/Time   CHOL 127 08/10/2016 1615   TRIG 201 (H) 01/08/2019 0358   HDL 32 (L) 08/10/2016 1615   CHOLHDL 4.0 08/10/2016 1615   CHOLHDL 3.2 12/23/2015 0425   VLDL 26 12/23/2015 0425   LDLCALC 31 08/10/2016 1615    Physical Exam:    VS:  BP (!) 144/68   Pulse 72   Ht 5\' 5"  (1.651 m)   Wt 133 lb (60.3 kg)   SpO2 95%   BMI 22.13 kg/m     Wt Readings from Last 3 Encounters:  07/15/20 133 lb (60.3 kg)  03/13/20 134 lb (60.8 kg)  10/29/19 128 lb (58.1 kg)     GEN: Patient is in no acute distress HEENT: Normal NECK: No JVD; No carotid bruits LYMPHATICS: No lymphadenopathy CARDIAC: Hear sounds regular, 2/6 systolic murmur at the apex. RESPIRATORY:  Clear to auscultation without rales, wheezing or rhonchi  ABDOMEN: Soft, non-tender, non-distended MUSCULOSKELETAL:  No edema; No deformity  SKIN: Warm and dry NEUROLOGIC:  Alert and oriented x 3 PSYCHIATRIC:  Normal affect   Signed, Sheri Lindau, MD  07/15/2020 2:39 PM    [facility]

## 2020-07-15 NOTE — Patient Instructions (Addendum)
Medication Instructions:  Your physician has recommended you make the following change in your medication:   Decrease your Amiodarone to 1/2 (100 mg) every other day.  *If you need a refill on your cardiac medications before your next appointment, please call your pharmacy*   Lab Work: Your physician recommends that you have LFT's, amylase, lipase and BNP.  If you have labs (blood work) drawn today and your tests are completely normal, you will receive your results only by: [place] (if you have MyChart) OR A paper copy in the mail If you have any lab test that is abnormal or we need to change your treatment, we will call you to review the results.   Testing/Procedures: A chest x-ray takes a picture of the organs and structures inside the chest, including the heart, lungs, and blood vessels. This test can show several things, including, whether the heart is enlarges; whether fluid is building up in the lungs; and whether pacemaker / defibrillator leads are still in place.    Follow-Up: At [facility], you and your health needs are our priority.  As part of our continuing mission to provide you with exceptional heart care, we have created designated Provider Care Teams.  These Care Teams include your primary Cardiologist (physician) and Advanced Practice Providers (APPs -  Physician Assistants and Nurse Practitioners) who all work together to provide you with the care you need, when you need it.  We recommend signing up for the patient portal called "MyChart".  Sign up information is provided on this After Visit Summary.  MyChart is used to connect with patients for Virtual Visits (Telemedicine).  Patients are able to view lab/test results, encounter notes, upcoming appointments, etc.  Non-urgent messages can be sent to your provider as well.   To learn more about what you can do with MyChart, go to [email or web address].    Your next appointment:   6 month(s)  The format for  your next appointment:   In Person  Provider:   Jyl Heinz, MD   Other Instructions NA

## 2020-07-16 LAB — HEPATIC FUNCTION PANEL
ALT: 17 IU/L (ref 0–32)
AST: 19 IU/L (ref 0–40)
Albumin: 4.6 g/dL (ref 3.5–4.6)
Alkaline Phosphatase: 90 IU/L (ref 44–121)
Bilirubin Total: 0.8 mg/dL (ref 0.0–1.2)
Bilirubin, Direct: 0.2 mg/dL (ref 0.00–0.40)
Total Protein: 6.5 g/dL (ref 6.0–8.5)

## 2020-07-16 LAB — AMYLASE: Amylase: 102 U/L (ref 31–110)

## 2020-07-16 LAB — BRAIN NATRIURETIC PEPTIDE: BNP: 91.3 pg/mL (ref 0.0–100.0)

## 2020-07-16 LAB — LIPASE: Lipase: 35 U/L (ref 14–85)

## 2020-07-17 ENCOUNTER — Telehealth: Payer: Self-pay

## 2020-07-17 NOTE — Telephone Encounter (Signed)
Recommendations and results reviewed with Sheri Hensley per DPR  as per Dr. Julien Nordmann note.  Sheri Hensley verbalized understanding and had no additional questions.

## 2020-07-17 NOTE — Telephone Encounter (Signed)
Vickie Apple (daughter) called to clarify that pt has been taking Amiodarone 100 mg every other day since her October appointment where it originally changed. Vickie wanted to make sure that we did not need to further decrease the dose as the change that we made 6/22 was how pt was already taking.

## 2020-08-08 ENCOUNTER — Other Ambulatory Visit: Payer: Self-pay | Admitting: Gastroenterology

## 2020-08-08 DIAGNOSIS — R112 Nausea with vomiting, unspecified: Secondary | ICD-10-CM

## 2020-08-11 ENCOUNTER — Ambulatory Visit
Admission: RE | Admit: 2020-08-11 | Discharge: 2020-08-11 | Disposition: A | Discharge status: Home / Self Care | Payer: Medicare HMO | Source: Ambulatory Visit | Attending: Gastroenterology | Admitting: Gastroenterology

## 2020-08-11 ENCOUNTER — Other Ambulatory Visit: Payer: Self-pay | Admitting: Gastroenterology

## 2020-08-11 DIAGNOSIS — K5900 Constipation, unspecified: Secondary | ICD-10-CM

## 2020-08-13 ENCOUNTER — Ambulatory Visit
Admission: RE | Admit: 2020-08-13 | Discharge: 2020-08-13 | Disposition: A | Discharge status: Home / Self Care | Payer: Medicare HMO | Source: Ambulatory Visit | Attending: Gastroenterology | Admitting: Gastroenterology

## 2020-08-13 DIAGNOSIS — R112 Nausea with vomiting, unspecified: Secondary | ICD-10-CM

## 2020-08-22 ENCOUNTER — Other Ambulatory Visit: Payer: Self-pay | Admitting: Gastroenterology

## 2020-08-22 DIAGNOSIS — R933 Abnormal findings on diagnostic imaging of other parts of digestive tract: Secondary | ICD-10-CM

## 2020-08-28 ENCOUNTER — Emergency Department (HOSPITAL_COMMUNITY): Payer: Medicare HMO

## 2020-08-28 ENCOUNTER — Inpatient Hospital Stay (HOSPITAL_COMMUNITY)
Admission: EM | Admit: 2020-08-28 | Discharge: 2020-09-05 | DRG: 522 | Disposition: A | Discharge status: Home / Self Care | Payer: Medicare HMO | Attending: Internal Medicine | Admitting: Internal Medicine

## 2020-08-28 DIAGNOSIS — N179 Acute kidney failure, unspecified: Secondary | ICD-10-CM

## 2020-08-28 DIAGNOSIS — S72001A Fracture of unspecified part of neck of right femur, initial encounter for closed fracture: Secondary | ICD-10-CM

## 2020-08-28 DIAGNOSIS — S0003XA Contusion of scalp, initial encounter: Secondary | ICD-10-CM

## 2020-08-28 DIAGNOSIS — Z419 Encounter for procedure for purposes other than remedying health state, unspecified: Secondary | ICD-10-CM

## 2020-08-28 DIAGNOSIS — I480 Paroxysmal atrial fibrillation: Secondary | ICD-10-CM

## 2020-08-28 DIAGNOSIS — E871 Hypo-osmolality and hyponatremia: Secondary | ICD-10-CM

## 2020-08-28 DIAGNOSIS — W19XXXA Unspecified fall, initial encounter: Secondary | ICD-10-CM

## 2020-08-28 DIAGNOSIS — E876 Hypokalemia: Secondary | ICD-10-CM | POA: Diagnosis not present

## 2020-08-28 DIAGNOSIS — Y92009 Unspecified place in unspecified non-institutional (private) residence as the place of occurrence of the external cause: Secondary | ICD-10-CM

## 2020-08-28 DIAGNOSIS — N1832 Chronic kidney disease, stage 3b: Secondary | ICD-10-CM | POA: Diagnosis present

## 2020-08-28 DIAGNOSIS — D696 Thrombocytopenia, unspecified: Secondary | ICD-10-CM | POA: Diagnosis not present

## 2020-08-28 DIAGNOSIS — F419 Anxiety disorder, unspecified: Secondary | ICD-10-CM | POA: Diagnosis present

## 2020-08-28 DIAGNOSIS — Z79899 Other long term (current) drug therapy: Secondary | ICD-10-CM

## 2020-08-28 DIAGNOSIS — Z20822 Contact with and (suspected) exposure to covid-19: Secondary | ICD-10-CM | POA: Diagnosis present

## 2020-08-28 DIAGNOSIS — F05 Delirium due to known physiological condition: Secondary | ICD-10-CM | POA: Diagnosis not present

## 2020-08-28 DIAGNOSIS — K219 Gastro-esophageal reflux disease without esophagitis: Secondary | ICD-10-CM | POA: Diagnosis present

## 2020-08-28 DIAGNOSIS — R197 Diarrhea, unspecified: Secondary | ICD-10-CM | POA: Diagnosis not present

## 2020-08-28 DIAGNOSIS — Z751 Person awaiting admission to adequate facility elsewhere: Secondary | ICD-10-CM

## 2020-08-28 DIAGNOSIS — Z7902 Long term (current) use of antithrombotics/antiplatelets: Secondary | ICD-10-CM | POA: Diagnosis not present

## 2020-08-28 DIAGNOSIS — K580 Irritable bowel syndrome with diarrhea: Secondary | ICD-10-CM | POA: Diagnosis present

## 2020-08-28 DIAGNOSIS — E872 Acidosis: Secondary | ICD-10-CM | POA: Diagnosis not present

## 2020-08-28 DIAGNOSIS — Z888 Allergy status to other drugs, medicaments and biological substances status: Secondary | ICD-10-CM

## 2020-08-28 DIAGNOSIS — F32A Depression, unspecified: Secondary | ICD-10-CM | POA: Diagnosis present

## 2020-08-28 DIAGNOSIS — S72011A Unspecified intracapsular fracture of right femur, initial encounter for closed fracture: Secondary | ICD-10-CM | POA: Diagnosis present

## 2020-08-28 DIAGNOSIS — D62 Acute posthemorrhagic anemia: Secondary | ICD-10-CM | POA: Diagnosis not present

## 2020-08-28 DIAGNOSIS — I129 Hypertensive chronic kidney disease with stage 1 through stage 4 chronic kidney disease, or unspecified chronic kidney disease: Secondary | ICD-10-CM | POA: Diagnosis present

## 2020-08-28 DIAGNOSIS — K582 Mixed irritable bowel syndrome: Secondary | ICD-10-CM | POA: Diagnosis not present

## 2020-08-28 DIAGNOSIS — R410 Disorientation, unspecified: Secondary | ICD-10-CM | POA: Diagnosis not present

## 2020-08-28 DIAGNOSIS — R17 Unspecified jaundice: Secondary | ICD-10-CM | POA: Diagnosis present

## 2020-08-28 DIAGNOSIS — W010XXA Fall on same level from slipping, tripping and stumbling without subsequent striking against object, initial encounter: Secondary | ICD-10-CM | POA: Diagnosis present

## 2020-08-28 DIAGNOSIS — M25551 Pain in right hip: Secondary | ICD-10-CM | POA: Diagnosis present

## 2020-08-28 DIAGNOSIS — E785 Hyperlipidemia, unspecified: Secondary | ICD-10-CM | POA: Diagnosis present

## 2020-08-28 HISTORY — DX: Unspecified atrial fibrillation: I48.91

## 2020-08-28 HISTORY — DX: Fracture of unspecified part of neck of right femur, initial encounter for closed fracture: S72.001A

## 2020-08-28 LAB — CBC
HCT: 33.4 % — ABNORMAL LOW (ref 36.0–46.0)
Hemoglobin: 10.9 g/dL — ABNORMAL LOW (ref 12.0–15.0)
MCH: 29.1 pg (ref 26.0–34.0)
MCHC: 32.6 g/dL (ref 30.0–36.0)
MCV: 89.1 fL (ref 80.0–100.0)
Platelets: 130 10*3/uL — ABNORMAL LOW (ref 150–400)
RBC: 3.75 MIL/uL — ABNORMAL LOW (ref 3.87–5.11)
RDW: 14.5 % (ref 11.5–15.5)
WBC: 7.7 10*3/uL (ref 4.0–10.5)
nRBC: 0 % (ref 0.0–0.2)

## 2020-08-28 LAB — I-STAT CHEM 8, ED
BUN: 18 mg/dL (ref 8–23)
Calcium, Ion: 1.04 mmol/L — ABNORMAL LOW (ref 1.15–1.40)
Chloride: 95 mmol/L — ABNORMAL LOW (ref 98–111)
Creatinine, Ser: 1.3 mg/dL — ABNORMAL HIGH (ref 0.44–1.00)
Glucose, Bld: 148 mg/dL — ABNORMAL HIGH (ref 70–99)
HCT: 32 % — ABNORMAL LOW (ref 36.0–46.0)
Hemoglobin: 10.9 g/dL — ABNORMAL LOW (ref 12.0–15.0)
Potassium: 3.8 mmol/L (ref 3.5–5.1)
Sodium: 130 mmol/L — ABNORMAL LOW (ref 135–145)
TCO2: 25 mmol/L (ref 22–32)

## 2020-08-28 LAB — PROTIME-INR
INR: 1.1 (ref 0.8–1.2)
Prothrombin Time: 14.4 seconds (ref 11.4–15.2)

## 2020-08-28 LAB — COMPREHENSIVE METABOLIC PANEL
ALT: 19 U/L (ref 0–44)
AST: 29 U/L (ref 15–41)
Albumin: 4 g/dL (ref 3.5–5.0)
Alkaline Phosphatase: 78 U/L (ref 38–126)
Anion gap: 12 (ref 5–15)
BUN: 17 mg/dL (ref 8–23)
CO2: 24 mmol/L (ref 22–32)
Calcium: 9 mg/dL (ref 8.9–10.3)
Chloride: 93 mmol/L — ABNORMAL LOW (ref 98–111)
Creatinine, Ser: 1.32 mg/dL — ABNORMAL HIGH (ref 0.44–1.00)
GFR, Estimated: 36 mL/min — ABNORMAL LOW (ref >60)
Glucose, Bld: 149 mg/dL — ABNORMAL HIGH (ref 70–99)
Potassium: 3.8 mmol/L (ref 3.5–5.1)
Sodium: 129 mmol/L — ABNORMAL LOW (ref 135–145)
Total Bilirubin: 1.1 mg/dL (ref 0.3–1.2)
Total Protein: 6.2 g/dL — ABNORMAL LOW (ref 6.5–8.1)

## 2020-08-28 LAB — LACTIC ACID, PLASMA: Lactic Acid, Venous: 2.2 mmol/L (ref 0.5–1.9)

## 2020-08-28 LAB — ETHANOL: Alcohol, Ethyl (B): <10 mg/dL (ref <10)

## 2020-08-28 MED ORDER — FENTANYL CITRATE PF 50 MCG/ML IJ SOSY
25.0000 ug | PREFILLED_SYRINGE | Freq: Once | INTRAMUSCULAR | Status: AC
Start: 2020-08-28 — End: 2020-08-28
  Administered 2020-08-28: 25 ug via INTRAVENOUS
  Filled 2020-08-28: qty 1

## 2020-08-28 NOTE — ED Notes (Signed)
Trauma Response Nurse Note-  Reason for Call / Reason for Trauma activation:   -Level 2 trauma, Fall with head injury and right hip injury   Initial Focused Assessment (If applicable, or please see trauma documentation):  -Pt sitting up and talking, airway patent. Symmetrical chest rise and fall noted. No external bleeding noted.   Interventions:  - C-collar applied. X-ray or pelvis/right hip and chest obtained. CT head and C-spine completed. Blood work obtained. Family is at  Bedside.   Plan of Care as of this note:  -Waiting on imaging.  Event Summary:   -Pt came in as a level 2 trauma, fall of plavix with right hip pain. Pt states hitting hear head, but only complaining only of right hip pain. Bedside x-rays obtained along with blood work. Pt taken to CT on monitor with TRN. TRN transported pt back to room and family at bedside.

## 2020-08-28 NOTE — ED Triage Notes (Signed)
Per EMS, pt from home w/ family was getting up walking from back door.  She fell on her right side, hitting her head on the hardwood floor leaving a hematoma on the back side of her head.  C/O right hip pain, which is shortened and rotated.  No LOC, n/v or dizziness.  GCS 15. Pt is on plavix.    184/64 HR 80 CBG 142

## 2020-08-28 NOTE — ED Notes (Signed)
Pt taken to CT at this time.

## 2020-08-28 NOTE — ED Notes (Signed)
Xrays done, Pt to CT

## 2020-08-28 NOTE — Progress Notes (Signed)
   08/28/20 2100  Clinical Encounter Type  Visited With Patient not available  Visit Type Trauma  Referral From Nurse  Consult/Referral To Chaplain  The chaplain responded to Trauma 2 page. The patient is being assessed. There is no family present currently. No chaplain services are needed at this time. The chaplain will follow up as needed

## 2020-08-28 NOTE — ED Notes (Signed)
C-collar placed once she arrived.

## 2020-08-28 NOTE — ED Notes (Signed)
Notified provider of critical lactic

## 2020-08-28 NOTE — ED Provider Notes (Signed)
[facility] EMERGENCY DEPARTMENT Provider Note   CSN: [id number] Arrival date & time: 08/28/20  2152     History Chief Complaint  Patient presents with   Trauma   Fall    Sheri Hensley is a 87 y.o. female.  HPI 87 year old female history of paroxysmal A. fib, on Plavix, with fall tonight.  She is complaining of right hip pain.  She had struck her head and was in the cervical collar that was removed prior to getting to the ED.  EMS reports that her oxygen saturations were 98% but she had been placed on oxygen by fire department.she complains of some pain to the right side of her head and right hip pain.     No past medical history on file.  There are no problems to display for this patient.    OB History   No obstetric history on file.     No family history on file.     Home Medications Prior to Admission medications   Not on File    Allergies    Patient has no allergy information on record.  Review of Systems   Review of Systems  Unable to perform ROS: Acuity of condition   Physical Exam Updated Vital Signs BP (!) 190/77   Pulse 89   Temp 97.7 F (36.5 C) (Temporal)   Resp (!) 21   Ht 1.6 m ('5\' 3"'$ )   Wt 60.3 kg   SpO2 90%   BMI 23.56 kg/m   Physical Exam Vitals and nursing note reviewed.  HENT:     Head:     Comments: Contusion to right head.    Right Ear: External ear normal.     Left Ear: External ear normal.     Nose: Nose normal.     Mouth/Throat:     Pharynx: Oropharynx is clear.  Eyes:     Pupils: Pupils are equal, round, and reactive to light.  Neck:     Comments: No cervical spine tenderness Collar applied in ED Trachea midline Cardiovascular:     Rate and Rhythm: Normal rate. Rhythm irregular.  Pulmonary:     Effort: Pulmonary effort is normal.     Breath sounds: Normal breath sounds.  Abdominal:     General: Bowel sounds are normal.     Palpations: Abdomen is soft.  Musculoskeletal:         General: Tenderness present.     Comments: No obvious signs of external trauma on back No tenderness palpation over cervical, thoracic, or lumbar spine Tenderness over right hip with some shortening of the right leg  Skin:    General: Skin is warm and dry.     Capillary Refill: Capillary refill takes less than 2 seconds.  Neurological:     General: No focal deficit present.     Mental Status: She is alert.  Psychiatric:        Mood and Affect: Mood normal.    ED Results / Procedures / Treatments   Labs (all labs ordered are listed, but only abnormal results are displayed) Labs Reviewed  CBC - Abnormal; Notable for the following components:      Result Value   RBC 3.75 (*)    Hemoglobin 10.9 (*)    HCT 33.4 (*)    Platelets 130 (*)    All other components within normal limits  I-STAT CHEM 8, ED - Abnormal; Notable for the following components:   Sodium 130 (*)  Chloride 95 (*)    Creatinine, Ser 1.30 (*)    Glucose, Bld 148 (*)    Calcium, Ion 1.04 (*)    Hemoglobin 10.9 (*)    HCT 32.0 (*)    All other components within normal limits  RESP PANEL BY RT-PCR (FLU A&B, COVID) ARPGX2  PROTIME-INR  COMPREHENSIVE METABOLIC PANEL  ETHANOL  URINALYSIS, ROUTINE W REFLEX MICROSCOPIC  LACTIC ACID, PLASMA  SAMPLE TO BLOOD BANK    EKG EKG Interpretation  Date/Time:  Friday August 29 2020 01:22:59 EDT Ventricular Rate:  95 PR Interval:    QRS Duration: 101 QT Interval:  373 QTC Calculation: 469 R Axis:   -2 Text Interpretation: Sinus rhythm Borderline repolarization abnormality No old tracing to compare Confirmed by Delora Fuel ([id number]) on 08/29/2020 5:07:39 AM  Radiology DG Chest Port 1 View  Result Date: 08/28/2020 CLINICAL DATA:  Fall EXAM: PORTABLE CHEST 1 VIEW COMPARISON:  07/15/2020 FINDINGS: Mild cardiomegaly. No confluent airspace disease or effusion. Biapical scarring. No pneumothorax. No acute bony abnormality. IMPRESSION: No active disease. Electronically Signed    By: Rolm Baptise M.D.   On: 08/28/2020 22:20   DG Hip Unilat W or Wo Pelvis 2-3 Views Right  Result Date: 08/28/2020 CLINICAL DATA:  Fall EXAM: DG HIP (WITH OR WITHOUT PELVIS) 2-3V RIGHT COMPARISON:  None. FINDINGS: There is a right femoral neck fracture with varus angulation. No subluxation or dislocation. Mild degenerative changes in the hips. IMPRESSION: Right femoral neck fracture with varus angulation. Electronically Signed   By: Rolm Baptise M.D.   On: 08/28/2020 22:20    Procedures .Critical Care  Date/Time: 08/28/2020 10:59 PM Performed by: Pattricia Boss, MD Authorized by: Pattricia Boss, MD   Critical care provider statement:    Critical care time (minutes):  45   Critical care end time:  08/28/2020 10:59 PM   Critical care was time spent personally by me on the following activities:  Discussions with consultants, evaluation of patient's response to treatment, examination of patient, ordering and performing treatments and interventions, ordering and review of laboratory studies, ordering and review of radiographic studies, pulse oximetry, re-evaluation of patient's condition, obtaining history from patient or surrogate and review of old charts   Medications Ordered in ED Medications  fentaNYL (SUBLIMAZE) injection 25 mcg (has no administration in time range)    ED Course  I have reviewed the triage vital signs and the nursing notes.  Pertinent labs & imaging results that were available during my care of the patient were reviewed by me and considered in my medical decision making (see chart for details).    MDM Rules/Calculators/A&P                           Right hip fracture- Discussed with Dr. Percell Miller, on-call for orthopedic surgery.  He request that patient stay n.p.o. for now. Patient with mechanical call fall with head hematoma, no evidence of ich or acute neck trauma  Final Clinical Impression(s) / ED Diagnoses Final diagnoses:  Fall    Rx / DC Orders ED  Discharge Orders     None        Pattricia Boss, MD 09/02/20 1836

## 2020-08-28 NOTE — ED Notes (Addendum)
Provider bedside updating family and pt.  Pt placed on 2L [place] due to O2 dropping to 88% after medication

## 2020-08-29 ENCOUNTER — Other Ambulatory Visit: Payer: Self-pay

## 2020-08-29 ENCOUNTER — Inpatient Hospital Stay (HOSPITAL_COMMUNITY): Payer: Medicare HMO

## 2020-08-29 ENCOUNTER — Encounter (HOSPITAL_COMMUNITY): Payer: Self-pay | Admitting: Family Medicine

## 2020-08-29 DIAGNOSIS — I480 Paroxysmal atrial fibrillation: Secondary | ICD-10-CM

## 2020-08-29 DIAGNOSIS — N179 Acute kidney failure, unspecified: Secondary | ICD-10-CM

## 2020-08-29 DIAGNOSIS — E871 Hypo-osmolality and hyponatremia: Secondary | ICD-10-CM

## 2020-08-29 DIAGNOSIS — S72001A Fracture of unspecified part of neck of right femur, initial encounter for closed fracture: Secondary | ICD-10-CM

## 2020-08-29 HISTORY — DX: Acute kidney failure, unspecified: N17.9

## 2020-08-29 HISTORY — DX: Paroxysmal atrial fibrillation: I48.0

## 2020-08-29 LAB — RESP PANEL BY RT-PCR (FLU A&B, COVID) ARPGX2
Influenza A by PCR: NEGATIVE
Influenza B by PCR: NEGATIVE
SARS Coronavirus 2 by RT PCR: NEGATIVE

## 2020-08-29 LAB — BASIC METABOLIC PANEL
Anion gap: 12 (ref 5–15)
BUN: 16 mg/dL (ref 8–23)
CO2: 23 mmol/L (ref 22–32)
Calcium: 9.6 mg/dL (ref 8.9–10.3)
Chloride: 95 mmol/L — ABNORMAL LOW (ref 98–111)
Creatinine, Ser: 1.25 mg/dL — ABNORMAL HIGH (ref 0.44–1.00)
GFR, Estimated: 39 mL/min — ABNORMAL LOW (ref >60)
Glucose, Bld: 112 mg/dL — ABNORMAL HIGH (ref 70–99)
Potassium: 4 mmol/L (ref 3.5–5.1)
Sodium: 130 mmol/L — ABNORMAL LOW (ref 135–145)

## 2020-08-29 LAB — URINALYSIS, ROUTINE W REFLEX MICROSCOPIC
Bilirubin Urine: NEGATIVE
Glucose, UA: NEGATIVE mg/dL
Hgb urine dipstick: NEGATIVE
Ketones, ur: NEGATIVE mg/dL
Leukocytes,Ua: NEGATIVE
Nitrite: NEGATIVE
Protein, ur: NEGATIVE mg/dL
Specific Gravity, Urine: 1.01 (ref 1.005–1.030)
pH: 8 (ref 5.0–8.0)

## 2020-08-29 MED ORDER — TRAMADOL HCL 50 MG PO TABS
50.0000 mg | ORAL_TABLET | Freq: Once | ORAL | Status: AC
  Administered 2020-08-30: 50 mg via ORAL
  Filled 2020-08-29: qty 1

## 2020-08-29 MED ORDER — HYDROMORPHONE HCL 1 MG/ML IJ SOLN
0.5000 mg | INTRAMUSCULAR | Status: DC | PRN
  Administered 2020-08-29 – 2020-08-31 (×11): 0.5 mg via INTRAVENOUS
  Filled 2020-08-29: qty 0.5
  Filled 2020-08-29 (×3): qty 1
  Filled 2020-08-29 (×4): qty 0.5
  Filled 2020-08-29 (×3): qty 1

## 2020-08-29 MED ORDER — BRIMONIDINE TARTRATE 0.15 % OP SOLN
1.0000 [drp] | Freq: Two times a day (BID) | OPHTHALMIC | Status: DC
  Administered 2020-08-29 – 2020-09-05 (×13): 1 [drp] via OPHTHALMIC
  Filled 2020-08-29: qty 5

## 2020-08-29 MED ORDER — ACETAMINOPHEN 650 MG RE SUPP: 650.0000 mg | Freq: Four times a day (QID) | RECTAL | Status: DC | PRN

## 2020-08-29 MED ORDER — LATANOPROST 0.005 % OP SOLN
1.0000 [drp] | Freq: Every day | OPHTHALMIC | Status: DC
Start: 2020-08-29 — End: 2020-09-05
  Administered 2020-08-29 – 2020-09-04 (×7): 1 [drp] via OPHTHALMIC
  Filled 2020-08-29: qty 2.5

## 2020-08-29 MED ORDER — ISOSORBIDE MONONITRATE 20 MG PO TABS
10.0000 mg | ORAL_TABLET | Freq: Two times a day (BID) | ORAL | Status: DC
  Administered 2020-08-29 – 2020-09-05 (×13): 10 mg via ORAL
  Filled 2020-08-29 (×16): qty 1

## 2020-08-29 MED ORDER — VITAMIN B-12 1000 MCG PO TABS
1000.0000 ug | ORAL_TABLET | Freq: Every day | ORAL | Status: DC
  Administered 2020-08-29 – 2020-09-05 (×7): 1000 ug via ORAL
  Filled 2020-08-29 (×7): qty 1

## 2020-08-29 MED ORDER — AMIODARONE HCL 200 MG PO TABS
100.0000 mg | ORAL_TABLET | ORAL | Status: DC
  Administered 2020-08-29 – 2020-09-04 (×4): 100 mg via ORAL
  Filled 2020-08-29 (×5): qty 1

## 2020-08-29 MED ORDER — POVIDONE-IODINE 10 % EX SWAB: 2.0000 "application " | Freq: Once | CUTANEOUS | Status: DC

## 2020-08-29 MED ORDER — MUPIROCIN 2 % EX OINT
1.0000 "application " | TOPICAL_OINTMENT | Freq: Two times a day (BID) | CUTANEOUS | Status: AC
  Administered 2020-09-02 – 2020-09-03 (×3): 1 via NASAL
  Filled 2020-08-29: qty 22

## 2020-08-29 MED ORDER — CEFAZOLIN SODIUM-DEXTROSE 2-4 GM/100ML-% IV SOLN
2.0000 g | INTRAVENOUS | Status: AC
  Administered 2020-08-30: 2 g via INTRAVENOUS
  Filled 2020-08-29 (×2): qty 100

## 2020-08-29 MED ORDER — ENOXAPARIN SODIUM 30 MG/0.3ML IJ SOSY
30.0000 mg | PREFILLED_SYRINGE | Freq: Once | INTRAMUSCULAR | Status: AC
  Administered 2020-08-29: 30 mg via SUBCUTANEOUS
  Filled 2020-08-29: qty 0.3

## 2020-08-29 MED ORDER — CHLORHEXIDINE GLUCONATE 4 % EX LIQD
60.0000 mL | Freq: Once | CUTANEOUS | Status: AC
  Administered 2020-08-30: 4 via TOPICAL
  Filled 2020-08-29: qty 60

## 2020-08-29 MED ORDER — PANTOPRAZOLE SODIUM 40 MG PO TBEC
40.0000 mg | DELAYED_RELEASE_TABLET | Freq: Every morning | ORAL | Status: DC
  Administered 2020-08-29 – 2020-09-05 (×7): 40 mg via ORAL
  Filled 2020-08-29 (×7): qty 1

## 2020-08-29 MED ORDER — ACETAMINOPHEN 325 MG PO TABS
650.0000 mg | ORAL_TABLET | Freq: Four times a day (QID) | ORAL | Status: DC | PRN
  Administered 2020-08-29: 650 mg via ORAL
  Filled 2020-08-29: qty 2

## 2020-08-29 MED ORDER — ATORVASTATIN CALCIUM 10 MG PO TABS
10.0000 mg | ORAL_TABLET | Freq: Every day | ORAL | Status: DC
  Administered 2020-08-29 – 2020-09-05 (×7): 10 mg via ORAL
  Filled 2020-08-29 (×7): qty 1

## 2020-08-29 MED ORDER — SERTRALINE HCL 50 MG PO TABS
50.0000 mg | ORAL_TABLET | Freq: Every day | ORAL | Status: DC
  Administered 2020-08-29 – 2020-09-05 (×7): 50 mg via ORAL
  Filled 2020-08-29 (×7): qty 1

## 2020-08-29 MED ORDER — TRANEXAMIC ACID-NACL 1000-0.7 MG/100ML-% IV SOLN
1000.0000 mg | INTRAVENOUS | Status: AC
  Administered 2020-08-30: 1000 mg via INTRAVENOUS
  Filled 2020-08-29: qty 100

## 2020-08-29 MED ORDER — VITAMIN D 25 MCG (1000 UNIT) PO TABS
2000.0000 [IU] | ORAL_TABLET | Freq: Every day | ORAL | Status: DC
  Administered 2020-08-29: 2000 [IU] via ORAL
  Filled 2020-08-29: qty 2

## 2020-08-29 MED ORDER — SUCRALFATE 1 G PO TABS
1.0000 g | ORAL_TABLET | Freq: Two times a day (BID) | ORAL | Status: DC
  Administered 2020-08-29 – 2020-09-05 (×14): 1 g via ORAL
  Filled 2020-08-29 (×16): qty 1

## 2020-08-29 MED ORDER — LACTATED RINGERS IV SOLN: INTRAVENOUS | Status: DC

## 2020-08-29 MED ORDER — HYDROMORPHONE HCL 1 MG/ML IJ SOLN
1.0000 mg | Freq: Once | INTRAMUSCULAR | Status: AC
Start: 2020-08-29 — End: 2020-08-29
  Administered 2020-08-29: 0.5 mg via INTRAVENOUS
  Filled 2020-08-29: qty 1

## 2020-08-29 NOTE — H&P (Signed)
History and Physical    Sheri Hensley [id number] DOB: [birth year] DOA: 08/28/2020  PCP: Josetta Huddle, MD   Patient coming from: Home  Chief Complaint: Fall at home, pain in right hip  HPI: Sheri Hensley is a 87 y.o. female with medical history significant for PAF on plavix who presents by EMS after a fall at home.  She states that she was getting up to go lock her back door before she went to bed as she normally does.  She lost her balance and fell landing on her right side.  She reports pain in her right hip and was not able to stand up.  She also hit the back of the right side of her head on the floor but did not lose consciousness.  She had her cell phone with her and called 911.  EMS placed her in a c-collar and brought her to the emergency room.  She had no hypoxia and no complaints of chest pain.  She reports she has a mild headache in the back of her head but has no visual change, drooping face or slurred speech.  She denies any numbness of her extremities or face.  Daughter is at bedside and states she is speaking normally.  States that she does take Plavix and amiodarone for paroxysmal atrial fibrillation.  She lives alone and is very independent.  She does have some body stay with her during the day to help around the house and her daughter lives next door and sees her daily. Denies tobacco, alcohol, illicit drug use  ED Course: In the emergency room she been hemodynamically stable and found to have a right closed hip fracture.  Orthopedics has been consulted and asked the patient be kept n.p.o. after midnight and they will evaluate patient in the morning to discuss surgical definitive repair of hip fracture.  ET of the head and neck showed no acute intracranial pathology or injury to the neck.  She does have a hematoma on the posterior right scalp but no fracture of the cranium.  Was mildly decreased at 130, potassium 3.8, chloride 95, bicarb 24, creatinine 1.32, BUN 17,  LFTs normal, lactic acid 2.2, WBC 7700, hemoglobin 10.9, hematocrit 33.4, platelets 130,000, INR 1.1.  Hospitalist service was asked to admit for further management  Review of Systems:  General: Denies weakness, fever, chills, weight loss, night sweats.  Denies dizziness.  Denies change in appetite HENT: Reports headache and head trauma, denies change in hearing, tinnitus.  Denies nasal congestion or bleeding.  Denies sore throat.  Denies difficulty swallowing Eyes: Denies blurry vision, pain in eye, drainage.  Denies discoloration of eyes. Neck: Denies pain.  Denies swelling.  Denies pain with movement. Cardiovascular: Denies chest pain, palpitations.  Denies edema.  Denies orthopnea Respiratory: Denies shortness of breath, cough.  Denies wheezing.  Denies sputum production Gastrointestinal: Denies abdominal pain, swelling.  Denies nausea, vomiting, diarrhea.  Denies melena.  Denies hematemesis. Musculoskeletal: Reports pain in right hip and  limitation of movement of right lower extremity.  Denies swelling.  Genitourinary: Denies pelvic pain.  Denies urinary frequency or hesitancy.  Denies dysuria.  Skin: Denies rash.  Denies petechiae, purpura, ecchymosis. Neurological: Denies syncope.  Denies seizure activity.  Denies paresthesia.  Denies slurred speech, drooping face.  Denies visual change. Psychiatric: Denies depression, anxiety.  Denies hallucinations.  Past Medical History:  Diagnosis Date   Atrial fibrillation [facility])     History reviewed. No pertinent surgical history.  Social History  reports  that she has never smoked. She has never used smokeless tobacco. She reports that she does not drink alcohol and does not use drugs.  Not on File  History reviewed. No pertinent family history.   Prior to Admission medications   Not on File    Physical Exam: Vitals:   08/28/20 2230 08/28/20 2245 08/28/20 2300 08/28/20 2315  BP: (!) 180/56 (!) 180/101    Pulse: 84 84 87 87  Resp:  18 (!) 21 19 (!) 24  Temp:      TempSrc:      SpO2: 90% 96% 96% 94%  Weight:      Height:        Constitutional: NAD, calm, comfortable Vitals:   08/28/20 2230 08/28/20 2245 08/28/20 2300 08/28/20 2315  BP: (!) 180/56 (!) 180/101    Pulse: 84 84 87 87  Resp: 18 (!) 21 19 (!) 24  Temp:      TempSrc:      SpO2: 90% 96% 96% 94%  Weight:      Height:       General: WDWN, Alert and oriented x3.  Eyes: EOMI, PERRL, conjunctivae normal.  Sclera nonicteric HENT:  [place]/AT, external ears normal.  Nares patent without epistasis.  Mucous membranes are dry. Posterior pharynx clear  Neck: Soft, normal range of motion, supple, no masses.  Trachea midline Respiratory: clear to auscultation bilaterally, no wheezing, no crackles. Normal respiratory effort. No accessory muscle use.  Cardiovascular: Regular rate and rhythm, no murmurs / rubs / gallops. No extremity edema. 2+ pedal pulses.  Abdomen: Soft, no tenderness, nondistended, no rebound or guarding.  No masses palpated. Bowel sounds normoactive Musculoskeletal:  LROM right hip and leg due to pain. no cyanosis. No joint deformity upper and lower extremities. Normal muscle tone.  Skin: Warm, dry, intact no rashes, lesions, ulcers. No induration Neurologic: CN 2-12 grossly intact.  Normal speech.  Sensation intact to touch. Strength 4/5 in upper extremities and left lower extremity.   Psychiatric: Normal judgment and insight.  Normal mood.    Labs on Admission: I have personally reviewed following labs and imaging studies  CBC: Recent Labs  Lab 08/28/20 2209 08/28/20 2213  WBC 7.7  --   HGB 10.9* 10.9*  HCT 33.4* 32.0*  MCV 89.1  --   PLT 130*  --     Basic Metabolic Panel: Recent Labs  Lab 08/28/20 2209 08/28/20 2213  NA 129* 130*  K 3.8 3.8  CL 93* 95*  CO2 24  --   GLUCOSE 149* 148*  BUN 17 18  CREATININE 1.32* 1.30*  CALCIUM 9.0  --     GFR: Estimated Creatinine Clearance: 20 mL/min (A) (by C-G formula based on SCr of  1.3 mg/dL (H)).  Liver Function Tests: Recent Labs  Lab 08/28/20 2209  AST 29  ALT 19  ALKPHOS 78  BILITOT 1.1  PROT 6.2*  ALBUMIN 4.0    Urine analysis: No results found for: COLORURINE, APPEARANCEUR, LABSPEC, PHURINE, GLUCOSEU, HGBUR, BILIRUBINUR, KETONESUR, PROTEINUR, UROBILINOGEN, NITRITE, LEUKOCYTESUR  Radiological Exams on Admission: CT HEAD WO CONTRAST (5MM)  Result Date: 08/28/2020 CLINICAL DATA:  Fall EXAM: CT HEAD WITHOUT CONTRAST CT CERVICAL SPINE WITHOUT CONTRAST TECHNIQUE: Multidetector CT imaging of the head and cervical spine was performed following the standard protocol without intravenous contrast. Multiplanar CT image reconstructions of the cervical spine were also generated. COMPARISON:  CT head, maxillofacial and C-spine 07/01/2017 MR head 12/23/2015 FINDINGS: CT HEAD FINDINGS Brain: No evidence of acute infarction,  hemorrhage, hydrocephalus, extra-axial collection, visible mass lesion or mass effect. Symmetric prominence of the ventricles, cisterns and sulci compatible with parenchymal volume loss. Patchy areas of white matter hypoattenuation are most compatible with chronic microvascular angiopathy. Chronic dural calcifications. Midline structures are unremarkable. Vascular: Atherosclerotic calcification of the carotid siphons and intradural vertebral arteries. No hyperdense vessel. Skull: Right parietal scalp swelling and small laceration with thin crescentic scalp hematoma measuring up to 7 mm in maximal thickness. No subjacent calvarial fracture, sutural diastasis or other acute or traumatic osseous injury. No suspicious osseous lesions. Additional partly calcified subcutaneous nodule of the left high parietal scalp (4/69) most compatible with a benign trichilemmal cyst. Sinuses/Orbits: Paranasal sinuses and mastoid air cells are predominantly clear. Orbital structures are unremarkable aside from prior lens extractions. Other: Bilateral TMJ arthrosis. CT CERVICAL SPINE  FINDINGS Alignment: Stabilization collar in place at the time of exam. Straightening of the normal cervical lordosis. Focal reversal at the C5 level with retrolisthesis C5 on C6 of approximately 3 mm. Minimal 1 mm of anterolisthesis C3 on C4. Overall, these findings are not significantly changed from comparison prior and favored to be on a chronic degenerative basis. Stable appearance of the facets at this level. No evidence of traumatic listhesis. Bony fusion across the C2-3 articular facets bilaterally as well as on the left C4-5. No abnormally widened, perched or jumped facets. Normal alignment of the craniocervical and atlantoaxial articulations. Skull base and vertebrae: The osseous structures appear diffusely demineralized which may limit detection of small or nondisplaced fractures. No acute skull base fracture. No vertebral body fracture or height loss. No worrisome osseous lesions. Essentially complete effacement of the atlantodental interval with extensive arthrosis and additional degenerative ossicles/fragmented osteophytes at the dens, stable from prior with calcific pannus formation. Multilevel cervical spondylitic changes, further detailed below. Soft tissues and spinal canal: No pre or paravertebral fluid or swelling. No visible canal hematoma. Airways patent. No conspicuous cervical adenopathy. Cervical carotid atherosclerosis. No acute traumatic abnormality of the cervical spine. Disc levels: Multilevel intervertebral disc height loss with spondylitic endplate changes. Features most pronounced C5-6 where a combination of retrolisthesis and disc osteophyte complex with ligamentum flavum hypertrophy result in moderate to severe canal stenosis at this level. Central disc osteophyte complexes C3-4, C6-7 also resulting in some mild canal narrowing with additional effacement of the ventral thecal sac at the remaining cervical levels. Multilevel uncinate spurring and facet hypertrophic changes are present  throughout the cervical spine with mild to moderate multilevel neural foraminal narrowing and more moderate to severe narrowing bilaterally C3-4, C5-6. Upper chest: Biapical areas of pleuroparenchymal scarring. Some atelectatic changes are present. Mild interlobular septal thickening is noted as well, could reflect early developing edema. Calcification of the proximal great vessels. Other: 1.6 cm hypoattenuating nodule in the right lobe thyroid gland. In the setting of significant comorbidities or limited life expectancy, no follow-up recommended (ref: J Am Coll Radiol. 2015 Feb;12(2): 143-50). IMPRESSION: 1. Right parietal scalp thickening and laceration with crescentic scalp hematoma. No calvarial fracture. 2. No acute intracranial abnormality. 3. Background of microvascular angiopathy, parenchymal volume loss and intracranial and cervical atherosclerosis. 4. No acute fracture or traumatic listhesis of the cervical spine. 5. Multilevel cervical spondylitic changes, most pronounced C5-6 where there is severe canal stenosis and moderate to severe bilateral foraminal narrowing. Additional mild canal narrowing C3-4 with moderate to severe foraminal stenosis. Additional changes as above. 6. Biapical areas of pleuroparenchymal scarring with superimposed septal thickening, could reflect developing interstitial edema in the appropriate clinical  setting. Electronically Signed   By: Lovena Le M.D.   On: 08/28/2020 22:52   CT Cervical Spine Wo Contrast  Result Date: 08/28/2020 CLINICAL DATA:  Fall EXAM: CT HEAD WITHOUT CONTRAST CT CERVICAL SPINE WITHOUT CONTRAST TECHNIQUE: Multidetector CT imaging of the head and cervical spine was performed following the standard protocol without intravenous contrast. Multiplanar CT image reconstructions of the cervical spine were also generated. COMPARISON:  CT head, maxillofacial and C-spine 07/01/2017 MR head 12/23/2015 FINDINGS: CT HEAD FINDINGS Brain: No evidence of acute  infarction, hemorrhage, hydrocephalus, extra-axial collection, visible mass lesion or mass effect. Symmetric prominence of the ventricles, cisterns and sulci compatible with parenchymal volume loss. Patchy areas of white matter hypoattenuation are most compatible with chronic microvascular angiopathy. Chronic dural calcifications. Midline structures are unremarkable. Vascular: Atherosclerotic calcification of the carotid siphons and intradural vertebral arteries. No hyperdense vessel. Skull: Right parietal scalp swelling and small laceration with thin crescentic scalp hematoma measuring up to 7 mm in maximal thickness. No subjacent calvarial fracture, sutural diastasis or other acute or traumatic osseous injury. No suspicious osseous lesions. Additional partly calcified subcutaneous nodule of the left high parietal scalp (4/69) most compatible with a benign trichilemmal cyst. Sinuses/Orbits: Paranasal sinuses and mastoid air cells are predominantly clear. Orbital structures are unremarkable aside from prior lens extractions. Other: Bilateral TMJ arthrosis. CT CERVICAL SPINE FINDINGS Alignment: Stabilization collar in place at the time of exam. Straightening of the normal cervical lordosis. Focal reversal at the C5 level with retrolisthesis C5 on C6 of approximately 3 mm. Minimal 1 mm of anterolisthesis C3 on C4. Overall, these findings are not significantly changed from comparison prior and favored to be on a chronic degenerative basis. Stable appearance of the facets at this level. No evidence of traumatic listhesis. Bony fusion across the C2-3 articular facets bilaterally as well as on the left C4-5. No abnormally widened, perched or jumped facets. Normal alignment of the craniocervical and atlantoaxial articulations. Skull base and vertebrae: The osseous structures appear diffusely demineralized which may limit detection of small or nondisplaced fractures. No acute skull base fracture. No vertebral body fracture  or height loss. No worrisome osseous lesions. Essentially complete effacement of the atlantodental interval with extensive arthrosis and additional degenerative ossicles/fragmented osteophytes at the dens, stable from prior with calcific pannus formation. Multilevel cervical spondylitic changes, further detailed below. Soft tissues and spinal canal: No pre or paravertebral fluid or swelling. No visible canal hematoma. Airways patent. No conspicuous cervical adenopathy. Cervical carotid atherosclerosis. No acute traumatic abnormality of the cervical spine. Disc levels: Multilevel intervertebral disc height loss with spondylitic endplate changes. Features most pronounced C5-6 where a combination of retrolisthesis and disc osteophyte complex with ligamentum flavum hypertrophy result in moderate to severe canal stenosis at this level. Central disc osteophyte complexes C3-4, C6-7 also resulting in some mild canal narrowing with additional effacement of the ventral thecal sac at the remaining cervical levels. Multilevel uncinate spurring and facet hypertrophic changes are present throughout the cervical spine with mild to moderate multilevel neural foraminal narrowing and more moderate to severe narrowing bilaterally C3-4, C5-6. Upper chest: Biapical areas of pleuroparenchymal scarring. Some atelectatic changes are present. Mild interlobular septal thickening is noted as well, could reflect early developing edema. Calcification of the proximal great vessels. Other: 1.6 cm hypoattenuating nodule in the right lobe thyroid gland. In the setting of significant comorbidities or limited life expectancy, no follow-up recommended (ref: J Am Coll Radiol. 2015 Feb;12(2): 143-50). IMPRESSION: 1. Right parietal scalp thickening and laceration  with crescentic scalp hematoma. No calvarial fracture. 2. No acute intracranial abnormality. 3. Background of microvascular angiopathy, parenchymal volume loss and intracranial and cervical  atherosclerosis. 4. No acute fracture or traumatic listhesis of the cervical spine. 5. Multilevel cervical spondylitic changes, most pronounced C5-6 where there is severe canal stenosis and moderate to severe bilateral foraminal narrowing. Additional mild canal narrowing C3-4 with moderate to severe foraminal stenosis. Additional changes as above. 6. Biapical areas of pleuroparenchymal scarring with superimposed septal thickening, could reflect developing interstitial edema in the appropriate clinical setting. Electronically Signed   By: Lovena Le M.D.   On: 08/28/2020 22:52   DG Chest Port 1 View  Result Date: 08/28/2020 CLINICAL DATA:  Fall EXAM: PORTABLE CHEST 1 VIEW COMPARISON:  07/15/2020 FINDINGS: Mild cardiomegaly. No confluent airspace disease or effusion. Biapical scarring. No pneumothorax. No acute bony abnormality. IMPRESSION: No active disease. Electronically Signed   By: Rolm Baptise M.D.   On: 08/28/2020 22:20   DG Hip Unilat W or Wo Pelvis 2-3 Views Right  Result Date: 08/28/2020 CLINICAL DATA:  Fall EXAM: DG HIP (WITH OR WITHOUT PELVIS) 2-3V RIGHT COMPARISON:  None. FINDINGS: There is a right femoral neck fracture with varus angulation. No subluxation or dislocation. Mild degenerative changes in the hips. IMPRESSION: Right femoral neck fracture with varus angulation. Electronically Signed   By: Rolm Baptise M.D.   On: 08/28/2020 22:20     Assessment/Plan Principal Problem:   Closed right hip fracture  Ms. Grullon has right hip fracture.  Orthopedics has been consulted. NPO after midnight per Ortho request.  Pain control with dilaudid overnight.  Bedrest.  Will need PT evaluation and case management consult after ortho evaluates pt to arrange for rehab needs.   Active Problems:   AKI (acute kidney injury) IVF hydration with LR at 75 ml/hr.  Recheck renal function and electrolytes in am    AF (paroxysmal atrial fibrillation)  Has history of paroxysmal atrial fibrillation  per daughter.  Is on amiodarone per daughter.  She is on Plavix.  She is not on DOAC or Coumadin secondary to fall risks    Hyponatremia LR IVF hydration. Recheck electrolytes in am    DVT prophylaxis: SCD for DVT prophylaxis with impending surgical repair of right hip  Code Status:   Full Code. Discussed with pt and her daughter who was at bedside  Family Communication:  Diagnosis and plan discussed with patient and her daughter who is at bedside.  They verbalized understanding and agree with plan.  Questions answered.  Further recommendations to follow as clinical indicated Disposition Plan:   Patient is from:  Home  Anticipated DC to:  Home vs Rehab, to be determined  Anticipated DC date:  Anticipate more than 2 midnight stay in the hospital  Anticipated DC barriers: Arranging rehab needs could be barrier to expeditious discharge  Consults called:  Orthopedic surgery consulted by Er physician  Admission status:  Inpatient   Yevonne Aline Kiearra Oyervides MD [facility]  How to contact the [facility] Attending or Consulting provider [place] or covering provider during after hours 7P -7A, for this patient?   Check the care team in [facility] and look for a) attending/consulting [facility] provider listed and b) the [facility] team listed Log into [email or web address] and use 's universal password to access. If you do not have the password, please contact the hospital operator. Locate the [facility] provider you are looking for under [facility] and page to a number that you can  be directly reached. If you still have difficulty reaching the provider, please page the [facility] (Director on Call) for the Hospitalists listed on amion for assistance.  08/29/2020, 12:12 AM

## 2020-08-29 NOTE — Progress Notes (Signed)
Care started prior to midnight in the emergency room and patient was admitted early this morning after midnight by Dr. Harrold Donath and I am in current agreement with his assessment and plan.  Additional changes of the plan of care been made accordingly.  The patient is a 87 year old Caucasian female with a past medical history significant for but not limited to proximal atrial fibrillation on antiplatelets with Plavix as well as other comorbidities presented via EMS after a fall at her home.  Patient states that she is getting up along her back door before she went to bed as she normally does and she lost her balance and fell landing on her right side.  She reported pain in her right hip and was unable to stand.  She also had the back of the right side of her head on the floor but did not lose consciousness.  She had her cell phone with her and call 911.  EMS placed her in a c-collar and brought to emergency room.  She is not hypoxic and did not have any complaints of chest pain.  She did report having a mild headache in the back of her head but no visual changes.  She denies any numbness of her extremities or face.  Patient was alert and oriented and she does take Plavix and amiodarone for proximal atrial fibrillation and is very independent lives alone.  She was admitted for a closed right hip fracture and orthopedics was consulted.  Pain control was initiated and the plan is for a right hip Hemi anterior approach tomorrow morning by Dr. Lyla Glassing.  She will be made n.p.o. after midnight.  Currently she is being admitted and treated for the following but not limited to:  Closed right hip fracture  -Ms. Ricciardi has right hip fracture.  -Orthopedics has been consulted. Will be made NPO after midnight per Ortho request.  -Pain control with dilaudid 0.5 mg IV every 2 as needed for moderate pain -Bedrest.  -Will need PT evaluation and case management consult after ortho evaluates pt to arrange for rehab  needs.    AKI (acute kidney injury) -IVF hydration with LR at 75 ml/hr to be continued -Continue to hold her losartan/hydrochlorothiazide combination pill -Recheck renal function and electrolytes in am   AF (paroxysmal atrial fibrillation)  -Has history of paroxysmal atrial fibrillation per daughter.  Is on amiodarone per daughter.  -She is on Plavix which is being held.  She is not on DOAC or Coumadin secondary to fall risks   Hyponatremia -C/w LR IVF hydration. Recheck electrolytes in am -Na+ went from 129 -> 130 -> 130 -Continue to Monitor and Trend -Repeat CMP in the AM  Normocytic Anemia -Patient's Hgb/Hct went from 10.9/33.4 -> 10.9/32.0 -Check Anemia Panel in the AM -Continue to Monitor for S/Sx of Bleeding; No overt bleeding noted -Repeat CBC in the AM   GERD -C/w PPI with Pantoprazole 40 mg every Morning   HLD -C/w Atorvastatin 10 mg po Daily   Depression and Anxiety -C/w Sertraline 50 mg po Daily   We will continue to monitor the patient's clinical response to intervention and repeat blood work and follow-up on specialist recommendations and morning

## 2020-08-29 NOTE — Anesthesia Preprocedure Evaluation (Addendum)
Anesthesia Evaluation  Patient identified by MRN, date of birth, ID band Patient awake    Reviewed: Allergy & Precautions, NPO status , Patient's Chart, lab work & pertinent test results  History of Anesthesia Complications Negative for: history of anesthetic complications  Airway Mallampati: III  TM Distance: >3 FB Neck ROM: Full    Dental  (+) Dental Advisory Given   Pulmonary neg pulmonary ROS,    Pulmonary exam normal        Cardiovascular hypertension, Pt. on medications Normal cardiovascular exam+ dysrhythmias Atrial Fibrillation      Neuro/Psych negative neurological ROS  negative psych ROS   GI/Hepatic Neg liver ROS, GERD  Medicated and Controlled,  Endo/Other   Na 128 Cl 93   Renal/GU Renal InsufficiencyRenal disease     Musculoskeletal negative musculoskeletal ROS (+)   Abdominal   Peds  Hematology  (+) anemia ,  On plavix, last dose 8/11 Plt 127k    Anesthesia Other Findings   Reproductive/Obstetrics                            Anesthesia Physical Anesthesia Plan  ASA: 3  Anesthesia Plan: General   Post-op Pain Management:    Induction: Intravenous  PONV Risk Score and Plan: 3 and Treatment may vary due to age or medical condition, Ondansetron and TIVA  Airway Management Planned: Oral ETT  Additional Equipment: None  Intra-op Plan:   Post-operative Plan: Extubation in OR  Informed Consent: I have reviewed the patients History and Physical, chart, labs and discussed the procedure including the risks, benefits and alternatives for the proposed anesthesia with the patient or authorized representative who has indicated his/her understanding and acceptance.     Dental advisory given and Consent reviewed with POA  Plan Discussed with: CRNA, Anesthesiologist and Surgeon  Anesthesia Plan Comments: (Surgeon aware of recent plavix, wants to proceed)       Anesthesia Quick Evaluation

## 2020-08-29 NOTE — Progress Notes (Signed)
Asked by ortho trauma to take patient for R hip fx. Plan for R hip hemi anterior approach tomorrow am. NPO after MN. Hold chemical DVT ppx. Will give OTO lovenox now. R knee films ordered. Full consult to follow.

## 2020-08-29 NOTE — ED Notes (Signed)
Report given to [facility]N

## 2020-08-29 NOTE — ED Notes (Signed)
EDP cleared pt for RN to remove C-collar, which RN did.  RN updated pt and daughter on status.  Pt given wet sponge for mouth.

## 2020-08-29 NOTE — Consult Note (Signed)
Reason for Consult:Right hip fx Referring Physician: Raiford Noble Time called: 0730 Time at bedside: 0845   Sheri Hensley is an 87 y.o. female.  HPI: Jamielyn was locking her door last night when she lost her balance and fell. She had immediate right hip pain and could not get up. She was brought to the ED where x-rays showed a right hip fx and orthopedic surgery was consulted. She c/o localized pain to the hip. She lives alone and usually ambulates with a cane or RW but not always.  Past Medical History:  Diagnosis Date   Atrial fibrillation [facility])     History reviewed. No pertinent surgical history.  History reviewed. No pertinent family history.  Social History:  reports that she has never smoked. She has never used smokeless tobacco. She reports that she does not drink alcohol and does not use drugs.  Allergies:  Allergies  Allergen Reactions   Lorazepam Other (See Comments)    Hallucinations   Metronidazole Rash    Medications: I have reviewed the patient's current medications.  Results for orders placed or performed during the hospital encounter of 08/28/20 (from the past 48 hour(s))  Comprehensive metabolic panel     Status: Abnormal   Collection Time: 08/28/20 10:09 PM  Result Value Ref Range   Sodium 129 (L) 135 - 145 mmol/L   Potassium 3.8 3.5 - 5.1 mmol/L   Chloride 93 (L) 98 - 111 mmol/L   CO2 24 22 - 32 mmol/L   Glucose, Bld 149 (H) 70 - 99 mg/dL    Comment: Glucose reference range applies only to samples taken after fasting for at least 8 hours.   BUN 17 8 - 23 mg/dL   Creatinine, Ser 1.32 (H) 0.44 - 1.00 mg/dL   Calcium 9.0 8.9 - 10.3 mg/dL   Total Protein 6.2 (L) 6.5 - 8.1 g/dL   Albumin 4.0 3.5 - 5.0 g/dL   AST 29 15 - 41 U/L   ALT 19 0 - 44 U/L   Alkaline Phosphatase 78 38 - 126 U/L   Total Bilirubin 1.1 0.3 - 1.2 mg/dL   GFR, Estimated 36 (L) >60 mL/min    Comment: (NOTE) Calculated using the CKD-EPI Creatinine Equation (2021)    Anion  gap 12 5 - 15    Comment: Performed at [facility] [address] [place], [place] [address]  CBC     Status: Abnormal   Collection Time: 08/28/20 10:09 PM  Result Value Ref Range   WBC 7.7 4.0 - 10.5 K/uL   RBC 3.75 (L) 3.87 - 5.11 MIL/uL   Hemoglobin 10.9 (L) 12.0 - 15.0 g/dL   HCT 33.4 (L) 36.0 - 46.0 %   MCV 89.1 80.0 - 100.0 fL   MCH 29.1 26.0 - 34.0 pg   MCHC 32.6 30.0 - 36.0 g/dL   RDW 14.5 11.5 - 15.5 %   Platelets 130 (L) 150 - 400 K/uL   nRBC 0.0 0.0 - 0.2 %    Comment: Performed at [facility], [place] [address] [place], [place] [address]  Ethanol     Status: None   Collection Time: 08/28/20 10:09 PM  Result Value Ref Range   Alcohol, Ethyl (B) <10 <10 mg/dL    Comment: (NOTE) Lowest detectable limit for serum alcohol is 10 mg/dL.  For medical purposes only. Performed at [facility], [place] [address], [place], [place] [address]   Lactic acid, plasma  Status: Abnormal   Collection Time: 08/28/20 10:09 PM  Result Value Ref Range   Lactic Acid, Venous 2.2 (HH) 0.5 - 1.9 mmol/L    Comment: CRITICAL RESULT CALLED TO, READ BACK BY AND VERIFIED WITH: Bennie Hind, RN. '@[id number]'$  [id number] BLANKENSHIP R.  Performed at [facility], [place] [address] [employer] [place], [place] [address]   Protime-INR     Status: None   Collection Time: 08/28/20 10:09 PM  Result Value Ref Range   Prothrombin Time 14.4 11.4 - 15.2 seconds   INR 1.1 0.8 - 1.2    Comment: (NOTE) INR goal varies based on device and disease states. Performed at [facility], [place] [address], [place], [place] [address]   I-Stat Chem 8, ED     Status: Abnormal   Collection Time: 08/28/20 10:13 PM  Result Value Ref Range   Sodium 130 (L) 135 - 145 mmol/L   Potassium 3.8 3.5 - 5.1 mmol/L   Chloride 95 (L) 98 - 111 mmol/L   BUN 18 8 - 23 mg/dL   Creatinine, Ser 1.30 (H) 0.44 - 1.00 mg/dL   Glucose, Bld 148 (H) 70 - 99 mg/dL    Comment: Glucose reference range applies only to samples taken  after fasting for at least 8 hours.   Calcium, Ion 1.04 (L) 1.15 - 1.40 mmol/L   TCO2 25 22 - 32 mmol/L   Hemoglobin 10.9 (L) 12.0 - 15.0 g/dL   HCT 32.0 (L) 36.0 - 46.0 %  Resp Panel by RT-PCR (Flu A&B, Covid) Nasopharyngeal Swab     Status: None   Collection Time: 08/28/20 10:37 PM   Specimen: Nasopharyngeal Swab; Nasopharyngeal(NP) swabs in vial transport medium  Result Value Ref Range   SARS Coronavirus 2 by RT PCR NEGATIVE NEGATIVE    Comment: (NOTE) SARS-CoV-2 target nucleic acids are NOT DETECTED.  The SARS-CoV-2 RNA is generally detectable in upper respiratory specimens during the acute phase of infection. The lowest concentration of SARS-CoV-2 viral copies this assay can detect is 138 copies/mL. A negative result does not preclude SARS-Cov-2 infection and should not be used as the sole basis for treatment or other patient management decisions. A negative result may occur with  improper specimen collection/handling, submission of specimen other than nasopharyngeal swab, presence of viral mutation(s) within the areas targeted by this assay, and inadequate number of viral copies(<138 copies/mL). A negative result must be combined with clinical observations, patient history, and epidemiological information. The expected result is Negative.  Fact Sheet for Patients:  EntrepreneurPulse.com.au  Fact Sheet for Healthcare Providers:  IncredibleEmployment.be  This test is no t yet approved or cleared by the [place] FDA and  has been authorized for detection and/or diagnosis of SARS-CoV-2 by FDA under an Emergency Use Authorization (EUA). This EUA will remain  in effect (meaning this test can be used) for the duration of the COVID-19 declaration under Section 564(b)(1) of the Act, 21 U.S.C.section 360bbb-3(b)(1), unless the authorization is terminated  or revoked sooner.       Influenza A by PCR NEGATIVE NEGATIVE   Influenza B by PCR  NEGATIVE NEGATIVE    Comment: (NOTE) The Xpert Xpress SARS-CoV-2/FLU/RSV plus assay is intended as an aid in the diagnosis of influenza from Nasopharyngeal swab specimens and should not be used as a sole basis for treatment. Nasal washings and aspirates are unacceptable for Xpert Xpress SARS-CoV-2/FLU/RSV testing.  Fact Sheet for Patients: EntrepreneurPulse.com.au  Fact Sheet for Healthcare Providers: IncredibleEmployment.be  This test is  not yet approved or cleared by the [place] and has been authorized for detection and/or diagnosis of SARS-CoV-2 by FDA under an Emergency Use Authorization (EUA). This EUA will remain in effect (meaning this test can be used) for the duration of the COVID-19 declaration under Section 564(b)(1) of the Act, 21 U.S.C. section 360bbb-3(b)(1), unless the authorization is terminated or revoked.  Performed at [facility], [place] [address], [place], [place] [address]   Urinalysis, Routine w reflex microscopic Urine, Clean Catch     Status: None   Collection Time: 08/29/20  1:32 AM  Result Value Ref Range   Color, Urine YELLOW YELLOW   APPearance CLEAR CLEAR   Specific Gravity, Urine 1.010 1.005 - 1.030   pH 8.0 5.0 - 8.0   Glucose, UA NEGATIVE NEGATIVE mg/dL   Hgb urine dipstick NEGATIVE NEGATIVE   Bilirubin Urine NEGATIVE NEGATIVE   Ketones, ur NEGATIVE NEGATIVE mg/dL   Protein, ur NEGATIVE NEGATIVE mg/dL   Nitrite NEGATIVE NEGATIVE   Leukocytes,Ua NEGATIVE NEGATIVE    Comment: Performed at [facility] [address], [place], [place] [id number]  Basic metabolic panel     Status: Abnormal   Collection Time: 08/29/20  5:00 AM  Result Value Ref Range   Sodium 130 (L) 135 - 145 mmol/L   Potassium 4.0 3.5 - 5.1 mmol/L   Chloride 95 (L) 98 - 111 mmol/L   CO2 23 22 - 32 mmol/L   Glucose, Bld 112 (H) 70 - 99 mg/dL    Comment: Glucose reference range applies only to samples taken after fasting  for at least 8 hours.   BUN 16 8 - 23 mg/dL   Creatinine, Ser 1.25 (H) 0.44 - 1.00 mg/dL   Calcium 9.6 8.9 - 10.3 mg/dL   GFR, Estimated 39 (L) >60 mL/min    Comment: (NOTE) Calculated using the CKD-EPI Creatinine Equation (2021)    Anion gap 12 5 - 15    Comment: Performed at [facility] [address], [place],  [place] [address]    CT HEAD WO CONTRAST (5MM)  Result Date: 08/28/2020 CLINICAL DATA:  Fall EXAM: CT HEAD WITHOUT CONTRAST CT CERVICAL SPINE WITHOUT CONTRAST TECHNIQUE: Multidetector CT imaging of the head and cervical spine was performed following the standard protocol without intravenous contrast. Multiplanar CT image reconstructions of the cervical spine were also generated. COMPARISON:  CT head, maxillofacial and C-spine 07/01/2017 MR head 12/23/2015 FINDINGS: CT HEAD FINDINGS Brain: No evidence of acute infarction, hemorrhage, hydrocephalus, extra-axial collection, visible mass lesion or mass effect. Symmetric prominence of the ventricles, cisterns and sulci compatible with parenchymal volume loss. Patchy areas of white matter hypoattenuation are most compatible with chronic microvascular angiopathy. Chronic dural calcifications. Midline structures are unremarkable. Vascular: Atherosclerotic calcification of the carotid siphons and intradural vertebral arteries. No hyperdense vessel. Skull: Right parietal scalp swelling and small laceration with thin crescentic scalp hematoma measuring up to 7 mm in maximal thickness. No subjacent calvarial fracture, sutural diastasis or other acute or traumatic osseous injury. No suspicious osseous lesions. Additional partly calcified subcutaneous nodule of the left high parietal scalp (4/69) most compatible with a benign trichilemmal cyst. Sinuses/Orbits: Paranasal sinuses and mastoid air cells are predominantly clear. Orbital structures are unremarkable aside from prior lens extractions. Other: Bilateral TMJ arthrosis. CT CERVICAL SPINE  FINDINGS Alignment: Stabilization collar in place at the time of exam. Straightening of the normal cervical lordosis. Focal reversal at the C5 level with retrolisthesis C5 on C6  of approximately 3 mm. Minimal 1 mm of anterolisthesis C3 on C4. Overall, these findings are not significantly changed from comparison prior and favored to be on a chronic degenerative basis. Stable appearance of the facets at this level. No evidence of traumatic listhesis. Bony fusion across the C2-3 articular facets bilaterally as well as on the left C4-5. No abnormally widened, perched or jumped facets. Normal alignment of the craniocervical and atlantoaxial articulations. Skull base and vertebrae: The osseous structures appear diffusely demineralized which may limit detection of small or nondisplaced fractures. No acute skull base fracture. No vertebral body fracture or height loss. No worrisome osseous lesions. Essentially complete effacement of the atlantodental interval with extensive arthrosis and additional degenerative ossicles/fragmented osteophytes at the dens, stable from prior with calcific pannus formation. Multilevel cervical spondylitic changes, further detailed below. Soft tissues and spinal canal: No pre or paravertebral fluid or swelling. No visible canal hematoma. Airways patent. No conspicuous cervical adenopathy. Cervical carotid atherosclerosis. No acute traumatic abnormality of the cervical spine. Disc levels: Multilevel intervertebral disc height loss with spondylitic endplate changes. Features most pronounced C5-6 where a combination of retrolisthesis and disc osteophyte complex with ligamentum flavum hypertrophy result in moderate to severe canal stenosis at this level. Central disc osteophyte complexes C3-4, C6-7 also resulting in some mild canal narrowing with additional effacement of the ventral thecal sac at the remaining cervical levels. Multilevel uncinate spurring and facet hypertrophic changes are present  throughout the cervical spine with mild to moderate multilevel neural foraminal narrowing and more moderate to severe narrowing bilaterally C3-4, C5-6. Upper chest: Biapical areas of pleuroparenchymal scarring. Some atelectatic changes are present. Mild interlobular septal thickening is noted as well, could reflect early developing edema. Calcification of the proximal great vessels. Other: 1.6 cm hypoattenuating nodule in the right lobe thyroid gland. In the setting of significant comorbidities or limited life expectancy, no follow-up recommended (ref: J Am Coll Radiol. 2015 Feb;12(2): 143-50). IMPRESSION: 1. Right parietal scalp thickening and laceration with crescentic scalp hematoma. No calvarial fracture. 2. No acute intracranial abnormality. 3. Background of microvascular angiopathy, parenchymal volume loss and intracranial and cervical atherosclerosis. 4. No acute fracture or traumatic listhesis of the cervical spine. 5. Multilevel cervical spondylitic changes, most pronounced C5-6 where there is severe canal stenosis and moderate to severe bilateral foraminal narrowing. Additional mild canal narrowing C3-4 with moderate to severe foraminal stenosis. Additional changes as above. 6. Biapical areas of pleuroparenchymal scarring with superimposed septal thickening, could reflect developing interstitial edema in the appropriate clinical setting. Electronically Signed   By: Lovena Le M.D.   On: 08/28/2020 22:52   CT Cervical Spine Wo Contrast  Result Date: 08/28/2020 CLINICAL DATA:  Fall EXAM: CT HEAD WITHOUT CONTRAST CT CERVICAL SPINE WITHOUT CONTRAST TECHNIQUE: Multidetector CT imaging of the head and cervical spine was performed following the standard protocol without intravenous contrast. Multiplanar CT image reconstructions of the cervical spine were also generated. COMPARISON:  CT head, maxillofacial and C-spine 07/01/2017 MR head 12/23/2015 FINDINGS: CT HEAD FINDINGS Brain: No evidence of acute  infarction, hemorrhage, hydrocephalus, extra-axial collection, visible mass lesion or mass effect. Symmetric prominence of the ventricles, cisterns and sulci compatible with parenchymal volume loss. Patchy areas of white matter hypoattenuation are most compatible with chronic microvascular angiopathy. Chronic dural calcifications. Midline structures are unremarkable. Vascular: Atherosclerotic calcification of the carotid siphons and intradural vertebral arteries. No hyperdense vessel. Skull: Right parietal scalp swelling and small laceration with thin crescentic scalp hematoma measuring up to 7 mm in maximal  thickness. No subjacent calvarial fracture, sutural diastasis or other acute or traumatic osseous injury. No suspicious osseous lesions. Additional partly calcified subcutaneous nodule of the left high parietal scalp (4/69) most compatible with a benign trichilemmal cyst. Sinuses/Orbits: Paranasal sinuses and mastoid air cells are predominantly clear. Orbital structures are unremarkable aside from prior lens extractions. Other: Bilateral TMJ arthrosis. CT CERVICAL SPINE FINDINGS Alignment: Stabilization collar in place at the time of exam. Straightening of the normal cervical lordosis. Focal reversal at the C5 level with retrolisthesis C5 on C6 of approximately 3 mm. Minimal 1 mm of anterolisthesis C3 on C4. Overall, these findings are not significantly changed from comparison prior and favored to be on a chronic degenerative basis. Stable appearance of the facets at this level. No evidence of traumatic listhesis. Bony fusion across the C2-3 articular facets bilaterally as well as on the left C4-5. No abnormally widened, perched or jumped facets. Normal alignment of the craniocervical and atlantoaxial articulations. Skull base and vertebrae: The osseous structures appear diffusely demineralized which may limit detection of small or nondisplaced fractures. No acute skull base fracture. No vertebral body fracture  or height loss. No worrisome osseous lesions. Essentially complete effacement of the atlantodental interval with extensive arthrosis and additional degenerative ossicles/fragmented osteophytes at the dens, stable from prior with calcific pannus formation. Multilevel cervical spondylitic changes, further detailed below. Soft tissues and spinal canal: No pre or paravertebral fluid or swelling. No visible canal hematoma. Airways patent. No conspicuous cervical adenopathy. Cervical carotid atherosclerosis. No acute traumatic abnormality of the cervical spine. Disc levels: Multilevel intervertebral disc height loss with spondylitic endplate changes. Features most pronounced C5-6 where a combination of retrolisthesis and disc osteophyte complex with ligamentum flavum hypertrophy result in moderate to severe canal stenosis at this level. Central disc osteophyte complexes C3-4, C6-7 also resulting in some mild canal narrowing with additional effacement of the ventral thecal sac at the remaining cervical levels. Multilevel uncinate spurring and facet hypertrophic changes are present throughout the cervical spine with mild to moderate multilevel neural foraminal narrowing and more moderate to severe narrowing bilaterally C3-4, C5-6. Upper chest: Biapical areas of pleuroparenchymal scarring. Some atelectatic changes are present. Mild interlobular septal thickening is noted as well, could reflect early developing edema. Calcification of the proximal great vessels. Other: 1.6 cm hypoattenuating nodule in the right lobe thyroid gland. In the setting of significant comorbidities or limited life expectancy, no follow-up recommended (ref: J Am Coll Radiol. 2015 Feb;12(2): 143-50). IMPRESSION: 1. Right parietal scalp thickening and laceration with crescentic scalp hematoma. No calvarial fracture. 2. No acute intracranial abnormality. 3. Background of microvascular angiopathy, parenchymal volume loss and intracranial and cervical  atherosclerosis. 4. No acute fracture or traumatic listhesis of the cervical spine. 5. Multilevel cervical spondylitic changes, most pronounced C5-6 where there is severe canal stenosis and moderate to severe bilateral foraminal narrowing. Additional mild canal narrowing C3-4 with moderate to severe foraminal stenosis. Additional changes as above. 6. Biapical areas of pleuroparenchymal scarring with superimposed septal thickening, could reflect developing interstitial edema in the appropriate clinical setting. Electronically Signed   By: Lovena Le M.D.   On: 08/28/2020 22:52   DG Chest Port 1 View  Result Date: 08/28/2020 CLINICAL DATA:  Fall EXAM: PORTABLE CHEST 1 VIEW COMPARISON:  07/15/2020 FINDINGS: Mild cardiomegaly. No confluent airspace disease or effusion. Biapical scarring. No pneumothorax. No acute bony abnormality. IMPRESSION: No active disease. Electronically Signed   By: Rolm Baptise M.D.   On: 08/28/2020 22:20   DG Hip  Unilat W or Wo Pelvis 2-3 Views Right  Result Date: 08/28/2020 CLINICAL DATA:  Fall EXAM: DG HIP (WITH OR WITHOUT PELVIS) 2-3V RIGHT COMPARISON:  None. FINDINGS: There is a right femoral neck fracture with varus angulation. No subluxation or dislocation. Mild degenerative changes in the hips. IMPRESSION: Right femoral neck fracture with varus angulation. Electronically Signed   By: Rolm Baptise M.D.   On: 08/28/2020 22:20    Review of Systems  HENT:  Negative for ear discharge, ear pain, hearing loss and tinnitus.   Eyes:  Negative for photophobia and pain.  Respiratory:  Negative for cough and shortness of breath.   Cardiovascular:  Negative for chest pain.  Gastrointestinal:  Negative for abdominal pain, nausea and vomiting.  Genitourinary:  Negative for dysuria, flank pain, frequency and urgency.  Musculoskeletal:  Positive for arthralgias (Right hip). Negative for back pain, myalgias and neck pain.  Neurological:  Negative for dizziness and headaches.   Hematological:  Does not bruise/bleed easily.  Psychiatric/Behavioral:  The patient is not nervous/anxious.   Blood pressure (!) 143/89, pulse 90, temperature 100.3 F (37.9 C), temperature source Oral, resp. rate (!) 22, height '5\' 3"'$  (1.6 m), weight 60.3 kg, SpO2 94 %. Physical Exam Constitutional:      General: She is not in acute distress.    Appearance: She is well-developed. She is not diaphoretic.  HENT:     Head: Normocephalic and atraumatic.  Eyes:     General: No scleral icterus.       Right eye: No discharge.        Left eye: No discharge.     Conjunctiva/sclera: Conjunctivae normal.  Cardiovascular:     Rate and Rhythm: Normal rate and regular rhythm.  Pulmonary:     Effort: Pulmonary effort is normal. No respiratory distress.  Musculoskeletal:     Cervical back: Normal range of motion.     Comments: RLE No traumatic wounds, ecchymosis, or rash  Mild TTP hip  No knee or ankle effusion  Knee stable to varus/ valgus and anterior/posterior stress  Sens DPN, SPN, TN intact  Motor EHL, ext, flex, evers 5/5  DP 1+, PT 1+, No significant edema  Skin:    General: Skin is warm and dry.  Neurological:     Mental Status: She is alert.  Psychiatric:        Mood and Affect: Mood normal.        Behavior: Behavior normal.    Assessment/Plan: Right hip fx -- Plan hip hemi tomorrow by Dr. Lyla Glassing. Please keep NPO after MN.    Lisette Abu, PA-C Orthopedic Surgery [phone] 08/29/2020, 9:03 AM

## 2020-08-29 NOTE — ED Notes (Signed)
Pt has 2+ right pedal pulse, warm to touch, ap refill less than 3 sec, pt aable to wiggle toes.

## 2020-08-29 NOTE — TOC CAGE-AID Note (Signed)
Transition of Care [facility]) - CAGE-AID Screening   Patient Details  Name: Sheri Hensley MRN: [id number] Date of Birth: 01/27/1919  Transition of Care [facility]) CM/SW Contact:    Allianna Beaubien C Tarpley-Carter, [place] Phone Number: 08/29/2020, 12:03 PM   Clinical Narrative: Pt is unable to participate in Cage Aid. Pt is not appropriate for assessment.  Haillee Johann Tarpley-Carter, MSW, LCSW-A Pronouns:  She/Her/Hers [facility] HealthTransitions of Care Clinical Social Worker Direct Number:  [phone] [email or web address]  CAGE-AID Screening: Substance Abuse Screening unable to be completed due to: : Patient unable to participate

## 2020-08-29 NOTE — H&P (View-Only) (Signed)
Reason for Consult:Right hip fx Referring Physician: Raiford Noble Time called: 0730 Time at bedside: 0845   Sheri Hensley is an 87 y.o. female.  HPI: Sheri Hensley was locking her door last night when she lost her balance and fell. She had immediate right hip pain and could not get up. She was brought to the ED where x-rays showed a right hip fx and orthopedic surgery was consulted. She c/o localized pain to the hip. She lives alone and usually ambulates with a cane or RW but not always.  Past Medical History:  Diagnosis Date   Atrial fibrillation [facility])     History reviewed. No pertinent surgical history.  History reviewed. No pertinent family history.  Social History:  reports that she has never smoked. She has never used smokeless tobacco. She reports that she does not drink alcohol and does not use drugs.  Allergies:  Allergies  Allergen Reactions   Lorazepam Other (See Comments)    Hallucinations   Metronidazole Rash    Medications: I have reviewed the patient's current medications.  Results for orders placed or performed during the hospital encounter of 08/28/20 (from the past 48 hour(s))  Comprehensive metabolic panel     Status: Abnormal   Collection Time: 08/28/20 10:09 PM  Result Value Ref Range   Sodium 129 (L) 135 - 145 mmol/L   Potassium 3.8 3.5 - 5.1 mmol/L   Chloride 93 (L) 98 - 111 mmol/L   CO2 24 22 - 32 mmol/L   Glucose, Bld 149 (H) 70 - 99 mg/dL    Comment: Glucose reference range applies only to samples taken after fasting for at least 8 hours.   BUN 17 8 - 23 mg/dL   Creatinine, Ser 1.32 (H) 0.44 - 1.00 mg/dL   Calcium 9.0 8.9 - 10.3 mg/dL   Total Protein 6.2 (L) 6.5 - 8.1 g/dL   Albumin 4.0 3.5 - 5.0 g/dL   AST 29 15 - 41 U/L   ALT 19 0 - 44 U/L   Alkaline Phosphatase 78 38 - 126 U/L   Total Bilirubin 1.1 0.3 - 1.2 mg/dL   GFR, Estimated 36 (L) >60 mL/min    Comment: (NOTE) Calculated using the CKD-EPI Creatinine Equation (2021)    Anion  gap 12 5 - 15    Comment: Performed at [facility] [address], [place], [place] [address]  CBC     Status: Abnormal   Collection Time: 08/28/20 10:09 PM  Result Value Ref Range   WBC 7.7 4.0 - 10.5 K/uL   RBC 3.75 (L) 3.87 - 5.11 MIL/uL   Hemoglobin 10.9 (L) 12.0 - 15.0 g/dL   HCT 33.4 (L) 36.0 - 46.0 %   MCV 89.1 80.0 - 100.0 fL   MCH 29.1 26.0 - 34.0 pg   MCHC 32.6 30.0 - 36.0 g/dL   RDW 14.5 11.5 - 15.5 %   Platelets 130 (L) 150 - 400 K/uL   nRBC 0.0 0.0 - 0.2 %    Comment: Performed at [facility], [place] [address] [place], [place] [address]  Ethanol     Status: None   Collection Time: 08/28/20 10:09 PM  Result Value Ref Range   Alcohol, Ethyl (B) <10 <10 mg/dL    Comment: (NOTE) Lowest detectable limit for serum alcohol is 10 mg/dL.  For medical purposes only. Performed at [facility], [place] [address] [place], [place] [address]   Lactic acid, plasma  Status: Abnormal   Collection Time: 08/28/20 10:09 PM  Result Value Ref Range   Lactic Acid, Venous 2.2 (HH) 0.5 - 1.9 mmol/L    Comment: CRITICAL RESULT CALLED TO, READ BACK BY AND VERIFIED WITH: Bennie Hind, RN. '@[id number]'$  [id number] BLANKENSHIP R.  Performed at [facility], [place] [address], [place], [place] [address]   Protime-INR     Status: None   Collection Time: 08/28/20 10:09 PM  Result Value Ref Range   Prothrombin Time 14.4 11.4 - 15.2 seconds   INR 1.1 0.8 - 1.2    Comment: (NOTE) INR goal varies based on device and disease states. Performed at [facility], [place] [address] [place], [place] [address]   I-Stat Chem 8, ED     Status: Abnormal   Collection Time: 08/28/20 10:13 PM  Result Value Ref Range   Sodium 130 (L) 135 - 145 mmol/L   Potassium 3.8 3.5 - 5.1 mmol/L   Chloride 95 (L) 98 - 111 mmol/L   BUN 18 8 - 23 mg/dL   Creatinine, Ser 1.30 (H) 0.44 - 1.00 mg/dL   Glucose, Bld 148 (H) 70 - 99 mg/dL    Comment: Glucose reference range applies only to samples taken  after fasting for at least 8 hours.   Calcium, Ion 1.04 (L) 1.15 - 1.40 mmol/L   TCO2 25 22 - 32 mmol/L   Hemoglobin 10.9 (L) 12.0 - 15.0 g/dL   HCT 32.0 (L) 36.0 - 46.0 %  Resp Panel by RT-PCR (Flu A&B, Covid) Nasopharyngeal Swab     Status: None   Collection Time: 08/28/20 10:37 PM   Specimen: Nasopharyngeal Swab; Nasopharyngeal(NP) swabs in vial transport medium  Result Value Ref Range   SARS Coronavirus 2 by RT PCR NEGATIVE NEGATIVE    Comment: (NOTE) SARS-CoV-2 target nucleic acids are NOT DETECTED.  The SARS-CoV-2 RNA is generally detectable in upper respiratory specimens during the acute phase of infection. The lowest concentration of SARS-CoV-2 viral copies this assay can detect is 138 copies/mL. A negative result does not preclude SARS-Cov-2 infection and should not be used as the sole basis for treatment or other patient management decisions. A negative result may occur with  improper specimen collection/handling, submission of specimen other than nasopharyngeal swab, presence of viral mutation(s) within the areas targeted by this assay, and inadequate number of viral copies(<138 copies/mL). A negative result must be combined with clinical observations, patient history, and epidemiological information. The expected result is Negative.  Fact Sheet for Patients:  EntrepreneurPulse.com.au  Fact Sheet for Healthcare Providers:  IncredibleEmployment.be  This test is no t yet approved or cleared by the [place] FDA and  has been authorized for detection and/or diagnosis of SARS-CoV-2 by FDA under an Emergency Use Authorization (EUA). This EUA will remain  in effect (meaning this test can be used) for the duration of the COVID-19 declaration under Section 564(b)(1) of the Act, 21 U.S.C.section 360bbb-3(b)(1), unless the authorization is terminated  or revoked sooner.       Influenza A by PCR NEGATIVE NEGATIVE   Influenza B by PCR  NEGATIVE NEGATIVE    Comment: (NOTE) The Xpert Xpress SARS-CoV-2/FLU/RSV plus assay is intended as an aid in the diagnosis of influenza from Nasopharyngeal swab specimens and should not be used as a sole basis for treatment. Nasal washings and aspirates are unacceptable for Xpert Xpress SARS-CoV-2/FLU/RSV testing.  Fact Sheet for Patients: EntrepreneurPulse.com.au  Fact Sheet for Healthcare Providers: IncredibleEmployment.be  This test is  not yet approved or cleared by the [place] and has been authorized for detection and/or diagnosis of SARS-CoV-2 by FDA under an Emergency Use Authorization (EUA). This EUA will remain in effect (meaning this test can be used) for the duration of the COVID-19 declaration under Section 564(b)(1) of the Act, 21 U.S.C. section 360bbb-3(b)(1), unless the authorization is terminated or revoked.  Performed at [facility], [place] [address], [place], [place] [address]   Urinalysis, Routine w reflex microscopic Urine, Clean Catch     Status: None   Collection Time: 08/29/20  1:32 AM  Result Value Ref Range   Color, Urine YELLOW YELLOW   APPearance CLEAR CLEAR   Specific Gravity, Urine 1.010 1.005 - 1.030   pH 8.0 5.0 - 8.0   Glucose, UA NEGATIVE NEGATIVE mg/dL   Hgb urine dipstick NEGATIVE NEGATIVE   Bilirubin Urine NEGATIVE NEGATIVE   Ketones, ur NEGATIVE NEGATIVE mg/dL   Protein, ur NEGATIVE NEGATIVE mg/dL   Nitrite NEGATIVE NEGATIVE   Leukocytes,Ua NEGATIVE NEGATIVE    Comment: Performed at [facility] [address] [place], [place] [id number]  Basic metabolic panel     Status: Abnormal   Collection Time: 08/29/20  5:00 AM  Result Value Ref Range   Sodium 130 (L) 135 - 145 mmol/L   Potassium 4.0 3.5 - 5.1 mmol/L   Chloride 95 (L) 98 - 111 mmol/L   CO2 23 22 - 32 mmol/L   Glucose, Bld 112 (H) 70 - 99 mg/dL    Comment: Glucose reference range applies only to samples taken after fasting  for at least 8 hours.   BUN 16 8 - 23 mg/dL   Creatinine, Ser 1.25 (H) 0.44 - 1.00 mg/dL   Calcium 9.6 8.9 - 10.3 mg/dL   GFR, Estimated 39 (L) >60 mL/min    Comment: (NOTE) Calculated using the CKD-EPI Creatinine Equation (2021)    Anion gap 12 5 - 15    Comment: Performed at [facility] [address], [place], [place] [address]    CT HEAD WO CONTRAST (5MM)  Result Date: 08/28/2020 CLINICAL DATA:  Fall EXAM: CT HEAD WITHOUT CONTRAST CT CERVICAL SPINE WITHOUT CONTRAST TECHNIQUE: Multidetector CT imaging of the head and cervical spine was performed following the standard protocol without intravenous contrast. Multiplanar CT image reconstructions of the cervical spine were also generated. COMPARISON:  CT head, maxillofacial and C-spine 07/01/2017 MR head 12/23/2015 FINDINGS: CT HEAD FINDINGS Brain: No evidence of acute infarction, hemorrhage, hydrocephalus, extra-axial collection, visible mass lesion or mass effect. Symmetric prominence of the ventricles, cisterns and sulci compatible with parenchymal volume loss. Patchy areas of white matter hypoattenuation are most compatible with chronic microvascular angiopathy. Chronic dural calcifications. Midline structures are unremarkable. Vascular: Atherosclerotic calcification of the carotid siphons and intradural vertebral arteries. No hyperdense vessel. Skull: Right parietal scalp swelling and small laceration with thin crescentic scalp hematoma measuring up to 7 mm in maximal thickness. No subjacent calvarial fracture, sutural diastasis or other acute or traumatic osseous injury. No suspicious osseous lesions. Additional partly calcified subcutaneous nodule of the left high parietal scalp (4/69) most compatible with a benign trichilemmal cyst. Sinuses/Orbits: Paranasal sinuses and mastoid air cells are predominantly clear. Orbital structures are unremarkable aside from prior lens extractions. Other: Bilateral TMJ arthrosis. CT CERVICAL SPINE  FINDINGS Alignment: Stabilization collar in place at the time of exam. Straightening of the normal cervical lordosis. Focal reversal at the C5 level with retrolisthesis C5 on C6  of approximately 3 mm. Minimal 1 mm of anterolisthesis C3 on C4. Overall, these findings are not significantly changed from comparison prior and favored to be on a chronic degenerative basis. Stable appearance of the facets at this level. No evidence of traumatic listhesis. Bony fusion across the C2-3 articular facets bilaterally as well as on the left C4-5. No abnormally widened, perched or jumped facets. Normal alignment of the craniocervical and atlantoaxial articulations. Skull base and vertebrae: The osseous structures appear diffusely demineralized which may limit detection of small or nondisplaced fractures. No acute skull base fracture. No vertebral body fracture or height loss. No worrisome osseous lesions. Essentially complete effacement of the atlantodental interval with extensive arthrosis and additional degenerative ossicles/fragmented osteophytes at the dens, stable from prior with calcific pannus formation. Multilevel cervical spondylitic changes, further detailed below. Soft tissues and spinal canal: No pre or paravertebral fluid or swelling. No visible canal hematoma. Airways patent. No conspicuous cervical adenopathy. Cervical carotid atherosclerosis. No acute traumatic abnormality of the cervical spine. Disc levels: Multilevel intervertebral disc height loss with spondylitic endplate changes. Features most pronounced C5-6 where a combination of retrolisthesis and disc osteophyte complex with ligamentum flavum hypertrophy result in moderate to severe canal stenosis at this level. Central disc osteophyte complexes C3-4, C6-7 also resulting in some mild canal narrowing with additional effacement of the ventral thecal sac at the remaining cervical levels. Multilevel uncinate spurring and facet hypertrophic changes are present  throughout the cervical spine with mild to moderate multilevel neural foraminal narrowing and more moderate to severe narrowing bilaterally C3-4, C5-6. Upper chest: Biapical areas of pleuroparenchymal scarring. Some atelectatic changes are present. Mild interlobular septal thickening is noted as well, could reflect early developing edema. Calcification of the proximal great vessels. Other: 1.6 cm hypoattenuating nodule in the right lobe thyroid gland. In the setting of significant comorbidities or limited life expectancy, no follow-up recommended (ref: J Am Coll Radiol. 2015 Feb;12(2): 143-50). IMPRESSION: 1. Right parietal scalp thickening and laceration with crescentic scalp hematoma. No calvarial fracture. 2. No acute intracranial abnormality. 3. Background of microvascular angiopathy, parenchymal volume loss and intracranial and cervical atherosclerosis. 4. No acute fracture or traumatic listhesis of the cervical spine. 5. Multilevel cervical spondylitic changes, most pronounced C5-6 where there is severe canal stenosis and moderate to severe bilateral foraminal narrowing. Additional mild canal narrowing C3-4 with moderate to severe foraminal stenosis. Additional changes as above. 6. Biapical areas of pleuroparenchymal scarring with superimposed septal thickening, could reflect developing interstitial edema in the appropriate clinical setting. Electronically Signed   By: Lovena Le M.D.   On: 08/28/2020 22:52   CT Cervical Spine Wo Contrast  Result Date: 08/28/2020 CLINICAL DATA:  Fall EXAM: CT HEAD WITHOUT CONTRAST CT CERVICAL SPINE WITHOUT CONTRAST TECHNIQUE: Multidetector CT imaging of the head and cervical spine was performed following the standard protocol without intravenous contrast. Multiplanar CT image reconstructions of the cervical spine were also generated. COMPARISON:  CT head, maxillofacial and C-spine 07/01/2017 MR head 12/23/2015 FINDINGS: CT HEAD FINDINGS Brain: No evidence of acute  infarction, hemorrhage, hydrocephalus, extra-axial collection, visible mass lesion or mass effect. Symmetric prominence of the ventricles, cisterns and sulci compatible with parenchymal volume loss. Patchy areas of white matter hypoattenuation are most compatible with chronic microvascular angiopathy. Chronic dural calcifications. Midline structures are unremarkable. Vascular: Atherosclerotic calcification of the carotid siphons and intradural vertebral arteries. No hyperdense vessel. Skull: Right parietal scalp swelling and small laceration with thin crescentic scalp hematoma measuring up to 7 mm in maximal  thickness. No subjacent calvarial fracture, sutural diastasis or other acute or traumatic osseous injury. No suspicious osseous lesions. Additional partly calcified subcutaneous nodule of the left high parietal scalp (4/69) most compatible with a benign trichilemmal cyst. Sinuses/Orbits: Paranasal sinuses and mastoid air cells are predominantly clear. Orbital structures are unremarkable aside from prior lens extractions. Other: Bilateral TMJ arthrosis. CT CERVICAL SPINE FINDINGS Alignment: Stabilization collar in place at the time of exam. Straightening of the normal cervical lordosis. Focal reversal at the C5 level with retrolisthesis C5 on C6 of approximately 3 mm. Minimal 1 mm of anterolisthesis C3 on C4. Overall, these findings are not significantly changed from comparison prior and favored to be on a chronic degenerative basis. Stable appearance of the facets at this level. No evidence of traumatic listhesis. Bony fusion across the C2-3 articular facets bilaterally as well as on the left C4-5. No abnormally widened, perched or jumped facets. Normal alignment of the craniocervical and atlantoaxial articulations. Skull base and vertebrae: The osseous structures appear diffusely demineralized which may limit detection of small or nondisplaced fractures. No acute skull base fracture. No vertebral body fracture  or height loss. No worrisome osseous lesions. Essentially complete effacement of the atlantodental interval with extensive arthrosis and additional degenerative ossicles/fragmented osteophytes at the dens, stable from prior with calcific pannus formation. Multilevel cervical spondylitic changes, further detailed below. Soft tissues and spinal canal: No pre or paravertebral fluid or swelling. No visible canal hematoma. Airways patent. No conspicuous cervical adenopathy. Cervical carotid atherosclerosis. No acute traumatic abnormality of the cervical spine. Disc levels: Multilevel intervertebral disc height loss with spondylitic endplate changes. Features most pronounced C5-6 where a combination of retrolisthesis and disc osteophyte complex with ligamentum flavum hypertrophy result in moderate to severe canal stenosis at this level. Central disc osteophyte complexes C3-4, C6-7 also resulting in some mild canal narrowing with additional effacement of the ventral thecal sac at the remaining cervical levels. Multilevel uncinate spurring and facet hypertrophic changes are present throughout the cervical spine with mild to moderate multilevel neural foraminal narrowing and more moderate to severe narrowing bilaterally C3-4, C5-6. Upper chest: Biapical areas of pleuroparenchymal scarring. Some atelectatic changes are present. Mild interlobular septal thickening is noted as well, could reflect early developing edema. Calcification of the proximal great vessels. Other: 1.6 cm hypoattenuating nodule in the right lobe thyroid gland. In the setting of significant comorbidities or limited life expectancy, no follow-up recommended (ref: J Am Coll Radiol. 2015 Feb;12(2): 143-50). IMPRESSION: 1. Right parietal scalp thickening and laceration with crescentic scalp hematoma. No calvarial fracture. 2. No acute intracranial abnormality. 3. Background of microvascular angiopathy, parenchymal volume loss and intracranial and cervical  atherosclerosis. 4. No acute fracture or traumatic listhesis of the cervical spine. 5. Multilevel cervical spondylitic changes, most pronounced C5-6 where there is severe canal stenosis and moderate to severe bilateral foraminal narrowing. Additional mild canal narrowing C3-4 with moderate to severe foraminal stenosis. Additional changes as above. 6. Biapical areas of pleuroparenchymal scarring with superimposed septal thickening, could reflect developing interstitial edema in the appropriate clinical setting. Electronically Signed   By: Lovena Le M.D.   On: 08/28/2020 22:52   DG Chest Port 1 View  Result Date: 08/28/2020 CLINICAL DATA:  Fall EXAM: PORTABLE CHEST 1 VIEW COMPARISON:  07/15/2020 FINDINGS: Mild cardiomegaly. No confluent airspace disease or effusion. Biapical scarring. No pneumothorax. No acute bony abnormality. IMPRESSION: No active disease. Electronically Signed   By: Rolm Baptise M.D.   On: 08/28/2020 22:20   DG Hip  Unilat W or Wo Pelvis 2-3 Views Right  Result Date: 08/28/2020 CLINICAL DATA:  Fall EXAM: DG HIP (WITH OR WITHOUT PELVIS) 2-3V RIGHT COMPARISON:  None. FINDINGS: There is a right femoral neck fracture with varus angulation. No subluxation or dislocation. Mild degenerative changes in the hips. IMPRESSION: Right femoral neck fracture with varus angulation. Electronically Signed   By: Rolm Baptise M.D.   On: 08/28/2020 22:20    Review of Systems  HENT:  Negative for ear discharge, ear pain, hearing loss and tinnitus.   Eyes:  Negative for photophobia and pain.  Respiratory:  Negative for cough and shortness of breath.   Cardiovascular:  Negative for chest pain.  Gastrointestinal:  Negative for abdominal pain, nausea and vomiting.  Genitourinary:  Negative for dysuria, flank pain, frequency and urgency.  Musculoskeletal:  Positive for arthralgias (Right hip). Negative for back pain, myalgias and neck pain.  Neurological:  Negative for dizziness and headaches.   Hematological:  Does not bruise/bleed easily.  Psychiatric/Behavioral:  The patient is not nervous/anxious.   Blood pressure (!) 143/89, pulse 90, temperature 100.3 F (37.9 C), temperature source Oral, resp. rate (!) 22, height '5\' 3"'$  (1.6 m), weight 60.3 kg, SpO2 94 %. Physical Exam Constitutional:      General: She is not in acute distress.    Appearance: She is well-developed. She is not diaphoretic.  HENT:     Head: Normocephalic and atraumatic.  Eyes:     General: No scleral icterus.       Right eye: No discharge.        Left eye: No discharge.     Conjunctiva/sclera: Conjunctivae normal.  Cardiovascular:     Rate and Rhythm: Normal rate and regular rhythm.  Pulmonary:     Effort: Pulmonary effort is normal. No respiratory distress.  Musculoskeletal:     Cervical back: Normal range of motion.     Comments: RLE No traumatic wounds, ecchymosis, or rash  Mild TTP hip  No knee or ankle effusion  Knee stable to varus/ valgus and anterior/posterior stress  Sens DPN, SPN, TN intact  Motor EHL, ext, flex, evers 5/5  DP 1+, PT 1+, No significant edema  Skin:    General: Skin is warm and dry.  Neurological:     Mental Status: She is alert.  Psychiatric:        Mood and Affect: Mood normal.        Behavior: Behavior normal.    Assessment/Plan: Right hip fx -- Plan hip hemi tomorrow by Dr. Lyla Glassing. Please keep NPO after MN.    Lisette Abu, PA-C Orthopedic Surgery [phone] 08/29/2020, 9:03 AM

## 2020-08-29 NOTE — ED Notes (Signed)
Pt given crackers and water. 

## 2020-08-29 NOTE — ED Notes (Signed)
Pt placed on 3L 02 per [facility]

## 2020-08-30 ENCOUNTER — Encounter (HOSPITAL_COMMUNITY)
Admission: EM | Disposition: A | Discharge status: Home / Self Care | Payer: Self-pay | Source: Home / Self Care | Attending: Internal Medicine

## 2020-08-30 ENCOUNTER — Encounter (HOSPITAL_COMMUNITY): Payer: Self-pay | Admitting: Family Medicine

## 2020-08-30 ENCOUNTER — Inpatient Hospital Stay (HOSPITAL_COMMUNITY): Payer: Medicare HMO | Admitting: Anesthesiology

## 2020-08-30 ENCOUNTER — Inpatient Hospital Stay (HOSPITAL_COMMUNITY): Payer: Medicare HMO

## 2020-08-30 HISTORY — PX: TOTAL HIP ARTHROPLASTY: SHX124

## 2020-08-30 LAB — COMPREHENSIVE METABOLIC PANEL
ALT: 21 U/L (ref 0–44)
AST: 34 U/L (ref 15–41)
Albumin: 3.7 g/dL (ref 3.5–5.0)
Alkaline Phosphatase: 75 U/L (ref 38–126)
Anion gap: 9 (ref 5–15)
BUN: 15 mg/dL (ref 8–23)
CO2: 26 mmol/L (ref 22–32)
Calcium: 9.3 mg/dL (ref 8.9–10.3)
Chloride: 93 mmol/L — ABNORMAL LOW (ref 98–111)
Creatinine, Ser: 1.17 mg/dL — ABNORMAL HIGH (ref 0.44–1.00)
GFR, Estimated: 42 mL/min — ABNORMAL LOW (ref >60)
Glucose, Bld: 120 mg/dL — ABNORMAL HIGH (ref 70–99)
Potassium: 3.6 mmol/L (ref 3.5–5.1)
Sodium: 128 mmol/L — ABNORMAL LOW (ref 135–145)
Total Bilirubin: 1.9 mg/dL — ABNORMAL HIGH (ref 0.3–1.2)
Total Protein: 6.4 g/dL — ABNORMAL LOW (ref 6.5–8.1)

## 2020-08-30 LAB — CBC WITH DIFFERENTIAL/PLATELET
Abs Immature Granulocytes: 0.05 10*3/uL (ref 0.00–0.07)
Basophils Absolute: 0 10*3/uL (ref 0.0–0.1)
Basophils Relative: 0 %
Eosinophils Absolute: 0.1 10*3/uL (ref 0.0–0.5)
Eosinophils Relative: 1 %
HCT: 34.2 % — ABNORMAL LOW (ref 36.0–46.0)
Hemoglobin: 11.3 g/dL — ABNORMAL LOW (ref 12.0–15.0)
Immature Granulocytes: 1 %
Lymphocytes Relative: 9 %
Lymphs Abs: 0.9 10*3/uL (ref 0.7–4.0)
MCH: 28.9 pg (ref 26.0–34.0)
MCHC: 33 g/dL (ref 30.0–36.0)
MCV: 87.5 fL (ref 80.0–100.0)
Monocytes Absolute: 0.5 10*3/uL (ref 0.1–1.0)
Monocytes Relative: 5 %
Neutro Abs: 8.8 10*3/uL — ABNORMAL HIGH (ref 1.7–7.7)
Neutrophils Relative %: 84 %
Platelets: 127 10*3/uL — ABNORMAL LOW (ref 150–400)
RBC: 3.91 MIL/uL (ref 3.87–5.11)
RDW: 14.6 % (ref 11.5–15.5)
WBC: 10.3 10*3/uL (ref 4.0–10.5)
nRBC: 0 % (ref 0.0–0.2)

## 2020-08-30 LAB — SURGICAL PCR SCREEN
MRSA, PCR: NEGATIVE
Staphylococcus aureus: NEGATIVE

## 2020-08-30 LAB — MAGNESIUM: Magnesium: 1.7 mg/dL (ref 1.7–2.4)

## 2020-08-30 LAB — PHOSPHORUS: Phosphorus: 3.4 mg/dL (ref 2.5–4.6)

## 2020-08-30 LAB — ABO/RH: ABO/RH(D): O NEG

## 2020-08-30 LAB — PREPARE RBC (CROSSMATCH)

## 2020-08-30 SURGERY — ARTHROPLASTY, HIP, TOTAL, ANTERIOR APPROACH
Anesthesia: General | Site: Hip | Laterality: Right

## 2020-08-30 MED ORDER — SODIUM CHLORIDE 0.9% IV SOLUTION: Freq: Once | INTRAVENOUS | Status: DC

## 2020-08-30 MED ORDER — PHENYLEPHRINE 40 MCG/ML (10ML) SYRINGE FOR IV PUSH (FOR BLOOD PRESSURE SUPPORT)
PREFILLED_SYRINGE | INTRAVENOUS | Status: AC
  Filled 2020-08-30: qty 10

## 2020-08-30 MED ORDER — PROPOFOL 10 MG/ML IV BOLUS
INTRAVENOUS | Status: AC
  Filled 2020-08-30: qty 20

## 2020-08-30 MED ORDER — METOCLOPRAMIDE HCL 5 MG/ML IJ SOLN: 5.0000 mg | Freq: Three times a day (TID) | INTRAMUSCULAR | Status: DC | PRN

## 2020-08-30 MED ORDER — OXYCODONE HCL 5 MG PO TABS: 5.0000 mg | ORAL_TABLET | Freq: Once | ORAL | Status: DC | PRN

## 2020-08-30 MED ORDER — PHENYLEPHRINE 40 MCG/ML (10ML) SYRINGE FOR IV PUSH (FOR BLOOD PRESSURE SUPPORT)
PREFILLED_SYRINGE | INTRAVENOUS | Status: DC | PRN
  Administered 2020-08-30 (×3): 80 ug via INTRAVENOUS

## 2020-08-30 MED ORDER — CEFAZOLIN SODIUM-DEXTROSE 2-4 GM/100ML-% IV SOLN: 2.0000 g | Freq: Four times a day (QID) | INTRAVENOUS | Status: DC

## 2020-08-30 MED ORDER — SENNA 8.6 MG PO TABS
1.0000 | ORAL_TABLET | Freq: Two times a day (BID) | ORAL | Status: DC
  Administered 2020-08-30 – 2020-09-02 (×6): 8.6 mg via ORAL
  Filled 2020-08-30 (×6): qty 1

## 2020-08-30 MED ORDER — CHLORHEXIDINE GLUCONATE CLOTH 2 % EX PADS
6.0000 | MEDICATED_PAD | Freq: Every day | CUTANEOUS | Status: DC
  Administered 2020-08-30 – 2020-09-05 (×5): 6 via TOPICAL

## 2020-08-30 MED ORDER — PHENYLEPHRINE HCL-NACL 20-0.9 MG/250ML-% IV SOLN: INTRAVENOUS | Status: DC | PRN

## 2020-08-30 MED ORDER — PROPOFOL 10 MG/ML IV BOLUS
INTRAVENOUS | Status: DC | PRN
  Administered 2020-08-30: 40 mg via INTRAVENOUS
  Administered 2020-08-30: 70 mg via INTRAVENOUS

## 2020-08-30 MED ORDER — FENTANYL CITRATE (PF) 250 MCG/5ML IJ SOLN
INTRAMUSCULAR | Status: DC | PRN
  Administered 2020-08-30 (×2): 50 ug via INTRAVENOUS

## 2020-08-30 MED ORDER — MENTHOL 3 MG MT LOZG: 1.0000 | LOZENGE | OROMUCOSAL | Status: DC | PRN

## 2020-08-30 MED ORDER — ONDANSETRON HCL 4 MG/2ML IJ SOLN: 4.0000 mg | Freq: Once | INTRAMUSCULAR | Status: DC | PRN

## 2020-08-30 MED ORDER — PHENOL 1.4 % MT LIQD: 1.0000 | OROMUCOSAL | Status: DC | PRN

## 2020-08-30 MED ORDER — METOCLOPRAMIDE HCL 5 MG PO TABS: 5.0000 mg | ORAL_TABLET | Freq: Three times a day (TID) | ORAL | Status: DC | PRN

## 2020-08-30 MED ORDER — ONDANSETRON HCL 4 MG/2ML IJ SOLN
INTRAMUSCULAR | Status: DC | PRN
  Administered 2020-08-30: 4 mg via INTRAVENOUS

## 2020-08-30 MED ORDER — ROCURONIUM BROMIDE 10 MG/ML (PF) SYRINGE
PREFILLED_SYRINGE | INTRAVENOUS | Status: AC
  Filled 2020-08-30: qty 10

## 2020-08-30 MED ORDER — ONDANSETRON HCL 4 MG/2ML IJ SOLN: 4.0000 mg | Freq: Four times a day (QID) | INTRAMUSCULAR | Status: DC | PRN

## 2020-08-30 MED ORDER — SUGAMMADEX SODIUM 200 MG/2ML IV SOLN
INTRAVENOUS | Status: DC | PRN
  Administered 2020-08-30: 150 mg via INTRAVENOUS

## 2020-08-30 MED ORDER — BUPIVACAINE-EPINEPHRINE (PF) 0.5% -1:200000 IJ SOLN
INTRAMUSCULAR | Status: DC | PRN
  Administered 2020-08-30: 30 mL via PERINEURAL

## 2020-08-30 MED ORDER — LIDOCAINE 2% (20 MG/ML) 5 ML SYRINGE
INTRAMUSCULAR | Status: AC
  Filled 2020-08-30: qty 5

## 2020-08-30 MED ORDER — SODIUM CHLORIDE (PF) 0.9 % IJ SOLN
INTRAMUSCULAR | Status: DC | PRN
  Administered 2020-08-30: 30 mL via INTRAVENOUS

## 2020-08-30 MED ORDER — DEXAMETHASONE SODIUM PHOSPHATE 10 MG/ML IJ SOLN
INTRAMUSCULAR | Status: DC | PRN
  Administered 2020-08-30: 5 mg via INTRAVENOUS

## 2020-08-30 MED ORDER — ASPIRIN 81 MG PO CHEW
81.0000 mg | CHEWABLE_TABLET | Freq: Two times a day (BID) | ORAL | Status: DC
  Administered 2020-08-31 – 2020-09-05 (×11): 81 mg via ORAL
  Filled 2020-08-30 (×11): qty 1

## 2020-08-30 MED ORDER — ONDANSETRON HCL 4 MG/2ML IJ SOLN
INTRAMUSCULAR | Status: AC
  Filled 2020-08-30: qty 2

## 2020-08-30 MED ORDER — MAGNESIUM SULFATE 2 GM/50ML IV SOLN
2.0000 g | Freq: Once | INTRAVENOUS | Status: AC
  Administered 2020-08-30: 2 g via INTRAVENOUS
  Filled 2020-08-30: qty 50

## 2020-08-30 MED ORDER — ROCURONIUM BROMIDE 10 MG/ML (PF) SYRINGE
PREFILLED_SYRINGE | INTRAVENOUS | Status: DC | PRN
  Administered 2020-08-30: 50 mg via INTRAVENOUS

## 2020-08-30 MED ORDER — BUPIVACAINE-EPINEPHRINE 0.5% -1:200000 IJ SOLN
INTRAMUSCULAR | Status: AC
  Filled 2020-08-30: qty 1

## 2020-08-30 MED ORDER — 0.9 % SODIUM CHLORIDE (POUR BTL) OPTIME
TOPICAL | Status: DC | PRN
  Administered 2020-08-30: 1000 mL

## 2020-08-30 MED ORDER — KETOROLAC TROMETHAMINE 30 MG/ML IJ SOLN
INTRAMUSCULAR | Status: DC | PRN
  Administered 2020-08-30: 30 mg via INTRAVENOUS

## 2020-08-30 MED ORDER — LIDOCAINE 2% (20 MG/ML) 5 ML SYRINGE
INTRAMUSCULAR | Status: DC | PRN
  Administered 2020-08-30: 60 mg via INTRAVENOUS

## 2020-08-30 MED ORDER — ONDANSETRON HCL 4 MG PO TABS: 4.0000 mg | ORAL_TABLET | Freq: Four times a day (QID) | ORAL | Status: DC | PRN

## 2020-08-30 MED ORDER — CLOPIDOGREL BISULFATE 75 MG PO TABS
75.0000 mg | ORAL_TABLET | Freq: Every day | ORAL | Status: DC
  Administered 2020-08-31 – 2020-09-05 (×6): 75 mg via ORAL
  Filled 2020-08-30 (×6): qty 1

## 2020-08-30 MED ORDER — FENTANYL CITRATE (PF) 100 MCG/2ML IJ SOLN: 25.0000 ug | INTRAMUSCULAR | Status: DC | PRN

## 2020-08-30 MED ORDER — SODIUM CHLORIDE 0.9 % IR SOLN
Status: DC | PRN
  Administered 2020-08-30: 1000 mL

## 2020-08-30 MED ORDER — OXYCODONE HCL 5 MG/5ML PO SOLN: 5.0000 mg | Freq: Once | ORAL | Status: DC | PRN

## 2020-08-30 MED ORDER — CEFAZOLIN SODIUM-DEXTROSE 2-4 GM/100ML-% IV SOLN
2.0000 g | Freq: Two times a day (BID) | INTRAVENOUS | Status: AC
  Administered 2020-08-30: 2 g via INTRAVENOUS
  Filled 2020-08-30: qty 100

## 2020-08-30 MED ORDER — DEXAMETHASONE SODIUM PHOSPHATE 10 MG/ML IJ SOLN
INTRAMUSCULAR | Status: AC
  Filled 2020-08-30: qty 1

## 2020-08-30 MED ORDER — CHLORHEXIDINE GLUCONATE CLOTH 2 % EX PADS: 6.0000 | MEDICATED_PAD | Freq: Every day | CUTANEOUS | Status: DC

## 2020-08-30 MED ORDER — TRAMADOL HCL 50 MG PO TABS
50.0000 mg | ORAL_TABLET | Freq: Four times a day (QID) | ORAL | Status: DC | PRN
Start: 2020-08-30 — End: 2020-09-05
  Administered 2020-08-30 – 2020-09-05 (×7): 50 mg via ORAL
  Filled 2020-08-30 (×8): qty 1

## 2020-08-30 MED ORDER — KETOROLAC TROMETHAMINE 30 MG/ML IJ SOLN
INTRAMUSCULAR | Status: AC
  Filled 2020-08-30: qty 1

## 2020-08-30 MED ORDER — LACTATED RINGERS IV SOLN: INTRAVENOUS | Status: DC | PRN

## 2020-08-30 MED ORDER — DOCUSATE SODIUM 100 MG PO CAPS
100.0000 mg | ORAL_CAPSULE | Freq: Two times a day (BID) | ORAL | Status: DC
  Administered 2020-08-30 – 2020-09-02 (×6): 100 mg via ORAL
  Filled 2020-08-30 (×6): qty 1

## 2020-08-30 MED ORDER — FENTANYL CITRATE (PF) 250 MCG/5ML IJ SOLN
INTRAMUSCULAR | Status: AC
  Filled 2020-08-30: qty 5

## 2020-08-30 MED ORDER — TRANEXAMIC ACID-NACL 1000-0.7 MG/100ML-% IV SOLN
1000.0000 mg | Freq: Once | INTRAVENOUS | Status: DC
Start: 2020-08-30 — End: 2020-09-05
  Filled 2020-08-30: qty 100

## 2020-08-30 MED ORDER — PROPOFOL 500 MG/50ML IV EMUL
INTRAVENOUS | Status: DC | PRN
Start: 2020-08-30 — End: 2020-08-30
  Administered 2020-08-30: 25 ug/kg/min via INTRAVENOUS

## 2020-08-30 SURGICAL SUPPLY — 65 items
ALCOHOL 70% 16 OZ (MISCELLANEOUS) ×2 IMPLANT
BAG COUNTER SPONGE SURGICOUNT (BAG) ×2 IMPLANT
BLADE CLIPPER SURG (BLADE) IMPLANT
CHLORAPREP W/TINT 26 (MISCELLANEOUS) ×2 IMPLANT
COVER SURGICAL LIGHT HANDLE (MISCELLANEOUS) ×2 IMPLANT
DERMABOND ADVANCED (GAUZE/BANDAGES/DRESSINGS) ×2
DERMABOND ADVANCED .7 DNX12 (GAUZE/BANDAGES/DRESSINGS) ×2 IMPLANT
DRAPE C-ARM 42X72 X-RAY (DRAPES) ×2 IMPLANT
DRAPE STERI IOBAN 125X83 (DRAPES) ×2 IMPLANT
DRAPE U-SHAPE 47X51 STRL (DRAPES) ×6 IMPLANT
DRESSING AQUACEL AG SP 3.5X10 (GAUZE/BANDAGES/DRESSINGS) IMPLANT
DRSG AQUACEL AG ADV 3.5X10 (GAUZE/BANDAGES/DRESSINGS) ×2 IMPLANT
DRSG AQUACEL AG SP 3.5X10 (GAUZE/BANDAGES/DRESSINGS) ×2
ELECT BLADE 4.0 EZ CLEAN MEGAD (MISCELLANEOUS) ×2
ELECT PENCIL ROCKER SW 15FT (MISCELLANEOUS) ×2 IMPLANT
ELECT REM PT RETURN 9FT ADLT (ELECTROSURGICAL) ×2
ELECTRODE BLDE 4.0 EZ CLN MEGD (MISCELLANEOUS) ×1 IMPLANT
ELECTRODE REM PT RTRN 9FT ADLT (ELECTROSURGICAL) ×1 IMPLANT
EVACUATOR 1/8 PVC DRAIN (DRAIN) IMPLANT
GLOVE SURG ENC MOIS LTX SZ8.5 (GLOVE) ×4 IMPLANT
GLOVE SURG ENC TEXT LTX SZ7 (GLOVE) ×2 IMPLANT
GLOVE SURG UNDER POLY LF SZ7.5 (GLOVE) ×2 IMPLANT
GLOVE SURG UNDER POLY LF SZ8.5 (GLOVE) ×2 IMPLANT
GOWN STRL REUS W/ TWL LRG LVL3 (GOWN DISPOSABLE) ×2 IMPLANT
GOWN STRL REUS W/ TWL XL LVL3 (GOWN DISPOSABLE) ×1 IMPLANT
GOWN STRL REUS W/TWL 2XL LVL3 (GOWN DISPOSABLE) ×2 IMPLANT
GOWN STRL REUS W/TWL LRG LVL3 (GOWN DISPOSABLE) ×4
GOWN STRL REUS W/TWL XL LVL3 (GOWN DISPOSABLE) ×2
HANDPIECE INTERPULSE COAX TIP (DISPOSABLE) ×2
HEAD FEM UNIPOLAR 46 OD STRL (Hips) ×1 IMPLANT
HOOD PEEL AWAY FACE SHEILD DIS (HOOD) ×4 IMPLANT
JET LAVAGE IRRISEPT WOUND (IRRIGATION / IRRIGATOR) ×2
KIT BASIN OR (CUSTOM PROCEDURE TRAY) ×2 IMPLANT
KIT TURNOVER KIT B (KITS) ×2 IMPLANT
LAVAGE JET IRRISEPT WOUND (IRRIGATION / IRRIGATOR) ×1 IMPLANT
MANIFOLD NEPTUNE II (INSTRUMENTS) ×2 IMPLANT
MARKER SKIN DUAL TIP RULER LAB (MISCELLANEOUS) ×4 IMPLANT
NDL SPNL 18GX3.5 QUINCKE PK (NEEDLE) ×1 IMPLANT
NEEDLE SPNL 18GX3.5 QUINCKE PK (NEEDLE) ×2 IMPLANT
NS IRRIG 1000ML POUR BTL (IV SOLUTION) ×2 IMPLANT
PACK TOTAL JOINT (CUSTOM PROCEDURE TRAY) ×2 IMPLANT
PACK UNIVERSAL I (CUSTOM PROCEDURE TRAY) ×2 IMPLANT
PAD ARMBOARD 7.5X6 YLW CONV (MISCELLANEOUS) ×4 IMPLANT
SAW OSC TIP CART 19.5X105X1.3 (SAW) ×2 IMPLANT
SEALER BIPOLAR AQUA 6.0 (INSTRUMENTS) IMPLANT
SET HNDPC FAN SPRY TIP SCT (DISPOSABLE) ×1 IMPLANT
SOL PREP POV-IOD 4OZ 10% (MISCELLANEOUS) ×2 IMPLANT
SPACER FEM TAPERED +0 12/14 (Hips) ×1 IMPLANT
STEM TRI LOC BPS SZ4 W GRIPTON (Hips) IMPLANT
SUT ETHIBOND NAB CT1 #1 30IN (SUTURE) ×4 IMPLANT
SUT MNCRL AB 3-0 PS2 18 (SUTURE) ×2 IMPLANT
SUT MON AB 2-0 CT1 36 (SUTURE) ×2 IMPLANT
SUT VIC AB 1 CT1 27 (SUTURE) ×2
SUT VIC AB 1 CT1 27XBRD ANBCTR (SUTURE) ×1 IMPLANT
SUT VIC AB 2-0 CT1 27 (SUTURE) ×2
SUT VIC AB 2-0 CT1 TAPERPNT 27 (SUTURE) ×1 IMPLANT
SUT VLOC 180 0 24IN GS25 (SUTURE) ×2 IMPLANT
SYR 50ML LL SCALE MARK (SYRINGE) ×2 IMPLANT
TOWEL GREEN STERILE (TOWEL DISPOSABLE) ×2 IMPLANT
TOWEL GREEN STERILE FF (TOWEL DISPOSABLE) ×2 IMPLANT
TRAY CATH 16FR W/PLASTIC CATH (SET/KITS/TRAYS/PACK) IMPLANT
TRAY FOLEY W/BAG SLVR 16FR (SET/KITS/TRAYS/PACK)
TRAY FOLEY W/BAG SLVR 16FR ST (SET/KITS/TRAYS/PACK) IMPLANT
TRI LOC BPS SZ 4 W GRIPTON (Hips) ×2 IMPLANT
WATER STERILE IRR 1000ML POUR (IV SOLUTION) ×6 IMPLANT

## 2020-08-30 NOTE — Anesthesia Procedure Notes (Signed)
Procedure Name: Intubation Date/Time: 08/30/2020 9:06 AM Performed by: Reece Agar, CRNA Pre-anesthesia Checklist: Patient identified, Emergency Drugs available, Suction available and Patient being monitored Patient Re-evaluated:Patient Re-evaluated prior to induction Oxygen Delivery Method: Circle System Utilized Preoxygenation: Pre-oxygenation with 100% oxygen Induction Type: IV induction Ventilation: Mask ventilation without difficulty Laryngoscope Size: Mac and 3 Grade View: Grade I Tube type: Oral Tube size: 7.0 mm Number of attempts: 1 Airway Equipment and Method: Stylet Placement Confirmation: ETT inserted through vocal cords under direct vision, positive ETCO2 and breath sounds checked- equal and bilateral Secured at: 21 cm Tube secured with: Tape Dental Injury: Teeth and Oropharynx as per pre-operative assessment

## 2020-08-30 NOTE — Anesthesia Postprocedure Evaluation (Signed)
Anesthesia Post Note  Patient: Sheri Hensley  Procedure(s) Performed: HEMI  HIP ARTHROPLASTY ANTERIOR APPROACH (Right: Hip)     Patient location during evaluation: PACU Anesthesia Type: General Level of consciousness: awake and alert Pain management: pain level controlled Vital Signs Assessment: post-procedure vital signs reviewed and stable Respiratory status: spontaneous breathing, nonlabored ventilation, respiratory function stable and patient connected to nasal cannula oxygen Cardiovascular status: blood pressure returned to baseline and stable Postop Assessment: no apparent nausea or vomiting Anesthetic complications: no   No notable events documented.  Last Vitals:  Vitals:   08/30/20 1120 08/30/20 1135  BP:  135/83  Pulse:  100  Resp:  (!) 21  Temp: 36.7 C   SpO2:  99%    Last Pain:  Vitals:   08/30/20 1120  TempSrc:   PainSc: 0-No pain                 Audry Pili

## 2020-08-30 NOTE — Progress Notes (Signed)
PHARMACY NOTE:  ANTIMICROBIAL RENAL DOSAGE ADJUSTMENT  Current antimicrobial regimen includes a mismatch between antimicrobial dosage and estimated renal function.  As per policy approved by the Pharmacy & Therapeutics and Medical Executive Committees, the antimicrobial dosage will be adjusted accordingly.  Current antimicrobial dosage:  Cefazolin 2 gm IV Q 6 hrs X 2 doses post op  Indication:  Surgical prophylaxis  Renal Function:  Estimated Creatinine Clearance: 22.2 mL/min (A) (by C-G formula based on SCr of 1.17 mg/dL (H)). '[]'$      On intermittent HD, scheduled: '[]'$      On CRRT    Antimicrobial dosage has been changed to:  Cefazolin 2 gm IV Q 12 hrs X 1 dose post op  Thank you for allowing pharmacy to be a part of this patient's care.  Gillermina Hu, PharmD, BCPS, [facility] Clinical Pharmacist 08/30/2020 6:39 PM

## 2020-08-30 NOTE — Op Note (Signed)
OPERATIVE REPORT  SURGEON: Rod Can, MD   ASSISTANT: Staff.  PREOPERATIVE DIAGNOSIS: Displaced Right femoral neck fracture.   POSTOPERATIVE DIAGNOSIS: Displaced Right femoral neck fracture.   PROCEDURE: Right hip hemiarthroplasty, anterior approach.   IMPLANTS: DePuy Tri Lock stem, size 4, hi offset, with a +0 mm spacer and a 46 mm monopolar head ball.  ANESTHESIA:  General  ANTIBIOTICS: 2g ancef.  ESTIMATED BLOOD LOSS:-50 mL    DRAINS: None.  COMPLICATIONS: None   CONDITION: PACU - hemodynamically stable.   BRIEF CLINICAL NOTE: Sheri Hensley is a 87 y.o. female with a displaced Right femoral neck fracture. The patient was admitted to the hospitalist service and underwent perioperative risk stratification and medical optimization. The risks, benefits, and alternatives to hemiarthroplasty were explained, and the patient elected to proceed.  PROCEDURE IN DETAIL: The patient was taken to the operating room and general anesthesia was induced on the hospital bed.  The patient was then positioned on the Hana table.  All bony prominences were well padded.  The hip was prepped and draped in the normal sterile surgical fashion.  A time-out was called verifying side and site of surgery. Antibiotics were given within 60 minutes of beginning the procedure.   Bikini incision was made, and te direct anterior approach to the hip was performed through the Hueter interval.  Lateral femoral circumflex vessels were treated with the Auqumantys. The anterior capsule was exposed and an inverted T capsulotomy was made.  Fracture hematoma was encountered and evacuated. The patient was found to have a comminuted Right subcapital femoral neck fracture.  I freshened the femoral neck cut with a saw.  I removed the femoral neck fragment.  A corkscrew was placed into the head and the head was removed.  This was passed to the back table and was measured. The pubofemoral ligament was released  subperiosteally to the lesser trochanter.   Acetabular exposure was achieved.  I examined the articular cartilage which was intact.  The labrum was intact. A 46 mm trial head was placed and found to have excellent fit.   I then gained femoral exposure taking care to protect the abductors and greater trochanter.  This was performed using standard external rotation, extension, and adduction.  The superior capsule was incised, taking care to stay lateral to the posterior border of the femoral neck. A cookie cutter was used to enter the femoral canal, and then the femoral canal finder was used to confirm location.  I then sequentially broached up to a size 4.  Calcar planer was used on the femoral neck remnant.  I paced a hi neck and a 36 + 1.5 head ball. The hip was reduced.  Leg lengths were checked fluoroscopically.  The hip was dislocated and trial components were removed.  I placed the real stem followed by the real spacer and head ball.  A single reduction maneuver was performed and the hip was reduced.  Fluoroscopy was used to confirm component position and leg lengths.  At 90 degrees of external rotation and extension, the hip was stable to an anterior directed force.   The wound was copiously irrigated with Irrisept solution and normal saline using pule lavage.  Marcaine solution was injected into the periarticular soft tissue.  The wound was closed in layers using #1 Vicryl and V-Loc for the fascia, 2-0 Vicryl for the subcutaneous fat, 2-0 Monocryl for the deep dermal layer, and staples and glue for the skin.  Once the glue was fully dried,  an Aquacell Ag dressing was applied.  The patient was then awakened from anesthesia and transported to the recovery room in stable condition.  Sponge, needle, and instrument counts were correct at the end of the case x2.  The patient tolerated the procedure well and there were no known complications.  Please note that a surgical assistant was a medical necessity for  this procedure to perform it in a safe and expeditious manner. Assistant was necessary to provide appropriate retraction of vital neurovascular structures, to prevent femoral fracture, and to allow for anatomic placement of the prosthesis.

## 2020-08-30 NOTE — Progress Notes (Signed)
PROGRESS NOTE    Sheri Hensley  [id number] DOB: Dec 08, 1919 DOA: 08/28/2020 PCP: Josetta Huddle, MD   Brief Narrative:   The patient is a 87 year old Caucasian female with a past medical history significant for but not limited to proximal atrial fibrillation on antiplatelets with Plavix as well as other comorbidities presented via EMS after a fall at her home.  Patient states that she is getting up along her back door before she went to bed as she normally does and she lost her balance and fell landing on her right side.  She reported pain in her right hip and was unable to stand.  She also had the back of the right side of her head on the floor but did not lose consciousness.  She had her cell phone with her and call 911.  EMS placed her in a c-collar and brought to emergency room.  She is not hypoxic and did not have any complaints of chest pain.  She did report having a mild headache in the back of her head but no visual changes.  She denies any numbness of her extremities or face.  Patient was alert and oriented and she does take Plavix and amiodarone for proximal atrial fibrillation and is very independent lives alone.  She was admitted for a closed right hip fracture and orthopedics was consulted.  Pain control was initiated and the plan is for a right hip Hemi anterior approach by Dr. Lyla Glassing on 08/30/20.  Assessment & Plan:   Principal Problem:   Closed right hip fracture (HCC) Active Problems:   AKI (acute kidney injury) ([place])   AF (paroxysmal atrial fibrillation) (HCC)   Hyponatremia    Closed right hip fracture  -Sheri Hensley has right hip fracture.  -Orthopedics has been consulted and she was made NPO after midnight per Ortho request. Now is s/p Right Hip Hemiarthroplasty from the Anterior Approach -Pain control with dilaudid 0.5 mg IV every 2 as needed for moderate pain -Bedrest.  -Will need PT evaluation and case management consult after ortho evaluates pt to arrange  for rehab needs.    AKI (acute kidney injury) -IVF hydration with LR at 75 ml/hr to be continued -Patient's BUN/Cr is trending down and went from 18/1.30 -> 16/1.25 -> 15 -Continue to hold her losartan/hydrochlorothiazide combination pill -Recheck renal function and electrolytes in am   AF (paroxysmal atrial fibrillation)  -Has history of paroxysmal atrial fibrillation per daughter.  Is on amiodarone per daughter and have resumed Amiodarone 100 mg po EOD -She is on Plavix which is being held.  She is not on DOAC or Coumadin secondary to fall risks -Continue to Monitor    Hyponatremia -C/w LR IVF hydration. Recheck electrolytes in am -Na+ went from 129 -> 130 -> 130 -> 128 -Continue to Monitor and Trend -Repeat CMP in the AM  Normocytic Anemia -Patient's Hgb/Hct went from 10.9/33.4 -> 10.9/32.0 -> 11.3/34.2 -Check Anemia Panel in the AM -Continue to Monitor for S/Sx of Bleeding; No overt bleeding noted -Repeat CBC in the AM    GERD -C/w PPI with Pantoprazole 40 mg every Morning    HLD -C/w Atorvastatin 10 mg po Daily   Confusion and Agitation -Post-Operatively and likely an effect of Anesthesia  -Delirium Precautions   Depression and Anxiety -C/w Sertraline 50 mg po Daily   HTN -Currently Holding Amlodipine 2.5 mg po Daily and Losartan-HCTZ 1 tab po Daily  -Continue to Monitor BP per Protocol -Last BP was   Hyperbilirubinemia -Likely  Reactive -T Bili went from 1.1 -> 1.9 -Continue to Monitor and Trend -Repeat CMP in the AM    DVT prophylaxis: SCDs Code Status: FULL CODE Family Communication: No family present at bedside  Disposition Plan: Pending further clinical improvement and evaluation by PT/OT  Status is: Inpatient  Remains inpatient appropriate because:Unsafe d/c plan, IV treatments appropriate due to intensity of illness or inability to take PO, and Inpatient level of care appropriate due to severity of illness  Dispo: The patient is from: Home               Anticipated d/c is to:  TBD              Patient currently is not medically stable to d/c.   Difficult to place patient No  Consultants:  Orthopedic Surgery   Procedures: Right hip hemiarthroplasty, anterior approach  Antimicrobials:  Anti-infectives (From admission, onward)    Start     Dose/Rate Route Frequency Ordered Stop   08/30/20 0600  ceFAZolin (ANCEF) IVPB 2g/100 mL premix        2 g 200 mL/hr over 30 Minutes Intravenous On call to O.R. 08/29/20 2008 08/30/20 0920        Subjective: Seen and examined at bedside and she is little somnolent and just waking up from her anesthesia.  Denies any pain currently.  No chest pain or shortness of breath.  Feels okay.  Later on she became confused when she came to the floor.  No other concerns or complaints this time.  Objective: Vitals:   08/30/20 1055 08/30/20 1105 08/30/20 1120 08/30/20 1135  BP: (!) 178/81 (!) 162/79 (!) 159/67 135/83  Pulse: 95 95 98 100  Resp: (!) 21 (!) 27 (!) 21 (!) 21  Temp:   98 F (36.7 C)   TempSrc:      SpO2: (!) 87% 98% 97% 99%  Weight:      Height:        Intake/Output Summary (Last 24 hours) at 08/30/2020 1231 Last data filed at 08/30/2020 1040 Gross per 24 hour  Intake 2704.16 ml  Output 702 ml  Net 2002.16 ml   Filed Weights   08/28/20 2203  Weight: 60.3 kg   Examination: Physical Exam:  Constitutional: WN/WD elderly Caucasian female currently in NAD and appears calm and comfortable Eyes: Lids and conjunctivae normal, sclerae anicteric  ENMT: External Ears, Nose appear normal. Grossly normal hearing.  Neck: Appears normal, supple, no cervical masses, normal ROM, no appreciable thyromegaly; no JVD Respiratory: Diminished to auscultation bilaterally, no wheezing, rales, rhonchi or crackles. Normal respiratory effort and patient is not tachypenic. No accessory muscle use.  Unlabored breathing but wearing supplemental Cardiovascular: RRR, no murmurs / rubs / gallops. S1 and S2  auscultated. No extremity edema.  Abdomen: Soft, non-tender, non-distended. Bowel sounds positive.  GU: Deferred. Musculoskeletal: No clubbing / cyanosis of digits/nails. No joint deformity upper and lower extremities.  Skin: No rashes, lesions, ulcers on limited skin evaluation. No induration; Warm and dry.  Neurologic: CN 2-12 grossly intact with no focal deficits. Romberg sign and cerebellar reflexes not assessed.  Psychiatric: Impaired judgment and insight.  A little somnolent and drowsy she is not fully oriented x 3. Normal mood and appropriate affect.   Data Reviewed: I have personally reviewed following labs and imaging studies  CBC: Recent Labs  Lab 08/28/20 2209 08/28/20 2213 08/30/20 0309  WBC 7.7  --  10.3  NEUTROABS  --   --  8.8*  HGB 10.9* 10.9* 11.3*  HCT 33.4* 32.0* 34.2*  MCV 89.1  --  87.5  PLT 130*  --  [id number]   Basic Metabolic Panel: Recent Labs  Lab 08/28/20 2209 08/28/20 2213 08/29/20 0500 08/30/20 0309  NA 129* 130* 130* 128*  K 3.8 3.8 4.0 3.6  CL 93* 95* 95* 93*  CO2 24  --  23 26  GLUCOSE 149* 148* 112* 120*  BUN '17 18 16 15  '$ CREATININE 1.32* 1.30* 1.25* 1.17*  CALCIUM 9.0  --  9.6 9.3  MG  --   --   --  1.7  PHOS  --   --   --  3.4   GFR: Estimated Creatinine Clearance: 22.2 mL/min (A) (by C-G formula based on SCr of 1.17 mg/dL (H)). Liver Function Tests: Recent Labs  Lab 08/28/20 2209 08/30/20 0309  AST 29 34  ALT 19 21  ALKPHOS 78 75  BILITOT 1.1 1.9*  PROT 6.2* 6.4*  ALBUMIN 4.0 3.7   No results for input(s): LIPASE, AMYLASE in the last 168 hours. No results for input(s): AMMONIA in the last 168 hours. Coagulation Profile: Recent Labs  Lab 08/28/20 2209  INR 1.1   Cardiac Enzymes: No results for input(s): CKTOTAL, CKMB, CKMBINDEX, TROPONINI in the last 168 hours. BNP (last 3 results) No results for input(s): PROBNP in the last 8760 hours. HbA1C: No results for input(s): HGBA1C in the last 72 hours. CBG: No results for  input(s): GLUCAP in the last 168 hours. Lipid Profile: No results for input(s): CHOL, HDL, LDLCALC, TRIG, CHOLHDL, LDLDIRECT in the last 72 hours. Thyroid Function Tests: No results for input(s): TSH, T4TOTAL, FREET4, T3FREE, THYROIDAB in the last 72 hours. Anemia Panel: No results for input(s): VITAMINB12, FOLATE, FERRITIN, TIBC, IRON, RETICCTPCT in the last 72 hours. Sepsis Labs: Recent Labs  Lab 08/28/20 2209  LATICACIDVEN 2.2*    Recent Results (from the past 240 hour(s))  Resp Panel by RT-PCR (Flu A&B, Covid) Nasopharyngeal Swab     Status: None   Collection Time: 08/28/20 10:37 PM   Specimen: Nasopharyngeal Swab; Nasopharyngeal(NP) swabs in vial transport medium  Result Value Ref Range Status   SARS Coronavirus 2 by RT PCR NEGATIVE NEGATIVE Final    Comment: (NOTE) SARS-CoV-2 target nucleic acids are NOT DETECTED.  The SARS-CoV-2 RNA is generally detectable in upper respiratory specimens during the acute phase of infection. The lowest concentration of SARS-CoV-2 viral copies this assay can detect is 138 copies/mL. A negative result does not preclude SARS-Cov-2 infection and should not be used as the sole basis for treatment or other patient management decisions. A negative result may occur with  improper specimen collection/handling, submission of specimen other than nasopharyngeal swab, presence of viral mutation(s) within the areas targeted by this assay, and inadequate number of viral copies(<138 copies/mL). A negative result must be combined with clinical observations, patient history, and epidemiological information. The expected result is Negative.  Fact Sheet for Patients:  EntrepreneurPulse.com.au  Fact Sheet for Healthcare Providers:  IncredibleEmployment.be  This test is no t yet approved or cleared by the [place] FDA and  has been authorized for detection and/or diagnosis of SARS-CoV-2 by FDA under an Emergency Use  Authorization (EUA). This EUA will remain  in effect (meaning this test can be used) for the duration of the COVID-19 declaration under Section 564(b)(1) of the Act, 21 U.S.C.section 360bbb-3(b)(1), unless the authorization is terminated  or revoked sooner.       Influenza A by PCR  NEGATIVE NEGATIVE Final   Influenza B by PCR NEGATIVE NEGATIVE Final    Comment: (NOTE) The Xpert Xpress SARS-CoV-2/FLU/RSV plus assay is intended as an aid in the diagnosis of influenza from Nasopharyngeal swab specimens and should not be used as a sole basis for treatment. Nasal washings and aspirates are unacceptable for Xpert Xpress SARS-CoV-2/FLU/RSV testing.  Fact Sheet for Patients: EntrepreneurPulse.com.au  Fact Sheet for Healthcare Providers: IncredibleEmployment.be  This test is not yet approved or cleared by the [place] FDA and has been authorized for detection and/or diagnosis of SARS-CoV-2 by FDA under an Emergency Use Authorization (EUA). This EUA will remain in effect (meaning this test can be used) for the duration of the COVID-19 declaration under Section 564(b)(1) of the Act, 21 U.S.C. section 360bbb-3(b)(1), unless the authorization is terminated or revoked.  Performed at [facility], [place] [address], [place], [place] [address]   Surgical PCR screen     Status: None   Collection Time: 08/29/20 10:59 PM   Specimen: Nasal Mucosa; Nasal Swab  Result Value Ref Range Status   MRSA, PCR NEGATIVE NEGATIVE Final   Staphylococcus aureus NEGATIVE NEGATIVE Final    Comment: (NOTE) The Xpert SA Assay (FDA approved for NASAL specimens in patients 77 years of age and older), is one component of a comprehensive surveillance program. It is not intended to diagnose infection nor to guide or monitor treatment. Performed at [facility], [place] [address], [place], [place] [address]     RN Pressure Injury Documentation:     Estimated body  mass index is 23.56 kg/m as calculated from the following:   Height as of this encounter: '5\' 3"'$  (1.6 m).   Weight as of this encounter: 60.3 kg.  Malnutrition Type:   Malnutrition Characteristics:   Nutrition Interventions:     Radiology Studies: CT HEAD WO CONTRAST (5MM)  Result Date: 08/28/2020 CLINICAL DATA:  Fall EXAM: CT HEAD WITHOUT CONTRAST CT CERVICAL SPINE WITHOUT CONTRAST TECHNIQUE: Multidetector CT imaging of the head and cervical spine was performed following the standard protocol without intravenous contrast. Multiplanar CT image reconstructions of the cervical spine were also generated. COMPARISON:  CT head, maxillofacial and C-spine 07/01/2017 MR head 12/23/2015 FINDINGS: CT HEAD FINDINGS Brain: No evidence of acute infarction, hemorrhage, hydrocephalus, extra-axial collection, visible mass lesion or mass effect. Symmetric prominence of the ventricles, cisterns and sulci compatible with parenchymal volume loss. Patchy areas of white matter hypoattenuation are most compatible with chronic microvascular angiopathy. Chronic dural calcifications. Midline structures are unremarkable. Vascular: Atherosclerotic calcification of the carotid siphons and intradural vertebral arteries. No hyperdense vessel. Skull: Right parietal scalp swelling and small laceration with thin crescentic scalp hematoma measuring up to 7 mm in maximal thickness. No subjacent calvarial fracture, sutural diastasis or other acute or traumatic osseous injury. No suspicious osseous lesions. Additional partly calcified subcutaneous nodule of the left high parietal scalp (4/69) most compatible with a benign trichilemmal cyst. Sinuses/Orbits: Paranasal sinuses and mastoid air cells are predominantly clear. Orbital structures are unremarkable aside from prior lens extractions. Other: Bilateral TMJ arthrosis. CT CERVICAL SPINE FINDINGS Alignment: Stabilization collar in place at the time of exam. Straightening of the normal  cervical lordosis. Focal reversal at the C5 level with retrolisthesis C5 on C6 of approximately 3 mm. Minimal 1 mm of anterolisthesis C3 on C4. Overall, these findings are not significantly changed from comparison prior and favored to be on a chronic degenerative basis. Stable appearance of the facets at this level. No  evidence of traumatic listhesis. Bony fusion across the C2-3 articular facets bilaterally as well as on the left C4-5. No abnormally widened, perched or jumped facets. Normal alignment of the craniocervical and atlantoaxial articulations. Skull base and vertebrae: The osseous structures appear diffusely demineralized which may limit detection of small or nondisplaced fractures. No acute skull base fracture. No vertebral body fracture or height loss. No worrisome osseous lesions. Essentially complete effacement of the atlantodental interval with extensive arthrosis and additional degenerative ossicles/fragmented osteophytes at the dens, stable from prior with calcific pannus formation. Multilevel cervical spondylitic changes, further detailed below. Soft tissues and spinal canal: No pre or paravertebral fluid or swelling. No visible canal hematoma. Airways patent. No conspicuous cervical adenopathy. Cervical carotid atherosclerosis. No acute traumatic abnormality of the cervical spine. Disc levels: Multilevel intervertebral disc height loss with spondylitic endplate changes. Features most pronounced C5-6 where a combination of retrolisthesis and disc osteophyte complex with ligamentum flavum hypertrophy result in moderate to severe canal stenosis at this level. Central disc osteophyte complexes C3-4, C6-7 also resulting in some mild canal narrowing with additional effacement of the ventral thecal sac at the remaining cervical levels. Multilevel uncinate spurring and facet hypertrophic changes are present throughout the cervical spine with mild to moderate multilevel neural foraminal narrowing and more  moderate to severe narrowing bilaterally C3-4, C5-6. Upper chest: Biapical areas of pleuroparenchymal scarring. Some atelectatic changes are present. Mild interlobular septal thickening is noted as well, could reflect early developing edema. Calcification of the proximal great vessels. Other: 1.6 cm hypoattenuating nodule in the right lobe thyroid gland. In the setting of significant comorbidities or limited life expectancy, no follow-up recommended (ref: J Am Coll Radiol. 2015 Feb;12(2): 143-50). IMPRESSION: 1. Right parietal scalp thickening and laceration with crescentic scalp hematoma. No calvarial fracture. 2. No acute intracranial abnormality. 3. Background of microvascular angiopathy, parenchymal volume loss and intracranial and cervical atherosclerosis. 4. No acute fracture or traumatic listhesis of the cervical spine. 5. Multilevel cervical spondylitic changes, most pronounced C5-6 where there is severe canal stenosis and moderate to severe bilateral foraminal narrowing. Additional mild canal narrowing C3-4 with moderate to severe foraminal stenosis. Additional changes as above. 6. Biapical areas of pleuroparenchymal scarring with superimposed septal thickening, could reflect developing interstitial edema in the appropriate clinical setting. Electronically Signed   By: Lovena Le M.D.   On: 08/28/2020 22:52   CT Cervical Spine Wo Contrast  Result Date: 08/28/2020 CLINICAL DATA:  Fall EXAM: CT HEAD WITHOUT CONTRAST CT CERVICAL SPINE WITHOUT CONTRAST TECHNIQUE: Multidetector CT imaging of the head and cervical spine was performed following the standard protocol without intravenous contrast. Multiplanar CT image reconstructions of the cervical spine were also generated. COMPARISON:  CT head, maxillofacial and C-spine 07/01/2017 MR head 12/23/2015 FINDINGS: CT HEAD FINDINGS Brain: No evidence of acute infarction, hemorrhage, hydrocephalus, extra-axial collection, visible mass lesion or mass effect.  Symmetric prominence of the ventricles, cisterns and sulci compatible with parenchymal volume loss. Patchy areas of white matter hypoattenuation are most compatible with chronic microvascular angiopathy. Chronic dural calcifications. Midline structures are unremarkable. Vascular: Atherosclerotic calcification of the carotid siphons and intradural vertebral arteries. No hyperdense vessel. Skull: Right parietal scalp swelling and small laceration with thin crescentic scalp hematoma measuring up to 7 mm in maximal thickness. No subjacent calvarial fracture, sutural diastasis or other acute or traumatic osseous injury. No suspicious osseous lesions. Additional partly calcified subcutaneous nodule of the left high parietal scalp (4/69) most compatible with a benign trichilemmal cyst. Sinuses/Orbits: Paranasal sinuses  and mastoid air cells are predominantly clear. Orbital structures are unremarkable aside from prior lens extractions. Other: Bilateral TMJ arthrosis. CT CERVICAL SPINE FINDINGS Alignment: Stabilization collar in place at the time of exam. Straightening of the normal cervical lordosis. Focal reversal at the C5 level with retrolisthesis C5 on C6 of approximately 3 mm. Minimal 1 mm of anterolisthesis C3 on C4. Overall, these findings are not significantly changed from comparison prior and favored to be on a chronic degenerative basis. Stable appearance of the facets at this level. No evidence of traumatic listhesis. Bony fusion across the C2-3 articular facets bilaterally as well as on the left C4-5. No abnormally widened, perched or jumped facets. Normal alignment of the craniocervical and atlantoaxial articulations. Skull base and vertebrae: The osseous structures appear diffusely demineralized which may limit detection of small or nondisplaced fractures. No acute skull base fracture. No vertebral body fracture or height loss. No worrisome osseous lesions. Essentially complete effacement of the atlantodental  interval with extensive arthrosis and additional degenerative ossicles/fragmented osteophytes at the dens, stable from prior with calcific pannus formation. Multilevel cervical spondylitic changes, further detailed below. Soft tissues and spinal canal: No pre or paravertebral fluid or swelling. No visible canal hematoma. Airways patent. No conspicuous cervical adenopathy. Cervical carotid atherosclerosis. No acute traumatic abnormality of the cervical spine. Disc levels: Multilevel intervertebral disc height loss with spondylitic endplate changes. Features most pronounced C5-6 where a combination of retrolisthesis and disc osteophyte complex with ligamentum flavum hypertrophy result in moderate to severe canal stenosis at this level. Central disc osteophyte complexes C3-4, C6-7 also resulting in some mild canal narrowing with additional effacement of the ventral thecal sac at the remaining cervical levels. Multilevel uncinate spurring and facet hypertrophic changes are present throughout the cervical spine with mild to moderate multilevel neural foraminal narrowing and more moderate to severe narrowing bilaterally C3-4, C5-6. Upper chest: Biapical areas of pleuroparenchymal scarring. Some atelectatic changes are present. Mild interlobular septal thickening is noted as well, could reflect early developing edema. Calcification of the proximal great vessels. Other: 1.6 cm hypoattenuating nodule in the right lobe thyroid gland. In the setting of significant comorbidities or limited life expectancy, no follow-up recommended (ref: J Am Coll Radiol. 2015 Feb;12(2): 143-50). IMPRESSION: 1. Right parietal scalp thickening and laceration with crescentic scalp hematoma. No calvarial fracture. 2. No acute intracranial abnormality. 3. Background of microvascular angiopathy, parenchymal volume loss and intracranial and cervical atherosclerosis. 4. No acute fracture or traumatic listhesis of the cervical spine. 5. Multilevel  cervical spondylitic changes, most pronounced C5-6 where there is severe canal stenosis and moderate to severe bilateral foraminal narrowing. Additional mild canal narrowing C3-4 with moderate to severe foraminal stenosis. Additional changes as above. 6. Biapical areas of pleuroparenchymal scarring with superimposed septal thickening, could reflect developing interstitial edema in the appropriate clinical setting. Electronically Signed   By: Lovena Le M.D.   On: 08/28/2020 22:52   DG Chest Port 1 View  Result Date: 08/28/2020 CLINICAL DATA:  Fall EXAM: PORTABLE CHEST 1 VIEW COMPARISON:  07/15/2020 FINDINGS: Mild cardiomegaly. No confluent airspace disease or effusion. Biapical scarring. No pneumothorax. No acute bony abnormality. IMPRESSION: No active disease. Electronically Signed   By: Rolm Baptise M.D.   On: 08/28/2020 22:20   DG Knee Right Port  Result Date: 08/29/2020 CLINICAL DATA:  Right femoral neck fracture. EXAM: PORTABLE RIGHT KNEE - 1-2 VIEW COMPARISON:  None. FINDINGS: No evidence of fracture, dislocation, or joint effusion. Severe degenerative changes seen involving the lateral joint  space. Mild degenerative changes seen involving the medial joint space. Moderate degenerative changes seen involving the patellofemoral space with osteophyte formation. Vascular calcifications are noted. IMPRESSION: Tricompartmental degenerative joint disease. No acute abnormality seen. Electronically Signed   By: Marijo Conception M.D.   On: 08/29/2020 11:35   DG Hip Unilat W or Wo Pelvis 2-3 Views Right  Result Date: 08/28/2020 CLINICAL DATA:  Fall EXAM: DG HIP (WITH OR WITHOUT PELVIS) 2-3V RIGHT COMPARISON:  None. FINDINGS: There is a right femoral neck fracture with varus angulation. No subluxation or dislocation. Mild degenerative changes in the hips. IMPRESSION: Right femoral neck fracture with varus angulation. Electronically Signed   By: Rolm Baptise M.D.   On: 08/28/2020 22:20    Scheduled Meds:   sodium chloride   Intravenous Once   [MAR Hold] amiodarone  100 mg Oral QODAY   [MAR Hold] atorvastatin  10 mg Oral Daily   [MAR Hold] brimonidine  1 drop Both Eyes BID   [MAR Hold] Chlorhexidine Gluconate Cloth  6 each Topical Daily   [MAR Hold] cholecalciferol  2,000 Units Oral Daily   [MAR Hold] isosorbide mononitrate  10 mg Oral BID   [MAR Hold] latanoprost  1 drop Both Eyes QHS   [MAR Hold] mupirocin ointment  1 application Nasal BID   [MAR Hold] pantoprazole  40 mg Oral q morning   povidone-iodine  2 application Topical Once   povidone-iodine  2 application Topical Once   [MAR Hold] sertraline  50 mg Oral Daily   [MAR Hold] sucralfate  1 g Oral BID   [MAR Hold] vitamin B-12  1,000 mcg Oral Daily   Continuous Infusions:  lactated ringers 400 mL/hr at 08/30/20 0845    LOS: 2 days   Kerney Elbe, DO [facility] PAGER is on AMION  If 7PM-7AM, please contact night-coverage [email or web address]

## 2020-08-30 NOTE — Progress Notes (Signed)
Patient returned to room 5n13. Patient extremely confused and combative. Receiving RN at bedside.Patient throwing punches and trying to get out of bed. Patients daughter called to update postop status. Daughter stated she would be returning to bedside. Bed remains in low position with side rail up. Safety mittens applied. Oxygen at 2L in use with O2 sats remaining 98%. Unable to obtain B/P due to the combative nature of the patient. Myself and a nurse tech remained at bedside for safety reasons. Rodman Pickle RN to notify MD.

## 2020-08-30 NOTE — Transfer of Care (Signed)
Immediate Anesthesia Transfer of Care Note  Patient: Sheri Hensley  Procedure(s) Performed: HEMI  HIP ARTHROPLASTY ANTERIOR APPROACH (Right: Hip)  Patient Location: PACU  Anesthesia Type:General  Level of Consciousness: awake  Airway & Oxygen Therapy: Patient Spontanous Breathing and Patient connected to face mask oxygen  Post-op Assessment: Report given to RN and Post -op Vital signs reviewed and stable  Post vital signs: Reviewed and stable  Last Vitals:  Vitals Value Taken Time  BP 178/81 08/30/20 1053  Temp    Pulse 94 08/30/20 1057  Resp 17 08/30/20 1057  SpO2 86 % 08/30/20 1057  Vitals shown include unvalidated device data.  Last Pain:  Vitals:   08/30/20 0534  TempSrc: Oral  PainSc:          Complications: No notable events documented.

## 2020-08-30 NOTE — Progress Notes (Signed)
Patient arrived from PACU and she was confused and agitated. Mitts were placed to prevent the patient from scratching staff. The daughter was called to update on pt's status and she is now at the bedside. Will call MD to update on new agitation.

## 2020-08-30 NOTE — Interval H&P Note (Signed)
History and Physical Interval Note:  08/30/2020 8:55 AM  Sheri Hensley  has presented today for surgery, with the diagnosis of Right Hip Fx.  The various methods of treatment have been discussed with the patient and family. After consideration of risks, benefits and other options for treatment, the patient has consented to  Procedure(s): HEMI  HIP ARTHROPLASTY ANTERIOR APPROACH (Right) as a surgical intervention.  The patient's history has been reviewed, patient examined, no change in status, stable for surgery.  I have reviewed the patient's chart and labs.  Questions were answered to the patient's satisfaction.    The risks, benefits, and alternatives were discussed with the patient. There are risks associated with the surgery including, but not limited to, problems with anesthesia (death), infection, instability (giving out of the joint), dislocation, differences in leg length/angulation/rotation, fracture of bones, loosening or failure of implants, hematoma (blood accumulation) which may require surgical drainage, blood clots, pulmonary embolism, nerve injury (foot drop and lateral thigh numbness), and blood vessel injury. The patient understands these risks and elects to proceed.    Hilton Cork Marenda Accardi

## 2020-08-30 NOTE — Discharge Instructions (Signed)
? ?Dr. Zykiria Bruening ?Joint Replacement Specialist ?[facility] ?[address] ?[place], [place] [address] ?[phone] ? ? ?TOTAL HIP REPLACEMENT POSTOPERATIVE DIRECTIONS ? ? ? ?Hip Rehabilitation, Guidelines Following Surgery  ? ?WEIGHT BEARING ?Weight bearing as tolerated with assist device (walker, cane, etc) as directed, use it as long as suggested by your surgeon or therapist, typically at least 4-6 weeks. ? ?The results of a hip operation are greatly improved after range of motion and muscle strengthening exercises. Follow all safety measures which are given to protect your hip. If any of these exercises cause increased pain or swelling in your joint, decrease the amount until you are comfortable again. Then slowly increase the exercises. Call your caregiver if you have problems or questions.  ? ?HOME CARE INSTRUCTIONS  ?Most of the following instructions are designed to prevent the dislocation of your new hip.  ?Remove items at home which could result in a fall. This includes throw rugs or furniture in walking pathways.  ?Continue medications as instructed at time of discharge. ?You may have some home medications which will be placed on hold until you complete the course of blood thinner medication. ?You may start showering once you are discharged home. Do not remove your dressing. ?Do not put on socks or shoes without following the instructions of your caregivers.   ?Sit on chairs with arms. Use the chair arms to help push yourself up when arising.  ?Arrange for the use of a toilet seat elevator so you are not sitting low.  ?Walk with walker as instructed.  ?You may resume a sexual relationship in one month or when given the OK by your caregiver.  ?Use walker as long as suggested by your caregivers.  ?You may put full weight on your legs and walk as much as is comfortable. ?Avoid periods of inactivity such as sitting longer than an hour when not asleep. This helps prevent blood  clots.  ?You may return to work once you are cleared by your surgeon.  ?Do not drive a car for 6 weeks or until released by your surgeon.  ?Do not drive while taking narcotics.  ?Wear elastic stockings for two weeks following surgery during the day but you may remove then at night.  ?Make sure you keep all of your appointments after your operation with all of your doctors and caregivers. You should call the office at the above phone number and make an appointment for approximately two weeks after the date of your surgery. ?Please pick up a stool softener and laxative for home use as long as you are requiring pain medications. ?ICE to the affected hip every three hours for 30 minutes at a time and then as needed for pain and swelling. Continue to use ice on the hip for pain and swelling from surgery. You may notice swelling that will progress down to the foot and ankle.  This is normal after surgery.  Elevate the leg when you are not up walking on it.   ?It is important for you to complete the blood thinner medication as prescribed by your doctor. ?Continue to use the breathing machine which will help keep your temperature down.  It is common for your temperature to cycle up and down following surgery, especially at night when you are not up moving around and exerting yourself.  The breathing machine keeps your lungs expanded and your temperature down. ? ?RANGE OF MOTION AND STRENGTHENING EXERCISES  ?These exercises are designed to help you   keep full movement of your hip joint. Follow your caregiver's or physical therapist's instructions. Perform all exercises about fifteen times, three times per day or as directed. Exercise both hips, even if you have had only one joint replacement. These exercises can be done on a training (exercise) mat, on the floor, on a table or on a bed. Use whatever works the best and is most comfortable for you. Use music or television while you are exercising so that the exercises are a  pleasant break in your day. This will make your life better with the exercises acting as a break in routine you can look forward to.  ?Lying on your back, slowly slide your foot toward your buttocks, raising your knee up off the floor. Then slowly slide your foot back down until your leg is straight again.  ?Lying on your back spread your legs as far apart as you can without causing discomfort.  ?Lying on your side, raise your upper leg and foot straight up from the floor as far as is comfortable. Slowly lower the leg and repeat.  ?Lying on your back, tighten up the muscle in the front of your thigh (quadriceps muscles). You can do this by keeping your leg straight and trying to raise your heel off the floor. This helps strengthen the largest muscle supporting your knee.  ?Lying on your back, tighten up the muscles of your buttocks both with the legs straight and with the knee bent at a comfortable angle while keeping your heel on the floor.  ? ?SKILLED REHAB INSTRUCTIONS: ?If the patient is transferred to a skilled rehab facility following release from the hospital, a list of the current medications will be sent to the facility for the patient to continue.  When discharged from the skilled rehab facility, please have the facility set up the patient's Home Health Physical Therapy prior to being released. Also, the skilled facility will be responsible for providing the patient with their medications at time of release from the facility to include their pain medication and their blood thinner medication. If the patient is still at the rehab facility at time of the two week follow up appointment, the skilled rehab facility will also need to assist the patient in arranging follow up appointment in our office and any transportation needs. ? ?POST-OPERATIVE OPIOID TAPER INSTRUCTIONS: ?It is important to wean off of your opioid medication as soon as possible. If you do not need pain medication after your surgery it is ok  to stop day one. ?Opioids include: ?Codeine, Hydrocodone(Norco, Vicodin), Oxycodone(Percocet, oxycontin) and hydromorphone amongst others.  ?Long term and even short term use of opiods can cause: ?Increased pain response ?Dependence ?Constipation ?Depression ?Respiratory depression ?And more.  ?Withdrawal symptoms can include ?Flu like symptoms ?Nausea, vomiting ?And more ?Techniques to manage these symptoms ?Hydrate well ?Eat regular healthy meals ?Stay active ?Use relaxation techniques(deep breathing, meditating, yoga) ?Do Not substitute Alcohol to help with tapering ?If you have been on opioids for less than two weeks and do not have pain than it is ok to stop all together.  ?Plan to wean off of opioids ?This plan should start within one week post op of your joint replacement. ?Maintain the same interval or time between taking each dose and first decrease the dose.  ?Cut the total daily intake of opioids by one tablet each day ?Next start to increase the time between doses. ?The last dose that should be eliminated is the evening dose.  ? ? ?MAKE   SURE YOU:  ?Understand these instructions.  ?Will watch your condition.  ?Will get help right away if you are not doing well or get worse. ? ?Pick up stool softner and laxative for home use following surgery while on pain medications. ?Do not remove your dressing. ?The dressing is waterproof--it is OK to take showers. ?Continue to use ice for pain and swelling after surgery. ?Do not use any lotions or creams on the incision until instructed by your surgeon. ?Total Hip Protocol. ? ?

## 2020-08-31 DIAGNOSIS — D62 Acute posthemorrhagic anemia: Secondary | ICD-10-CM

## 2020-08-31 DIAGNOSIS — R410 Disorientation, unspecified: Secondary | ICD-10-CM

## 2020-08-31 LAB — CBC WITH DIFFERENTIAL/PLATELET
Abs Immature Granulocytes: 0.07 10*3/uL (ref 0.00–0.07)
Basophils Absolute: 0 10*3/uL (ref 0.0–0.1)
Basophils Relative: 0 %
Eosinophils Absolute: 0 10*3/uL (ref 0.0–0.5)
Eosinophils Relative: 0 %
HCT: 29.3 % — ABNORMAL LOW (ref 36.0–46.0)
Hemoglobin: 9.9 g/dL — ABNORMAL LOW (ref 12.0–15.0)
Immature Granulocytes: 1 %
Lymphocytes Relative: 5 %
Lymphs Abs: 0.5 10*3/uL — ABNORMAL LOW (ref 0.7–4.0)
MCH: 29.7 pg (ref 26.0–34.0)
MCHC: 33.8 g/dL (ref 30.0–36.0)
MCV: 88 fL (ref 80.0–100.0)
Monocytes Absolute: 0.7 10*3/uL (ref 0.1–1.0)
Monocytes Relative: 6 %
Neutro Abs: 9 10*3/uL — ABNORMAL HIGH (ref 1.7–7.7)
Neutrophils Relative %: 88 %
Platelets: 121 10*3/uL — ABNORMAL LOW (ref 150–400)
RBC: 3.33 MIL/uL — ABNORMAL LOW (ref 3.87–5.11)
RDW: 14.5 % (ref 11.5–15.5)
WBC: 10.2 10*3/uL (ref 4.0–10.5)
nRBC: 0 % (ref 0.0–0.2)

## 2020-08-31 LAB — COMPREHENSIVE METABOLIC PANEL
ALT: 19 U/L (ref 0–44)
AST: 54 U/L — ABNORMAL HIGH (ref 15–41)
Albumin: 3.1 g/dL — ABNORMAL LOW (ref 3.5–5.0)
Alkaline Phosphatase: 57 U/L (ref 38–126)
Anion gap: 12 (ref 5–15)
BUN: 22 mg/dL (ref 8–23)
CO2: 19 mmol/L — ABNORMAL LOW (ref 22–32)
Calcium: 8.5 mg/dL — ABNORMAL LOW (ref 8.9–10.3)
Chloride: 97 mmol/L — ABNORMAL LOW (ref 98–111)
Creatinine, Ser: 1.27 mg/dL — ABNORMAL HIGH (ref 0.44–1.00)
GFR, Estimated: 38 mL/min — ABNORMAL LOW (ref >60)
Glucose, Bld: 105 mg/dL — ABNORMAL HIGH (ref 70–99)
Potassium: 4.1 mmol/L (ref 3.5–5.1)
Sodium: 128 mmol/L — ABNORMAL LOW (ref 135–145)
Total Bilirubin: 1.4 mg/dL — ABNORMAL HIGH (ref 0.3–1.2)
Total Protein: 5.6 g/dL — ABNORMAL LOW (ref 6.5–8.1)

## 2020-08-31 LAB — PHOSPHORUS: Phosphorus: 2.6 mg/dL (ref 2.5–4.6)

## 2020-08-31 LAB — MAGNESIUM: Magnesium: 1.8 mg/dL (ref 1.7–2.4)

## 2020-08-31 MED ORDER — MAGNESIUM SULFATE 2 GM/50ML IV SOLN
2.0000 g | Freq: Once | INTRAVENOUS | Status: AC
  Administered 2020-08-31: 2 g via INTRAVENOUS
  Filled 2020-08-31: qty 50

## 2020-08-31 MED ORDER — ACETAMINOPHEN 325 MG PO TABS
650.0000 mg | ORAL_TABLET | Freq: Four times a day (QID) | ORAL | Status: DC | PRN
  Administered 2020-08-31 – 2020-09-04 (×4): 650 mg via ORAL
  Filled 2020-08-31 (×4): qty 2

## 2020-08-31 NOTE — Progress Notes (Signed)
PROGRESS NOTE    Sheri Hensley  [id number] DOB: Mar 25, 1919 DOA: 08/28/2020 PCP: Josetta Huddle, MD   Brief Narrative:   The patient is a 87 year old Caucasian female with a past medical history significant for but not limited to proximal atrial fibrillation on antiplatelets with Plavix as well as other comorbidities presented via EMS after a fall at her home.  Patient states that she is getting up along her back door before she went to bed as she normally does and she lost her balance and fell landing on her right side.  She reported pain in her right hip and was unable to stand.  She also had the back of the right side of her head on the floor but did not lose consciousness.  She had her cell phone with her and call 911.  EMS placed her in a c-collar and brought to emergency room.  She is not hypoxic and did not have any complaints of chest pain.  She did report having a mild headache in the back of her head but no visual changes.  She denies any numbness of her extremities or face.  Patient was alert and oriented and she does take Plavix and amiodarone for proximal atrial fibrillation and is very independent lives alone.  She was admitted for a closed right hip fracture and orthopedics was consulted.  Pain control was initiated and the plan is for a right hip Hemi anterior approach by Dr. Lyla Glassing on 08/30/20. She is POD1 and still having some confusion/delirium. Blood count has dropped a little so will need to continue to Monitor closely   Assessment & Plan:   Principal Problem:   Closed right hip fracture (HCC) Active Problems:   AKI (acute kidney injury) ([place])   AF (paroxysmal atrial fibrillation) (HCC)   Hyponatremia    Closed right hip fracture s/p Right Hip Hemiarthroplasty, Anterior Approach POD 1  -Ms. Johannsen has right hip fracture.  -Orthopedics has been consulted and she was made NPO after midnight per Ortho request. Now is s/p Right Hip Hemiarthroplasty from the Anterior  Approach -Pain control with dilaudid 0.5 mg IV every 2 as needed for moderate pain as added but Will stop this and add Acetaminophen 650 mg po q5hprn Mild Pain and C/w Tramadol 50 mg po q6hprn Moderate Pain -Bowl Regimen with Senna 8.6 mg po BID  -C/w Supportive Care with Ondansetron 4 mg po/IV q6hprn Nausae and Metaclopramide 5 mg po/IV q8hprn if Ondansetron is ineffective  -Will need PT evaluation and case management consult after ortho evaluates pt to arrange for rehab needs.  -Orthopedic surgery recommending up with therapy advancing diet and weightbearing as tolerated -DVT prophylaxis they have recommended aspirin 81 mg p.o. twice daily, SCDs and teds   AKI (acute kidney injury) vs CKD Stage 3b Metabolic Acidosis -IVF hydration with LR at 75 ml/hr to be continued -Patient's BUN/Cr is trending down and went from 18/1.30 -> 16/1.25 -> 15/1.17 -> 22/1.27 -Patient has a Metabolic Acidosis with a CO2 of 19, AG of 12, and Chloride Level of 97 -Continue to hold her losartan/hydrochlorothiazide combination pill -C/w IVF  -Recheck renal function and electrolytes in am -Avoid nephrotoxic medications, contrast dyes, hypotension and renally dose medications -Repeat CMP in a.m.   AF (paroxysmal atrial fibrillation)  -Has history of paroxysmal atrial fibrillation per daughter.  Is on amiodarone per daughter and have resumed Amiodarone 100 mg po EOD -She is on Plavix which is being held for surgery but now resumed today but  may need to hold further if she continues to bleed.  She is not on DOAC or Coumadin secondary to fall risks -Continue to Monitor    Hyponatremia -C/w LR IVF hydration. Recheck electrolytes in am -Na+ went from 129 -> 130 -> 130 -> 128 -> 128 -Continue to Monitor and Trend -Repeat CMP in the AM  Post-Operative Acute Blood Loss Anemia superimposed on Normocytic Anemia  -Patient's Hgb/Hct went from 10.9/33.4 -> 10.9/32.0 -> 11.3/34.2  -> 9.9/29.3 -Check Anemia Panel in the  AM -Continue to Monitor for S/Sx of Bleeding; No overt bleeding noted -Repeat CBC in the AM   Thrombocytopenia -Patient's Platelet Count went from 130 -> 127 -> 121 -Continue to monitor for S/Sx of Bleeding; No overt Bleeding noted -Repeat CBC in the AM    GERD -C/w PPI with Pantoprazole 40 mg every Morning  -C/w Sucralfate 1 gram po BID     HLD -C/w Atorvastatin 10 mg po Daily   Confusion and Agitation/ Delirium -Post-Operatively and likely an effect of Anesthesia  -Delirium Precautions -Minimize Narcotics and stop Hydromorphone -Continue to Reorient and if persists may pursue Head Imaging -Check TSH, RPR, B12    Depression and Anxiety -C/w Sertraline 50 mg po Daily   HTN -Currently Holding Amlodipine 2.5 mg po Daily and Losartan-HCTZ 1 tab po Daily  -Continue to Monitor BP per Protocol -Last BP was 127/57  Hyperbilirubinemia, improving  -Likely Reactive -T Bili went from 1.1 -> 1.9 -> 1.4 -Continue to Monitor and Trend -Repeat CMP in the AM    DVT prophylaxis: SCDs Code Status: FULL CODE Family Communication: Discussed with Daughter Jocelyn Lamer at bedside  Disposition Plan: Pending further clinical improvement and evaluation by PT/OT  Status is: Inpatient  Remains inpatient appropriate because:Unsafe d/c plan, IV treatments appropriate due to intensity of illness or inability to take PO, and Inpatient level of care appropriate due to severity of illness  Dispo: The patient is from: Home              Anticipated d/c is to:  TBD              Patient currently is not medically stable to d/c.   Difficult to place patient No  Consultants:  Orthopedic Surgery   Procedures: Right hip hemiarthroplasty, anterior approach  Antimicrobials:  Anti-infectives (From admission, onward)    Start     Dose/Rate Route Frequency Ordered Stop   08/30/20 2100  ceFAZolin (ANCEF) IVPB 2g/100 mL premix        2 g 200 mL/hr over 30 Minutes Intravenous Every 12 hours 08/30/20 1839  08/30/20 2219   08/30/20 1900  ceFAZolin (ANCEF) IVPB 2g/100 mL premix  Status:  Discontinued        2 g 200 mL/hr over 30 Minutes Intravenous Every 6 hours 08/30/20 1807 08/30/20 1838   08/30/20 0600  ceFAZolin (ANCEF) IVPB 2g/100 mL premix        2 g 200 mL/hr over 30 Minutes Intravenous On call to O.R. 08/29/20 2008 08/30/20 0920        Subjective: Seen and examined at bedside was more awake but still very confused.  Asked me if I was "canvassing the area."  Denies any pain currently.  No nausea or vomiting.  Denies any other concerns or complaints at this time and was able to stand up twice with therapy today.  Objective: Vitals:   08/30/20 1105 08/30/20 1120 08/30/20 1135 08/30/20 2043  BP: (!) 162/79 (!) 159/67 135/83 (!) 114/57  Pulse: 95 98 100 100  Resp: (!) 27 (!) 21 (!) 21 17  Temp:  98 F (36.7 C)  98.8 F (37.1 C)  TempSrc:    Oral  SpO2: 98% 97% 99% 93%  Weight:      Height:        Intake/Output Summary (Last 24 hours) at 08/31/2020 0851 Last data filed at 08/31/2020 0559 Gross per 24 hour  Intake 1100 ml  Output 900 ml  Net 200 ml    Filed Weights   08/28/20 2203  Weight: 60.3 kg   Examination: Physical Exam:  Constitutional: WN/WD thin elderly Caucasian female in NAD and appears calm but is confused Eyes: Lids and conjunctivae normal, sclerae anicteric  ENMT: External Ears, Nose appear normal. Grossly normal hearing.  Neck: Appears normal, supple, no cervical masses, normal ROM, no appreciable thyromegaly; no JVD Respiratory: Diminished to auscultation bilaterally, no wheezing, rales, rhonchi or crackles. Normal respiratory effort and patient is not tachypenic. No accessory muscle use. Unlabored breathing  Cardiovascular: RRR, no murmurs / rubs / gallops. S1 and S2 auscultated.  Abdomen: Soft, non-tender, non-distended. No masses palpated. No appreciable hepatosplenomegaly. Bowel sounds positive.  GU: Deferred. Musculoskeletal: No clubbing / cyanosis  of digits/nails. No joint deformity upper and lower extremities.  Skin: No rashes, lesions, ulcers. No induration; Warm and dry.  Neurologic: CN 2-12 grossly intact with no focal deficits. Romberg sign and cerebellar reflexes not assessed.  Psychiatric: Impaired judgment and insight. Alert and oriented x 1. Still has a confused mood and appropriate affect.   Data Reviewed: I have personally reviewed following labs and imaging studies  CBC: Recent Labs  Lab 08/28/20 2209 08/28/20 2213 08/30/20 0309 08/31/20 0121  WBC 7.7  --  10.3 10.2  NEUTROABS  --   --  8.8* 9.0*  HGB 10.9* 10.9* 11.3* 9.9*  HCT 33.4* 32.0* 34.2* 29.3*  MCV 89.1  --  87.5 88.0  PLT 130*  --  127* 121*    Basic Metabolic Panel: Recent Labs  Lab 08/28/20 2209 08/28/20 2213 08/29/20 0500 08/30/20 0309 08/31/20 0121  NA 129* 130* 130* 128* 128*  K 3.8 3.8 4.0 3.6 4.1  CL 93* 95* 95* 93* 97*  CO2 24  --  23 26 19*  GLUCOSE 149* 148* 112* 120* 105*  BUN '17 18 16 15 22  '$ CREATININE 1.32* 1.30* 1.25* 1.17* 1.27*  CALCIUM 9.0  --  9.6 9.3 8.5*  MG  --   --   --  1.7 1.8  PHOS  --   --   --  3.4 2.6    GFR: Estimated Creatinine Clearance: 20.5 mL/min (A) (by C-G formula based on SCr of 1.27 mg/dL (H)). Liver Function Tests: Recent Labs  Lab 08/28/20 2209 08/30/20 0309 08/31/20 0121  AST 29 34 54*  ALT '19 21 19  '$ ALKPHOS 78 75 57  BILITOT 1.1 1.9* 1.4*  PROT 6.2* 6.4* 5.6*  ALBUMIN 4.0 3.7 3.1*    No results for input(s): LIPASE, AMYLASE in the last 168 hours. No results for input(s): AMMONIA in the last 168 hours. Coagulation Profile: Recent Labs  Lab 08/28/20 2209  INR 1.1    Cardiac Enzymes: No results for input(s): CKTOTAL, CKMB, CKMBINDEX, TROPONINI in the last 168 hours. BNP (last 3 results) No results for input(s): PROBNP in the last 8760 hours. HbA1C: No results for input(s): HGBA1C in the last 72 hours. CBG: No results for input(s): GLUCAP in the last 168 hours. Lipid  Profile: No  results for input(s): CHOL, HDL, LDLCALC, TRIG, CHOLHDL, LDLDIRECT in the last 72 hours. Thyroid Function Tests: No results for input(s): TSH, T4TOTAL, FREET4, T3FREE, THYROIDAB in the last 72 hours. Anemia Panel: No results for input(s): VITAMINB12, FOLATE, FERRITIN, TIBC, IRON, RETICCTPCT in the last 72 hours. Sepsis Labs: Recent Labs  Lab 08/28/20 2209  LATICACIDVEN 2.2*     Recent Results (from the past 240 hour(s))  Resp Panel by RT-PCR (Flu A&B, Covid) Nasopharyngeal Swab     Status: None   Collection Time: 08/28/20 10:37 PM   Specimen: Nasopharyngeal Swab; Nasopharyngeal(NP) swabs in vial transport medium  Result Value Ref Range Status   SARS Coronavirus 2 by RT PCR NEGATIVE NEGATIVE Final    Comment: (NOTE) SARS-CoV-2 target nucleic acids are NOT DETECTED.  The SARS-CoV-2 RNA is generally detectable in upper respiratory specimens during the acute phase of infection. The lowest concentration of SARS-CoV-2 viral copies this assay can detect is 138 copies/mL. A negative result does not preclude SARS-Cov-2 infection and should not be used as the sole basis for treatment or other patient management decisions. A negative result may occur with  improper specimen collection/handling, submission of specimen other than nasopharyngeal swab, presence of viral mutation(s) within the areas targeted by this assay, and inadequate number of viral copies(<138 copies/mL). A negative result must be combined with clinical observations, patient history, and epidemiological information. The expected result is Negative.  Fact Sheet for Patients:  EntrepreneurPulse.com.au  Fact Sheet for Healthcare Providers:  IncredibleEmployment.be  This test is no t yet approved or cleared by the [place] FDA and  has been authorized for detection and/or diagnosis of SARS-CoV-2 by FDA under an Emergency Use Authorization (EUA). This EUA will remain   in effect (meaning this test can be used) for the duration of the COVID-19 declaration under Section 564(b)(1) of the Act, 21 U.S.C.section 360bbb-3(b)(1), unless the authorization is terminated  or revoked sooner.       Influenza A by PCR NEGATIVE NEGATIVE Final   Influenza B by PCR NEGATIVE NEGATIVE Final    Comment: (NOTE) The Xpert Xpress SARS-CoV-2/FLU/RSV plus assay is intended as an aid in the diagnosis of influenza from Nasopharyngeal swab specimens and should not be used as a sole basis for treatment. Nasal washings and aspirates are unacceptable for Xpert Xpress SARS-CoV-2/FLU/RSV testing.  Fact Sheet for Patients: EntrepreneurPulse.com.au  Fact Sheet for Healthcare Providers: IncredibleEmployment.be  This test is not yet approved or cleared by the [place] FDA and has been authorized for detection and/or diagnosis of SARS-CoV-2 by FDA under an Emergency Use Authorization (EUA). This EUA will remain in effect (meaning this test can be used) for the duration of the COVID-19 declaration under Section 564(b)(1) of the Act, 21 U.S.C. section 360bbb-3(b)(1), unless the authorization is terminated or revoked.  Performed at [facility], [place] [address], [place], [place] [address]   Surgical PCR screen     Status: None   Collection Time: 08/29/20 10:59 PM   Specimen: Nasal Mucosa; Nasal Swab  Result Value Ref Range Status   MRSA, PCR NEGATIVE NEGATIVE Final   Staphylococcus aureus NEGATIVE NEGATIVE Final    Comment: (NOTE) The Xpert SA Assay (FDA approved for NASAL specimens in patients 35 years of age and older), is one component of a comprehensive surveillance program. It is not intended to diagnose infection nor to guide or monitor treatment. Performed at [facility], [place] [address] [place],  [address]  RN Pressure Injury Documentation:     Estimated body mass index is 23.56 kg/m as calculated  from the following:   Height as of this encounter: '5\' 3"'$  (1.6 m).   Weight as of this encounter: 60.3 kg.  Malnutrition Type:   Malnutrition Characteristics:   Nutrition Interventions:     Radiology Studies: Pelvis Portable  Result Date: 08/30/2020 CLINICAL DATA:  Status post right hip arthroplasty. EXAM: PORTABLE PELVIS 1-2 VIEWS COMPARISON:  Preoperative radiograph 08/28/2020 FINDINGS: Right hip hemiarthroplasty in expected alignment. There is no periprosthetic lucency or fracture. Recent postsurgical change includes air and edema in the soft tissues with lateral skin staples in place. IMPRESSION: Right hip hemiarthroplasty without immediate postoperative complication. Electronically Signed   By: Keith Rake M.D.   On: 08/30/2020 15:16   DG Knee Right Port  Result Date: 08/29/2020 CLINICAL DATA:  Right femoral neck fracture. EXAM: PORTABLE RIGHT KNEE - 1-2 VIEW COMPARISON:  None. FINDINGS: No evidence of fracture, dislocation, or joint effusion. Severe degenerative changes seen involving the lateral joint space. Mild degenerative changes seen involving the medial joint space. Moderate degenerative changes seen involving the patellofemoral space with osteophyte formation. Vascular calcifications are noted. IMPRESSION: Tricompartmental degenerative joint disease. No acute abnormality seen. Electronically Signed   By: Marijo Conception M.D.   On: 08/29/2020 11:35   DG C-Arm 1-60 Min  Result Date: 08/30/2020 CLINICAL DATA:  Elective surgery. EXAM: OPERATIVE RIGHT HIP (WITH PELVIS IF PERFORMED) TECHNIQUE: Fluoroscopic spot image(s) were submitted for interpretation post-operatively. COMPARISON:  None. FINDINGS: Four fluoroscopic spot views of the pelvis and right hip obtained in the operating room. Right hip hemi arthroplasty has been placed. Fluoroscopy time 3 seconds. Dose 0.42 mGy. IMPRESSION: Procedural fluoroscopy for right hip arthroplasty. Electronically Signed   By: Keith Rake  M.D.   On: 08/30/2020 15:15   DG HIP OPERATIVE UNILAT WITH PELVIS RIGHT  Result Date: 08/30/2020 CLINICAL DATA:  Elective surgery. EXAM: OPERATIVE RIGHT HIP (WITH PELVIS IF PERFORMED) TECHNIQUE: Fluoroscopic spot image(s) were submitted for interpretation post-operatively. COMPARISON:  None. FINDINGS: Four fluoroscopic spot views of the pelvis and right hip obtained in the operating room. Right hip hemi arthroplasty has been placed. Fluoroscopy time 3 seconds. Dose 0.42 mGy. IMPRESSION: Procedural fluoroscopy for right hip arthroplasty. Electronically Signed   By: Keith Rake M.D.   On: 08/30/2020 15:15    Scheduled Meds:  amiodarone  100 mg Oral QODAY   aspirin  81 mg Oral BID WC   atorvastatin  10 mg Oral Daily   brimonidine  1 drop Both Eyes BID   Chlorhexidine Gluconate Cloth  6 each Topical Daily   clopidogrel  75 mg Oral Daily   docusate sodium  100 mg Oral BID   isosorbide mononitrate  10 mg Oral BID   latanoprost  1 drop Both Eyes QHS   mupirocin ointment  1 application Nasal BID   pantoprazole  40 mg Oral q morning   senna  1 tablet Oral BID   sertraline  50 mg Oral Daily   sucralfate  1 g Oral BID   vitamin B-12  1,000 mcg Oral Daily   Continuous Infusions:  tranexamic acid      LOS: 3 days   Kerney Elbe, DO [facility] PAGER is on AMION  If 7PM-7AM, please contact night-coverage [email or web address]

## 2020-08-31 NOTE — Plan of Care (Signed)

## 2020-08-31 NOTE — Progress Notes (Signed)
Subjective: 1 Day Post-Op Procedure(s) (LRB): HEMI  HIP ARTHROPLASTY ANTERIOR APPROACH (Right) Seen in rounds for Dr. Lyla Glassing Patient reports pain as mild.   Denies any CP, SOB Was able to have a BM this am No complaints   Objective: Vital signs in last 24 hours: Temp:  [97.1 F (36.2 C)-98.8 F (37.1 C)] 98.8 F (37.1 C) (08/13 2043) Pulse Rate:  [95-100] 100 (08/13 2043) Resp:  [17-27] 17 (08/13 2043) BP: (114-178)/(57-133) 114/57 (08/13 2043) SpO2:  [87 %-99 %] 93 % (08/13 2043)  Intake/Output from previous day: 08/13 0701 - 08/14 0700 In: 1100 [I.V.:900; IV Piggyback:200] Out: 900 [Urine:850; Blood:50] Intake/Output this shift: No intake/output data recorded.  Recent Labs    08/28/20 2209 08/28/20 2213 08/30/20 0309 08/31/20 0121  HGB 10.9* 10.9* 11.3* 9.9*   Recent Labs    08/30/20 0309 08/31/20 0121  WBC 10.3 10.2  RBC 3.91 3.33*  HCT 34.2* 29.3*  PLT 127* 121*   Recent Labs    08/30/20 0309 08/31/20 0121  NA 128* 128*  K 3.6 4.1  CL 93* 97*  CO2 26 19*  BUN 15 22  CREATININE 1.17* 1.27*  GLUCOSE 120* 105*  CALCIUM 9.3 8.5*   Recent Labs    08/28/20 2209  INR 1.1    Neurologically intact Neurovascular intact Sensation intact distally Intact pulses distally Dorsiflexion/Plantar flexion intact Incision: dressing C/D/I No cellulitis present Compartment soft   Assessment/Plan: 1 Day Post-Op Procedure(s) (LRB): HEMI  HIP ARTHROPLASTY ANTERIOR APPROACH (Right) Advance diet Up with therapy, WBAT DVT ppx: ASA, SCDs, TEDs PT/OT     Nettie Elm [facility] [phone] 08/31/2020, 9:36 AM

## 2020-08-31 NOTE — Evaluation (Signed)
Physical Therapy Evaluation Patient Details Name: Sheri Hensley MRN: [id number] DOB: Jun 12, 1919 Today's Date: 08/31/2020   History of Present Illness  Pt is a 87 y/o female admitted after a fall in which she sustained a R femoral neck fracture. She is now s/p R hip hemiarthroplasty. PMH including but not limited to a-fib.  Clinical Impression  Pt presented supine in bed with HOB elevated, awake and willing to participate in therapy session. Pt's granddaughter was present throughout session as well. Prior to admission, pt reported that she ambulated with use of either a cane or rollator and required assistance from family with ADLs/IADLs. Pt lives alone but her daughter comes over daily for a few hours to assist with ADLs and household tasks. At the time of evaluation, pt limited secondary to fatigue, weakness and pain. She required mod A for bed mobility, min A for transfers and was only able to tolerate taking a couple of side steps at EOB with min A and use of RW. Pt would continue to benefit from skilled physical therapy services at this time while admitted and after d/c to address the below listed limitations in order to improve overall safety and independence with functional mobility.     Follow Up Recommendations SNF    Equipment Recommendations  None recommended by PT    Recommendations for Other Services       Precautions / Restrictions Precautions Precautions: Fall Restrictions Weight Bearing Restrictions: Yes RLE Weight Bearing: Weight bearing as tolerated      Mobility  Bed Mobility Overal bed mobility: Needs Assistance Bed Mobility: Supine to Sit;Sit to Supine     Supine to sit: Mod assist Sit to supine: Mod assist   General bed mobility comments: increased time and effort, verbal and tactile cueing for sequencing and technique, assistance needed for management of bilateral LEs, pt able to manage her trunk without physical assistance    Transfers Overall  transfer level: Needs assistance Equipment used: Rolling walker (2 wheeled) Transfers: Sit to/from Stand Sit to Stand: From elevated surface;Min assist         General transfer comment: pt performed sit<>stand x2 from EOB with cueing for technique and safe hand placement, very slow to rise, min A for stability  Ambulation/Gait             General Gait Details: pt only able to take 2 side steps at EOB with use of RW and min A; limited secondary to fatigue and pain  Stairs            Wheelchair Mobility    Modified Rankin (Stroke Patients Only)       Balance Overall balance assessment: Needs assistance Sitting-balance support: Feet supported Sitting balance-Leahy Scale: Fair     Standing balance support: During functional activity;Bilateral upper extremity supported Standing balance-Leahy Scale: Poor                               Pertinent Vitals/Pain Pain Assessment: Faces Faces Pain Scale: Hurts little more Pain Location: R hip Pain Descriptors / Indicators: Sore Pain Intervention(s): Monitored during session    Home Living Family/patient expects to be discharged to:: Private residence Living Arrangements: Alone Available Help at Discharge: Family;Available PRN/intermittently Type of Home: House Home Access: Ramped entrance     Home Layout: One level Home Equipment: Walker - 2 wheels;Walker - 4 wheels;Cane - single point      Prior Function Level of Independence:  Needs assistance   Gait / Transfers Assistance Needed: ambulates with either a cane or rollator  ADL's / Homemaking Assistance Needed: her daughter comes over daily for a few hours to assist her with bathing, dressing and household tasks        Hand Dominance        Extremity/Trunk Assessment   Upper Extremity Assessment Upper Extremity Assessment: Generalized weakness    Lower Extremity Assessment Lower Extremity Assessment: RLE deficits/detail RLE Deficits /  Details: pt with decreased strength and limited AROM secondary to post-op pain and weakness RLE: Unable to fully assess due to pain    Cervical / Trunk Assessment Cervical / Trunk Assessment: Kyphotic  Communication   Communication: No difficulties  Cognition Arousal/Alertness: Awake/alert Behavior During Therapy: WFL for tasks assessed/performed Overall Cognitive Status: Impaired/Different from baseline Area of Impairment: Memory;Following commands;Safety/judgement;Awareness;Problem solving                     Memory: Decreased short-term memory Following Commands: Follows one step commands with increased time Safety/Judgement: Decreased awareness of deficits Awareness: Intellectual Problem Solving: Slow processing;Decreased initiation;Difficulty sequencing;Requires verbal cues        General Comments      Exercises General Exercises - Lower Extremity Ankle Circles/Pumps: AROM;Both;20 reps;Supine   Assessment/Plan    PT Assessment Patient needs continued PT services  PT Problem List Decreased strength;Decreased range of motion;Decreased activity tolerance;Decreased balance;Decreased mobility;Decreased coordination;Decreased knowledge of use of DME;Decreased safety awareness;Decreased knowledge of precautions;Pain       PT Treatment Interventions DME instruction;Gait training;Stair training;Functional mobility training;Therapeutic activities;Therapeutic exercise;Balance training;Neuromuscular re-education;Patient/family education    PT Goals (Current goals can be found in the Care Plan section)  Acute Rehab PT Goals Patient Stated Goal: to get up and return to PLOF PT Goal Formulation: With patient/family Time For Goal Achievement: 09/14/20 Potential to Achieve Goals: Good    Frequency Min 5X/week   Barriers to discharge        Co-evaluation               AM-PAC PT "6 Clicks" Mobility  Outcome Measure Help needed turning from your back to your side  while in a flat bed without using bedrails?: A Lot Help needed moving from lying on your back to sitting on the side of a flat bed without using bedrails?: A Lot Help needed moving to and from a bed to a chair (including a wheelchair)?: A Lot Help needed standing up from a chair using your arms (e.g., wheelchair or bedside chair)?: A Lot Help needed to walk in hospital room?: Total Help needed climbing 3-5 steps with a railing? : Total 6 Click Score: 10    End of Session Equipment Utilized During Treatment: Gait belt Activity Tolerance: Patient limited by fatigue;Patient limited by pain Patient left: in bed;with call bell/phone within reach;with bed alarm set;with family/visitor present Nurse Communication: Mobility status;Need for lift equipment;Other (comment) (pt on bed pan attempting to have a BM) PT Visit Diagnosis: Other abnormalities of gait and mobility (R26.89);Pain Pain - Right/Left: Right Pain - part of body: Hip    Time: [id number] PT Time Calculation (min) (ACUTE ONLY): 31 min   Charges:   PT Evaluation $PT Eval Moderate Complexity: 1 Mod PT Treatments $Therapeutic Activity: 8-22 mins        Anastasio Champion, DPT  Acute Rehabilitation Services Office [place] 08/31/2020, 9:59 AM

## 2020-09-01 DIAGNOSIS — E872 Acidosis: Secondary | ICD-10-CM

## 2020-09-01 DIAGNOSIS — N1832 Chronic kidney disease, stage 3b: Secondary | ICD-10-CM

## 2020-09-01 LAB — CBC WITH DIFFERENTIAL/PLATELET
Abs Immature Granulocytes: 0.03 10*3/uL (ref 0.00–0.07)
Basophils Absolute: 0 10*3/uL (ref 0.0–0.1)
Basophils Relative: 0 %
Eosinophils Absolute: 0.2 10*3/uL (ref 0.0–0.5)
Eosinophils Relative: 3 %
HCT: 25.7 % — ABNORMAL LOW (ref 36.0–46.0)
Hemoglobin: 8.7 g/dL — ABNORMAL LOW (ref 12.0–15.0)
Immature Granulocytes: 0 %
Lymphocytes Relative: 8 %
Lymphs Abs: 0.6 10*3/uL — ABNORMAL LOW (ref 0.7–4.0)
MCH: 29.6 pg (ref 26.0–34.0)
MCHC: 33.9 g/dL (ref 30.0–36.0)
MCV: 87.4 fL (ref 80.0–100.0)
Monocytes Absolute: 0.4 10*3/uL (ref 0.1–1.0)
Monocytes Relative: 6 %
Neutro Abs: 5.8 10*3/uL (ref 1.7–7.7)
Neutrophils Relative %: 83 %
Platelets: 114 10*3/uL — ABNORMAL LOW (ref 150–400)
RBC: 2.94 MIL/uL — ABNORMAL LOW (ref 3.87–5.11)
RDW: 14.5 % (ref 11.5–15.5)
WBC: 7 10*3/uL (ref 4.0–10.5)
nRBC: 0 % (ref 0.0–0.2)

## 2020-09-01 LAB — MAGNESIUM: Magnesium: 1.9 mg/dL (ref 1.7–2.4)

## 2020-09-01 LAB — COMPREHENSIVE METABOLIC PANEL
ALT: 14 U/L (ref 0–44)
AST: 43 U/L — ABNORMAL HIGH (ref 15–41)
Albumin: 3 g/dL — ABNORMAL LOW (ref 3.5–5.0)
Alkaline Phosphatase: 54 U/L (ref 38–126)
Anion gap: 8 (ref 5–15)
BUN: 28 mg/dL — ABNORMAL HIGH (ref 8–23)
CO2: 25 mmol/L (ref 22–32)
Calcium: 8.3 mg/dL — ABNORMAL LOW (ref 8.9–10.3)
Chloride: 98 mmol/L (ref 98–111)
Creatinine, Ser: 1.32 mg/dL — ABNORMAL HIGH (ref 0.44–1.00)
GFR, Estimated: 36 mL/min — ABNORMAL LOW (ref >60)
Glucose, Bld: 138 mg/dL — ABNORMAL HIGH (ref 70–99)
Potassium: 3.2 mmol/L — ABNORMAL LOW (ref 3.5–5.1)
Sodium: 131 mmol/L — ABNORMAL LOW (ref 135–145)
Total Bilirubin: 1.2 mg/dL (ref 0.3–1.2)
Total Protein: 5.4 g/dL — ABNORMAL LOW (ref 6.5–8.1)

## 2020-09-01 LAB — PHOSPHORUS: Phosphorus: 2.3 mg/dL — ABNORMAL LOW (ref 2.5–4.6)

## 2020-09-01 MED ORDER — TRAMADOL HCL 50 MG PO TABS: 50.0000 mg | ORAL_TABLET | Freq: Four times a day (QID) | ORAL | 0 refills | Status: AC | PRN

## 2020-09-01 MED ORDER — ASPIRIN 81 MG PO CHEW: 81.0000 mg | CHEWABLE_TABLET | Freq: Two times a day (BID) | ORAL | 0 refills | Status: AC

## 2020-09-01 MED ORDER — ASPIRIN 81 MG PO CHEW: 81.0000 mg | CHEWABLE_TABLET | Freq: Two times a day (BID) | ORAL | 0 refills | Status: DC

## 2020-09-01 MED ORDER — TRAMADOL HCL 50 MG PO TABS: 50.0000 mg | ORAL_TABLET | Freq: Four times a day (QID) | ORAL | 0 refills | Status: DC | PRN

## 2020-09-01 NOTE — Progress Notes (Signed)
Subjective: 2 Days Post-Op Procedure(s) (LRB): HEMI  HIP ARTHROPLASTY ANTERIOR APPROACH (Right) Seen in rounds for Dr. Lyla Glassing Patient reports pain as mild.   Denies any CP, SOB Concerned about new onset confusion   Objective: Vital signs in last 24 hours: Temp:  [97.8 F (36.6 C)] 97.8 F (36.6 C) (08/14 0939) Pulse Rate:  [85] 85 (08/14 0939) BP: (127)/(57) 127/57 (08/14 0939) SpO2:  [95 %] 95 % (08/14 0939)  Intake/Output from previous day: 08/14 0701 - 08/15 0700 In: 1120 [P.O.:1070; IV Piggyback:50] Out: 1 [Stool:1] Intake/Output this shift: No intake/output data recorded.  Recent Labs    08/30/20 0309 08/31/20 0121  HGB 11.3* 9.9*    Recent Labs    08/30/20 0309 08/31/20 0121  WBC 10.3 10.2  RBC 3.91 3.33*  HCT 34.2* 29.3*  PLT 127* 121*    Recent Labs    08/30/20 0309 08/31/20 0121  NA 128* 128*  K 3.6 4.1  CL 93* 97*  CO2 26 19*  BUN 15 22  CREATININE 1.17* 1.27*  GLUCOSE 120* 105*  CALCIUM 9.3 8.5*    No results for input(s): LABPT, INR in the last 72 hours.   Neurologically intact Neurovascular intact Sensation intact distally Intact pulses distally Dorsiflexion/Plantar flexion intact Incision: dressing C/D/I No cellulitis present Compartment soft   Assessment/Plan: 2 Days Post-Op Procedure(s) (LRB): HEMI  HIP ARTHROPLASTY ANTERIOR APPROACH (Right) Advance diet Up with therapy, WBAT DVT ppx: ASA, SCDs, TEDs PT/OT Follow up with Dr.Swinteck in 2 weeks for incision check and radiographs.  Clear from orthopedic standpoint once cleared by therapy.  Likely needs SNF placement     Dorothyann Peng, PA-C [facility] 09/01/2020, 8:28 AM

## 2020-09-01 NOTE — Progress Notes (Signed)
Physical Therapy Treatment Patient Details Name: Sheri Hensley MRN: [id number] DOB: 1919-03-22 Today's Date: 09/01/2020    History of Present Illness Pt is a 87 y/o female admitted after a fall in which she sustained a R femoral neck fracture. She is now s/p R hip hemiarthroplasty. PMH including but not limited to a-fib.    PT Comments    Per pt's daughter, pt confused overnight and did not sleep well, but pt motivated to participate in PT. Pt overall requiring mod +2 assist for bed mobility, and x2 stand pivots during session. Pt with increasing anxiety and fear of falling in standing, requires max cuing and encouragement to continue mobility. PT to continue to follow acutely, SNF remains appropriate at this time.     Follow Up Recommendations  SNF     Equipment Recommendations  None recommended by PT    Recommendations for Other Services       Precautions / Restrictions Precautions Precautions: Fall Restrictions Weight Bearing Restrictions: No RLE Weight Bearing: Weight bearing as tolerated    Mobility  Bed Mobility Overal bed mobility: Needs Assistance Bed Mobility: Supine to Sit     Supine to sit: Mod assist;+2 for physical assistance     General bed mobility comments: mod assist for trunk and LE management, scooting to EOB with lateral weight shifting and bed pads.    Transfers Overall transfer level: Needs assistance Equipment used: Rolling walker (2 wheeled) Transfers: Sit to/from [facility] Sit to Stand: Mod assist;+2 safety/equipment Stand pivot transfers: Mod assist;+2 physical assistance       General transfer comment: Mod +2 for rise, hip extension to upright, steadying. STS x2, from EOB and BSC. Stand pivot x2, with heavy posterior support given pt's posterior bias.  Ambulation/Gait             General Gait Details: nt - pivotal steps only   Marine scientist Rankin (Stroke  Patients Only)       Balance Overall balance assessment: Needs assistance Sitting-balance support: Feet supported Sitting balance-Leahy Scale: Fair     Standing balance support: During functional activity;Bilateral upper extremity supported Standing balance-Leahy Scale: Poor                              Cognition Arousal/Alertness: Awake/alert Behavior During Therapy: WFL for tasks assessed/performed Overall Cognitive Status: Impaired/Different from baseline Area of Impairment: Memory;Following commands;Safety/judgement;Awareness;Problem solving;Orientation                 Orientation Level: Disoriented to;Situation   Memory: Decreased short-term memory Following Commands: Follows one step commands with increased time Safety/Judgement: Decreased awareness of deficits Awareness: Intellectual Problem Solving: Slow processing;Decreased initiation;Difficulty sequencing;Requires verbal cues General Comments: pt states her daughter is her mother, cannot recall her name initially. Per pt's daughter, pt was confused overnight and has a history of "sundowning" since admission. Pt requires cues to continue mobility when mid-transfer x2, states "wait a minute" and unsafely stops mid-transfer.      Exercises      General Comments        Pertinent Vitals/Pain Pain Assessment: Faces Faces Pain Scale: Hurts even more Pain Location: R hip, during mobility Pain Descriptors / Indicators: Sore;Discomfort Pain Intervention(s): Limited activity within patient's tolerance;Monitored during session;Repositioned    Home Living  Prior Function            PT Goals (current goals can now be found in the care plan section) Acute Rehab PT Goals Patient Stated Goal: to get up and return to PLOF PT Goal Formulation: With patient/family Time For Goal Achievement: 09/14/20 Potential to Achieve Goals: Good Progress towards PT goals: Progressing  toward goals    Frequency    Min 5X/week      PT Plan Current plan remains appropriate    Co-evaluation              AM-PAC PT "6 Clicks" Mobility   Outcome Measure  Help needed turning from your back to your side while in a flat bed without using bedrails?: A Lot Help needed moving from lying on your back to sitting on the side of a flat bed without using bedrails?: A Lot Help needed moving to and from a bed to a chair (including a wheelchair)?: A Lot Help needed standing up from a chair using your arms (e.g., wheelchair or bedside chair)?: A Lot Help needed to walk in hospital room?: A Lot Help needed climbing 3-5 steps with a railing? : Total 6 Click Score: 11    End of Session Equipment Utilized During Treatment: Gait belt Activity Tolerance: Patient limited by fatigue;Patient limited by pain Patient left: with call bell/phone within reach;with family/visitor present;in chair;with chair alarm set Nurse Communication: Mobility status;Need for lift equipment (stedy for back to bed) PT Visit Diagnosis: Other abnormalities of gait and mobility (R26.89);Pain Pain - Right/Left: Right Pain - part of body: Hip     Time: 0902-0930 PT Time Calculation (min) (ACUTE ONLY): 28 min  Charges:  $Therapeutic Activity: 23-37 mins                     Sheri Hensley, PT DPT Acute Rehabilitation Services Pager [phone]  Office [phone]    Sheri Hensley 09/01/2020, 11:12 AM

## 2020-09-01 NOTE — NC FL2 (Signed)
[place] LEVEL OF CARE SCREENING TOOL     IDENTIFICATION  Patient Name: Sheri Hensley Birthdate: 04/11/1919 Sex: female Admission Date (Current Location): 08/28/2020  [facility] and [place] Number:  Herbalist and Address:  [facility], [place] [address] [place], [place] [address]      Provider Number: [id number]  Attending Physician Name and Address:  Kerney Elbe, DO  Relative Name and Phone Number:  Apple,Vickie (Daughter)   [phone]    Current Level of Care: Hospital Recommended Level of Care: [place] Prior Approval Number:    Date Approved/Denied:   PASRR Number: [id number]  Discharge Plan: SNF    Current Diagnoses: Patient Active Problem List   Diagnosis Date Noted   AKI (acute kidney injury) ([place]) 08/29/2020   AF (paroxysmal atrial fibrillation) ([place]) 08/29/2020   Hyponatremia 08/29/2020   Closed right hip fracture ([place]) 08/28/2020    Orientation RESPIRATION BLADDER Height & Weight     Self  Normal Continent Weight: 133 lb (60.3 kg) Height:  '5\' 3"'$  (160 cm)  BEHAVIORAL SYMPTOMS/MOOD NEUROLOGICAL BOWEL NUTRITION STATUS      Continent Diet (see d/c summary)  AMBULATORY STATUS COMMUNICATION OF NEEDS Skin   Limited Assist Verbally Surgical wounds                       Personal Care Assistance Level of Assistance  Bathing, Dressing, Feeding Bathing Assistance: Limited assistance Feeding assistance: Independent Dressing Assistance: Limited assistance     Functional Limitations Info  Sight, Hearing, Speech Sight Info: Impaired Hearing Info: Impaired Speech Info: Adequate    SPECIAL CARE FACTORS FREQUENCY  PT (By licensed PT), OT (By licensed OT)     PT Frequency: 5x/week OT Frequency: 5x/ week            Contractures Contractures Info: Not present    Additional Factors Info  Code Status, Allergies Code Status Info: Full Allergies Info: Lorazepam    Metronidazole           Current Medications (09/01/2020):  This is the current hospital active medication list Current Facility-Administered Medications  Medication Dose Route Frequency Provider Last Rate Last Admin   acetaminophen (TYLENOL) tablet 650 mg  650 mg Oral Q6H PRN Raiford Noble [place], DO   650 mg at 08/31/20 2332   amiodarone (PACERONE) tablet 100 mg  100 mg Oral Tawny Hopping, Aaron Edelman, MD   100 mg at 08/31/20 0945   aspirin chewable tablet 81 mg  81 mg Oral BID WC Rod Can, MD   81 mg at 09/01/20 0840   atorvastatin (LIPITOR) tablet 10 mg  10 mg Oral Daily Swinteck, Aaron Edelman, MD   10 mg at 09/01/20 0840   brimonidine (ALPHAGAN) 0.15 % ophthalmic solution 1 drop  1 drop Both Eyes BID Rod Can, MD   1 drop at 09/01/20 0843   Chlorhexidine Gluconate Cloth 2 % PADS 6 each  6 each Topical Daily Raiford Noble [place], DO   6 each at 09/01/20 0844   clopidogrel (PLAVIX) tablet 75 mg  75 mg Oral Daily Darlina Sicilian, RPH   75 mg at 09/01/20 0840   docusate sodium (COLACE) capsule 100 mg  100 mg Oral BID Rod Can, MD   100 mg at 09/01/20 0840   isosorbide mononitrate (ISMO) tablet 10 mg  10 mg Oral BID Rod Can, MD   10 mg at 09/01/20 0840   latanoprost (XALATAN) 0.005 % ophthalmic  solution 1 drop  1 drop Both Eyes QHS Swinteck, Aaron Edelman, MD   1 drop at 08/31/20 2130   menthol-cetylpyridinium (CEPACOL) lozenge 3 mg  1 lozenge Oral PRN Swinteck, Aaron Edelman, MD       Or   phenol (CHLORASEPTIC) mouth spray 1 spray  1 spray Mouth/Throat PRN Swinteck, Aaron Edelman, MD       metoCLOPramide (REGLAN) tablet 5 mg  5 mg Oral Q8H PRN Swinteck, Aaron Edelman, MD       Or   metoCLOPramide (REGLAN) injection 5 mg  5 mg Intravenous Q8H PRN Swinteck, Aaron Edelman, MD       mupirocin ointment (BACTROBAN) 2 % 1 application  1 application Nasal BID Swinteck, Aaron Edelman, MD       ondansetron [facility]) tablet 4 mg  4 mg Oral Q6H PRN Swinteck, Aaron Edelman, MD       Or   ondansetron (ZOFRAN) injection 4 mg  4 mg Intravenous  Q6H PRN Swinteck, Aaron Edelman, MD       pantoprazole (PROTONIX) EC tablet 40 mg  40 mg Oral q morning Rod Can, MD   40 mg at 09/01/20 0847   senna (SENOKOT) tablet 8.6 mg  1 tablet Oral BID Rod Can, MD   8.6 mg at 09/01/20 0840   sertraline (ZOLOFT) tablet 50 mg  50 mg Oral Daily Swinteck, Aaron Edelman, MD   50 mg at 09/01/20 0840   sucralfate (CARAFATE) tablet 1 g  1 g Oral BID Rod Can, MD   1 g at 09/01/20 0840   traMADol (ULTRAM) tablet 50 mg  50 mg Oral Q6H PRN Rod Can, MD   50 mg at 08/31/20 2120   tranexamic acid (CYKLOKAPRON) IVPB 1,000 mg  1,000 mg Intravenous Once Rod Can, MD       vitamin B-12 (CYANOCOBALAMIN) tablet 1,000 mcg  1,000 mcg Oral Daily Rod Can, MD   1,000 mcg at 09/01/20 0840     Discharge Medications: Please see discharge summary for a list of discharge medications.  Relevant Imaging Results:  Relevant Lab Results:   Additional Information SSN:  [id number]; Family reports patient is COVID vaccinated with 2 boosters  Francys Bolin F Zaela Graley, LCSWA

## 2020-09-01 NOTE — Progress Notes (Addendum)
PROGRESS NOTE    Sheri Hensley  [id number] DOB: [birth year] DOA: 08/28/2020 PCP: Josetta Huddle, MD   Brief Narrative:   The patient is a 87 year old Caucasian female with a past medical history significant for but not limited to proximal atrial fibrillation on antiplatelets with Plavix as well as other comorbidities presented via EMS after a fall at her home.  Patient states that she is getting up along her back door before she went to bed as she normally does and she lost her balance and fell landing on her right side.  She reported pain in her right hip and was unable to stand.  She also had the back of the right side of her head on the floor but did not lose consciousness.  She had her cell phone with her and call 911.  EMS placed her in a c-collar and brought to emergency room.  She is not hypoxic and did not have any complaints of chest pain.  She did report having a mild headache in the back of her head but no visual changes.  She denies any numbness of her extremities or face.  Patient was alert and oriented and she does take Plavix and amiodarone for proximal atrial fibrillation and is very independent lives alone.  She was admitted for a closed right hip fracture and orthopedics was consulted.  Pain control was initiated and the plan is for a right hip Hemi anterior approach by Dr. Lyla Glassing on 08/30/20. She is POD2 now and yesterday was still having some confusion/delirium but is much more awake and alert today and back to baseline. Blood count has dropped a little so will need to continue to Monitor closely   Assessment & Plan:   Principal Problem:   Closed right hip fracture (HCC) Active Problems:   AKI (acute kidney injury) ([place])   AF (paroxysmal atrial fibrillation) (HCC)   Hyponatremia    Closed right hip fracture s/p Right Hip Hemiarthroplasty, Anterior Approach POD 2 -Ms. Firkus has right hip fracture.  -Orthopedics has been consulted and she was made NPO after  midnight per Ortho request. Now is s/p Right Hip Hemiarthroplasty from the Anterior Approach -Pain control with Dilaudid 0.5 mg IV every 2 as needed for moderate pain as added but Will stop this and add Acetaminophen 650 mg po q5hprn Mild Pain and C/w Tramadol 50 mg po q6hprn Moderate Pain -Bowl Regimen with Senna 8.6 mg po BID  -C/w Supportive Care with Ondansetron 4 mg po/IV q6hprn Nausae and Metaclopramide 5 mg po/IV q8hprn if Ondansetron is ineffective  -Will need PT evaluation and case management consult after ortho evaluates pt to arrange for rehab needs. PT/OT now recommending SNF -Orthopedic surgery recommending up with therapy advancing diet and weightbearing as tolerated -DVT prophylaxis they have recommended aspirin 81 mg p.o. twice daily, SCDs and teds   AKI (acute kidney injury) vs CKD Stage 3b; But Suspect it is most likely CKD Stage 3b Metabolic Acidosis -IVF hydration with LR at 75 ml/hr to be continued -Patient's BUN/Cr is trending down and went from 18/1.30 -> 16/1.25 -> 15/1.17 -> 22/1.27 and repeat pending  -Patient has a Metabolic Acidosis with a CO2 of 19, AG of 12, and Chloride Level of 97 yesterday and repeat CMP ordered but pending  -Continue to hold her losartan/hydrochlorothiazide combination pill -C/w IVF  -Recheck renal function and electrolytes in am -Avoid nephrotoxic medications, contrast dyes, hypotension and renally dose medications -Repeat CMP in a.m.   AF (paroxysmal atrial fibrillation)  -  Has history of paroxysmal atrial fibrillation per daughter.  Is on amiodarone per daughter and have resumed Amiodarone 100 mg po EOD -She is on Plavix which is being held for surgery but now resumed today but may need to hold further if she continues to bleed.  She is not on DOAC or Coumadin secondary to fall risks -Continue to Monitor closely     Hyponatremia -C/w LR IVF hydration. Recheck electrolytes in am -Na+ went from 129 -> 130 -> 130 -> 128 -> 128 and repeat  pending  -Continue to Monitor and Trend -Repeat CMP in the AM  Post-Operative Acute Blood Loss Anemia superimposed on Normocytic Anemia  -Patient's Hgb/Hct went from 10.9/33.4 -> 10.9/32.0 -> 11.3/34.2  -> 9.9/29.3 and repeat pending  -Check Anemia Panel in the AM -Continue to Monitor for S/Sx of Bleeding; No overt bleeding noted -Repeat CBC in the AM   Thrombocytopenia -Patient's Platelet Count went from 130 -> 127 -> 121 and repeat pending  -Continue to monitor for S/Sx of Bleeding; No overt Bleeding noted -Repeat CBC in the AM    GERD -C/w PPI with Pantoprazole 40 mg every Morning  -C/w Sucralfate 1 gram po BID     HLD -C/w Atorvastatin 10 mg po Daily   Confusion and Agitation/ Delirium, improved  -Post-Operatively and likely an effect of Anesthesia and Pain meds  -Delirium Precautions -Minimize Narcotics and stop Hydromorphone -Continue to Reorient and if persists may pursue Head Imaging -Check TSH, RPR, B12 and they are pending to be done  -Patient is much more improved and back to baseline   Depression and Anxiety -C/w Sertraline 50 mg po Daily   HTN -Currently Holding Amlodipine 2.5 mg po Daily and Losartan-HCTZ 1 tab po Daily  -Continue to Monitor BP per Protocol -Last BP was 127/57 and not repeated yet for some reason  Hyperbilirubinemia, improving  -Likely Reactive -T Bili went from 1.1 -> 1.9 -> 1.4 and not repeated yet -Continue to Monitor and Trend -Repeat CMP in the AM    DVT prophylaxis: SCDs Code Status: FULL CODE Family Communication: Discussed with her Home Caregiver Disposition Plan: Pending further clinical improvement and now PT/OT recommending SNF  Status is: Inpatient  Remains inpatient appropriate because:Unsafe d/c plan, IV treatments appropriate due to intensity of illness or inability to take PO, and Inpatient level of care appropriate due to severity of illness  Dispo: The patient is from: Home              Anticipated d/c is to:   SNF              Patient currently is not medically stable to d/c.   Difficult to place patient No  Consultants:  Orthopedic Surgery   Procedures: Right hip hemiarthroplasty, anterior approach  Antimicrobials:  Anti-infectives (From admission, onward)    Start     Dose/Rate Route Frequency Ordered Stop   08/30/20 2100  ceFAZolin (ANCEF) IVPB 2g/100 mL premix        2 g 200 mL/hr over 30 Minutes Intravenous Every 12 hours 08/30/20 1839 08/30/20 2219   08/30/20 1900  ceFAZolin (ANCEF) IVPB 2g/100 mL premix  Status:  Discontinued        2 g 200 mL/hr over 30 Minutes Intravenous Every 6 hours 08/30/20 1807 08/30/20 1838   08/30/20 0600  ceFAZolin (ANCEF) IVPB 2g/100 mL premix        2 g 200 mL/hr over 30 Minutes Intravenous On call to O.R. 08/29/20 2008 08/30/20  0920        Subjective: Seen and examined at bedside and she was sitting in the chair bedside with no complaints. She was much more awake and alert and oriented x3. Had a bowel movement yesterday. No CP or SOB. Feels well and joking. Still awaiting repeat blood work to be drawn. No other concerns or complaints at this time.  Objective: Vitals:   08/30/20 1120 08/30/20 1135 08/30/20 2043 08/31/20 0939  BP: (!) 159/67 135/83 (!) 114/57 (!) 127/57  Pulse: 98 100 100 85  Resp: (!) 21 (!) 21 17   Temp: 98 F (36.7 C)  98.8 F (37.1 C) 97.8 F (36.6 C)  TempSrc:   Oral   SpO2: 97% 99% 93% 95%  Weight:      Height:        Intake/Output Summary (Last 24 hours) at 09/01/2020 [id number] Last data filed at 08/31/2020 1700 Gross per 24 hour  Intake 760 ml  Output --  Net 760 ml    Filed Weights   08/28/20 2203  Weight: 60.3 kg   Examination:  Physical Exam:  Constitutional: WN/WD thin elderly Caucasian female in NAD and appears calm and comfortable Eyes: Lids and conjunctivae normal, sclerae anicteric  ENMT: External Ears, Nose appear normal. Grossly normal hearing. Neck: Appears normal, supple, no cervical masses,  normal ROM, no appreciable JVD Respiratory: Diminished to auscultation bilaterally, no wheezing, rales, rhonchi or crackles. Normal respiratory effort and patient is not tachypenic. No accessory muscle use. Unlabored breathing  Cardiovascular: RRR, no murmurs / rubs / gallops. S1 and S2 auscultated. No extremity edema. Abdomen: Soft, non-tender, non-distended. Bowel sounds positive.  GU: Deferred. Musculoskeletal: No clubbing / cyanosis of digits/nails. No joint deformity upper and lower extremities.   Skin: No rashes, lesions, ulcers on a limited skin evaluation but has some seborrheic keratosis. No induration; Warm and dry.  Neurologic: CN 2-12 grossly intact with no focal deficits. Romberg sign and cerebellar reflexes not assessed.  Psychiatric: Normal judgment and insight. Alert and oriented x 3. Normal mood and appropriate affect.   Data Reviewed: I have personally reviewed following labs and imaging studies  CBC: Recent Labs  Lab 08/28/20 2209 08/28/20 2213 08/30/20 0309 08/31/20 0121  WBC 7.7  --  10.3 10.2  NEUTROABS  --   --  8.8* 9.0*  HGB 10.9* 10.9* 11.3* 9.9*  HCT 33.4* 32.0* 34.2* 29.3*  MCV 89.1  --  87.5 88.0  PLT 130*  --  127* 121*    Basic Metabolic Panel: Recent Labs  Lab 08/28/20 2209 08/28/20 2213 08/29/20 0500 08/30/20 0309 08/31/20 0121  NA 129* 130* 130* 128* 128*  K 3.8 3.8 4.0 3.6 4.1  CL 93* 95* 95* 93* 97*  CO2 24  --  23 26 19*  GLUCOSE 149* 148* 112* 120* 105*  BUN '17 18 16 15 22  '$ CREATININE 1.32* 1.30* 1.25* 1.17* 1.27*  CALCIUM 9.0  --  9.6 9.3 8.5*  MG  --   --   --  1.7 1.8  PHOS  --   --   --  3.4 2.6    GFR: Estimated Creatinine Clearance: 20.5 mL/min (A) (by C-G formula based on SCr of 1.27 mg/dL (H)). Liver Function Tests: Recent Labs  Lab 08/28/20 2209 08/30/20 0309 08/31/20 0121  AST 29 34 54*  ALT '19 21 19  '$ ALKPHOS 78 75 57  BILITOT 1.1 1.9* 1.4*  PROT 6.2* 6.4* 5.6*  ALBUMIN 4.0 3.7 3.1*    No  results for  input(s): LIPASE, AMYLASE in the last 168 hours. No results for input(s): AMMONIA in the last 168 hours. Coagulation Profile: Recent Labs  Lab 08/28/20 2209  INR 1.1    Cardiac Enzymes: No results for input(s): CKTOTAL, CKMB, CKMBINDEX, TROPONINI in the last 168 hours. BNP (last 3 results) No results for input(s): PROBNP in the last 8760 hours. HbA1C: No results for input(s): HGBA1C in the last 72 hours. CBG: No results for input(s): GLUCAP in the last 168 hours. Lipid Profile: No results for input(s): CHOL, HDL, LDLCALC, TRIG, CHOLHDL, LDLDIRECT in the last 72 hours. Thyroid Function Tests: No results for input(s): TSH, T4TOTAL, FREET4, T3FREE, THYROIDAB in the last 72 hours. Anemia Panel: No results for input(s): VITAMINB12, FOLATE, FERRITIN, TIBC, IRON, RETICCTPCT in the last 72 hours. Sepsis Labs: Recent Labs  Lab 08/28/20 2209  LATICACIDVEN 2.2*     Recent Results (from the past 240 hour(s))  Resp Panel by RT-PCR (Flu A&B, Covid) Nasopharyngeal Swab     Status: None   Collection Time: 08/28/20 10:37 PM   Specimen: Nasopharyngeal Swab; Nasopharyngeal(NP) swabs in vial transport medium  Result Value Ref Range Status   SARS Coronavirus 2 by RT PCR NEGATIVE NEGATIVE Final    Comment: (NOTE) SARS-CoV-2 target nucleic acids are NOT DETECTED.  The SARS-CoV-2 RNA is generally detectable in upper respiratory specimens during the acute phase of infection. The lowest concentration of SARS-CoV-2 viral copies this assay can detect is 138 copies/mL. A negative result does not preclude SARS-Cov-2 infection and should not be used as the sole basis for treatment or other patient management decisions. A negative result may occur with  improper specimen collection/handling, submission of specimen other than nasopharyngeal swab, presence of viral mutation(s) within the areas targeted by this assay, and inadequate number of viral copies(<138 copies/mL). A negative result must be  combined with clinical observations, patient history, and epidemiological information. The expected result is Negative.  Fact Sheet for Patients:  EntrepreneurPulse.com.au  Fact Sheet for Healthcare Providers:  IncredibleEmployment.be  This test is no t yet approved or cleared by the [place] FDA and  has been authorized for detection and/or diagnosis of SARS-CoV-2 by FDA under an Emergency Use Authorization (EUA). This EUA will remain  in effect (meaning this test can be used) for the duration of the COVID-19 declaration under Section 564(b)(1) of the Act, 21 U.S.C.section 360bbb-3(b)(1), unless the authorization is terminated  or revoked sooner.       Influenza A by PCR NEGATIVE NEGATIVE Final   Influenza B by PCR NEGATIVE NEGATIVE Final    Comment: (NOTE) The Xpert Xpress SARS-CoV-2/FLU/RSV plus assay is intended as an aid in the diagnosis of influenza from Nasopharyngeal swab specimens and should not be used as a sole basis for treatment. Nasal washings and aspirates are unacceptable for Xpert Xpress SARS-CoV-2/FLU/RSV testing.  Fact Sheet for Patients: EntrepreneurPulse.com.au  Fact Sheet for Healthcare Providers: IncredibleEmployment.be  This test is not yet approved or cleared by the [place] FDA and has been authorized for detection and/or diagnosis of SARS-CoV-2 by FDA under an Emergency Use Authorization (EUA). This EUA will remain in effect (meaning this test can be used) for the duration of the COVID-19 declaration under Section 564(b)(1) of the Act, 21 U.S.C. section 360bbb-3(b)(1), unless the authorization is terminated or revoked.  Performed at [facility], [place] [address] [place], [place] [address]   Surgical PCR screen     Status: None   Collection Time: 08/29/20 10:59  PM   Specimen: Nasal Mucosa; Nasal Swab  Result Value Ref Range Status   MRSA, PCR NEGATIVE  NEGATIVE Final   Staphylococcus aureus NEGATIVE NEGATIVE Final    Comment: (NOTE) The Xpert SA Assay (FDA approved for NASAL specimens in patients 93 years of age and older), is one component of a comprehensive surveillance program. It is not intended to diagnose infection nor to guide or monitor treatment. Performed at [facility], [place] [address], [place], [place] [address]      RN Pressure Injury Documentation:     Estimated body mass index is 23.56 kg/m as calculated from the following:   Height as of this encounter: '5\' 3"'$  (1.6 m).   Weight as of this encounter: 60.3 kg.  Malnutrition Type:   Malnutrition Characteristics:   Nutrition Interventions:     Radiology Studies: Pelvis Portable  Result Date: 08/30/2020 CLINICAL DATA:  Status post right hip arthroplasty. EXAM: PORTABLE PELVIS 1-2 VIEWS COMPARISON:  Preoperative radiograph 08/28/2020 FINDINGS: Right hip hemiarthroplasty in expected alignment. There is no periprosthetic lucency or fracture. Recent postsurgical change includes air and edema in the soft tissues with lateral skin staples in place. IMPRESSION: Right hip hemiarthroplasty without immediate postoperative complication. Electronically Signed   By: Keith Rake M.D.   On: 08/30/2020 15:16   DG C-Arm 1-60 Min  Result Date: 08/30/2020 CLINICAL DATA:  Elective surgery. EXAM: OPERATIVE RIGHT HIP (WITH PELVIS IF PERFORMED) TECHNIQUE: Fluoroscopic spot image(s) were submitted for interpretation post-operatively. COMPARISON:  None. FINDINGS: Four fluoroscopic spot views of the pelvis and right hip obtained in the operating room. Right hip hemi arthroplasty has been placed. Fluoroscopy time 3 seconds. Dose 0.42 mGy. IMPRESSION: Procedural fluoroscopy for right hip arthroplasty. Electronically Signed   By: Keith Rake M.D.   On: 08/30/2020 15:15   DG HIP OPERATIVE UNILAT WITH PELVIS RIGHT  Result Date: 08/30/2020 CLINICAL DATA:  Elective surgery. EXAM:  OPERATIVE RIGHT HIP (WITH PELVIS IF PERFORMED) TECHNIQUE: Fluoroscopic spot image(s) were submitted for interpretation post-operatively. COMPARISON:  None. FINDINGS: Four fluoroscopic spot views of the pelvis and right hip obtained in the operating room. Right hip hemi arthroplasty has been placed. Fluoroscopy time 3 seconds. Dose 0.42 mGy. IMPRESSION: Procedural fluoroscopy for right hip arthroplasty. Electronically Signed   By: Keith Rake M.D.   On: 08/30/2020 15:15    Scheduled Meds:  amiodarone  100 mg Oral QODAY   aspirin  81 mg Oral BID WC   atorvastatin  10 mg Oral Daily   brimonidine  1 drop Both Eyes BID   Chlorhexidine Gluconate Cloth  6 each Topical Daily   clopidogrel  75 mg Oral Daily   docusate sodium  100 mg Oral BID   isosorbide mononitrate  10 mg Oral BID   latanoprost  1 drop Both Eyes QHS   mupirocin ointment  1 application Nasal BID   pantoprazole  40 mg Oral q morning   senna  1 tablet Oral BID   sertraline  50 mg Oral Daily   sucralfate  1 g Oral BID   vitamin B-12  1,000 mcg Oral Daily   Continuous Infusions:  tranexamic acid      LOS: 4 days   Kerney Elbe, DO [facility] PAGER is on AMION  If 7PM-7AM, please contact night-coverage [email or web address]

## 2020-09-02 ENCOUNTER — Encounter (HOSPITAL_COMMUNITY): Payer: Self-pay | Admitting: Orthopedic Surgery

## 2020-09-02 DIAGNOSIS — R197 Diarrhea, unspecified: Secondary | ICD-10-CM

## 2020-09-02 DIAGNOSIS — K582 Mixed irritable bowel syndrome: Secondary | ICD-10-CM

## 2020-09-02 LAB — MAGNESIUM: Magnesium: 1.9 mg/dL (ref 1.7–2.4)

## 2020-09-02 LAB — CBC
HCT: 27.6 % — ABNORMAL LOW (ref 36.0–46.0)
Hemoglobin: 9.3 g/dL — ABNORMAL LOW (ref 12.0–15.0)
MCH: 29.5 pg (ref 26.0–34.0)
MCHC: 33.7 g/dL (ref 30.0–36.0)
MCV: 87.6 fL (ref 80.0–100.0)
Platelets: 119 10*3/uL — ABNORMAL LOW (ref 150–400)
RBC: 3.15 MIL/uL — ABNORMAL LOW (ref 3.87–5.11)
RDW: 14.4 % (ref 11.5–15.5)
WBC: 6.6 10*3/uL (ref 4.0–10.5)
nRBC: 0 % (ref 0.0–0.2)

## 2020-09-02 LAB — COMPREHENSIVE METABOLIC PANEL
ALT: 15 U/L (ref 0–44)
AST: 33 U/L (ref 15–41)
Albumin: 2.9 g/dL — ABNORMAL LOW (ref 3.5–5.0)
Alkaline Phosphatase: 55 U/L (ref 38–126)
Anion gap: 6 (ref 5–15)
BUN: 22 mg/dL (ref 8–23)
CO2: 27 mmol/L (ref 22–32)
Calcium: 8.4 mg/dL — ABNORMAL LOW (ref 8.9–10.3)
Chloride: 100 mmol/L (ref 98–111)
Creatinine, Ser: 1.06 mg/dL — ABNORMAL HIGH (ref 0.44–1.00)
GFR, Estimated: 47 mL/min — ABNORMAL LOW (ref >60)
Glucose, Bld: 107 mg/dL — ABNORMAL HIGH (ref 70–99)
Potassium: 3.7 mmol/L (ref 3.5–5.1)
Sodium: 133 mmol/L — ABNORMAL LOW (ref 135–145)
Total Bilirubin: 1.4 mg/dL — ABNORMAL HIGH (ref 0.3–1.2)
Total Protein: 5.4 g/dL — ABNORMAL LOW (ref 6.5–8.1)

## 2020-09-02 LAB — TSH: TSH: 4.046 u[IU]/mL (ref 0.350–4.500)

## 2020-09-02 LAB — IRON AND TIBC
Iron: 17 ug/dL — ABNORMAL LOW (ref 28–170)
Saturation Ratios: 7 % — ABNORMAL LOW (ref 10.4–31.8)
TIBC: 232 ug/dL — ABNORMAL LOW (ref 250–450)
UIBC: 215 ug/dL

## 2020-09-02 LAB — RETICULOCYTES
Immature Retic Fract: 18.3 % — ABNORMAL HIGH (ref 2.3–15.9)
RBC.: 3.06 MIL/uL — ABNORMAL LOW (ref 3.87–5.11)
Retic Count, Absolute: 55.7 10*3/uL (ref 19.0–186.0)
Retic Ct Pct: 1.8 % (ref 0.4–3.1)

## 2020-09-02 LAB — RPR: RPR Ser Ql: NONREACTIVE

## 2020-09-02 LAB — VITAMIN B12: Vitamin B-12: 1215 pg/mL — ABNORMAL HIGH (ref 180–914)

## 2020-09-02 LAB — PHOSPHORUS: Phosphorus: 2.8 mg/dL (ref 2.5–4.6)

## 2020-09-02 LAB — FOLATE: Folate: 19.6 ng/mL (ref >5.9)

## 2020-09-02 LAB — FERRITIN: Ferritin: 154 ng/mL (ref 11–307)

## 2020-09-02 MED ORDER — K PHOS MONO-SOD PHOS DI & MONO 155-852-130 MG PO TABS
500.0000 mg | ORAL_TABLET | Freq: Once | ORAL | Status: AC
  Administered 2020-09-02: 500 mg via ORAL
  Filled 2020-09-02: qty 2

## 2020-09-02 MED ORDER — K PHOS MONO-SOD PHOS DI & MONO 155-852-130 MG PO TABS: 500.0000 mg | ORAL_TABLET | Freq: Two times a day (BID) | ORAL | Status: DC

## 2020-09-02 MED ORDER — POLYETHYLENE GLYCOL 3350 17 G PO PACK
17.0000 g | PACK | Freq: Once | ORAL | Status: AC
  Administered 2020-09-02: 17 g via ORAL
  Filled 2020-09-02: qty 1

## 2020-09-02 MED ORDER — POTASSIUM CHLORIDE CRYS ER 20 MEQ PO TBCR
40.0000 meq | EXTENDED_RELEASE_TABLET | Freq: Two times a day (BID) | ORAL | Status: AC
  Administered 2020-09-02 (×2): 40 meq via ORAL
  Filled 2020-09-02 (×2): qty 2

## 2020-09-02 NOTE — Progress Notes (Signed)
PROGRESS NOTE    Sheri Hensley  [id number] DOB: [birth year] DOA: 08/28/2020 PCP: Josetta Huddle, MD   Brief Narrative:   Sheri Hensley is a 87 year old Caucasian female with a past medical history significant for but not limited to proximal atrial fibrillation on antiplatelets with Plavix as well as other comorbidities presented via EMS after a fall at her home.  Hensley states that she is getting up along her back door before she went to bed as she normally does and she lost her balance and fell landing on her right side.  She reported pain in her right hip and was unable to stand.  She also had Sheri back of Sheri right side of her head on Sheri floor but did not lose consciousness.  She had her cell phone with her and call 911.  EMS placed her in a c-collar and brought to emergency room.  She is not hypoxic and did not have any complaints of chest pain.  She did report having a mild headache in Sheri back of her head but no visual changes.  She denies any numbness of her extremities or face.  Hensley was alert and oriented and she does take Plavix and amiodarone for proximal atrial fibrillation and is very independent lives alone.  She was admitted for a closed right hip fracture and orthopedics was consulted.  Pain control was initiated and Sheri plan is for a right hip Hemi anterior approach by Dr. Lyla Glassing on 08/30/20. She is POD3 now and delirium and confusion has resolved. Blood count had dropped a little so will need to continue to Monitor closely but is now improving   Assessment & Plan:   Principal Problem:   Closed right hip fracture (HCC) Active Problems:   AKI (acute kidney injury) ([place])   AF (paroxysmal atrial fibrillation) (HCC)   Hyponatremia    Closed right hip fracture s/p Right Hip Hemiarthroplasty, Anterior Approach POD 3 -Sheri Hensley has right hip fracture.  -Orthopedics has been consulted and she was made NPO after midnight per Ortho request. Now is s/p Right Hip  Hemiarthroplasty from Sheri Anterior Approach -Pain control with Dilaudid 0.5 mg IV every 2 as needed for moderate pain as added but Will stop this and add Acetaminophen 650 mg po q5hprn Mild Pain and C/w Tramadol 50 mg po q6hprn Moderate Pain -Bowl Regimen with Senna 8.6 mg po BID  -C/w Supportive Care with Ondansetron 4 mg po/IV q6hprn Nausae and Metaclopramide 5 mg po/IV q8hprn if Ondansetron is ineffective  -Will need PT evaluation and case management consult after ortho evaluates pt to arrange for rehab needs. PT/OT now recommending SNF; TOC consulted for Assistance with placement; Per my discussion with them may be a few days before authorization can be obtained -Orthopedic surgery recommending up with therapy advancing diet and weightbearing as tolerated -DVT prophylaxis they have recommended aspirin 81 mg p.o. twice daily, SCDs and TEDS   AKI (Acute Kidney Injury) vs CKD Stage 3b; But Suspect it is most likely CKD Stage 3b Metabolic Acidosis -IVF hydration with LR at 75 ml/hr to be continued -Hensley's BUN/Cr is trending down and went from 18/1.30 -> 16/1.25 -> 15/1.17 -> 22/1.27 -> 28/1.32 with repeat pending this AM -Hensley had a Metabolic Acidosis with a CO2 of 19, AG of 12, and Chloride Level of 97 Sheri day before yesterday and repeat CMP yesterday showed a CO2 of 25, AG of 8, and Chloride Level of 98 with repeat pending this AM  -Continue to hold  her losartan/hydrochlorothiazide combination pill for now and consider resuming in Sheri AM  -C/w IVF  -Recheck renal function and electrolytes in am -Avoid nephrotoxic medications, contrast dyes, hypotension and renally dose medications -Repeat CMP in a.m.  Irritable Bowel Syndrome now with Diarrhea -Having a lot of Loose Runny stools now -Will hold Her Stool Softens of Senna and Docusate  -Continue to Monitor Electrolytes -Doubt C Diff as she is Afebrile and has no leukocytosis -Continue to Monitor of off Stool Softeners and Laxatives     AF (paroxysmal atrial fibrillation)  -Has history of paroxysmal atrial fibrillation per daughter.  Is on amiodarone per daughter and have resumed Amiodarone 100 mg po EOD -She is on Plavix which is being held for surgery but now resumed today but may need to hold further if she continues to bleed.  She is not on DOAC or Coumadin secondary to fall risks -Continue to Monitor closely     Hyponatremia -C/w LR IVF hydration. Recheck electrolytes in am -Na+ went from 129 -> 130 -> 130 -> 128 -> 128 -> 131 with repeat pending this AM  -Continue to Monitor and Trend -Repeat CMP in Sheri AM  Post-Operative Acute Blood Loss Anemia superimposed on Normocytic Anemia  -Hensley's Hgb/Hct went from 10.9/33.4 -> 10.9/32.0 -> 11.3/34.2  -> 9.9/29.3 -> 8.7/25.7 and repeat this AM was 9.3/27.6 and stable -Checking Anemia Panel this AM -Continue to Monitor for S/Sx of Bleeding; No overt bleeding noted -Repeat CBC in Sheri AM   Thrombocytopenia -Hensley's Platelet Count went from 130 -> 127 -> 121 -> 114 -> 119 -Continue to monitor for S/Sx of Bleeding; No overt Bleeding noted -Repeat CBC in Sheri AM    GERD -C/w PPI with Pantoprazole 40 mg every Morning  -C/w Sucralfate 1 gram po BID     HLD -C/w Atorvastatin 10 mg po Daily   Confusion and Agitation/ Delirium, improved significantly  -Post-Operatively and likely an effect of Anesthesia and Pain meds  -Delirium Precautions -Minimize Narcotics and stop Hydromorphone -Continue to Reorient and if persists may pursue Head Imaging -Was Check TSH, RPR, B12 and they are still  pending to be done  -Hensley is much more improved and back to baseline   Depression and Anxiety -C/w Sertraline 50 mg po Daily   Hypokalemia -Hensley's K+ was 3.2 -Replete with po Kcl 40 mEQ BID x2 and po K Phos Neutral 500 mg x1 -Continue to Monitor and Replete as Necessary -Mag Level was 1.9 -Repeat CMP in Sheri AM   Hypophosphatemia -Hensley's Phos Level was 2.3 -Replete  with po K Phos Neutral 500 mg x1 -Continue to Monitor and Replete as Necessary  -Repeat Phos Level in Sheri AM   HTN -Currently Holding Amlodipine 2.5 mg po Daily and Losartan-HCTZ 1 tab po Daily  -Continue to Monitor BP per Protocol -Last BP was 127/57 and not repeated yet for some reason  Hyperbilirubinemia, improving  -Likely Reactive -T Bili went from 1.1 -> 1.9 -> 1.4 -> 1.2 and repeat this AM pending  -Continue to Monitor and Trend -Repeat CMP in Sheri AM    DVT prophylaxis: SCDs Code Status: FULL CODE Family Communication: Discussed with Granddaughter at bedside  Disposition Plan: Pending further clinical improvement and now PT/OT recommending SNF; Needs Insurance Authorization   Status is: Inpatient  Remains inpatient appropriate because:Unsafe d/c plan, IV treatments appropriate due to intensity of illness or inability to take PO, and Inpatient level of care appropriate due to severity of illness  Dispo: Sheri Hensley  is from: Home              Anticipated d/c is to:  SNF              Hensley currently is not medically stable to d/c.   Difficult to place Hensley No  Consultants:  Orthopedic Surgery   Procedures: Right hip hemiarthroplasty, anterior approach  Antimicrobials:  Anti-infectives (From admission, onward)    Start     Dose/Rate Route Frequency Ordered Stop   08/30/20 2100  ceFAZolin (ANCEF) IVPB 2g/100 mL premix        2 g 200 mL/hr over 30 Minutes Intravenous Every 12 hours 08/30/20 1839 08/30/20 2219   08/30/20 1900  ceFAZolin (ANCEF) IVPB 2g/100 mL premix  Status:  Discontinued        2 g 200 mL/hr over 30 Minutes Intravenous Every 6 hours 08/30/20 1807 08/30/20 1838   08/30/20 0600  ceFAZolin (ANCEF) IVPB 2g/100 mL premix        2 g 200 mL/hr over 30 Minutes Intravenous On call to O.R. 08/29/20 2008 08/30/20 0920        Subjective: Seen and examined at bedside and states she was not doing good today and states she was constipated yesterday but now  having liquidy bowel movements and had to be cleaned up. No CP or SOB. Had some crampy abdominal pain with her bowel movements but improved now. Awaiting SNF placement and daughter to pick a place and Insurance Authorization needs to be obtained.   Objective: Vitals:   09/01/20 1525 09/01/20 2100 09/01/20 2114 09/02/20 0600  BP: (!) 142/61 (!) 148/79 (!) 148/69 (!) 158/75  Pulse: 74 75 75 78  Resp: '16 16  18  '$ Temp: 98.3 F (36.8 C) 98.6 F (37 C) 98.4 F (36.9 C) 98.7 F (37.1 C)  TempSrc: Oral Oral Oral Oral  SpO2: 93% 94% 94% 93%  Weight:      Height:        Intake/Output Summary (Last 24 hours) at 09/02/2020 [id number] Last data filed at 09/02/2020 0000 Gross per 24 hour  Intake 480 ml  Output 1 ml  Net 479 ml    Filed Weights   08/28/20 2203  Weight: 60.3 kg   Examination:  Physical Exam:  Constitutional: Thin elderly Caucasian female in NAD and appears anxious and a little uncomfortable Eyes: Lids and conjunctivae normal, sclerae anicteric  ENMT: External Ears, Nose appear normal. A little hard of hearing without her hearing aides Neck: Appears normal, supple, no cervical masses, normal ROM, no appreciable thyromegaly Respiratory: Diminished to auscultation bilaterally, no wheezing, rales, rhonchi or crackles. Normal respiratory effort and Hensley is not tachypenic. No accessory muscle use. Unlabored breathing  Cardiovascular: RRR, no murmurs / rubs / gallops. S1 and S2 auscultated. No extremity edema.  Abdomen: Soft, non-tender, non-distended. Bowel sounds positive.  GU: Deferred. Musculoskeletal: No clubbing / cyanosis of digits/nails. No joint deformity upper and lower extremities.  Skin: No rashes, lesions, ulcers on a limited skin evaluation. No induration; Warm and dry.  Neurologic: CN 2-12 grossly intact with no focal deficits. Romberg sign cerebellar reflexes not assessed.  Psychiatric: Normal judgment and insight. Alert and oriented x 3. Anxious mood and appropriate  affect.   Data Reviewed: I have personally reviewed following labs and imaging studies  CBC: Recent Labs  Lab 08/28/20 2209 08/28/20 2213 08/30/20 0309 08/31/20 0121 09/01/20 1026 09/02/20 0721  WBC 7.7  --  10.3 10.2 7.0 6.6  NEUTROABS  --   --  8.8* 9.0* 5.8  --   HGB 10.9* 10.9* 11.3* 9.9* 8.7* 9.3*  HCT 33.4* 32.0* 34.2* 29.3* 25.7* 27.6*  MCV 89.1  --  87.5 88.0 87.4 87.6  PLT 130*  --  127* 121* 114* 119*    Basic Metabolic Panel: Recent Labs  Lab 08/28/20 2209 08/28/20 2213 08/29/20 0500 08/30/20 0309 08/31/20 0121 09/01/20 1026  NA 129* 130* 130* 128* 128* 131*  K 3.8 3.8 4.0 3.6 4.1 3.2*  CL 93* 95* 95* 93* 97* 98  CO2 24  --  23 26 19* 25  GLUCOSE 149* 148* 112* 120* 105* 138*  BUN '17 18 16 15 22 '$ 28*  CREATININE 1.32* 1.30* 1.25* 1.17* 1.27* 1.32*  CALCIUM 9.0  --  9.6 9.3 8.5* 8.3*  MG  --   --   --  1.7 1.8 1.9  PHOS  --   --   --  3.4 2.6 2.3*    GFR: Estimated Creatinine Clearance: 19.7 mL/min (A) (by C-G formula based on SCr of 1.32 mg/dL (H)). Liver Function Tests: Recent Labs  Lab 08/28/20 2209 08/30/20 0309 08/31/20 0121 09/01/20 1026  AST 29 34 54* 43*  ALT '19 21 19 14  '$ ALKPHOS 78 75 57 54  BILITOT 1.1 1.9* 1.4* 1.2  PROT 6.2* 6.4* 5.6* 5.4*  ALBUMIN 4.0 3.7 3.1* 3.0*    No results for input(s): LIPASE, AMYLASE in Sheri last 168 hours. No results for input(s): AMMONIA in Sheri last 168 hours. Coagulation Profile: Recent Labs  Lab 08/28/20 2209  INR 1.1    Cardiac Enzymes: No results for input(s): CKTOTAL, CKMB, CKMBINDEX, TROPONINI in Sheri last 168 hours. BNP (last 3 results) No results for input(s): PROBNP in Sheri last 8760 hours. HbA1C: No results for input(s): HGBA1C in Sheri last 72 hours. CBG: No results for input(s): GLUCAP in Sheri last 168 hours. Lipid Profile: No results for input(s): CHOL, HDL, LDLCALC, TRIG, CHOLHDL, LDLDIRECT in Sheri last 72 hours. Thyroid Function Tests: No results for input(s): TSH, T4TOTAL, FREET4,  T3FREE, THYROIDAB in Sheri last 72 hours. Anemia Panel: No results for input(s): VITAMINB12, FOLATE, FERRITIN, TIBC, IRON, RETICCTPCT in Sheri last 72 hours. Sepsis Labs: Recent Labs  Lab 08/28/20 2209  LATICACIDVEN 2.2*     Recent Results (from Sheri past 240 hour(s))  Resp Panel by RT-PCR (Flu A&B, Covid) Nasopharyngeal Swab     Status: None   Collection Time: 08/28/20 10:37 PM   Specimen: Nasopharyngeal Swab; Nasopharyngeal(NP) swabs in vial transport medium  Result Value Ref Range Status   SARS Coronavirus 2 by RT PCR NEGATIVE NEGATIVE Final    Comment: (NOTE) SARS-CoV-2 target nucleic acids are NOT DETECTED.  Sheri SARS-CoV-2 RNA is generally detectable in upper respiratory specimens during Sheri acute phase of infection. Sheri lowest concentration of SARS-CoV-2 viral copies this assay can detect is 138 copies/mL. A negative result does not preclude SARS-Cov-2 infection and should not be used as Sheri sole basis for treatment or other Hensley management decisions. A negative result may occur with  improper specimen collection/handling, submission of specimen other than nasopharyngeal swab, presence of viral mutation(s) within Sheri areas targeted by this assay, and inadequate number of viral copies(<138 copies/mL). A negative result must be combined with clinical observations, Hensley history, and epidemiological information. Sheri expected result is Negative.  Fact Sheet for Patients:  EntrepreneurPulse.com.au  Fact Sheet for Healthcare Providers:  IncredibleEmployment.be  This test is no t yet approved or cleared by Sheri [place] FDA and  has been  authorized for detection and/or diagnosis of SARS-CoV-2 by FDA under an Emergency Use Authorization (EUA). This EUA will remain  in effect (meaning this test can be used) for Sheri duration of Sheri COVID-19 declaration under Section 564(b)(1) of Sheri Act, 21 U.S.C.section 360bbb-3(b)(1), unless Sheri  authorization is terminated  or revoked sooner.       Influenza A by PCR NEGATIVE NEGATIVE Final   Influenza B by PCR NEGATIVE NEGATIVE Final    Comment: (NOTE) Sheri Xpert Xpress SARS-CoV-2/FLU/RSV plus assay is intended as an aid in Sheri diagnosis of influenza from Nasopharyngeal swab specimens and should not be used as a sole basis for treatment. Nasal washings and aspirates are unacceptable for Xpert Xpress SARS-CoV-2/FLU/RSV testing.  Fact Sheet for Patients: EntrepreneurPulse.com.au  Fact Sheet for Healthcare Providers: IncredibleEmployment.be  This test is not yet approved or cleared by Sheri [place] FDA and has been authorized for detection and/or diagnosis of SARS-CoV-2 by FDA under an Emergency Use Authorization (EUA). This EUA will remain in effect (meaning this test can be used) for Sheri duration of Sheri COVID-19 declaration under Section 564(b)(1) of Sheri Act, 21 U.S.C. section 360bbb-3(b)(1), unless Sheri authorization is terminated or revoked.  Performed at [facility], [place] [address], [place], [place] [address]   Surgical PCR screen     Status: None   Collection Time: 08/29/20 10:59 PM   Specimen: Nasal Mucosa; Nasal Swab  Result Value Ref Range Status   MRSA, PCR NEGATIVE NEGATIVE Final   Staphylococcus aureus NEGATIVE NEGATIVE Final    Comment: (NOTE) Sheri Xpert SA Assay (FDA approved for NASAL specimens in patients 56 years of age and older), is one component of a comprehensive surveillance program. It is not intended to diagnose infection nor to guide or monitor treatment. Performed at [facility], [place] [address] [place], [place] [address]     RN Pressure Injury Documentation:     Estimated body mass index is 23.56 kg/m as calculated from Sheri following:   Height as of this encounter: '5\' 3"'$  (1.6 m).   Weight as of this encounter: 60.3 kg.  Malnutrition Type:   Malnutrition Characteristics:    Nutrition Interventions:     Radiology Studies: No results found.  Scheduled Meds:  amiodarone  100 mg Oral QODAY   aspirin  81 mg Oral BID WC   atorvastatin  10 mg Oral Daily   brimonidine  1 drop Both Eyes BID   Chlorhexidine Gluconate Cloth  6 each Topical Daily   clopidogrel  75 mg Oral Daily   docusate sodium  100 mg Oral BID   isosorbide mononitrate  10 mg Oral BID   latanoprost  1 drop Both Eyes QHS   mupirocin ointment  1 application Nasal BID   pantoprazole  40 mg Oral q morning   phosphorus  500 mg Oral BID   potassium chloride  40 mEq Oral BID   senna  1 tablet Oral BID   sertraline  50 mg Oral Daily   sucralfate  1 g Oral BID   vitamin B-12  1,000 mcg Oral Daily   Continuous Infusions:  tranexamic acid      LOS: 5 days   Kerney Elbe, DO [facility] PAGER is on AMION  If 7PM-7AM, please contact night-coverage [email or web address]

## 2020-09-02 NOTE — Progress Notes (Signed)
Pt had difficulty passing BM in the middle of night. Pt encouraged PO liquids, finally had a hard bowel movement. Pt already on PO stool softeners. Was able to get an order for miralax to aid in BM. Pt took medicine and had a bowel movement and stated she still had more, encouraged to finish miralax to aid in relief of self. Milta Deiters H.Caddie Randle 09/02/20 1:50 AM

## 2020-09-02 NOTE — TOC Progression Note (Signed)
Transition of Care [facility]) - Progression Note    Patient Details  Name: Sheri Hensley MRN: [id number] Date of Birth: February 08, 1919  Transition of Care [facility] CM/SW Contact  Milinda Antis, [place] Phone Number: 09/02/2020, 9:59 AM  Clinical Narrative:    09:40-  CSW spoke with the patient's daughter, Sheri Hensley, and presented bed offers.  Ms. [name] stated that she was not willing to put the patient in a substandard facility.  CSW explained that only certain facilities are in network with [facility]  Ms. [name] reported that she would review the facilities that have given bed offers and decide on whether to send the patient to a facility or take the patient home.          Expected Discharge Plan and Services                                                 Social Determinants of Health (SDOH) Interventions    Readmission Risk Interventions No flowsheet data found.

## 2020-09-02 NOTE — Progress Notes (Signed)
Late entry 09/02/2020 @ 1000  NCM received consult for possible SNF placement at time of discharge. Pt lives alone. Has a caregiver from 9am-2pm , M-F. Daughter Vickie lives next door. NCM given verbal consent to speak with daughter Loletha Carrow  regarding d/c needs. NCM shared with daughter PT's recommendation for SNF placement at time of discharge. Daughter reports she  is currently unable to care for patient  given patient's current physical needs and fall risk. Daughter expressed understanding of PT recommendation and is agreeable to SNF placement at time of discharge. Reports preference for [facility]. NCM discussed insurance authorization process and provided Medicare SNF ratings list. No further questions reported at this time. RNCM to continue to follow and assist with discharge planning needs.  Pt fully COVID vaccinated with booster x 2. Whitman Hero RN,BSN,CM

## 2020-09-02 NOTE — Care Management Important Message (Signed)
Important Message  Patient Details  Name: Sheri Hensley MRN: [id number] Date of Birth: November 10, 1919   Medicare Important Message Given:  Yes     Orbie Pyo 09/02/2020, 4:25 PM

## 2020-09-03 ENCOUNTER — Encounter (HOSPITAL_COMMUNITY): Payer: Self-pay | Admitting: Family Medicine

## 2020-09-03 LAB — CBC WITH DIFFERENTIAL/PLATELET
Abs Immature Granulocytes: 0.02 10*3/uL (ref 0.00–0.07)
Basophils Absolute: 0 10*3/uL (ref 0.0–0.1)
Basophils Relative: 0 %
Eosinophils Absolute: 0.2 10*3/uL (ref 0.0–0.5)
Eosinophils Relative: 4 %
HCT: 25.5 % — ABNORMAL LOW (ref 36.0–46.0)
Hemoglobin: 8.4 g/dL — ABNORMAL LOW (ref 12.0–15.0)
Immature Granulocytes: 0 %
Lymphocytes Relative: 14 %
Lymphs Abs: 0.7 10*3/uL (ref 0.7–4.0)
MCH: 29.3 pg (ref 26.0–34.0)
MCHC: 32.9 g/dL (ref 30.0–36.0)
MCV: 88.9 fL (ref 80.0–100.0)
Monocytes Absolute: 0.4 10*3/uL (ref 0.1–1.0)
Monocytes Relative: 8 %
Neutro Abs: 3.7 10*3/uL (ref 1.7–7.7)
Neutrophils Relative %: 74 %
Platelets: 120 10*3/uL — ABNORMAL LOW (ref 150–400)
RBC: 2.87 MIL/uL — ABNORMAL LOW (ref 3.87–5.11)
RDW: 14.4 % (ref 11.5–15.5)
WBC: 5.1 10*3/uL (ref 4.0–10.5)
nRBC: 0 % (ref 0.0–0.2)

## 2020-09-03 LAB — BPAM RBC
Blood Product Expiration Date: 202209012359
Blood Product Expiration Date: 202209082359
Unit Type and Rh: 9500
Unit Type and Rh: 9500

## 2020-09-03 LAB — TYPE AND SCREEN
ABO/RH(D): O NEG
Antibody Screen: NEGATIVE
Unit division: 0
Unit division: 0

## 2020-09-03 LAB — COMPREHENSIVE METABOLIC PANEL
ALT: 17 U/L (ref 0–44)
AST: 28 U/L (ref 15–41)
Albumin: 2.7 g/dL — ABNORMAL LOW (ref 3.5–5.0)
Alkaline Phosphatase: 53 U/L (ref 38–126)
Anion gap: 5 (ref 5–15)
BUN: 21 mg/dL (ref 8–23)
CO2: 26 mmol/L (ref 22–32)
Calcium: 8.6 mg/dL — ABNORMAL LOW (ref 8.9–10.3)
Chloride: 105 mmol/L (ref 98–111)
Creatinine, Ser: 1.11 mg/dL — ABNORMAL HIGH (ref 0.44–1.00)
GFR, Estimated: 45 mL/min — ABNORMAL LOW (ref >60)
Glucose, Bld: 113 mg/dL — ABNORMAL HIGH (ref 70–99)
Potassium: 4.8 mmol/L (ref 3.5–5.1)
Sodium: 136 mmol/L (ref 135–145)
Total Bilirubin: 1.3 mg/dL — ABNORMAL HIGH (ref 0.3–1.2)
Total Protein: 5.2 g/dL — ABNORMAL LOW (ref 6.5–8.1)

## 2020-09-03 LAB — MAGNESIUM: Magnesium: 1.9 mg/dL (ref 1.7–2.4)

## 2020-09-03 LAB — PHOSPHORUS: Phosphorus: 3.4 mg/dL (ref 2.5–4.6)

## 2020-09-03 NOTE — TOC Progression Note (Addendum)
Transition of Care [facility]) - Progression Note    Patient Details  Name: Sheri Hensley MRN: [id number] Date of Birth: 1919-04-11  Transition of Care [facility]) CM/SW Contact  Milinda Antis, [place] Phone Number: 09/03/2020, 11:23 AM  Clinical Narrative:     08:34-  CSW spoke with the patient's daughter Loletha Carrow [place] to inquire about SNF vs HH at d/c.  Ms. [name] stated that they would like to take the patient home.  The patient has had home health previously, but the daughter could not remember the name of the agency and reported that the family does not have a preference.  The patient currently has a walker, cane, and bedside commode.   Care team notified.  11:58- CSW spoke with Claiborne Billings at [facility].  The facility will be able to accept the patient if she is fully vaccinated.  CSW called and informed the family of the new bed offer.  The family requested to take a tour of the facility.  CSW scheduled the tour and asked that the family provide verification of the patient's Covid Vaccinations to the facility.    15:17-  CSW was contacted by Claiborne Billings at [place].  The family has accepted the bed offer and the facility is starting insurance authorization.    Pending:  insurance auth.       Expected Discharge Plan and Services                                                 Social Determinants of Health (SDOH) Interventions    Readmission Risk Interventions No flowsheet data found.

## 2020-09-03 NOTE — Plan of Care (Signed)

## 2020-09-03 NOTE — Progress Notes (Signed)
Physical Therapy Treatment Patient Details Name: Sheri Hensley MRN: [id number] DOB: 1919-09-20 Today's Date: 09/03/2020    History of Present Illness Pt is a 87 y/o female admitted after a fall in which she sustained a R femoral neck fracture. She is now s/p R hip hemiarthroplasty. PMH including but not limited to a-fib.    PT Comments    Pt is making good progress with mobility, progressing from needing modA to transfer to stand at start of session to only needing minA by the second rep. In addition, she was able to ambulate up to ~12 ft with a RW and minA this date. She ambulates at a very slow pace and displays unsteadiness that places her at risk for falls. Performed supine lower extremity exercises for warm-up prior to mobility and educated pt on performing them as HEP. Will continue to follow acutely. Current recommendations remain appropriate.    Follow Up Recommendations  SNF;Supervision/Assistance - 24 hour     Equipment Recommendations  None recommended by PT    Recommendations for Other Services       Precautions / Restrictions Precautions Precautions: Fall Precaution Comments: HOH Restrictions Weight Bearing Restrictions: Yes RLE Weight Bearing: Weight bearing as tolerated    Mobility  Bed Mobility Overal bed mobility: Needs Assistance Bed Mobility: Supine to Sit     Supine to sit: Min assist;HOB elevated     General bed mobility comments: Tactile and verbal cues to slide one leg then other off R EOB, assisting at R leg and to initiate trunk ascension with pt using bed rails.    Transfers Overall transfer level: Needs assistance Equipment used: Rolling walker (2 wheeled) Transfers: Sit to/from [facility] Sit to Stand: Mod assist;Min assist Stand pivot transfers: Min assist       General transfer comment: Sit to stand 1x from EOB with modA but improved to minA for second transfer rep from recliner, cuing pt to scoot anteriorly,  flex knees for improved feet placement, and push up from current sitting surface. MinA to manage RW and direct pt to stand step transfer towards R bed > recliner.  Ambulation/Gait Ambulation/Gait assistance: Min assist Gait Distance (Feet): 12 Feet (x2 bouts of ~3 ft > ~12 ft) Assistive device: Rolling walker (2 wheeled) Gait Pattern/deviations: Step-through pattern;Decreased step length - right;Decreased stride length;Decreased weight shift to right;Decreased stance time - right;Antalgic;Trunk flexed Gait velocity: reduced Gait velocity interpretation: <1.31 ft/sec, indicative of household ambulator General Gait Details: Pt with kyphotic posture and antalgic gait pattern, ambulating at slow pace with unsteadiness. MinA to steady and manage RW. Cues to improve posture and to turn RW.   Stairs             Wheelchair Mobility    Modified Rankin (Stroke Patients Only) Modified Rankin (Stroke Patients Only) Pre-Morbid Rankin Score: Moderate disability Modified Rankin: Moderately severe disability     Balance Overall balance assessment: Needs assistance Sitting-balance support: Feet supported Sitting balance-Leahy Scale: Fair Sitting balance - Comments: Static sitting with supervision.   Standing balance support: Bilateral upper extremity supported;During functional activity Standing balance-Leahy Scale: Poor Standing balance comment: Reliant on UE support.                            Cognition Arousal/Alertness: Awake/alert Behavior During Therapy: WFL for tasks assessed/performed Overall Cognitive Status: Impaired/Different from baseline Area of Impairment: Memory;Following commands;Safety/judgement;Awareness;Problem solving  Memory: Decreased short-term memory Following Commands: Follows one step commands with increased time;Follows one step commands consistently Safety/Judgement: Decreased awareness of deficits;Decreased awareness  of safety Awareness: Emergent Problem Solving: Slow processing;Decreased initiation;Difficulty sequencing;Requires verbal cues General Comments: Pt repeatedly asking why her R hip feels tight/pulling, even though educated multiple times. Simple multi-modal cues provided throughout with pt needing increased time to process and initiate, possibly due to poor hearing.      Exercises General Exercises - Lower Extremity Ankle Circles/Pumps: AROM;Both;10 reps;Supine Quad Sets: AROM;Both;10 reps;Supine Heel Slides: AROM;AAROM;Both;10 reps;Supine (AAROM at R leg) Hip ABduction/ADduction: AROM;AAROM;Both;10 reps;Supine (AAROM at R leg)    General Comments General comments (skin integrity, edema, etc.): Educated pt to perform ankle pumps and quad sets as HEP      Pertinent Vitals/Pain Pain Assessment: Faces Faces Pain Scale: Hurts even more Pain Location: R hip Pain Descriptors / Indicators: Sore;Discomfort;Tightness;Other (Comment) ("pulling") Pain Intervention(s): Limited activity within patient's tolerance;Monitored during session;Repositioned    Home Living                      Prior Function            PT Goals (current goals can now be found in the care plan section) Acute Rehab PT Goals Patient Stated Goal: to improve and walk again PT Goal Formulation: With patient Time For Goal Achievement: 09/14/20 Potential to Achieve Goals: Good Progress towards PT goals: Progressing toward goals    Frequency    Min 5X/week      PT Plan Current plan remains appropriate    Co-evaluation              AM-PAC PT "6 Clicks" Mobility   Outcome Measure  Help needed turning from your back to your side while in a flat bed without using bedrails?: A Little Help needed moving from lying on your back to sitting on the side of a flat bed without using bedrails?: A Little Help needed moving to and from a bed to a chair (including a wheelchair)?: A Little Help needed standing  up from a chair using your arms (e.g., wheelchair or bedside chair)?: A Little Help needed to walk in hospital room?: A Little Help needed climbing 3-5 steps with a railing? : Total 6 Click Score: 16    End of Session Equipment Utilized During Treatment: Gait belt Activity Tolerance: Patient tolerated treatment well Patient left: in chair;with call bell/phone within reach;with chair alarm set Nurse Communication: Mobility status;Other (comment) (plan to have pt start transferring to commode with nursing for bathroom needs) PT Visit Diagnosis: Other abnormalities of gait and mobility (R26.89);Pain;Unsteadiness on feet (R26.81);Muscle weakness (generalized) (M62.81);Difficulty in walking, not elsewhere classified (R26.2) Pain - Right/Left: Right Pain - part of body: Hip     Time: [id number] PT Time Calculation (min) (ACUTE ONLY): 32 min  Charges:  $Gait Training: 8-22 mins $Therapeutic Exercise: 8-22 mins                     Moishe Spice, PT, DPT Acute Rehabilitation Services  Pager: [phone] Office: [place] 09/03/2020, 9:37 AM

## 2020-09-03 NOTE — Progress Notes (Addendum)
Progress Note    Sheri Hensley  [id number] DOB: [birth year]  DOA: 08/28/2020 PCP: Josetta Huddle, MD      Brief Narrative:    Medical records reviewed and are as summarized below:  Sheri Hensley is a 87 y.o. female with medical history significant for paroxysmal atrial fibrillation on Plavix, CKD stage IIIb, hypertension, anxiety, depression, hyperlipidemia, GERD, who presented to the hospital after a fall at home.  She was found to have closed displaced right hip fracture.  She underwent right hip hemiarthroplasty on 08/30/2020.  Hospital course was complicated by acute confusional state that was attributed to delirium probably from anesthetics versus opioid analgesics.  She also had hyponatremia that improved with fluids.  She had hypokalemia and hypophosphatemia that were repleted.  She developed acute blood loss anemia but she did not require blood transfusion.  She was evaluated by PT and OT who recommended further rehabilitation at a skilled nursing facility.    Assessment/Plan:   Principal Problem:   Closed right hip fracture (HCC) Active Problems:   AKI (acute kidney injury) ([place])   AF (paroxysmal atrial fibrillation) (HCC)   Hyponatremia    Body mass index is 23.56 kg/m.   Closed right hip fracture: S/p right hip hemiarthroplasty on 08/30/2020.  Continue analgesics as needed for pain.  Acute postoperative blood loss anemia: H&H is stable.  No indication for blood transfusion at this time.  Delirium: Improved and she is back to baseline.  Thrombocytopenia: Unknown whether this is chronic.  CKD stage IIIb: Creatinine is stable.  No AKI.  Hypokalemia, hyponatremia, hypophosphatemia: Resolved  Other comorbidities include anxiety, depression, hypertension, hyperlipidemia, GERD.  She is medically stable for discharge but awaiting placement to SNF.  Diet Order             Diet regular Room service appropriate? Yes; Fluid consistency: Thin   Diet effective now                      Consultants: Orthopedic surgeon  Procedures: S/p right hip hemiarthroplasty on 08/30/2020    Medications:    amiodarone  100 mg Oral QODAY   aspirin  81 mg Oral BID WC   atorvastatin  10 mg Oral Daily   brimonidine  1 drop Both Eyes BID   Chlorhexidine Gluconate Cloth  6 each Topical Daily   clopidogrel  75 mg Oral Daily   isosorbide mononitrate  10 mg Oral BID   latanoprost  1 drop Both Eyes QHS   mupirocin ointment  1 application Nasal BID   pantoprazole  40 mg Oral q morning   sertraline  50 mg Oral Daily   sucralfate  1 g Oral BID   vitamin B-12  1,000 mcg Oral Daily   Continuous Infusions:  tranexamic acid       Anti-infectives (From admission, onward)    Start     Dose/Rate Route Frequency Ordered Stop   08/30/20 2100  ceFAZolin (ANCEF) IVPB 2g/100 mL premix        2 g 200 mL/hr over 30 Minutes Intravenous Every 12 hours 08/30/20 1839 08/30/20 2219   08/30/20 1900  ceFAZolin (ANCEF) IVPB 2g/100 mL premix  Status:  Discontinued        2 g 200 mL/hr over 30 Minutes Intravenous Every 6 hours 08/30/20 1807 08/30/20 1838   08/30/20 0600  ceFAZolin (ANCEF) IVPB 2g/100 mL premix        2 g 200 mL/hr over 30  Minutes Intravenous On call to O.R. 08/29/20 2008 08/30/20 0920              Family Communication/Anticipated D/C date and plan/Code Status   DVT prophylaxis: SCDs Start: 08/30/20 1808     Code Status: Full Code  Family Communication: Plan discussed with Jocelyn Lamer, daughter, at the bedside Disposition Plan:    Status is: Inpatient  Remains inpatient appropriate because:Unsafe d/c plan  Dispo: The patient is from: Home              Anticipated d/c is to: SNF              Patient currently is medically stable to d/c.   Difficult to place patient Yes           Subjective:   C/o right hip pain following session with physical therapist this morning.  Objective:    Vitals:   09/02/20  0600 09/02/20 1552 09/02/20 2027 09/03/20 0745  BP: (!) 158/75 121/68 (!) 155/60 (!) 153/65  Pulse: 78 74 72 67  Resp: '18 17  15  '$ Temp: 98.7 F (37.1 C) 98.2 F (36.8 C) 98 F (36.7 C) 98.8 F (37.1 C)  TempSrc: Oral Oral Oral Oral  SpO2: 93% 96% 94% 94%  Weight:      Height:       No data found.   Intake/Output Summary (Last 24 hours) at 09/03/2020 1031 Last data filed at 09/03/2020 0900 Gross per 24 hour  Intake 240 ml  Output --  Net 240 ml   Filed Weights   08/28/20 2203  Weight: 60.3 kg    Exam:  GEN: NAD SKIN: Warm and dry EYES: EOMI ENT: MMM CV: RRR PULM: CTA B ABD: soft, ND, NT, +BS CNS: AAO x 3, non focal EXT: Right hip tenderness.  Dressing on right hip surgical wound is clean, dry and intact.        Data Reviewed:   I have personally reviewed following labs and imaging studies:  Labs: Labs show the following:   Basic Metabolic Panel: Recent Labs  Lab 08/30/20 0309 08/31/20 0121 09/01/20 1026 09/02/20 0721 09/03/20 0237  NA 128* 128* 131* 133* 136  K 3.6 4.1 3.2* 3.7 4.8  CL 93* 97* 98 100 105  CO2 26 19* '25 27 26  '$ GLUCOSE 120* 105* 138* 107* 113*  BUN 15 22 28* 22 21  CREATININE 1.17* 1.27* 1.32* 1.06* 1.11*  CALCIUM 9.3 8.5* 8.3* 8.4* 8.6*  MG 1.7 1.8 1.9 1.9 1.9  PHOS 3.4 2.6 2.3* 2.8 3.4   GFR Estimated Creatinine Clearance: 23.4 mL/min (A) (by C-G formula based on SCr of 1.11 mg/dL (H)). Liver Function Tests: Recent Labs  Lab 08/30/20 0309 08/31/20 0121 09/01/20 1026 09/02/20 0721 09/03/20 0237  AST 34 54* 43* 33 28  ALT '21 19 14 15 17  '$ ALKPHOS 75 57 54 55 53  BILITOT 1.9* 1.4* 1.2 1.4* 1.3*  PROT 6.4* 5.6* 5.4* 5.4* 5.2*  ALBUMIN 3.7 3.1* 3.0* 2.9* 2.7*   No results for input(s): LIPASE, AMYLASE in the last 168 hours. No results for input(s): AMMONIA in the last 168 hours. Coagulation profile Recent Labs  Lab 08/28/20 2209  INR 1.1    CBC: Recent Labs  Lab 08/30/20 0309 08/31/20 0121 09/01/20 1026  09/02/20 0721 09/03/20 0237  WBC 10.3 10.2 7.0 6.6 5.1  NEUTROABS 8.8* 9.0* 5.8  --  3.7  HGB 11.3* 9.9* 8.7* 9.3* 8.4*  HCT 34.2* 29.3* 25.7* 27.6* 25.5*  MCV 87.5 88.0 87.4 87.6 88.9  PLT 127* 121* 114* 119* 120*   Cardiac Enzymes: No results for input(s): CKTOTAL, CKMB, CKMBINDEX, TROPONINI in the last 168 hours. BNP (last 3 results) No results for input(s): PROBNP in the last 8760 hours. CBG: No results for input(s): GLUCAP in the last 168 hours. D-Dimer: No results for input(s): DDIMER in the last 72 hours. Hgb A1c: No results for input(s): HGBA1C in the last 72 hours. Lipid Profile: No results for input(s): CHOL, HDL, LDLCALC, TRIG, CHOLHDL, LDLDIRECT in the last 72 hours. Thyroid function studies: Recent Labs    09/02/20 0721  TSH 4.046   Anemia work up: Recent Labs    09/02/20 0721  VITAMINB12 1,215*  FOLATE 19.6  FERRITIN 154  TIBC 232*  IRON 17*  RETICCTPCT 1.8   Sepsis Labs: Recent Labs  Lab 08/28/20 2209 08/30/20 0309 08/31/20 0121 09/01/20 1026 09/02/20 0721 09/03/20 0237  WBC 7.7   < > 10.2 7.0 6.6 5.1  LATICACIDVEN 2.2*  --   --   --   --   --    < > = values in this interval not displayed.    Microbiology Recent Results (from the past 240 hour(s))  Resp Panel by RT-PCR (Flu A&B, Covid) Nasopharyngeal Swab     Status: None   Collection Time: 08/28/20 10:37 PM   Specimen: Nasopharyngeal Swab; Nasopharyngeal(NP) swabs in vial transport medium  Result Value Ref Range Status   SARS Coronavirus 2 by RT PCR NEGATIVE NEGATIVE Final    Comment: (NOTE) SARS-CoV-2 target nucleic acids are NOT DETECTED.  The SARS-CoV-2 RNA is generally detectable in upper respiratory specimens during the acute phase of infection. The lowest concentration of SARS-CoV-2 viral copies this assay can detect is 138 copies/mL. A negative result does not preclude SARS-Cov-2 infection and should not be used as the sole basis for treatment or other patient management  decisions. A negative result may occur with  improper specimen collection/handling, submission of specimen other than nasopharyngeal swab, presence of viral mutation(s) within the areas targeted by this assay, and inadequate number of viral copies(<138 copies/mL). A negative result must be combined with clinical observations, patient history, and epidemiological information. The expected result is Negative.  Fact Sheet for Patients:  EntrepreneurPulse.com.au  Fact Sheet for Healthcare Providers:  IncredibleEmployment.be  This test is no t yet approved or cleared by the [place] FDA and  has been authorized for detection and/or diagnosis of SARS-CoV-2 by FDA under an Emergency Use Authorization (EUA). This EUA will remain  in effect (meaning this test can be used) for the duration of the COVID-19 declaration under Section 564(b)(1) of the Act, 21 U.S.C.section 360bbb-3(b)(1), unless the authorization is terminated  or revoked sooner.       Influenza A by PCR NEGATIVE NEGATIVE Final   Influenza B by PCR NEGATIVE NEGATIVE Final    Comment: (NOTE) The Xpert Xpress SARS-CoV-2/FLU/RSV plus assay is intended as an aid in the diagnosis of influenza from Nasopharyngeal swab specimens and should not be used as a sole basis for treatment. Nasal washings and aspirates are unacceptable for Xpert Xpress SARS-CoV-2/FLU/RSV testing.  Fact Sheet for Patients: EntrepreneurPulse.com.au  Fact Sheet for Healthcare Providers: IncredibleEmployment.be  This test is not yet approved or cleared by the [place] FDA and has been authorized for detection and/or diagnosis of SARS-CoV-2 by FDA under an Emergency Use Authorization (EUA). This EUA will remain in effect (meaning this test can be used) for the duration of the  COVID-19 declaration under Section 564(b)(1) of the Act, 21 U.S.C. section 360bbb-3(b)(1), unless the  authorization is terminated or revoked.  Performed at [facility], [place] [address], [place], [place] [address]   Surgical PCR screen     Status: None   Collection Time: 08/29/20 10:59 PM   Specimen: Nasal Mucosa; Nasal Swab  Result Value Ref Range Status   MRSA, PCR NEGATIVE NEGATIVE Final   Staphylococcus aureus NEGATIVE NEGATIVE Final    Comment: (NOTE) The Xpert SA Assay (FDA approved for NASAL specimens in patients 83 years of age and older), is one component of a comprehensive surveillance program. It is not intended to diagnose infection nor to guide or monitor treatment. Performed at [facility], [place] [address], [place], [place] [address]     Procedures and diagnostic studies:  No results found.             LOS: 6 days   Adelfa Lozito  [facility]   Pager on [email or web address]. If 7PM-7AM, please contact night-coverage at [email or web address]     09/03/2020, 10:31 AM

## 2020-09-04 LAB — RESP PANEL BY RT-PCR (FLU A&B, COVID) ARPGX2
Influenza A by PCR: NEGATIVE
Influenza B by PCR: NEGATIVE
SARS Coronavirus 2 by RT PCR: NEGATIVE

## 2020-09-04 MED ORDER — SODIUM CHLORIDE 0.9 % IV SOLN
200.0000 mg | Freq: Once | INTRAVENOUS | Status: AC
  Administered 2020-09-04: 200 mg via INTRAVENOUS
  Filled 2020-09-04: qty 10

## 2020-09-04 NOTE — Progress Notes (Signed)
Physical Therapy Treatment Patient Details Name: Sheri Hensley MRN: [id number] DOB: 1919/05/28 Today's Date: 09/04/2020    History of Present Illness Pt is a 87 y/o female admitted after a fall in which she sustained a R femoral neck fracture. She is now s/p R hip hemiarthroplasty. PMH including but not limited to a-fib.    PT Comments    Pt. Demos improved tolerance to activity and is able to amb inc distance today with minimal assist.  No longer req's chair follow for short room-length distance.  Would only need chair follow for distance in excess of 50 ft.  Continues to have greatest difficulty with sit > stand and continues to require AA with therex.  Pt. Would benefit from continued skilled PT in acute care to improve IND in these areas.   Follow Up Recommendations  SNF     Equipment Recommendations  Rolling walker with 5" wheels    Recommendations for Other Services       Precautions / Restrictions Precautions Precautions: Fall Precaution Comments: HOH Restrictions Weight Bearing Restrictions: Yes RLE Weight Bearing: Weight bearing as tolerated    Mobility  Bed Mobility   Bed Mobility: Supine to Sit     Supine to sit: Min assist     General bed mobility comments: Req's min A for R LE negotiation and trunk support.  Demos difficulty with scooting to EOB req'ing use of chuck pad. Patient Response: Cooperative  Transfers Overall transfer level: Needs assistance Equipment used: Rolling walker (2 wheeled) Transfers: Sit to/from [facility] Sit to Stand: Mod assist Stand pivot transfers: Min assist       General transfer comment: Pt. urgently requests to use BR.  Wants to go in bed pan but is agreeable to try BSC.  Pt. performs sit > stand with mod A to complete transfer.  VCs for hand placement during transfer.  Demos difficulty following instructions, wants to reach for RW too soon.  Fair standing balance.  Able to take small steps over to  [facility] with min A.  VCs to reach back for commode prior to sitting.  Ambulation/Gait Ambulation/Gait assistance: Min assist Gait Distance (Feet): 60 Feet Assistive device: Rolling walker (2 wheeled) Gait Pattern/deviations: Step-to pattern;Decreased stance time - right     General Gait Details: Pt. amb ~40 ft prior to req'ing seated rest break.  After seated rest break, able to amb another 10-15 ft prior to sitting again due to fatigue.  C/o UE discomfort from using the RW.  Educated to limit pushing through UEs onto RW if pain in R LE is under control.  Demos partial understanding.  Pt. often attempts to lift RW while amb, needs VCs to slide RW across floor instead of lifting it.   Stairs             Wheelchair Mobility    Modified Rankin (Stroke Patients Only)       Balance Overall balance assessment: Needs assistance Sitting-balance support: Bilateral upper extremity supported Sitting balance-Leahy Scale: Fair     Standing balance support: Bilateral upper extremity supported Standing balance-Leahy Scale: Poor                              Cognition Arousal/Alertness: Awake/alert Behavior During Therapy: WFL for tasks assessed/performed Overall Cognitive Status: Within Functional Limits for tasks assessed Area of Impairment: Memory  Memory: Decreased short-term memory Following Commands: Follows one step commands consistently Safety/Judgement: Decreased awareness of safety     General Comments: Pt. is forgetful, needs reminders for safety with hand/foot placement during transfers and AD negotiation.      Exercises Total Joint Exercises Ankle Circles/Pumps: AROM;Both;20 reps Hip ABduction/ADduction: Right;20 reps;AAROM Straight Leg Raises: AROM;Right;20 reps;AAROM Knee Flexion: Right;20 reps;AAROM Other Exercises Other Exercises: Exercises are all completed 1 x 10, 30 sec rest break, and then one more set of 10.     General Comments        Pertinent Vitals/Pain Pain Assessment: Faces Faces Pain Scale: Hurts little more Pain Location: R hip/thigh.  Mildly confused, unable to state a number. Pain Descriptors / Indicators: Aching;Discomfort Pain Intervention(s): Limited activity within patient's tolerance;Repositioned    Home Living                      Prior Function            PT Goals (current goals can now be found in the care plan section) Progress towards PT goals: Progressing toward goals    Frequency    Min 5X/week      PT Plan Current plan remains appropriate    Co-evaluation              AM-PAC PT "6 Clicks" Mobility   Outcome Measure  Help needed turning from your back to your side while in a flat bed without using bedrails?: A Little Help needed moving from lying on your back to sitting on the side of a flat bed without using bedrails?: A Little Help needed moving to and from a bed to a chair (including a wheelchair)?: A Little Help needed standing up from a chair using your arms (e.g., wheelchair or bedside chair)?: A Little Help needed to walk in hospital room?: A Little Help needed climbing 3-5 steps with a railing? : A Lot 6 Click Score: 17    End of Session Equipment Utilized During Treatment: Gait belt Activity Tolerance: Patient tolerated treatment well Patient left: in chair;with call bell/phone within reach;with chair alarm set;with family/visitor present   PT Visit Diagnosis: Unsteadiness on feet (R26.81);Other abnormalities of gait and mobility (R26.89);History of falling (Z91.81) Pain - Right/Left: Right Pain - part of body: Hip     Time: [id number] PT Time Calculation (min) (ACUTE ONLY): 32 min  Charges:  $Gait Training: 8-22 mins $Therapeutic Exercise: 8-22 mins                     Khadim Lundberg A. Jaquayla Hege, PT, DPT Acute Rehabilitation Services Office: [place] 09/04/2020, 11:45 AM

## 2020-09-04 NOTE — Progress Notes (Signed)
Patient slept so well overnight without any issue. She is awake around 0630 and denies any discomfort. Will continue to assess.

## 2020-09-04 NOTE — Progress Notes (Addendum)
Progress Note    Sheri Hensley  [id number] DOB: [birth year]  DOA: 08/28/2020 PCP: Josetta Huddle, MD      Brief Narrative:    Medical records reviewed and are as summarized below:  Sheri Hensley is a 87 y.o. female with medical history significant for paroxysmal atrial fibrillation on Plavix, CKD stage IIIb, hypertension, anxiety, depression, hyperlipidemia, GERD, who presented to the hospital after a fall at home.  She was found to have closed displaced right hip fracture.  She underwent right hip hemiarthroplasty on 08/30/2020.  Hospital course was complicated by acute confusional state that was attributed to delirium probably from anesthetics versus opioid analgesics.  She also had hyponatremia that improved with fluids.  She had hypokalemia and hypophosphatemia that were repleted.  She developed acute blood loss anemia but she did not require blood transfusion.  She was evaluated by PT and OT who recommended further rehabilitation at a skilled nursing facility.    Assessment/Plan:   Principal Problem:   Closed right hip fracture (HCC) Active Problems:   AKI (acute kidney injury) ([facility])   AF (paroxysmal atrial fibrillation) (HCC)   Hyponatremia    Body mass index is 23.56 kg/m.   Closed right hip fracture: S/p right hip hemiarthroplasty on 08/30/2020.  Continue analgesics as needed for pain.  Acute postoperative blood loss anemia, iron deficiency anemia: Hemoglobin is down to 8.4. Iron level is 17, percentage iron saturation is 7 and ferritin is 154.  Give 1 dose of IV iron sucrose today.  Monitor CBC.  Delirium: Improved and she is back to baseline.  Paroxysmal atrial fibrillation: Continue amiodarone, aspirin and Plavix.  Thrombocytopenia: Unknown whether this is acute or chronic.  CKD stage IIIb: Creatinine is stable.  No AKI.  Hypokalemia, hyponatremia, hypophosphatemia: Resolved  Other comorbidities include anxiety, depression,  hypertension, hyperlipidemia, GERD.  Awaiting placement to SNF.  Plan to discharge to SNF tomorrow. Screening COVID test has been ordered.  Diet Order             Diet regular Room service appropriate? Yes; Fluid consistency: Thin  Diet effective now                      Consultants: Orthopedic surgeon  Procedures: S/p right hip hemiarthroplasty on 08/30/2020    Medications:    amiodarone  100 mg Oral QODAY   aspirin  81 mg Oral BID WC   atorvastatin  10 mg Oral Daily   brimonidine  1 drop Both Eyes BID   Chlorhexidine Gluconate Cloth  6 each Topical Daily   clopidogrel  75 mg Oral Daily   isosorbide mononitrate  10 mg Oral BID   latanoprost  1 drop Both Eyes QHS   pantoprazole  40 mg Oral q morning   sertraline  50 mg Oral Daily   sucralfate  1 g Oral BID   vitamin B-12  1,000 mcg Oral Daily   Continuous Infusions:  tranexamic acid       Anti-infectives (From admission, onward)    Start     Dose/Rate Route Frequency Ordered Stop   08/30/20 2100  ceFAZolin (ANCEF) IVPB 2g/100 mL premix        2 g 200 mL/hr over 30 Minutes Intravenous Every 12 hours 08/30/20 1839 08/30/20 2219   08/30/20 1900  ceFAZolin (ANCEF) IVPB 2g/100 mL premix  Status:  Discontinued        2 g 200 mL/hr over 30 Minutes Intravenous Every 6  hours 08/30/20 1807 08/30/20 1838   08/30/20 0600  ceFAZolin (ANCEF) IVPB 2g/100 mL premix        2 g 200 mL/hr over 30 Minutes Intravenous On call to O.R. 08/29/20 2008 08/30/20 0920              Family Communication/Anticipated D/C date and plan/Code Status   DVT prophylaxis: SCDs Start: 08/30/20 1808     Code Status: Full Code  Family Communication: Plan discussed with Jocelyn Lamer, daughter, at the bedside Disposition Plan:    Status is: Inpatient  Remains inpatient appropriate because:Unsafe d/c plan  Dispo: The patient is from: Home              Anticipated d/c is to: SNF              Patient currently is medically stable to  d/c.   Difficult to place patient Yes           Subjective:   Interval events noted.  Right hip pain is better today.  Objective:    Vitals:   09/03/20 1417 09/03/20 2150 09/04/20 0949 09/04/20 1409  BP: (!) 122/59 (!) 166/68 (!) 121/56 (!) 125/51  Pulse: 67 71 70 69  Resp: '17 18  17  '$ Temp: 97.6 F (36.4 C) 98.6 F (37 C) 98.2 F (36.8 C) 98.6 F (37 C)  TempSrc: Oral Oral Oral Oral  SpO2: 100% 98% 98% 98%  Weight:      Height:       No data found.  No intake or output data in the 24 hours ending 09/04/20 1651  Filed Weights   08/28/20 2203  Weight: 60.3 kg    Exam:  GEN: NAD SKIN: Warm and dry EYES: No pallor or icterus ENT: MMM CV: RRR PULM: CTA B ABD: soft, ND, NT, +BS CNS: AAO x 3, non focal EXT: Mild right hip tenderness.  Dressing on right hip surgical wound is clean, dry and intact.         Data Reviewed:   I have personally reviewed following labs and imaging studies:  Labs: Labs show the following:   Basic Metabolic Panel: Recent Labs  Lab 08/30/20 0309 08/31/20 0121 09/01/20 1026 09/02/20 0721 09/03/20 0237  NA 128* 128* 131* 133* 136  K 3.6 4.1 3.2* 3.7 4.8  CL 93* 97* 98 100 105  CO2 26 19* '25 27 26  '$ GLUCOSE 120* 105* 138* 107* 113*  BUN 15 22 28* 22 21  CREATININE 1.17* 1.27* 1.32* 1.06* 1.11*  CALCIUM 9.3 8.5* 8.3* 8.4* 8.6*  MG 1.7 1.8 1.9 1.9 1.9  PHOS 3.4 2.6 2.3* 2.8 3.4   GFR Estimated Creatinine Clearance: 23.4 mL/min (A) (by C-G formula based on SCr of 1.11 mg/dL (H)). Liver Function Tests: Recent Labs  Lab 08/30/20 0309 08/31/20 0121 09/01/20 1026 09/02/20 0721 09/03/20 0237  AST 34 54* 43* 33 28  ALT '21 19 14 15 17  '$ ALKPHOS 75 57 54 55 53  BILITOT 1.9* 1.4* 1.2 1.4* 1.3*  PROT 6.4* 5.6* 5.4* 5.4* 5.2*  ALBUMIN 3.7 3.1* 3.0* 2.9* 2.7*   No results for input(s): LIPASE, AMYLASE in the last 168 hours. No results for input(s): AMMONIA in the last 168 hours. Coagulation profile Recent Labs   Lab 08/28/20 2209  INR 1.1    CBC: Recent Labs  Lab 08/30/20 0309 08/31/20 0121 09/01/20 1026 09/02/20 0721 09/03/20 0237  WBC 10.3 10.2 7.0 6.6 5.1  NEUTROABS 8.8* 9.0* 5.8  --  3.7  HGB 11.3* 9.9* 8.7* 9.3* 8.4*  HCT 34.2* 29.3* 25.7* 27.6* 25.5*  MCV 87.5 88.0 87.4 87.6 88.9  PLT 127* 121* 114* 119* 120*   Cardiac Enzymes: No results for input(s): CKTOTAL, CKMB, CKMBINDEX, TROPONINI in the last 168 hours. BNP (last 3 results) No results for input(s): PROBNP in the last 8760 hours. CBG: No results for input(s): GLUCAP in the last 168 hours. D-Dimer: No results for input(s): DDIMER in the last 72 hours. Hgb A1c: No results for input(s): HGBA1C in the last 72 hours. Lipid Profile: No results for input(s): CHOL, HDL, LDLCALC, TRIG, CHOLHDL, LDLDIRECT in the last 72 hours. Thyroid function studies: Recent Labs    09/02/20 0721  TSH 4.046   Anemia work up: Recent Labs    09/02/20 0721  VITAMINB12 1,215*  FOLATE 19.6  FERRITIN 154  TIBC 232*  IRON 17*  RETICCTPCT 1.8   Sepsis Labs: Recent Labs  Lab 08/28/20 2209 08/30/20 0309 08/31/20 0121 09/01/20 1026 09/02/20 0721 09/03/20 0237  WBC 7.7   < > 10.2 7.0 6.6 5.1  LATICACIDVEN 2.2*  --   --   --   --   --    < > = values in this interval not displayed.    Microbiology Recent Results (from the past 240 hour(s))  Resp Panel by RT-PCR (Flu A&B, Covid) Nasopharyngeal Swab     Status: None   Collection Time: 08/28/20 10:37 PM   Specimen: Nasopharyngeal Swab; Nasopharyngeal(NP) swabs in vial transport medium  Result Value Ref Range Status   SARS Coronavirus 2 by RT PCR NEGATIVE NEGATIVE Final    Comment: (NOTE) SARS-CoV-2 target nucleic acids are NOT DETECTED.  The SARS-CoV-2 RNA is generally detectable in upper respiratory specimens during the acute phase of infection. The lowest concentration of SARS-CoV-2 viral copies this assay can detect is 138 copies/mL. A negative result does not preclude  SARS-Cov-2 infection and should not be used as the sole basis for treatment or other patient management decisions. A negative result may occur with  improper specimen collection/handling, submission of specimen other than nasopharyngeal swab, presence of viral mutation(s) within the areas targeted by this assay, and inadequate number of viral copies(<138 copies/mL). A negative result must be combined with clinical observations, patient history, and epidemiological information. The expected result is Negative.  Fact Sheet for Patients:  EntrepreneurPulse.com.au  Fact Sheet for Healthcare Providers:  IncredibleEmployment.be  This test is no t yet approved or cleared by the [place] FDA and  has been authorized for detection and/or diagnosis of SARS-CoV-2 by FDA under an Emergency Use Authorization (EUA). This EUA will remain  in effect (meaning this test can be used) for the duration of the COVID-19 declaration under Section 564(b)(1) of the Act, 21 U.S.C.section 360bbb-3(b)(1), unless the authorization is terminated  or revoked sooner.       Influenza A by PCR NEGATIVE NEGATIVE Final   Influenza B by PCR NEGATIVE NEGATIVE Final    Comment: (NOTE) The Xpert Xpress SARS-CoV-2/FLU/RSV plus assay is intended as an aid in the diagnosis of influenza from Nasopharyngeal swab specimens and should not be used as a sole basis for treatment. Nasal washings and aspirates are unacceptable for Xpert Xpress SARS-CoV-2/FLU/RSV testing.  Fact Sheet for Patients: EntrepreneurPulse.com.au  Fact Sheet for Healthcare Providers: IncredibleEmployment.be  This test is not yet approved or cleared by the [place] FDA and has been authorized for detection and/or diagnosis of SARS-CoV-2 by FDA under an Emergency Use Authorization (EUA). This EUA will  remain in effect (meaning this test can be used) for the duration of  the COVID-19 declaration under Section 564(b)(1) of the Act, 21 U.S.C. section 360bbb-3(b)(1), unless the authorization is terminated or revoked.  Performed at [facility], [place] [address] [place], [place] [address]   Surgical PCR screen     Status: None   Collection Time: 08/29/20 10:59 PM   Specimen: Nasal Mucosa; Nasal Swab  Result Value Ref Range Status   MRSA, PCR NEGATIVE NEGATIVE Final   Staphylococcus aureus NEGATIVE NEGATIVE Final    Comment: (NOTE) The Xpert SA Assay (FDA approved for NASAL specimens in patients 4 years of age and older), is one component of a comprehensive surveillance program. It is not intended to diagnose infection nor to guide or monitor treatment. Performed at [facility], [place] [address], [place], [place] [address]     Procedures and diagnostic studies:  No results found.             LOS: 7 days   Ketsia Linebaugh  [facility]   Pager on [email or web address]. If 7PM-7AM, please contact night-coverage at [email or web address]     09/04/2020, 4:51 PM

## 2020-09-04 NOTE — TOC Progression Note (Signed)
Transition of Care [facility]) - Progression Note    Patient Details  Name: Sheri Hensley MRN: [id number] Date of Birth: 07-01-1919  Transition of Care [facility]) CM/SW Contact  Milinda Antis, [place] Phone Number: 09/04/2020, 3:57 PM  Clinical Narrative:    CSW was contacted by Claiborne Billings at [facility].  The facility has received insurance authorization and can accept the patient tomorrow.  CSW notified the attending and RN and requested that a rapid COVID test be ordered today so that the patient will be ready to d/c tomorrow.          Expected Discharge Plan and Services                                                 Social Determinants of Health (SDOH) Interventions    Readmission Risk Interventions No flowsheet data found.

## 2020-09-05 MED ORDER — AMLODIPINE BESYLATE 2.5 MG PO TABS
2.5000 mg | ORAL_TABLET | Freq: Every day | ORAL | Status: DC
  Administered 2020-09-05: 2.5 mg via ORAL
  Filled 2020-09-05: qty 1

## 2020-09-05 MED ORDER — LOSARTAN POTASSIUM 50 MG PO TABS
50.0000 mg | ORAL_TABLET | Freq: Every day | ORAL | Status: DC
  Administered 2020-09-05: 50 mg via ORAL
  Filled 2020-09-05: qty 1

## 2020-09-05 MED ORDER — HYDRALAZINE HCL 20 MG/ML IJ SOLN
5.0000 mg | Freq: Once | INTRAMUSCULAR | Status: AC
  Administered 2020-09-05: 5 mg via INTRAVENOUS
  Filled 2020-09-05: qty 1

## 2020-09-05 MED ORDER — LOSARTAN POTASSIUM-HCTZ 50-12.5 MG PO TABS: 1.0000 | ORAL_TABLET | Freq: Every day | ORAL | Status: DC

## 2020-09-05 MED ORDER — HYDRALAZINE HCL 10 MG PO TABS
5.0000 mg | ORAL_TABLET | Freq: Once | ORAL | Status: DC
  Filled 2020-09-05: qty 1

## 2020-09-05 MED ORDER — HYDROCHLOROTHIAZIDE 12.5 MG PO CAPS
12.5000 mg | ORAL_CAPSULE | Freq: Every day | ORAL | Status: DC
  Administered 2020-09-05: 12.5 mg via ORAL
  Filled 2020-09-05: qty 1

## 2020-09-05 NOTE — Discharge Summary (Signed)
Physician Discharge Summary  Sheri Hensley [id number] DOB: September 21, 1919 DOA: 08/28/2020  PCP: Josetta Huddle, MD  Admit date: 08/28/2020 Discharge date: 09/05/2020  Discharge disposition: SNF   Recommendations for Outpatient Follow-Up:   Follow-up with Dr. Lyla Glassing, orthopedic surgeon, in 2 weeks. Follow-up with physician at the nursing home within 3 days of discharge.   Discharge Diagnosis:   Principal Problem:   Closed right hip fracture (HCC) Active Problems:   AKI (acute kidney injury) ([place])   AF (paroxysmal atrial fibrillation) (HCC)   Hyponatremia    Discharge Condition: Stable.  Diet recommendation:  Diet Order             Diet - low sodium heart healthy           Diet regular Room service appropriate? Yes; Fluid consistency: Thin  Diet effective now                     Code Status: Full Code     Hospital Course:   Sheri Hensley is a 87 y.o. female with medical history significant for paroxysmal atrial fibrillation on Plavix, CKD stage IIIb, hypertension, anxiety, depression, hyperlipidemia, GERD, who presented to the hospital after a fall at home.  She was found to have closed displaced right hip fracture.   She underwent right hip hemiarthroplasty on 08/30/2020.  Hospital course was complicated by acute confusional state that was attributed to delirium probably from anesthetics versus opioid analgesics.  She also had hyponatremia that improved with fluids.  She had hypokalemia and hypophosphatemia that were repleted.  She developed acute blood loss anemia but she did not require blood transfusion.   She was evaluated by PT and OT who recommended further rehabilitation at a skilled nursing facility.      Medical Consultants:   Orthopedic surgeon   Discharge Exam:    Vitals:   09/05/20 0000 09/05/20 0002 09/05/20 0200 09/05/20 0730  BP: (!) 185/81 (!) 185/80 (!) 152/64 (!) 180/75  Pulse: 69 79 81 83  Resp: '17 17 16 16   '$ Temp: 97.8 F (36.6 C) 97.8 F (36.6 C) 98 F (36.7 C) 98.4 F (36.9 C)  TempSrc: Oral Oral Oral Oral  SpO2:   100% 97%  Weight:      Height:         GEN: NAD SKIN: Warm and dry EYES: No pallor or icterus ENT: MMM CV: RRR PULM: CTA B ABD: soft, ND, NT, +BS CNS: AAO x 2 (person and place), non focal EXT: Mild right hip tenderness   The results of significant diagnostics from this hospitalization (including imaging, microbiology, ancillary and laboratory) are listed below for reference.     Procedures and Diagnostic Studies:   CT HEAD WO CONTRAST (5MM)  Result Date: 08/28/2020 CLINICAL DATA:  Fall EXAM: CT HEAD WITHOUT CONTRAST CT CERVICAL SPINE WITHOUT CONTRAST TECHNIQUE: Multidetector CT imaging of the head and cervical spine was performed following the standard protocol without intravenous contrast. Multiplanar CT image reconstructions of the cervical spine were also generated. COMPARISON:  CT head, maxillofacial and C-spine 07/01/2017 MR head 12/23/2015 FINDINGS: CT HEAD FINDINGS Brain: No evidence of acute infarction, hemorrhage, hydrocephalus, extra-axial collection, visible mass lesion or mass effect. Symmetric prominence of the ventricles, cisterns and sulci compatible with parenchymal volume loss. Patchy areas of white matter hypoattenuation are most compatible with chronic microvascular angiopathy. Chronic dural calcifications. Midline structures are unremarkable. Vascular: Atherosclerotic calcification of the carotid siphons and intradural vertebral arteries. No hyperdense  vessel. Skull: Right parietal scalp swelling and small laceration with thin crescentic scalp hematoma measuring up to 7 mm in maximal thickness. No subjacent calvarial fracture, sutural diastasis or other acute or traumatic osseous injury. No suspicious osseous lesions. Additional partly calcified subcutaneous nodule of the left high parietal scalp (4/69) most compatible with a benign trichilemmal cyst.  Sinuses/Orbits: Paranasal sinuses and mastoid air cells are predominantly clear. Orbital structures are unremarkable aside from prior lens extractions. Other: Bilateral TMJ arthrosis. CT CERVICAL SPINE FINDINGS Alignment: Stabilization collar in place at the time of exam. Straightening of the normal cervical lordosis. Focal reversal at the C5 level with retrolisthesis C5 on C6 of approximately 3 mm. Minimal 1 mm of anterolisthesis C3 on C4. Overall, these findings are not significantly changed from comparison prior and favored to be on a chronic degenerative basis. Stable appearance of the facets at this level. No evidence of traumatic listhesis. Bony fusion across the C2-3 articular facets bilaterally as well as on the left C4-5. No abnormally widened, perched or jumped facets. Normal alignment of the craniocervical and atlantoaxial articulations. Skull base and vertebrae: The osseous structures appear diffusely demineralized which may limit detection of small or nondisplaced fractures. No acute skull base fracture. No vertebral body fracture or height loss. No worrisome osseous lesions. Essentially complete effacement of the atlantodental interval with extensive arthrosis and additional degenerative ossicles/fragmented osteophytes at the dens, stable from prior with calcific pannus formation. Multilevel cervical spondylitic changes, further detailed below. Soft tissues and spinal canal: No pre or paravertebral fluid or swelling. No visible canal hematoma. Airways patent. No conspicuous cervical adenopathy. Cervical carotid atherosclerosis. No acute traumatic abnormality of the cervical spine. Disc levels: Multilevel intervertebral disc height loss with spondylitic endplate changes. Features most pronounced C5-6 where a combination of retrolisthesis and disc osteophyte complex with ligamentum flavum hypertrophy result in moderate to severe canal stenosis at this level. Central disc osteophyte complexes C3-4, C6-7  also resulting in some mild canal narrowing with additional effacement of the ventral thecal sac at the remaining cervical levels. Multilevel uncinate spurring and facet hypertrophic changes are present throughout the cervical spine with mild to moderate multilevel neural foraminal narrowing and more moderate to severe narrowing bilaterally C3-4, C5-6. Upper chest: Biapical areas of pleuroparenchymal scarring. Some atelectatic changes are present. Mild interlobular septal thickening is noted as well, could reflect early developing edema. Calcification of the proximal great vessels. Other: 1.6 cm hypoattenuating nodule in the right lobe thyroid gland. In the setting of significant comorbidities or limited life expectancy, no follow-up recommended (ref: J Am Coll Radiol. 2015 Feb;12(2): 143-50). IMPRESSION: 1. Right parietal scalp thickening and laceration with crescentic scalp hematoma. No calvarial fracture. 2. No acute intracranial abnormality. 3. Background of microvascular angiopathy, parenchymal volume loss and intracranial and cervical atherosclerosis. 4. No acute fracture or traumatic listhesis of the cervical spine. 5. Multilevel cervical spondylitic changes, most pronounced C5-6 where there is severe canal stenosis and moderate to severe bilateral foraminal narrowing. Additional mild canal narrowing C3-4 with moderate to severe foraminal stenosis. Additional changes as above. 6. Biapical areas of pleuroparenchymal scarring with superimposed septal thickening, could reflect developing interstitial edema in the appropriate clinical setting. Electronically Signed   By: Lovena Le M.D.   On: 08/28/2020 22:52   CT Cervical Spine Wo Contrast  Result Date: 08/28/2020 CLINICAL DATA:  Fall EXAM: CT HEAD WITHOUT CONTRAST CT CERVICAL SPINE WITHOUT CONTRAST TECHNIQUE: Multidetector CT imaging of the head and cervical spine was performed following the standard protocol  without intravenous contrast. Multiplanar CT  image reconstructions of the cervical spine were also generated. COMPARISON:  CT head, maxillofacial and C-spine 07/01/2017 MR head 12/23/2015 FINDINGS: CT HEAD FINDINGS Brain: No evidence of acute infarction, hemorrhage, hydrocephalus, extra-axial collection, visible mass lesion or mass effect. Symmetric prominence of the ventricles, cisterns and sulci compatible with parenchymal volume loss. Patchy areas of white matter hypoattenuation are most compatible with chronic microvascular angiopathy. Chronic dural calcifications. Midline structures are unremarkable. Vascular: Atherosclerotic calcification of the carotid siphons and intradural vertebral arteries. No hyperdense vessel. Skull: Right parietal scalp swelling and small laceration with thin crescentic scalp hematoma measuring up to 7 mm in maximal thickness. No subjacent calvarial fracture, sutural diastasis or other acute or traumatic osseous injury. No suspicious osseous lesions. Additional partly calcified subcutaneous nodule of the left high parietal scalp (4/69) most compatible with a benign trichilemmal cyst. Sinuses/Orbits: Paranasal sinuses and mastoid air cells are predominantly clear. Orbital structures are unremarkable aside from prior lens extractions. Other: Bilateral TMJ arthrosis. CT CERVICAL SPINE FINDINGS Alignment: Stabilization collar in place at the time of exam. Straightening of the normal cervical lordosis. Focal reversal at the C5 level with retrolisthesis C5 on C6 of approximately 3 mm. Minimal 1 mm of anterolisthesis C3 on C4. Overall, these findings are not significantly changed from comparison prior and favored to be on a chronic degenerative basis. Stable appearance of the facets at this level. No evidence of traumatic listhesis. Bony fusion across the C2-3 articular facets bilaterally as well as on the left C4-5. No abnormally widened, perched or jumped facets. Normal alignment of the craniocervical and atlantoaxial articulations.  Skull base and vertebrae: The osseous structures appear diffusely demineralized which may limit detection of small or nondisplaced fractures. No acute skull base fracture. No vertebral body fracture or height loss. No worrisome osseous lesions. Essentially complete effacement of the atlantodental interval with extensive arthrosis and additional degenerative ossicles/fragmented osteophytes at the dens, stable from prior with calcific pannus formation. Multilevel cervical spondylitic changes, further detailed below. Soft tissues and spinal canal: No pre or paravertebral fluid or swelling. No visible canal hematoma. Airways patent. No conspicuous cervical adenopathy. Cervical carotid atherosclerosis. No acute traumatic abnormality of the cervical spine. Disc levels: Multilevel intervertebral disc height loss with spondylitic endplate changes. Features most pronounced C5-6 where a combination of retrolisthesis and disc osteophyte complex with ligamentum flavum hypertrophy result in moderate to severe canal stenosis at this level. Central disc osteophyte complexes C3-4, C6-7 also resulting in some mild canal narrowing with additional effacement of the ventral thecal sac at the remaining cervical levels. Multilevel uncinate spurring and facet hypertrophic changes are present throughout the cervical spine with mild to moderate multilevel neural foraminal narrowing and more moderate to severe narrowing bilaterally C3-4, C5-6. Upper chest: Biapical areas of pleuroparenchymal scarring. Some atelectatic changes are present. Mild interlobular septal thickening is noted as well, could reflect early developing edema. Calcification of the proximal great vessels. Other: 1.6 cm hypoattenuating nodule in the right lobe thyroid gland. In the setting of significant comorbidities or limited life expectancy, no follow-up recommended (ref: J Am Coll Radiol. 2015 Feb;12(2): 143-50). IMPRESSION: 1. Right parietal scalp thickening and  laceration with crescentic scalp hematoma. No calvarial fracture. 2. No acute intracranial abnormality. 3. Background of microvascular angiopathy, parenchymal volume loss and intracranial and cervical atherosclerosis. 4. No acute fracture or traumatic listhesis of the cervical spine. 5. Multilevel cervical spondylitic changes, most pronounced C5-6 where there is severe canal stenosis and moderate to severe bilateral  foraminal narrowing. Additional mild canal narrowing C3-4 with moderate to severe foraminal stenosis. Additional changes as above. 6. Biapical areas of pleuroparenchymal scarring with superimposed septal thickening, could reflect developing interstitial edema in the appropriate clinical setting. Electronically Signed   By: Lovena Le M.D.   On: 08/28/2020 22:52   DG Chest Port 1 View  Result Date: 08/28/2020 CLINICAL DATA:  Fall EXAM: PORTABLE CHEST 1 VIEW COMPARISON:  07/15/2020 FINDINGS: Mild cardiomegaly. No confluent airspace disease or effusion. Biapical scarring. No pneumothorax. No acute bony abnormality. IMPRESSION: No active disease. Electronically Signed   By: Rolm Baptise M.D.   On: 08/28/2020 22:20   DG Knee Right Port  Result Date: 08/29/2020 CLINICAL DATA:  Right femoral neck fracture. EXAM: PORTABLE RIGHT KNEE - 1-2 VIEW COMPARISON:  None. FINDINGS: No evidence of fracture, dislocation, or joint effusion. Severe degenerative changes seen involving the lateral joint space. Mild degenerative changes seen involving the medial joint space. Moderate degenerative changes seen involving the patellofemoral space with osteophyte formation. Vascular calcifications are noted. IMPRESSION: Tricompartmental degenerative joint disease. No acute abnormality seen. Electronically Signed   By: Marijo Conception M.D.   On: 08/29/2020 11:35   DG Hip Unilat W or Wo Pelvis 2-3 Views Right  Result Date: 08/28/2020 CLINICAL DATA:  Fall EXAM: DG HIP (WITH OR WITHOUT PELVIS) 2-3V RIGHT COMPARISON:  None.  FINDINGS: There is a right femoral neck fracture with varus angulation. No subluxation or dislocation. Mild degenerative changes in the hips. IMPRESSION: Right femoral neck fracture with varus angulation. Electronically Signed   By: Rolm Baptise M.D.   On: 08/28/2020 22:20     Labs:   Basic Metabolic Panel: Recent Labs  Lab 08/30/20 0309 08/31/20 0121 09/01/20 1026 09/02/20 0721 09/03/20 0237  NA 128* 128* 131* 133* 136  K 3.6 4.1 3.2* 3.7 4.8  CL 93* 97* 98 100 105  CO2 26 19* '25 27 26  '$ GLUCOSE 120* 105* 138* 107* 113*  BUN 15 22 28* 22 21  CREATININE 1.17* 1.27* 1.32* 1.06* 1.11*  CALCIUM 9.3 8.5* 8.3* 8.4* 8.6*  MG 1.7 1.8 1.9 1.9 1.9  PHOS 3.4 2.6 2.3* 2.8 3.4   GFR Estimated Creatinine Clearance: 23.4 mL/min (A) (by C-G formula based on SCr of 1.11 mg/dL (H)). Liver Function Tests: Recent Labs  Lab 08/30/20 0309 08/31/20 0121 09/01/20 1026 09/02/20 0721 09/03/20 0237  AST 34 54* 43* 33 28  ALT '21 19 14 15 17  '$ ALKPHOS 75 57 54 55 53  BILITOT 1.9* 1.4* 1.2 1.4* 1.3*  PROT 6.4* 5.6* 5.4* 5.4* 5.2*  ALBUMIN 3.7 3.1* 3.0* 2.9* 2.7*   No results for input(s): LIPASE, AMYLASE in the last 168 hours. No results for input(s): AMMONIA in the last 168 hours. Coagulation profile No results for input(s): INR, PROTIME in the last 168 hours.  CBC: Recent Labs  Lab 08/30/20 0309 08/31/20 0121 09/01/20 1026 09/02/20 0721 09/03/20 0237  WBC 10.3 10.2 7.0 6.6 5.1  NEUTROABS 8.8* 9.0* 5.8  --  3.7  HGB 11.3* 9.9* 8.7* 9.3* 8.4*  HCT 34.2* 29.3* 25.7* 27.6* 25.5*  MCV 87.5 88.0 87.4 87.6 88.9  PLT 127* 121* 114* 119* 120*   Cardiac Enzymes: No results for input(s): CKTOTAL, CKMB, CKMBINDEX, TROPONINI in the last 168 hours. BNP: Invalid input(s): POCBNP CBG: No results for input(s): GLUCAP in the last 168 hours. D-Dimer No results for input(s): DDIMER in the last 72 hours. Hgb A1c No results for input(s): HGBA1C in the last 72  hours. Lipid Profile No results for  input(s): CHOL, HDL, LDLCALC, TRIG, CHOLHDL, LDLDIRECT in the last 72 hours. Thyroid function studies No results for input(s): TSH, T4TOTAL, T3FREE, THYROIDAB in the last 72 hours.  Invalid input(s): FREET3 Anemia work up No results for input(s): VITAMINB12, FOLATE, FERRITIN, TIBC, IRON, RETICCTPCT in the last 72 hours. Microbiology Recent Results (from the past 240 hour(s))  Resp Panel by RT-PCR (Flu A&B, Covid) Nasopharyngeal Swab     Status: None   Collection Time: 08/28/20 10:37 PM   Specimen: Nasopharyngeal Swab; Nasopharyngeal(NP) swabs in vial transport medium  Result Value Ref Range Status   SARS Coronavirus 2 by RT PCR NEGATIVE NEGATIVE Final    Comment: (NOTE) SARS-CoV-2 target nucleic acids are NOT DETECTED.  The SARS-CoV-2 RNA is generally detectable in upper respiratory specimens during the acute phase of infection. The lowest concentration of SARS-CoV-2 viral copies this assay can detect is 138 copies/mL. A negative result does not preclude SARS-Cov-2 infection and should not be used as the sole basis for treatment or other patient management decisions. A negative result may occur with  improper specimen collection/handling, submission of specimen other than nasopharyngeal swab, presence of viral mutation(s) within the areas targeted by this assay, and inadequate number of viral copies(<138 copies/mL). A negative result must be combined with clinical observations, patient history, and epidemiological information. The expected result is Negative.  Fact Sheet for Patients:  EntrepreneurPulse.com.au  Fact Sheet for Healthcare Providers:  IncredibleEmployment.be  This test is no t yet approved or cleared by the [place] FDA and  has been authorized for detection and/or diagnosis of SARS-CoV-2 by FDA under an Emergency Use Authorization (EUA). This EUA will remain  in effect (meaning this test can be used) for the duration of  the COVID-19 declaration under Section 564(b)(1) of the Act, 21 U.S.C.section 360bbb-3(b)(1), unless the authorization is terminated  or revoked sooner.       Influenza A by PCR NEGATIVE NEGATIVE Final   Influenza B by PCR NEGATIVE NEGATIVE Final    Comment: (NOTE) The Xpert Xpress SARS-CoV-2/FLU/RSV plus assay is intended as an aid in the diagnosis of influenza from Nasopharyngeal swab specimens and should not be used as a sole basis for treatment. Nasal washings and aspirates are unacceptable for Xpert Xpress SARS-CoV-2/FLU/RSV testing.  Fact Sheet for Patients: EntrepreneurPulse.com.au  Fact Sheet for Healthcare Providers: IncredibleEmployment.be  This test is not yet approved or cleared by the [place] FDA and has been authorized for detection and/or diagnosis of SARS-CoV-2 by FDA under an Emergency Use Authorization (EUA). This EUA will remain in effect (meaning this test can be used) for the duration of the COVID-19 declaration under Section 564(b)(1) of the Act, 21 U.S.C. section 360bbb-3(b)(1), unless the authorization is terminated or revoked.  Performed at [facility], [place] [address], [place], [place] [address]   Surgical PCR screen     Status: None   Collection Time: 08/29/20 10:59 PM   Specimen: Nasal Mucosa; Nasal Swab  Result Value Ref Range Status   MRSA, PCR NEGATIVE NEGATIVE Final   Staphylococcus aureus NEGATIVE NEGATIVE Final    Comment: (NOTE) The Xpert SA Assay (FDA approved for NASAL specimens in patients 29 years of age and older), is one component of a comprehensive surveillance program. It is not intended to diagnose infection nor to guide or monitor treatment. Performed at [facility], [place] [address] [place], [place] [address]   Resp Panel by RT-PCR (Flu A&B, Covid) Nasopharyngeal  Swab     Status: None   Collection Time: 09/04/20  5:00 PM   Specimen: Nasopharyngeal Swab;  Nasopharyngeal(NP) swabs in vial transport medium  Result Value Ref Range Status   SARS Coronavirus 2 by RT PCR NEGATIVE NEGATIVE Final    Comment: (NOTE) SARS-CoV-2 target nucleic acids are NOT DETECTED.  The SARS-CoV-2 RNA is generally detectable in upper respiratory specimens during the acute phase of infection. The lowest concentration of SARS-CoV-2 viral copies this assay can detect is 138 copies/mL. A negative result does not preclude SARS-Cov-2 infection and should not be used as the sole basis for treatment or other patient management decisions. A negative result may occur with  improper specimen collection/handling, submission of specimen other than nasopharyngeal swab, presence of viral mutation(s) within the areas targeted by this assay, and inadequate number of viral copies(<138 copies/mL). A negative result must be combined with clinical observations, patient history, and epidemiological information. The expected result is Negative.  Fact Sheet for Patients:  EntrepreneurPulse.com.au  Fact Sheet for Healthcare Providers:  IncredibleEmployment.be  This test is no t yet approved or cleared by the [place] FDA and  has been authorized for detection and/or diagnosis of SARS-CoV-2 by FDA under an Emergency Use Authorization (EUA). This EUA will remain  in effect (meaning this test can be used) for the duration of the COVID-19 declaration under Section 564(b)(1) of the Act, 21 U.S.C.section 360bbb-3(b)(1), unless the authorization is terminated  or revoked sooner.       Influenza A by PCR NEGATIVE NEGATIVE Final   Influenza B by PCR NEGATIVE NEGATIVE Final    Comment: (NOTE) The Xpert Xpress SARS-CoV-2/FLU/RSV plus assay is intended as an aid in the diagnosis of influenza from Nasopharyngeal swab specimens and should not be used as a sole basis for treatment. Nasal washings and aspirates are unacceptable for Xpert Xpress  SARS-CoV-2/FLU/RSV testing.  Fact Sheet for Patients: EntrepreneurPulse.com.au  Fact Sheet for Healthcare Providers: IncredibleEmployment.be  This test is not yet approved or cleared by the [place] FDA and has been authorized for detection and/or diagnosis of SARS-CoV-2 by FDA under an Emergency Use Authorization (EUA). This EUA will remain in effect (meaning this test can be used) for the duration of the COVID-19 declaration under Section 564(b)(1) of the Act, 21 U.S.C. section 360bbb-3(b)(1), unless the authorization is terminated or revoked.  Performed at [facility], [place] [address], [place], [place] [address]      Discharge Instructions:   Discharge Instructions     Diet - low sodium heart healthy   Complete by: As directed    Discharge wound care:   Complete by: As directed    Follow up with orthopedic surgeon for surgical wound care   Increase activity slowly   Complete by: As directed       Allergies as of 09/05/2020       Reactions   Lorazepam Other (See Comments)   Hallucinations   Metronidazole Rash        Medication List     TAKE these medications    Alphagan P 0.1 % Soln Generic drug: brimonidine Apply 1 drop to eye 2 (two) times daily.   amiodarone 200 MG tablet Commonly known as: PACERONE Take 100 mg by mouth every other day.   amLODipine 2.5 MG tablet Commonly known as: NORVASC Take 2.5 mg by mouth daily.   aspirin 81 MG chewable tablet Chew 1 tablet (81 mg total) by mouth 2 (two) times daily with a  meal.   atorvastatin 10 MG tablet Commonly known as: LIPITOR Take 10 mg by mouth daily.   clopidogrel 75 MG tablet Commonly known as: PLAVIX Take 75 mg by mouth daily.   ICAPS AREDS 2 PO Take 1 capsule by mouth daily.   isosorbide mononitrate 10 MG tablet Commonly known as: ISMO Take 10 mg by mouth 2 (two) times daily.   losartan-hydrochlorothiazide 50-12.5 MG tablet Commonly  known as: HYZAAR Take 1 tablet by mouth daily.   Lumigan 0.01 % Soln Generic drug: bimatoprost Place 1 drop into both eyes at bedtime.   ondansetron 4 MG tablet Commonly known as: ZOFRAN Take 4 mg by mouth every 8 (eight) hours as needed for nausea/vomiting.   pantoprazole 40 MG tablet Commonly known as: PROTONIX Take 40 mg by mouth every morning.   sertraline 50 MG tablet Commonly known as: ZOLOFT Take 50 mg by mouth daily.   sucralfate 1 g tablet Commonly known as: CARAFATE Take 1 g by mouth 2 (two) times daily.   traMADol 50 MG tablet Commonly known as: ULTRAM Take 1 tablet (50 mg total) by mouth every 6 (six) hours as needed for up to 7 days for moderate pain or severe pain.   vitamin B-12 1000 MCG tablet Commonly known as: CYANOCOBALAMIN Take 1,000 mcg by mouth daily.   Vitamin D 50 MCG (2000 UT) tablet Take 2,000 Units by mouth daily.               Discharge Care Instructions  (From admission, onward)           Start     Ordered   09/05/20 0000  Discharge wound care:       Comments: Follow up with orthopedic surgeon for surgical wound care   09/05/20 0856            Follow-up Information     Swinteck, Aaron Edelman, MD. Schedule an appointment as soon as possible for a visit in 2 week(s).   Specialty: Orthopedic Surgery Why: For wound re-check Contact information: [address] [place] [address] [id number]                  Time coordinating discharge: 31 minutes  Signed:  Jennye Boroughs  [facility] 09/05/2020, 8:57 AM   Pager on [email or web address]. If 7PM-7AM, please contact night-coverage at [email or web address]

## 2020-09-05 NOTE — Progress Notes (Addendum)
2244 Pt Bp 180/69(auto), Pt 182/64(manual),P 66,RR 17.Pt's pain level 7. Pt denies any other symptoms. J.Daniel,NP notified. Pain medication administered. RN will continue to monitor patient. Awaiting orders  [id number] Orders given   0000 Pt Bp 185/81,P 69,RR 17. J. Daniel,NP notified. Pt denies pain. J.Daniels,NP notified. RN will continue to monitor pt.Awaiting orders.   0021 Orders given  Pt Bp 152/64,P 81,RR 16. Pt denies pain. Pt denies any symptoms. RN will continue to monitor pt.

## 2020-09-05 NOTE — Plan of Care (Signed)
  Problem: Clinical Measurements: Goal: Will remain free from infection Outcome: Progressing Goal: Diagnostic test results will improve Outcome: Progressing   

## 2020-09-05 NOTE — TOC Transition Note (Signed)
Transition of Care [facility]) - CM/SW Discharge Note   Patient Details  Name: Sheri Hensley MRN: [id number] Date of Birth: [birth year]  [facility]) CM/SW Contact:  Milinda Antis, [place] Phone Number: 09/05/2020, 10:28 AM   Clinical Narrative:    Patient will DC to: [facility] Anticipated DC date: 09/05/2020 Family notified: Yes Transport by: Corey Harold   Per MD patient ready for DC to SNF. RN to call report prior to discharge [phone] . RN, patient, patient's family, and facility notified of DC. Discharge Summary and FL2 sent to facility. DC packet on chart. Ambulance transport requested for patient.   CSW will sign off for now as social work intervention is no longer needed. Please consult [place] again if new needs arise.     Final next level of care: Skilled Nursing Facility Barriers to Discharge: No Barriers Identified   Patient Goals and CMS Choice        Discharge Placement              Patient chooses bed at:  [facility]) Patient to be transferred to facility by: [place] Name of family member notified: Apple,Vickie (Daughter)   [phone] Patient and family notified of of transfer: 09/05/20  Discharge Plan and Services                                     Social Determinants of Health (SDOH) Interventions     Readmission Risk Interventions No flowsheet data found.

## 2020-09-08 ENCOUNTER — Other Ambulatory Visit: Payer: Medicare HMO

## 2020-09-08 ENCOUNTER — Encounter: Payer: Self-pay | Admitting: Cardiology

## 2020-10-21 ENCOUNTER — Ambulatory Visit
Admission: RE | Admit: 2020-10-21 | Discharge: 2020-10-21 | Disposition: A | Discharge status: Home / Self Care | Payer: Medicare HMO | Source: Ambulatory Visit | Attending: Gastroenterology | Admitting: Gastroenterology

## 2020-10-21 ENCOUNTER — Other Ambulatory Visit: Payer: Self-pay | Admitting: Gastroenterology

## 2020-10-21 DIAGNOSIS — R112 Nausea with vomiting, unspecified: Secondary | ICD-10-CM

## 2020-10-21 DIAGNOSIS — R933 Abnormal findings on diagnostic imaging of other parts of digestive tract: Secondary | ICD-10-CM

## 2020-10-21 DIAGNOSIS — K5900 Constipation, unspecified: Secondary | ICD-10-CM

## 2020-10-21 DIAGNOSIS — R1013 Epigastric pain: Secondary | ICD-10-CM

## 2020-10-21 DIAGNOSIS — C549 Malignant neoplasm of corpus uteri, unspecified: Secondary | ICD-10-CM

## 2021-02-23 ENCOUNTER — Other Ambulatory Visit: Payer: Self-pay

## 2021-02-23 DIAGNOSIS — Z8542 Personal history of malignant neoplasm of other parts of uterus: Secondary | ICD-10-CM

## 2021-02-23 DIAGNOSIS — R93811 Abnormal radiologic findings on diagnostic imaging of right testicle: Secondary | ICD-10-CM

## 2021-02-23 HISTORY — DX: Abnormal radiologic findings on diagnostic imaging of right testicle: R93.811

## 2021-02-23 HISTORY — DX: Personal history of malignant neoplasm of other parts of uterus: Z85.42

## 2021-02-27 ENCOUNTER — Ambulatory Visit: Payer: Medicare HMO | Admitting: Cardiology

## 2021-04-28 DIAGNOSIS — G4700 Insomnia, unspecified: Secondary | ICD-10-CM

## 2021-04-28 DIAGNOSIS — F329 Major depressive disorder, single episode, unspecified: Secondary | ICD-10-CM

## 2021-04-28 DIAGNOSIS — F32A Depression, unspecified: Secondary | ICD-10-CM | POA: Insufficient documentation

## 2021-04-28 HISTORY — DX: Insomnia, unspecified: G47.00

## 2021-04-28 HISTORY — DX: Major depressive disorder, single episode, unspecified: F32.9

## 2021-04-30 ENCOUNTER — Ambulatory Visit: Payer: Medicare HMO | Admitting: Cardiology

## 2021-04-30 ENCOUNTER — Encounter: Payer: Self-pay | Admitting: Cardiology

## 2021-04-30 VITALS — BP 122/66 | HR 71 | Ht 65.0 in | Wt 134.0 lb

## 2021-04-30 DIAGNOSIS — I10 Essential (primary) hypertension: Secondary | ICD-10-CM

## 2021-04-30 DIAGNOSIS — I480 Paroxysmal atrial fibrillation: Secondary | ICD-10-CM | POA: Diagnosis not present

## 2021-04-30 DIAGNOSIS — G459 Transient cerebral ischemic attack, unspecified: Secondary | ICD-10-CM

## 2021-04-30 DIAGNOSIS — I2510 Atherosclerotic heart disease of native coronary artery without angina pectoris: Secondary | ICD-10-CM | POA: Diagnosis not present

## 2021-04-30 NOTE — Progress Notes (Signed)
?Cardiology Office Note:   ? ?Date:  04/30/2021  ? ?ID:  Sheri Hensley, DOB [birth year], MRN [id number] ? ?PCP:  Josetta Huddle, MD  ?Cardiologist:  Jenean Lindau, MD  ? ?Referring MD: Josetta Huddle, MD  ? ? ?ASSESSMENT:   ? ?1. Paroxysmal atrial fibrillation (HCC)   ?2. Essential (primary) hypertension   ?3. TIA (transient ischemic attack)   ?4. Coronary artery disease involving native coronary artery of native heart without angina pectoris   ? ?PLAN:   ? ?In order of problems listed above: ? ?Every prevention stressed with the patient.  Importance of compliance with diet medication stressed and she vocalized understanding. ?Essential hypertension: Blood pressure stable and diet was emphasized.  I discontinued 2.5 mg amlodipine in view of borderline blood pressure and her advanced age. ?Paroxysmal atrial fibrillation: On amiodarone therapy.  Lipid panel done recently was fine.  She takes 100 mg a day.  Benefits and potential is explained and she vocalized understanding and questions were answered to her satisfaction. ?Mixed dyslipidemia: On statin therapy and lipids reviewed from primary care lab work. ?Renal insufficiency: Monitored by primary care.  Stable. ?Patient will be seen in follow-up appointment in 9 months or earlier if the patient has any concerns ? ? ? ?Medication Adjustments/Labs and Tests Ordered: ?Current medicines are reviewed at length with the patient today.  Concerns regarding medicines are outlined above.  ?No orders of the defined types were placed in this encounter. ? ?No orders of the defined types were placed in this encounter. ? ? ? ?No chief complaint on file. ?  ? ?History of Present Illness:   ? ?Sheri Hensley is a 88 y.o. female.  Patient has past medical history of paroxysmal atrial fibrillation, essential hypertension, renal insufficiency and mixed dyslipidemia.  She looks much better than her stated age.  She denies any chest pain orthopnea or PND.  She is not on  anticoagulation because of unstable gait.  She uses a walker to ambulate.  Recently she fell and had a hip fracture for which she underwent treatment and much better now.  She lives beside her daughter who is very supportive.  She has home health. ? ?Past Medical History:  ?Diagnosis Date  ? Abnormal radiologic findings on diagnostic imaging of right testicle 02/23/2021  ? Acquired absence of both cervix and uterus 03/11/2020  ? Acquired thrombophilia ([place]) 03/11/2020  ? Acute respiratory failure with hypoxia ([place]) 01/08/2019  ? AF (paroxysmal atrial fibrillation) ([place]) 08/29/2020  ? AKI (acute kidney injury) ([place]) 08/29/2020  ? Atrial fibrillation ([place])   ? Bruises easily   ? Cancer [facility]) 1998  ? left tamoxifem x 5 yrs  ? Chronic kidney disease due to hypertension 03/11/2020  ? Closed right hip fracture ([place]) 08/28/2020  ? Constipation 03/11/2020  ? Coronary artery disease   ? CARDIOLOGIST-  DR Geraldo Pitter ([place])  ? COVID-19 virus infection 01/08/2019  ? Elevated bilirubin 12/23/2015  ? Endometrial cancer ([place])   ? Endometrial cancer ([place])   ? Essential (primary) hypertension 10/24/2014  ? Essential hypertension 10/24/2014  ? Falls 06/2017  ? Patient fell and hit her head , patient's daughter's states patient may have fell on the kitchen counter  ? Frailty 03/11/2020  ? GERD (gastroesophageal reflux disease)   ? Glaucoma, both eyes   ? Herpes zoster without complication 63/01/6008  ? History of breast cancer   ? 1998--  s/p  left mastectomy with sln dissection's  ,  no chemo or  radiation-  no recurrence  ? History of cancer of uterine body 02/23/2021  ? History of Clostridium difficile infection 5-6 yrs ago  ? Hyperlipidemia   ? Hypertension   ? Hyponatremia 12/23/2015  ? Insomnia 04/28/2021  ? Macular degeneration   ? Major depression 04/28/2021  ? Narrow complex tachycardia ([place])   ? cardiologist-  dr Salley Scarlet  ? OA (osteoarthritis)   ? right knee  ? Overactive bladder 03/11/2020  ? Paroxysmal atrial  fibrillation ([place]) 12/23/2015  ? Personal history of transient ischemic attack (TIA), and cerebral infarction without residual deficits 12/22/2015  ? yrs ago ? AND QUESTIONABLE ONE YRS AGO  ? PMB (postmenopausal bleeding)   ? Pneumonia due to COVID-19 virus 01/08/2019  ? PONV (postoperative nausea and vomiting)   ? ponv after appendectomy 1954, DID WELL AFTER D AND C RECENT  ? PONV (postoperative nausea and vomiting)   ? Post-traumatic osteoarthritis of both knees 10/15/2013  ? Postmenopausal bleeding 11/10/2015  ? 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 Standard chest x-ray abnormal 03/11/2020  ? Thickened endometrium   ? TIA (transient ischemic attack) 12/22/2015  ? Type 2 diabetes mellitus with other specified complication ([place]) 88/32/5498  ? Vitamin D deficiency 03/11/2020  ? Wears glasses   ? ? ?Past Surgical History:  ?Procedure Laterality Date  ? APPENDECTOMY  1954  ? CATARACT EXTRACTION W/ INTRAOCULAR LENS  IMPLANT, BILATERAL  1994  ? colonscopy    ? DILATION AND CURETTAGE OF UTERUS N/A 11/20/2015  ? Procedure: DILATATION AND CURETTAGE;  Surgeon: Everitt Amber, MD;  Location: [facility];  Service: Gynecology;  Laterality: N/A;  ? DILATION AND CURETTAGE, DIAGNOSTIC / THERAPEUTIC N/A novmber 2017  ? per daughter with biopsy as stated  ? KNEE ARTHROSCOPY Right 2002  ? MASTECTOMY Left 1998  ? w/ lympn node dissection's  ? ROBOTIC ASSISTED TOTAL HYSTERECTOMY WITH BILATERAL SALPINGO OOPHERECTOMY Bilateral 01/27/2016  ? Procedure: XI ROBOTIC ASSISTED TOTAL HYSTERECTOMY WITH BILATERAL SALPINGO OOPHORECTOMY;  Surgeon: Everitt Amber, MD;  Location: [facility];  Service: Gynecology;  Laterality: Bilateral;  ? TOTAL HIP ARTHROPLASTY Right 08/30/2020  ? Procedure: HEMI  HIP ARTHROPLASTY ANTERIOR APPROACH;  Surgeon: Rod Can, MD;  Location: [facility];  Service: Orthopedics;  Laterality: Right;  ? ? ?Current Medications: ?Current Meds  ?Medication Sig  ? acetaminophen (TYLENOL) 325 MG tablet Take 2  tablets (650 mg total) by mouth every 6 (six) hours as needed for mild pain or headache (fever >/= 101).  ? ALPHAGAN P 0.1 % SOLN Apply 1 drop to eye 2 (two) times daily.  ? amiodarone (PACERONE) 200 MG tablet Take 100 mg by mouth every other day.  ? amLODipine (NORVASC) 2.5 MG tablet Take 2.5 mg by mouth daily.  ? atorvastatin (LIPITOR) 10 MG tablet Take 1 tablet (10 mg total) by mouth at bedtime.  ? bimatoprost (LUMIGAN) 0.01 % SOLN Place 1 drop into both eyes at bedtime.  ? Cholecalciferol (VITAMIN D) 50 MCG (2000 UT) tablet Take 2,000 Units by mouth daily.  ? clopidogrel (PLAVIX) 75 MG tablet Take 1 tablet (75 mg total) by mouth daily.  ? isosorbide mononitrate (ISMO) 10 MG tablet Take 10 mg by mouth 2 (two) times daily.  ? loratadine (CLARITIN) 10 MG tablet Take 10 mg by mouth at bedtime.  ? losartan-hydrochlorothiazide (HYZAAR) 50-12.5 MG tablet Take 1 tablet by mouth daily.  ? melatonin 5 MG TABS Take 5 mg by mouth at bedtime.  ? Multiple Vitamins-Minerals (CENTRUM VITAMINTS PO) Take  1 tablet by mouth every morning.  ? Multiple Vitamins-Minerals (ICAPS AREDS 2 PO) Take 1 capsule by mouth daily.  ? Omega-3 Fatty Acids (FISH OIL) 1000 MG CAPS Take 2 capsules (2,000 mg total) by mouth in the morning and at bedtime.  ? omeprazole (PRILOSEC) 20 MG capsule Take 20 mg by mouth daily. Monday through Friday only  ? ondansetron (ZOFRAN) 4 MG tablet Take 4 mg by mouth every 8 (eight) hours as needed for nausea/vomiting.  ? pantoprazole (PROTONIX) 40 MG tablet Take 40 mg by mouth every morning.  ? Probiotic Product (PROBIOTIC PO) Take 1 capsule by mouth daily in the afternoon.  ? sertraline (ZOLOFT) 50 MG tablet Take 50 mg by mouth daily.  ? sucralfate (CARAFATE) 1 g tablet Take 1 g by mouth 2 (two) times daily.  ? vitamin B-12 (CYANOCOBALAMIN) 1000 MCG tablet Take 1,000 mcg by mouth daily.  ?  ? ?Allergies:   Lorazepam, Ativan [lorazepam], and Metronidazole  ? ?Social History  ? ?Socioeconomic History  ? Marital status:  Widowed  ?  Spouse name: Not on file  ? Number of children: 1  ? Years of education: Not on file  ? Highest education level: Not on file  ?Occupational History  ? Not on file  ?Tobacco Use  ? Smoking statu

## 2021-04-30 NOTE — Patient Instructions (Addendum)
Medication Instructions:  ?Your physician has recommended you make the following change in your medication:  ? ?Stop Amlodipine ? ?*If you need a refill on your cardiac medications before your next appointment, please call your pharmacy* ? ? ?Lab Work: ?None ordered ?If you have labs (blood work) drawn today and your tests are completely normal, you will receive your results only by: ?MyChart Message (if you have MyChart) OR ?A paper copy in the mail ?If you have any lab test that is abnormal or we need to change your treatment, we will call you to review the results. ? ? ?Testing/Procedures: ?Your physician has requested that you have an echocardiogram. Echocardiography is a painless test that uses sound waves to create images of your heart. It provides your doctor with information about the size and shape of your heart and how well your heart?s chambers and valves are working. This procedure takes approximately one hour. There are no restrictions for this procedure. ? ? ? ?Follow-Up: ?At [facility], you and your health needs are our priority.  As part of our continuing mission to provide you with exceptional heart care, we have created designated Provider Care Teams.  These Care Teams include your primary Cardiologist (physician) and Advanced Practice Providers (APPs -  Physician Assistants and Nurse Practitioners) who all work together to provide you with the care you need, when you need it. ? ?We recommend signing up for the patient portal called "MyChart".  Sign up information is provided on this After Visit Summary.  MyChart is used to connect with patients for Virtual Visits (Telemedicine).  Patients are able to view lab/test results, encounter notes, upcoming appointments, etc.  Non-urgent messages can be sent to your provider as well.   ?To learn more about what you can do with MyChart, go to [email or web address].   ? ?Your next appointment:   ?9 month(s) ? ?The format for your next appointment:    ?In Person ? ?Provider:   ?Jyl Heinz, MD ? ? ?Other Instructions ?Echocardiogram ?An echocardiogram is a test that uses sound waves (ultrasound) to produce images of the heart. ?Images from an echocardiogram can provide important information about: ?Heart size and shape. ?The size and thickness and movement of your heart's walls. ?Heart muscle function and strength. ?Heart valve function or if you have stenosis. Stenosis is when the heart valves are too narrow. ?If blood is flowing backward through the heart valves (regurgitation). ?A tumor or infectious growth around the heart valves. ?Areas of heart muscle that are not working well because of poor blood flow or injury from a heart attack. ?Aneurysm detection. An aneurysm is a weak or damaged part of an artery wall. The wall bulges out from the normal force of blood pumping through the body. ?Tell a health care provider about: ?Any allergies you have. ?All medicines you are taking, including vitamins, herbs, eye drops, creams, and over-the-counter medicines. ?Any blood disorders you have. ?Any surgeries you have had. ?Any medical conditions you have. ?Whether you are pregnant or may be pregnant. ?What are the risks? ?Generally, this is a safe test. However, problems may occur, including an allergic reaction to dye (contrast) that may be used during the test. ?What happens before the test? ?No specific preparation is needed. You may eat and drink normally. ?What happens during the test? ?You will take off your clothes from the waist up and put on a hospital gown. ?Electrodes or electrocardiogram (ECG)patches may be placed on your chest. The electrodes or patches are  then connected to a device that monitors your heart rate and rhythm. ?You will lie down on a table for an ultrasound exam. A gel will be applied to your chest to help sound waves pass through your skin. ?A handheld device, called a transducer, will be pressed against your chest and moved over your  heart. The transducer produces sound waves that travel to your heart and bounce back (or "echo" back) to the transducer. These sound waves will be captured in real-time and changed into images of your heart that can be viewed on a video monitor. The images will be recorded on a computer and reviewed by your health care provider. ?You may be asked to change positions or hold your breath for a short time. This makes it easier to get different views or better views of your heart. ?In some cases, you may receive contrast through an IV in one of your veins. This can improve the quality of the pictures from your heart. ?The procedure may vary among health care providers and hospitals.   ?What can I expect after the test? ?You may return to your normal, everyday life, including diet, activities, and medicines, unless your health care provider tells you not to do that. ?Follow these instructions at home: ?It is up to you to get the results of your test. Ask your health care provider, or the department that is doing the test, when your results will be ready. ?Keep all follow-up visits. This is important. ?Summary ?An echocardiogram is a test that uses sound waves (ultrasound) to produce images of the heart. ?Images from an echocardiogram can provide important information about the size and shape of your heart, heart muscle function, heart valve function, and other possible heart problems. ?You do not need to do anything to prepare before this test. You may eat and drink normally. ?After the echocardiogram is completed, you may return to your normal, everyday life, unless your health care provider tells you not to do that. ?This information is not intended to replace advice given to you by your health care provider. Make sure you discuss any questions you have with your health care provider. ?Document Revised: 08/28/2019 Document Reviewed: 08/28/2019 ?Elsevier Patient Education ? [place]. ? ? ?

## 2021-05-12 ENCOUNTER — Ambulatory Visit (HOSPITAL_BASED_OUTPATIENT_CLINIC_OR_DEPARTMENT_OTHER)
Admission: RE | Admit: 2021-05-12 | Discharge: 2021-05-12 | Disposition: A | Discharge status: Home / Self Care | Payer: Medicare HMO | Source: Ambulatory Visit | Attending: Cardiology | Admitting: Cardiology

## 2021-05-12 ENCOUNTER — Ambulatory Visit: Payer: Medicare HMO | Admitting: Cardiology

## 2021-05-12 DIAGNOSIS — I480 Paroxysmal atrial fibrillation: Secondary | ICD-10-CM | POA: Diagnosis not present

## 2021-05-12 DIAGNOSIS — I2510 Atherosclerotic heart disease of native coronary artery without angina pectoris: Secondary | ICD-10-CM | POA: Diagnosis not present

## 2021-05-12 LAB — ECHOCARDIOGRAM COMPLETE
AR max vel: 1.63 cm2
AV Area VTI: 1.69 cm2
AV Area mean vel: 1.64 cm2
AV Mean grad: 17 mmHg
AV Peak grad: 30.3 mmHg
Ao pk vel: 2.75 m/s
Area-P 1/2: 2.93 cm2
S' Lateral: 2.7 cm

## 2021-05-12 NOTE — Progress Notes (Signed)
?  Echocardiogram ?2D Echocardiogram has been performed. ? ?Elmer Ramp ?05/12/2021, 3:10 PM ?

## 2021-10-06 ENCOUNTER — Other Ambulatory Visit: Payer: Self-pay | Admitting: Cardiology

## 2021-10-07 NOTE — Telephone Encounter (Signed)
Refills sent to pharmacy. 

## 2022-03-04 ENCOUNTER — Ambulatory Visit: Payer: Medicare HMO | Attending: Cardiology | Admitting: Cardiology

## 2022-03-04 ENCOUNTER — Encounter: Payer: Self-pay | Admitting: Cardiology

## 2022-03-04 VITALS — BP 142/70 | HR 74 | Ht 63.0 in | Wt 133.0 lb

## 2022-03-04 DIAGNOSIS — I10 Essential (primary) hypertension: Secondary | ICD-10-CM | POA: Diagnosis not present

## 2022-03-04 DIAGNOSIS — I2510 Atherosclerotic heart disease of native coronary artery without angina pectoris: Secondary | ICD-10-CM

## 2022-03-04 DIAGNOSIS — I480 Paroxysmal atrial fibrillation: Secondary | ICD-10-CM

## 2022-03-04 NOTE — Patient Instructions (Signed)

## 2022-03-04 NOTE — Progress Notes (Signed)
Cardiology Office Note:    Date:  03/04/2022   ID:  [name], DOB Jul 16, 1919, MRN [id number]  PCP:  Josetta Huddle, MD  Cardiologist:  Jenean Lindau, MD   Referring MD: Josetta Huddle, MD    ASSESSMENT:    1. Paroxysmal atrial fibrillation (HCC)   2. Essential hypertension   3. Coronary artery disease involving native coronary artery of native heart without angina pectoris    PLAN:    In order of problems listed above:  Coronary artery disease: Secondary prevention stressed with the patient.  Importance of compliance with diet medication stressed and she vocalized understanding. Paroxysmal atrial fibrillation: On amiodarone therapy and in sinus rhythm clinically.  Return not on anticoagulation because of issues with her vision and gait instability. Mixed dyslipidemia: On lipid-lowering therapy managed by primary care.  She gets all her blood work done by primary care. Amiodarone therapy: Benefits and risks visited and she and her daughter vocalized understanding and questions were answered to their satisfaction. Daughter mentions to me that one of her previous notes had mentioned diabetes mellitus by another provider.  I do not see any evidence for this.  She and her daughter mentions that they have no history of diabetes mellitus. Patient will be seen in follow-up appointment in 9 months or earlier if the patient has any concerns    Medication Adjustments/Labs and Tests Ordered: Current medicines are reviewed at length with the patient today.  Concerns regarding medicines are outlined above.  No orders of the defined types were placed in this encounter.  No orders of the defined types were placed in this encounter.    No chief complaint on file.    History of Present Illness:    Sheri Hensley is a 89 y.o. female.  Patient has past medical history of paroxysmal atrial fibrillation, coronary artery disease and dyslipidemia.  She denies any problems at  this time and takes care of activities of daily living.  She ambulates age appropriately.  She ambulates with a walker.  Her daughter lives next door.  At the time of my evaluation, the patient is alert awake oriented and in no distress.  Past Medical History:  Diagnosis Date   Abnormal radiologic findings on diagnostic imaging of right testicle 02/23/2021   Acquired absence of both cervix and uterus 03/11/2020   Acquired thrombophilia ([place]) 03/11/2020   Acute respiratory failure with hypoxia (HCC) 01/08/2019   AF (paroxysmal atrial fibrillation) ([place]) 08/29/2020   AKI (acute kidney injury) ([place]) 08/29/2020   Atrial fibrillation ([place])    Bruises easily    Cancer ([place]) 1998   left tamoxifem x 5 yrs   Chronic kidney disease due to hypertension 03/11/2020   Closed right hip fracture ([place]) 08/28/2020   Constipation 03/11/2020   Coronary artery disease    CARDIOLOGIST-  DR Geraldo Pitter ([facility])   COVID-19 virus infection 01/08/2019   Elevated bilirubin 12/23/2015   Endometrial cancer ([place])    Endometrial cancer ([place])    Essential (primary) hypertension 10/24/2014   Essential hypertension 10/24/2014   Falls 06/2017   Patient fell and hit her head , patient's daughter's states patient may have fell on the kitchen counter   Frailty 03/11/2020   GERD (gastroesophageal reflux disease)    Glaucoma, both eyes    Herpes zoster without complication 53/66/4403   History of breast cancer    1998--  s/p  left mastectomy with sln dissection's  ,  no chemo or radiation-  no  recurrence   History of cancer of uterine body 02/23/2021   History of Clostridium difficile infection 5-6 yrs ago   Hyperlipidemia    Hypertension    Hyponatremia 12/23/2015   Insomnia 04/28/2021   Macular degeneration    Major depression 04/28/2021   Narrow complex tachycardia    cardiologist-  dr Salley Scarlet   OA (osteoarthritis)    right knee   Overactive bladder 03/11/2020   Paroxysmal atrial fibrillation  ([place]) 12/23/2015   Personal history of transient ischemic attack (TIA), and cerebral infarction without residual deficits 12/22/2015   yrs ago ? 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   Standard chest x-ray abnormal 03/11/2020   Thickened endometrium    TIA (transient ischemic attack) 12/22/2015   Type 2 diabetes mellitus with other specified complication ([place]) 88/91/6945   Vitamin D deficiency 03/11/2020   Wears glasses     Past Surgical History:  Procedure Laterality Date   APPENDECTOMY  1954   CATARACT EXTRACTION W/ INTRAOCULAR LENS  IMPLANT, BILATERAL  1994   colonscopy     DILATION AND CURETTAGE OF UTERUS N/A 11/20/2015   Procedure: DILATATION AND CURETTAGE;  Surgeon: Everitt Amber, MD;  Location: [facility];  Service: Gynecology;  Laterality: N/A;   DILATION AND CURETTAGE, DIAGNOSTIC / THERAPEUTIC N/A novmber 2017   per daughter with biopsy as stated   KNEE ARTHROSCOPY Right 2002   MASTECTOMY Left 1998   w/ lympn node dissection's   ROBOTIC ASSISTED TOTAL HYSTERECTOMY WITH BILATERAL SALPINGO OOPHERECTOMY Bilateral 01/27/2016   Procedure: XI ROBOTIC ASSISTED TOTAL HYSTERECTOMY WITH BILATERAL SALPINGO OOPHORECTOMY;  Surgeon: Everitt Amber, MD;  Location: [facility];  Service: Gynecology;  Laterality: Bilateral;   TOTAL HIP ARTHROPLASTY Right 08/30/2020   Procedure: HEMI  HIP ARTHROPLASTY ANTERIOR APPROACH;  Surgeon: Rod Can, MD;  Location: [facility];  Service: Orthopedics;  Laterality: Right;    Current Medications: Current Meds  Medication Sig   acetaminophen (TYLENOL) 325 MG tablet Take 2 tablets (650  mg total) by mouth every 6 (six) hours as needed for mild pain or headache (fever >/= 101).   ALPHAGAN P 0.1 % SOLN Apply 1 drop to eye 2 (two) times daily.   amiodarone (PACERONE) 200 MG tablet TAKE 1/2 TABLET BY MOUTH EVERY MORNING   atorvastatin (LIPITOR) 10 MG tablet Take 1 tablet (10 mg total) by mouth at bedtime.   bimatoprost (LUMIGAN) 0.01 % SOLN Place 1 drop into both eyes at bedtime.   Cholecalciferol (VITAMIN D) 50 MCG (2000 UT) tablet Take 2,000 Units by mouth daily.   clopidogrel (PLAVIX) 75 MG tablet Take 1 tablet (75 mg total) by mouth daily.   isosorbide mononitrate (ISMO) 10 MG tablet Take 10 mg by mouth 2 (two) times daily.   loratadine (CLARITIN) 10 MG tablet Take 10 mg by mouth at bedtime.   losartan-hydrochlorothiazide (HYZAAR) 50-12.5 MG tablet Take 1 tablet by mouth daily.   melatonin 5 MG TABS Take 5 mg by mouth at bedtime.   Multiple Vitamins-Minerals (CENTRUM VITAMINTS PO) Take 1 tablet by mouth every morning.   Multiple Vitamins-Minerals (ICAPS AREDS 2 PO) Take 1 capsule by  mouth daily.   Omega-3 Fatty Acids (FISH OIL) 1000 MG CAPS Take 2 capsules (2,000 mg total) by mouth in the morning and at bedtime.   ondansetron (ZOFRAN) 4 MG tablet Take 4 mg by mouth every 8 (eight) hours as needed for nausea/vomiting.   pantoprazole (PROTONIX) 40 MG tablet Take 40 mg by mouth every morning.   Probiotic Product (PROBIOTIC PO) Take 1 capsule by mouth daily in the afternoon.   sertraline (ZOLOFT) 50 MG tablet Take 50 mg by mouth daily.   sucralfate (CARAFATE) 1 g tablet Take 1 g by mouth every morning.   sucralfate (CARAFATE) 1 GM/10ML suspension Take 10 mLs by mouth at bedtime.   vitamin B-12 (CYANOCOBALAMIN) 1000 MCG tablet Take 1,000 mcg by mouth daily.     Allergies:   Lorazepam, Ativan [lorazepam], and Metronidazole   Social History   Socioeconomic History   Marital status: Widowed    Spouse name: Not on file   Number of children: 1   Years of education: Not on file    Highest education level: Not on file  Occupational History   Not on file  Tobacco Use   Smoking status: Never   Smokeless tobacco: Never  Vaping Use   Vaping Use: Never used  Substance and Sexual Activity   Alcohol use: Never    Comment: occasional   Drug use: Never   Sexual activity: Never  Other Topics Concern   Not on file  Social History Narrative   ** Merged History Encounter **       Social Determinants of Health   Financial Resource Strain: Not on file  Food Insecurity: Not on file  Transportation Needs: Not on file  Physical Activity: Not on file  Stress: Not on file  Social Connections: Not on file     Family History: The patient's family history includes Colon cancer in her brother; Hypertension in her father and mother; Liver cancer in her brother.  ROS:   Please see the history of present illness.    All other systems reviewed and are negative.  EKGs/Labs/Other Studies Reviewed:    The following studies were reviewed today: I discussed my findings with the patient at length.   Recent Labs: No results found for requested labs within last 365 days.  Recent Lipid Panel    Component Value Date/Time   CHOL 127 08/10/2016 1615   TRIG 201 (H) 01/08/2019 0358   HDL 32 (L) 08/10/2016 1615   CHOLHDL 4.0 08/10/2016 1615   CHOLHDL 3.2 12/23/2015 0425   VLDL 26 12/23/2015 0425   LDLCALC 31 08/10/2016 1615    Physical Exam:    VS:  BP (!) 142/70   Pulse 74   Ht '5\' 3"'$  (1.6 m)   Wt 133 lb (60.3 kg)   SpO2 98%   BMI 23.56 kg/m     Wt Readings from Last 3 Encounters:  03/04/22 133 lb (60.3 kg)  04/30/21 134 lb 0.6 oz (60.8 kg)  08/28/20 133 lb (60.3 kg)     GEN: Patient is in no acute distress HEENT: Normal NECK: No JVD; No carotid bruits LYMPHATICS: No lymphadenopathy CARDIAC: Hear sounds regular, 2/6 systolic murmur at the apex. RESPIRATORY:  Clear to auscultation without rales, wheezing or rhonchi  ABDOMEN: Soft, non-tender,  non-distended MUSCULOSKELETAL:  No edema; No deformity  SKIN: Warm and dry NEUROLOGIC:  Alert and oriented x 3 PSYCHIATRIC:  Normal affect   Signed, Jenean Lindau, MD  03/04/2022 2:29 PM  [facility]

## 2022-05-21 ENCOUNTER — Ambulatory Visit
Admission: RE | Admit: 2022-05-21 | Discharge: 2022-05-21 | Disposition: A | Discharge status: Home / Self Care | Payer: Medicare HMO | Source: Ambulatory Visit | Attending: Internal Medicine | Admitting: Internal Medicine

## 2022-05-21 ENCOUNTER — Other Ambulatory Visit: Payer: Self-pay | Admitting: Internal Medicine

## 2022-05-21 DIAGNOSIS — M25551 Pain in right hip: Secondary | ICD-10-CM

## 2022-05-29 ENCOUNTER — Other Ambulatory Visit: Payer: Self-pay | Admitting: Cardiology

## 2023-02-04 ENCOUNTER — Other Ambulatory Visit: Payer: Self-pay | Admitting: Cardiology

## 2023-05-28 ENCOUNTER — Emergency Department (HOSPITAL_BASED_OUTPATIENT_CLINIC_OR_DEPARTMENT_OTHER)
Admission: EM | Admit: 2023-05-28 | Discharge: 2023-05-28 | Disposition: A | Discharge status: Home / Self Care | Attending: Emergency Medicine | Admitting: Emergency Medicine

## 2023-05-28 ENCOUNTER — Emergency Department (HOSPITAL_BASED_OUTPATIENT_CLINIC_OR_DEPARTMENT_OTHER)

## 2023-05-28 ENCOUNTER — Other Ambulatory Visit: Payer: Self-pay

## 2023-05-28 ENCOUNTER — Encounter (HOSPITAL_BASED_OUTPATIENT_CLINIC_OR_DEPARTMENT_OTHER): Payer: Self-pay | Admitting: Emergency Medicine

## 2023-05-28 ENCOUNTER — Emergency Department (HOSPITAL_BASED_OUTPATIENT_CLINIC_OR_DEPARTMENT_OTHER): Admitting: Radiology

## 2023-05-28 DIAGNOSIS — Z7902 Long term (current) use of antithrombotics/antiplatelets: Secondary | ICD-10-CM | POA: Insufficient documentation

## 2023-05-28 DIAGNOSIS — Z79899 Other long term (current) drug therapy: Secondary | ICD-10-CM | POA: Insufficient documentation

## 2023-05-28 DIAGNOSIS — W01198A Fall on same level from slipping, tripping and stumbling with subsequent striking against other object, initial encounter: Secondary | ICD-10-CM | POA: Insufficient documentation

## 2023-05-28 DIAGNOSIS — I10 Essential (primary) hypertension: Secondary | ICD-10-CM | POA: Diagnosis not present

## 2023-05-28 DIAGNOSIS — I2510 Atherosclerotic heart disease of native coronary artery without angina pectoris: Secondary | ICD-10-CM | POA: Diagnosis not present

## 2023-05-28 DIAGNOSIS — I4891 Unspecified atrial fibrillation: Secondary | ICD-10-CM | POA: Insufficient documentation

## 2023-05-28 DIAGNOSIS — S5012XA Contusion of left forearm, initial encounter: Secondary | ICD-10-CM | POA: Insufficient documentation

## 2023-05-28 DIAGNOSIS — S0990XA Unspecified injury of head, initial encounter: Secondary | ICD-10-CM | POA: Diagnosis present

## 2023-05-28 DIAGNOSIS — W19XXXA Unspecified fall, initial encounter: Secondary | ICD-10-CM

## 2023-05-28 MED ORDER — LIDOCAINE 5 % EX PTCH
1.0000 | MEDICATED_PATCH | Freq: Once | CUTANEOUS | Status: DC
  Administered 2023-05-28: 1 via TRANSDERMAL
  Filled 2023-05-28: qty 1

## 2023-05-28 MED ORDER — ACETAMINOPHEN 500 MG PO TABS
1000.0000 mg | ORAL_TABLET | Freq: Once | ORAL | Status: AC
  Administered 2023-05-28: 1000 mg via ORAL
  Filled 2023-05-28: qty 2

## 2023-05-28 MED ORDER — LIDOCAINE 5 % EX PTCH: 1.0000 | MEDICATED_PATCH | CUTANEOUS | 0 refills | Status: DC

## 2023-05-28 NOTE — ED Provider Notes (Signed)
 [place] EMERGENCY DEPARTMENT AT [facility] Provider Note   CSN: [id number] Arrival date & time: 05/28/23  1238     History  Chief Complaint  Patient presents with   Sheri Hensley is a 89 y.o. female.  Patient is a 89 year old female with a past medical history of A-fib not on AC, CAD on Plavix , hypertension presenting to the emergency department after a fall.  The patient states that she was making her bed this morning when she lost her balance and fell and landed on her low back.  She is unsure if she hit her head but denied any loss of consciousness.  Her daughter states that she has a camera and noticed that she was unable to see her so went over to her house to check on her and found her on the ground.  She states that she was on the ground for maybe 30 to 45 minutes before being helped up.  She denied any lightheadedness or dizziness, chest pain or shortness of breath prior to falling.  She denies any numbness or weakness, saddle anesthesia, loss of bowel or bladder function.  Her daughter also noticed some bruising to her left forearm however the patient denies any pain there.  The history is provided by the patient and a relative.  Fall       Home Medications Prior to Admission medications   Medication Sig Start Date End Date Taking? Authorizing Provider  acetaminophen  (TYLENOL ) 325 MG tablet Take 2 tablets (650 mg total) by mouth every 6 (six) hours as needed for mild pain or headache (fever >/= 101). 01/18/19   Elgergawy, Ardia Kraft, MD  ALPHAGAN  P 0.1 % SOLN Apply 1 drop to eye 2 (two) times daily. 04/02/20   [provider]  amiodarone  (PACERONE ) 200 MG tablet TAKE 0.5 TABLETS (100 MG TOTAL) BY MOUTH EVERY MORNING. 02/04/23   Revankar, Micael Adas, MD  atorvastatin  (LIPITOR) 10 MG tablet Take 1 tablet (10 mg total) by mouth at bedtime. 03/29/17   Revankar, Rajan R, MD  bimatoprost (LUMIGAN) 0.01 % SOLN Place 1 drop into both eyes at bedtime.     [provider]  Cholecalciferol  (VITAMIN D ) 50 MCG (2000 UT) tablet Take 2,000 Units by mouth daily.    [provider]  clopidogrel  (PLAVIX ) 75 MG tablet Take 1 tablet (75 mg total) by mouth daily. 12/24/15   Ghimire, Estil Heman, MD  isosorbide  mononitrate (ISMO ) 10 MG tablet Take 10 mg by mouth 2 (two) times daily. 12/31/19   [provider]  loratadine  (CLARITIN ) 10 MG tablet Take 10 mg by mouth at bedtime.    [provider]  losartan -hydrochlorothiazide  (HYZAAR) 50-12.5 MG tablet Take 1 tablet by mouth daily. 07/15/20   [provider]  melatonin 5 MG TABS Take 5 mg by mouth at bedtime.    [provider]  Multiple Vitamins-Minerals (CENTRUM VITAMINTS PO) Take 1 tablet by mouth every morning.    [provider]  Multiple Vitamins-Minerals (ICAPS AREDS 2 PO) Take 1 capsule by mouth daily.    [provider]  Omega-3 Fatty Acids (FISH OIL ) 1000 MG CAPS Take 2 capsules (2,000 mg total) by mouth in the morning and at bedtime. 03/13/20   Revankar, Micael Adas, MD  ondansetron  (ZOFRAN ) 4 MG tablet Take 4 mg by mouth every 8 (eight) hours as needed for nausea/vomiting. 07/31/20   [provider]  pantoprazole  (PROTONIX ) 40 MG tablet Take 40 mg by mouth every  morning. 08/09/20   [provider]  Probiotic Product (PROBIOTIC PO) Take 1 capsule by mouth daily in the afternoon.    [provider]  sertraline  (ZOLOFT ) 50 MG tablet Take 50 mg by mouth daily.    [provider]  sucralfate  (CARAFATE ) 1 g tablet Take 1 g by mouth every morning. 08/08/20   [provider]  sucralfate  (CARAFATE ) 1 GM/10ML suspension Take 10 mLs by mouth at bedtime. 02/16/22   [provider]  vitamin B-12 (CYANOCOBALAMIN ) 1000 MCG tablet Take 1,000 mcg by mouth daily.    [provider]      Allergies    Lorazepam , Ativan  [lorazepam ], and Metronidazole    Review of Systems   Review of  Systems  Physical Exam Updated Vital Signs BP (!) 185/72   Pulse 72   Temp 98.1 F (36.7 C) (Oral)   Resp 16   SpO2 94%  Physical Exam Vitals and nursing note reviewed.  Constitutional:      General: She is not in acute distress.    Appearance: Normal appearance.  HENT:     Head: Normocephalic and atraumatic.     Nose: Nose normal.     Mouth/Throat:     Mouth: Mucous membranes are moist.     Pharynx: Oropharynx is clear.  Eyes:     Extraocular Movements: Extraocular movements intact.     Conjunctiva/sclera: Conjunctivae normal.  Neck:     Comments: No midline neck tenderness Cardiovascular:     Rate and Rhythm: Normal rate and regular rhythm.     Heart sounds: Normal heart sounds.  Pulmonary:     Effort: Pulmonary effort is normal.     Breath sounds: Normal breath sounds.  Abdominal:     General: Abdomen is flat.     Palpations: Abdomen is soft.     Tenderness: There is no abdominal tenderness.  Musculoskeletal:        General: Normal range of motion.     Cervical back: Normal range of motion and neck supple.     Comments: Tenderness to palpation of lower lumbar spine No tenderness to bilateral upper or right lower extremities, no pain with hip internal/external rotation bilaterally Tenderness to palpation of left anterior knee, no obvious deformity  Skin:    General: Skin is warm and dry.     Findings: Bruising (Left forearm) present.  Neurological:     General: No focal deficit present.     Mental Status: She is alert and oriented to person, place, and time.     Sensory: No sensory deficit.     Motor: No weakness (5 out of 5 strength in bilateral hip extension, plantar/dorsi flexion).  Psychiatric:        Mood and Affect: Mood normal.        Behavior: Behavior normal.     ED Results / Procedures / Treatments   Labs (all labs ordered are listed, but only abnormal results are displayed) Labs Reviewed - No data to display  EKG None  Radiology CT  Cervical Spine Wo Contrast Result Date: 05/28/2023 CLINICAL DATA:  Trauma, fall. EXAM: CT CERVICAL SPINE WITHOUT CONTRAST TECHNIQUE: Multidetector CT imaging of the cervical spine was performed without intravenous contrast. Multiplanar CT image reconstructions were also generated. RADIATION DOSE REDUCTION: This exam was performed according to the departmental dose-optimization program which includes automated exposure control, adjustment of the mA and/or kV according to patient size and/or use of iterative reconstruction technique. COMPARISON:  08/28/2020 FINDINGS: Alignment:  Slight reversal of the normal cervical lordosis. Similar 3 mm retrolisthesis of C5 on C6. Trace anterolisthesis of C3 on C4 and C7 on T1. Stable appearance of the facets without acute subluxation or dislocation. Skull base and vertebrae: No compression fracture or displaced fracture in the cervical spine. Diffuse osteopenia. Degenerative endplate sclerosis at C5-6. No suspicious or destructive osseous lesion appreciated. Soft tissues and spinal canal: No prevertebral fluid or swelling. No visible canal hematoma. Multiple thyroid  nodules measuring up to 1.2 cm on the current study. Disc levels: Moderate disc space narrowing at C5-6. Disc osteophyte complex at C5-6 resulting in moderate spinal canal stenosis. Facet arthrosis and uncovertebral hypertrophy at multiple levels. Foraminal narrowing is most pronounced at C5-6. Upper chest: Biapical pleuroparenchymal scarring and septal thickening again noted. Other: None. IMPRESSION: No acute fracture or traumatic malalignment of the cervical spine. Degenerative changes as above greatest at C5-6. Similar appearance of thyroid  nodules. Similar biapical pleuroparenchymal scarring and septal thickening in the lung apices. Electronically Signed   By: Denny Flack M.D.   On: 05/28/2023 14:50   CT Head Wo Contrast Result Date: 05/28/2023 CLINICAL DATA:  Head trauma, fall. EXAM: CT HEAD WITHOUT CONTRAST  TECHNIQUE: Contiguous axial images were obtained from the base of the skull through the vertex without intravenous contrast. RADIATION DOSE REDUCTION: This exam was performed according to the departmental dose-optimization program which includes automated exposure control, adjustment of the mA and/or kV according to patient size and/or use of iterative reconstruction technique. COMPARISON:  12/23/2015. FINDINGS: Brain: No acute intracranial hemorrhage. No CT evidence of acute infarct. Remote lacunar infarct in the right thalamus. Nonspecific hypoattenuation in the periventricular and subcortical white matter favored to reflect chronic microvascular ischemic changes. Mild parenchymal volume loss. No edema, mass effect, or midline shift. The basilar cisterns are patent. Ventricles: The ventricles are normal. Vascular: Atherosclerotic calcifications of the carotid siphons and intracranial vertebral arteries. No hyperdense vessel. Skull: No acute or aggressive finding. Orbits: Orbits are symmetric. Sinuses: The visualized paranasal sinuses are clear. Other: Mastoid air cells are clear. IMPRESSION: No CT evidence of acute intracranial abnormality. Mild chronic microvascular ischemic changes and mild parenchymal volume loss. Remote lacunar infarct in the right thalamus. Electronically Signed   By: Denny Flack M.D.   On: 05/28/2023 14:34    Procedures Procedures    Medications Ordered in ED Medications  lidocaine  (LIDODERM ) 5 % 1-3 patch (1 patch Transdermal Patch Applied 05/28/23 1405)  acetaminophen  (TYLENOL ) tablet 1,000 mg (1,000 mg Oral Given 05/28/23 1404)    ED Course/ Medical Decision Making/ A&P Clinical Course as of 05/28/23 1503  Sat May 28, 2023  1501 Patient Signed out to Dr. Delana Favors pending imaging reads, CTH/C-Spine negative. [VK]    Clinical Course User Index [VK] Kingsley, Dulcinea Kinser K, DO                                 Medical Decision Making This patient presents to the ED with chief  complaint(s) of fall with pertinent past medical history of A-fib not on [place], CAD on Plavix , hypertension which further complicates the presenting complaint. The complaint involves an extensive differential diagnosis and also carries with it a high risk of complications and morbidity.    The differential diagnosis includes due to patient's age and fall concern for ICH, mass effect, cervical spine fracture, lumbar fracture, knee fracture, contusion, no other traumatic injury seen on exam, no presyncopal symptoms making syncopal  fall unlikely  Additional history obtained: Additional history obtained from family Records reviewed Primary Care Documents  ED Course and Reassessment: On patient's arrival she is hemodynamically stable in no acute distress without any neurologic deficits.  Due to her age and fall will have CT head, C-spine and lumbar spine as well as pelvis and left knee x-ray.  She will be given Tylenol  and lidocaine  patch for pain control and will be closely reassessed.  Independent labs interpretation:  N/A  Independent visualization of imaging: - I independently visualized the following imaging with scope of interpretation limited to determining acute life threatening conditions related to emergency care: CTH/C-spine, which revealed no acute disease; remainder of imaging pending    Amount and/or Complexity of Data Reviewed Radiology: ordered.  Risk OTC drugs. Prescription drug management.          Final Clinical Impression(s) / ED Diagnoses Final diagnoses:  None    Rx / DC Orders ED Discharge Orders     None         Kingsley, Zakaria Sedor K, DO 05/28/23 1503

## 2023-05-28 NOTE — ED Notes (Signed)
 Pt assisted into gown, fall risk arm band in place, gripper socks in place and fall risk signage placed on door

## 2023-05-28 NOTE — Discharge Instructions (Signed)
 As we discussed, you had no fractures on your x-ray or CT scan  You may be sore and please take Tylenol  as needed for pain  I have prescribed lidocaine  patch as needed for severe pain  See your doctor for follow-up  Return to ER if you have another fall or vomiting or trouble walking

## 2023-05-28 NOTE — ED Triage Notes (Signed)
 Pt had mechanical fall. When daughter found her, she was leaning against a couch with her back (in the floor). Lower back is hurting.   Pt is on plavix .no loc,did not hit her head

## 2023-05-28 NOTE — ED Provider Notes (Signed)
  Physical Exam  BP (!) 185/72   Pulse 72   Temp 98.1 F (36.7 C) (Oral)   Resp 16   SpO2 94%   Physical Exam  Procedures  Procedures  ED Course / MDM   Clinical Course as of 05/28/23 1602  Sat May 28, 2023  1501 Patient Signed out to Dr. Delana Favors pending imaging reads, CTH/C-Spine negative. [VK]    Clinical Course User Index [VK] Kingsley, Victoria K, DO   Medical Decision Making Care assumed at 2 PM.  Patient is here after a fall.  Signout pending CT and x-ray results.  4:03 PM CT head and cervical spine and CT lumbar spine did not show any fracture.  X-rays unremarkable.  Patient has been ambulating in the ER with assistance.  Will prescribe lidocaine  patch and recommend Tylenol  as needed  Problems Addressed: Fall, initial encounter: acute illness or injury  Amount and/or Complexity of Data Reviewed Radiology: ordered and independent interpretation performed. Decision-making details documented in ED Course.  Risk OTC drugs. Prescription drug management.          Dalene Duck, MD 05/28/23 [phone]

## 2023-07-18 ENCOUNTER — Emergency Department (HOSPITAL_COMMUNITY)

## 2023-07-18 ENCOUNTER — Emergency Department (HOSPITAL_COMMUNITY)
Admission: EM | Admit: 2023-07-18 | Discharge: 2023-07-18 | Disposition: A | Discharge status: Home / Self Care | Attending: Emergency Medicine | Admitting: Emergency Medicine

## 2023-07-18 DIAGNOSIS — W19XXXA Unspecified fall, initial encounter: Secondary | ICD-10-CM | POA: Insufficient documentation

## 2023-07-18 DIAGNOSIS — S32010A Wedge compression fracture of first lumbar vertebra, initial encounter for closed fracture: Secondary | ICD-10-CM

## 2023-07-18 DIAGNOSIS — E119 Type 2 diabetes mellitus without complications: Secondary | ICD-10-CM | POA: Diagnosis not present

## 2023-07-18 DIAGNOSIS — S3992XA Unspecified injury of lower back, initial encounter: Secondary | ICD-10-CM | POA: Diagnosis present

## 2023-07-18 DIAGNOSIS — Y92009 Unspecified place in unspecified non-institutional (private) residence as the place of occurrence of the external cause: Secondary | ICD-10-CM | POA: Insufficient documentation

## 2023-07-18 DIAGNOSIS — R300 Dysuria: Secondary | ICD-10-CM | POA: Insufficient documentation

## 2023-07-18 DIAGNOSIS — S32018A Other fracture of first lumbar vertebra, initial encounter for closed fracture: Secondary | ICD-10-CM | POA: Insufficient documentation

## 2023-07-18 DIAGNOSIS — S0990XA Unspecified injury of head, initial encounter: Secondary | ICD-10-CM | POA: Diagnosis not present

## 2023-07-18 LAB — I-STAT CHEM 8, ED
BUN: 17 mg/dL (ref 8–23)
Calcium, Ion: 1.13 mmol/L — ABNORMAL LOW (ref 1.15–1.40)
Chloride: 98 mmol/L (ref 98–111)
Creatinine, Ser: 1.4 mg/dL — ABNORMAL HIGH (ref 0.44–1.00)
Glucose, Bld: 103 mg/dL — ABNORMAL HIGH (ref 70–99)
HCT: 33 % — ABNORMAL LOW (ref 36.0–46.0)
Hemoglobin: 11.2 g/dL — ABNORMAL LOW (ref 12.0–15.0)
Potassium: 4.2 mmol/L (ref 3.5–5.1)
Sodium: 133 mmol/L — ABNORMAL LOW (ref 135–145)
TCO2: 23 mmol/L (ref 22–32)

## 2023-07-18 LAB — URINALYSIS, ROUTINE W REFLEX MICROSCOPIC
Bilirubin Urine: NEGATIVE
Glucose, UA: NEGATIVE mg/dL
Hgb urine dipstick: NEGATIVE
Ketones, ur: NEGATIVE mg/dL
Nitrite: NEGATIVE
Protein, ur: NEGATIVE mg/dL
Specific Gravity, Urine: 1.01 (ref 1.005–1.030)
pH: 6 (ref 5.0–8.0)

## 2023-07-18 LAB — BASIC METABOLIC PANEL WITH GFR
Anion gap: 10 (ref 5–15)
BUN: 16 mg/dL (ref 8–23)
CO2: 24 mmol/L (ref 22–32)
Calcium: 8.9 mg/dL (ref 8.9–10.3)
Chloride: 97 mmol/L — ABNORMAL LOW (ref 98–111)
Creatinine, Ser: 1.39 mg/dL — ABNORMAL HIGH (ref 0.44–1.00)
GFR, Estimated: 34 mL/min — ABNORMAL LOW (ref >60)
Glucose, Bld: 104 mg/dL — ABNORMAL HIGH (ref 70–99)
Potassium: 4.3 mmol/L (ref 3.5–5.1)
Sodium: 131 mmol/L — ABNORMAL LOW (ref 135–145)

## 2023-07-18 LAB — CBC
HCT: 35.1 % — ABNORMAL LOW (ref 36.0–46.0)
Hemoglobin: 11.1 g/dL — ABNORMAL LOW (ref 12.0–15.0)
MCH: 29.2 pg (ref 26.0–34.0)
MCHC: 31.6 g/dL (ref 30.0–36.0)
MCV: 92.4 fL (ref 80.0–100.0)
Platelets: 157 10*3/uL (ref 150–400)
RBC: 3.8 MIL/uL — ABNORMAL LOW (ref 3.87–5.11)
RDW: 14.8 % (ref 11.5–15.5)
WBC: 8.2 10*3/uL (ref 4.0–10.5)
nRBC: 0 % (ref 0.0–0.2)

## 2023-07-18 LAB — I-STAT CG4 LACTIC ACID, ED: Lactic Acid, Venous: 1.7 mmol/L (ref 0.5–1.9)

## 2023-07-18 MED ORDER — ACETAMINOPHEN 325 MG PO TABS
650.0000 mg | ORAL_TABLET | Freq: Once | ORAL | Status: AC
  Administered 2023-07-18: 650 mg via ORAL
  Filled 2023-07-18: qty 2

## 2023-07-18 NOTE — Progress Notes (Signed)
 Orthopedic Tech Progress Note Patient Details:  Sheri Hensley February 09, 1919 [id number]  Patient ID: [name], female   DOB: February 24, 1919, 89 y.o.   MRN: [id number] I attended trauma page. Chandra Dorn PARAS 07/18/2023, 6:48 AM

## 2023-07-18 NOTE — ED Triage Notes (Signed)
 Patient BIB [facility] for evaluation after a fall at home. Patient reports she was reaching for a brief after using the bathroom and fell sideways onto the floor. Patient reports she did not hit her head. No LOC. Patient alert and oriented on arrival. Patient reports some pain in the lower right side of her back. EMS reports patient has been treated for UTI for the past week and according to family patient is still having some confusion.

## 2023-07-18 NOTE — Progress Notes (Signed)
 Orthopedic Tech Progress Note Patient Details:  Sheri Hensley 1919/09/20 [id number]  Spoke with PA about patient being admitted or discharged and she is being discharged, so I called in stat order to [facility] for a LSO BACK BRACE  Patient ID: Sheri Hensley, female   DOB: December 21, 1919, 89 y.o.   MRN: [id number]  [name] Pac 07/18/2023, 10:47 AM

## 2023-07-18 NOTE — ED Notes (Signed)
 Orthopedic vendor applied brace, pt and family educated on use.

## 2023-07-18 NOTE — Discharge Instructions (Addendum)
 Results of UA: Color, Urine YELLOW Appearance CLEAR Specific Gravity, Urine 1.010 pH 6.0 Glucose, UA NEGATIVE Hgb urine dipstick NEGATIVE Bilirubin Urine NEGATIVE Ketones, ur NEGATIVE Protein NEGATIVE Nitrite NEGATIVE Leukocytes,Ua SMALL RBC / HPF 0-5 WBC, UA 11-20 Bacteria, UA RARE Squamous Epithelial / HPF 0-5   Again, as we discussed, some scattered white blood cells and few bacteria with negative nitrites does not appear to be an inadequately treated UTI, especially with no white blood cell count.   Her BP in the ED was 168/62. While this is elevated, it is okay for the emergency department. She can follow up with primary care physician if this level remains elevated to discuss blood pressure medication adjustments.

## 2023-07-18 NOTE — ED Provider Notes (Signed)
 [place] EMERGENCY DEPARTMENT AT [facility] Provider Note   CSN: [id number] Arrival date & time: 07/18/23  9361     Patient presents with: [place]   Sheri Hensley is a 89 y.o. female with past medical history significant for current Plavix  use secondary to history of TIA, diabetes,, paroxysmal A-fib who presents with concern for on thinners mechanical fall while she was trying to get something out of her closet this morning.  Denies head injury, loss of consciousness.  Reports some mild low back pain.  Rates her pain 5/10.  Nothing for pain prior to arrival.   HPI     Prior to Admission medications   Medication Sig Start Date End Date Taking? Authorizing Provider  acetaminophen  (TYLENOL ) 325 MG tablet Take 2 tablets (650 mg total) by mouth every 6 (six) hours as needed for mild pain or headache (fever >/= 101). 01/18/19   Elgergawy, Brayton RAMAN, MD  ALPHAGAN  P 0.1 % SOLN Apply 1 drop to eye 2 (two) times daily. 04/02/20   [provider]  amiodarone  (PACERONE ) 200 MG tablet TAKE 0.5 TABLETS (100 MG TOTAL) BY MOUTH EVERY MORNING. 02/04/23   Revankar, Jennifer SAUNDERS, MD  atorvastatin  (LIPITOR) 10 MG tablet Take 1 tablet (10 mg total) by mouth at bedtime. 03/29/17   Revankar, Rajan R, MD  bimatoprost (LUMIGAN) 0.01 % SOLN Place 1 drop into both eyes at bedtime.    [provider]  Cholecalciferol  (VITAMIN D ) 50 MCG (2000 UT) tablet Take 2,000 Units by mouth daily.    [provider]  clopidogrel  (PLAVIX ) 75 MG tablet Take 1 tablet (75 mg total) by mouth daily. 12/24/15   Ghimire, Donalda HERO, MD  isosorbide  mononitrate (ISMO ) 10 MG tablet Take 10 mg by mouth 2 (two) times daily. 12/31/19   [provider]  lidocaine  (LIDODERM ) 5 % Place 1 patch onto the skin daily. Remove & Discard patch within 12 hours or as directed by MD 05/28/23   Patt Alm Macho, MD  loratadine  (CLARITIN ) 10 MG tablet Take 10 mg by mouth at bedtime.    [provider]   losartan -hydrochlorothiazide  (HYZAAR) 50-12.5 MG tablet Take 1 tablet by mouth daily. 07/15/20   [provider]  melatonin 5 MG TABS Take 5 mg by mouth at bedtime.    [provider]  Multiple Vitamins-Minerals (CENTRUM VITAMINTS PO) Take 1 tablet by mouth every morning.    [provider]  Multiple Vitamins-Minerals (ICAPS AREDS 2 PO) Take 1 capsule by mouth daily.    [provider]  Omega-3 Fatty Acids (FISH OIL ) 1000 MG CAPS Take 2 capsules (2,000 mg total) by mouth in the morning and at bedtime. 03/13/20   Revankar, Jennifer SAUNDERS, MD  ondansetron  (ZOFRAN ) 4 MG tablet Take 4 mg by mouth every 8 (eight) hours as needed for nausea/vomiting. 07/31/20   [provider]  pantoprazole  (PROTONIX ) 40 MG tablet Take 40 mg by mouth every morning. 08/09/20   [provider]  Probiotic Product (PROBIOTIC PO) Take 1 capsule by mouth daily in the afternoon.    [provider]  sertraline  (ZOLOFT ) 50 MG tablet Take 50 mg by mouth daily.    [provider]  sucralfate  (CARAFATE ) 1 g tablet Take 1 g by mouth every morning. 08/08/20   [provider]  sucralfate  (CARAFATE ) 1 GM/10ML suspension Take 10 mLs by mouth at bedtime. 02/16/22   [provider]  vitamin B-12 (CYANOCOBALAMIN ) 1000 MCG tablet Take 1,000 mcg by mouth  daily.    [provider]    Allergies: Lorazepam , Ativan  [lorazepam ], and Metronidazole    Review of Systems  All other systems reviewed and are negative.   Updated Vital Signs BP (!) 168/62 (BP Location: Left Arm)   Pulse 73   Temp 98.9 F (37.2 C) (Oral)   Resp 16   Ht 5' 3 (1.6 m)   Wt 60 kg   SpO2 100%   BMI 23.43 kg/m   Physical Exam Vitals and nursing note reviewed.  Constitutional:      General: She is not in acute distress.    Appearance: Normal appearance.  HENT:     Head: Normocephalic and atraumatic.   Eyes:     General:        Right eye: No discharge.        Left eye:  No discharge.    Cardiovascular:     Rate and Rhythm: Normal rate and regular rhythm.     Heart sounds: No murmur heard.    No friction rub. No gallop.  Pulmonary:     Effort: Pulmonary effort is normal.     Breath sounds: Normal breath sounds.  Abdominal:     General: Bowel sounds are normal.     Palpations: Abdomen is soft.   Musculoskeletal:     Comments: Intact strength 5/5 bilateral upper and lower extremities  Able to ambulate with some assistance which is her baseline.   Skin:    General: Skin is warm and dry.     Capillary Refill: Capillary refill takes less than 2 seconds.   Neurological:     Mental Status: She is alert and oriented to person, place, and time.   Psychiatric:        Mood and Affect: Mood normal.        Behavior: Behavior normal.     (all labs ordered are listed, but only abnormal results are displayed) Labs Reviewed  URINALYSIS, ROUTINE W REFLEX MICROSCOPIC - Abnormal; Notable for the following components:      Result Value   Leukocytes,Ua SMALL (*)    Bacteria, UA RARE (*)    All other components within normal limits  BASIC METABOLIC PANEL WITH GFR - Abnormal; Notable for the following components:   Sodium 131 (*)    Chloride 97 (*)    Glucose, Bld 104 (*)    Creatinine, Ser 1.39 (*)    GFR, Estimated 34 (*)    All other components within normal limits  CBC - Abnormal; Notable for the following components:   RBC 3.80 (*)    Hemoglobin 11.1 (*)    HCT 35.1 (*)    All other components within normal limits  I-STAT CHEM 8, ED - Abnormal; Notable for the following components:   Sodium 133 (*)    Creatinine, Ser 1.40 (*)    Glucose, Bld 103 (*)    Calcium , Ion 1.13 (*)    Hemoglobin 11.2 (*)    HCT 33.0 (*)    All other components within normal limits  URINE CULTURE  I-STAT CG4 LACTIC ACID, ED    EKG: None  Radiology: CT Lumbar Spine Wo Contrast Result Date: 07/18/2023 CLINICAL DATA:  Fall.  Pain. EXAM: CT LUMBAR SPINE WITHOUT  CONTRAST TECHNIQUE: Multidetector CT imaging of the lumbar spine was performed without intravenous contrast administration. Multiplanar CT image reconstructions were also generated. RADIATION DOSE REDUCTION: This exam was performed according to the departmental dose-optimization program which includes automated exposure control, adjustment  of the mA and/or kV according to patient size and/or use of iterative reconstruction technique. COMPARISON:  05/28/2023 FINDINGS: Segmentation: 5 lumbar type vertebrae. Alignment: S-shaped thoracolumbar scoliosis evident with apex of the leftward lumbar scoliosis at the L4-5 level. Trace anterolisthesis of L4 on 5 evident. Vertebrae: Superior endplate compression deformity at L1 is new since 05/28/2023 resulting in 25% or less loss of height anteriorly and posteriorly. No substantial retropulsion of bony fragments into the spinal canal. Paraspinal and other soft tissues: No substantial paravertebral hemorrhage. Diffuse diverticular disease noted within the visualized colon atherosclerotic calcification noted in the wall of the abdominal aorta. Disc levels: Mild multifactorial central canal stenosis noted L2-3 with moderate multifactorial central canal stenosis at L3-4 and L4-5. IMPRESSION: 1. Superior endplate compression deformity at L1 is new since 05/28/2023 resulting in 25% or less loss of height anteriorly and posteriorly. No substantial retropulsion of bony fragments into the spinal canal. 2. S-shaped thoracolumbar scoliosis with apex of the leftward lumbar scoliosis at the L4-5 level. 3. Multifactorial central canal stenosis at L2-3, L3-4 and L4-5. 4.  Aortic Atherosclerosis (ICD10-I70.0). Electronically Signed   By: Camellia Candle M.D.   On: 07/18/2023 07:59   CT Head Wo Contrast Result Date: 07/18/2023 CLINICAL DATA:  Status post fall.  Neck trauma. EXAM: CT HEAD WITHOUT CONTRAST CT CERVICAL SPINE WITHOUT CONTRAST TECHNIQUE: Multidetector CT imaging of the head and  cervical spine was performed following the standard protocol without intravenous contrast. Multiplanar CT image reconstructions of the cervical spine were also generated. RADIATION DOSE REDUCTION: This exam was performed according to the departmental dose-optimization program which includes automated exposure control, adjustment of the mA and/or kV according to patient size and/or use of iterative reconstruction technique. COMPARISON:  05/28/2023 FINDINGS: CT HEAD FINDINGS Brain: There is no evidence for acute hemorrhage, hydrocephalus, mass lesion, or abnormal extra-axial fluid collection. No definite CT evidence for acute infarction. Diffuse loss of parenchymal volume is consistent with atrophy. Patchy low attenuation in the deep hemispheric and periventricular white matter is nonspecific, but likely reflects chronic microvascular ischemic demyelination. Vascular: No hyperdense vessel or unexpected calcification. Skull: No evidence for fracture. No worrisome lytic or sclerotic lesion. Sinuses/Orbits: The visualized paranasal sinuses and mastoid air cells are clear. Visualized portions of the globes and intraorbital fat are unremarkable. Other: None. CT CERVICAL SPINE FINDINGS Alignment: Straightening of normal cervical lordosis. Degenerative retrolisthesis of C5 on 6 is stable. Skull base and vertebrae: No acute fracture. No primary bone lesion or focal pathologic process. Soft tissues and spinal canal: No prevertebral fluid or swelling. No visible canal hematoma. Disc levels: Loss of disc height most prominent at C5-6, stable in the interval. Diffuse facet degeneration noted bilaterally with fusion of the right-sided C2-3 facets and left-sided C4-5 facets. Upper chest: Pleuroparenchymal scarring noted in the lung apices. Other: None. IMPRESSION: 1. No acute intracranial abnormality. 2. Atrophy with chronic small vessel white matter ischemic disease. 3. Degenerative changes in the cervical spine without acute bony  abnormality. Electronically Signed   By: Camellia Candle M.D.   On: 07/18/2023 07:51   CT Cervical Spine Wo Contrast Result Date: 07/18/2023 CLINICAL DATA:  Status post fall.  Neck trauma. EXAM: CT HEAD WITHOUT CONTRAST CT CERVICAL SPINE WITHOUT CONTRAST TECHNIQUE: Multidetector CT imaging of the head and cervical spine was performed following the standard protocol without intravenous contrast. Multiplanar CT image reconstructions of the cervical spine were also generated. RADIATION DOSE REDUCTION: This exam was performed according to the departmental dose-optimization program which includes automated  exposure control, adjustment of the mA and/or kV according to patient size and/or use of iterative reconstruction technique. COMPARISON:  05/28/2023 FINDINGS: CT HEAD FINDINGS Brain: There is no evidence for acute hemorrhage, hydrocephalus, mass lesion, or abnormal extra-axial fluid collection. No definite CT evidence for acute infarction. Diffuse loss of parenchymal volume is consistent with atrophy. Patchy low attenuation in the deep hemispheric and periventricular white matter is nonspecific, but likely reflects chronic microvascular ischemic demyelination. Vascular: No hyperdense vessel or unexpected calcification. Skull: No evidence for fracture. No worrisome lytic or sclerotic lesion. Sinuses/Orbits: The visualized paranasal sinuses and mastoid air cells are clear. Visualized portions of the globes and intraorbital fat are unremarkable. Other: None. CT CERVICAL SPINE FINDINGS Alignment: Straightening of normal cervical lordosis. Degenerative retrolisthesis of C5 on 6 is stable. Skull base and vertebrae: No acute fracture. No primary bone lesion or focal pathologic process. Soft tissues and spinal canal: No prevertebral fluid or swelling. No visible canal hematoma. Disc levels: Loss of disc height most prominent at C5-6, stable in the interval. Diffuse facet degeneration noted bilaterally with fusion of the  right-sided C2-3 facets and left-sided C4-5 facets. Upper chest: Pleuroparenchymal scarring noted in the lung apices. Other: None. IMPRESSION: 1. No acute intracranial abnormality. 2. Atrophy with chronic small vessel white matter ischemic disease. 3. Degenerative changes in the cervical spine without acute bony abnormality. Electronically Signed   By: Camellia Candle M.D.   On: 07/18/2023 07:51   DG Chest 1 View Result Date: 07/18/2023 CLINICAL DATA:  89 year old female status post fall at home. EXAM: CHEST  1 VIEW COMPARISON:  Portable chest 08/28/2020 and earlier. FINDINGS: AP upright view at 0707 hours. Lower lung volumes. Cardiomegaly and tortuosity of the thoracic aorta appears stable. Calcified aortic atherosclerosis. Other mediastinal contours are within normal limits. Visualized tracheal air column is within normal limits. Pulmonary vascular congestion, no overt edema. No pneumothorax, pleural effusion or consolidation. Chronic left axillary surgical clips. No acute osseous abnormality identified. Paucity of bowel gas the visible abdomen. IMPRESSION: Lower lung volumes with cardiomegaly and pulmonary vascular congestion. No overt edema.  No acute traumatic injury identified. Electronically Signed   By: VEAR Hurst M.D.   On: 07/18/2023 07:15     Procedures   Medications Ordered in the ED  acetaminophen  (TYLENOL ) tablet 650 mg (650 mg Oral Given 07/18/23 0650)                                    Medical Decision Making  This patient is a 89 y.o. female  who presents to the ED for concern of fall, head vs low back injury after unwitnessed fall.   Differential diagnoses prior to evaluation: The emergent differential diagnosis includes, but is not limited to, acute head, cervical spine, lumbar spine injury including compression fracture, new dislocation, versus other.. This is not an exhaustive differential.   Past Medical History / Co-morbidities / Social History: TIA, diabetes, paroxysmal  A-fib  Additional history: Chart reviewed. Pertinent results include: Reports some recent urinary tract infections  Physical Exam: Physical exam performed. The pertinent findings include: Intact strength 5/5 bilateral upper and lower extremities  Able to ambulate with some assistance which is her baseline.    Lab Tests/Imaging studies: I personally interpreted labs/imaging and the pertinent results include: CBC with mild anemia, hemoglobin 11.1, white blood cell count at 8.2 is unremarkable, UA with small leukocytes, few white blood cells and rare  bacteria, not overtly with appearance of acute urinary tract infection.  Suspect that the Bactrim that she is currently taking is appropriate for UTI for which she has been treated, low clinical suspicion that patient is requiring IV antibiotics at this time.  Recommend close PCP follow-up. I agree with the radiologist interpretation.  Mild hyponatremia, sodium 131, creatinine at 1.39 slightly worse than last baseline 2 years ago but not significantly abnormal, normal BUN.  CT head, C-spine, and plain film chest x-ray without any acute abnormality, endplate compression deformity noted at L1 with 25% loss of disc height, will plan for LSO brace, orthopedic follow-up, PT as tolerated, but she can ambulate without significant difficulty in the emergency department.   Medications: I ordered medication including tylenol  for pain.  I have reviewed the patients home medicines and have made adjustments as needed.   Disposition: After consideration of the diagnostic results and the patients response to treatment, I feel that patient does have CT which shows.   emergency department workup does not suggest an emergent condition requiring admission or immediate intervention beyond what has been performed at this time. The plan is: as above. The patient is safe for discharge and has been instructed to return immediately for worsening symptoms, change in symptoms or  any other concerns.   Final diagnoses:  Fall, initial encounter  Compression fracture of L1 vertebra, initial encounter [facility])    ED Discharge Orders     None          Rosan Sherlean DEL, PA-C 07/18/23 1100    Haze Lonni PARAS, MD 07/19/23 [phone]

## 2023-07-19 LAB — URINE CULTURE: Culture: <10000 — AB

## 2023-08-14 ENCOUNTER — Emergency Department (HOSPITAL_BASED_OUTPATIENT_CLINIC_OR_DEPARTMENT_OTHER)
Admission: EM | Admit: 2023-08-14 | Discharge: 2023-08-14 | Disposition: A | Discharge status: Home / Self Care | Attending: Emergency Medicine | Admitting: Emergency Medicine

## 2023-08-14 ENCOUNTER — Encounter (HOSPITAL_BASED_OUTPATIENT_CLINIC_OR_DEPARTMENT_OTHER): Payer: Self-pay | Admitting: Emergency Medicine

## 2023-08-14 ENCOUNTER — Other Ambulatory Visit: Payer: Self-pay

## 2023-08-14 DIAGNOSIS — Z853 Personal history of malignant neoplasm of breast: Secondary | ICD-10-CM | POA: Diagnosis not present

## 2023-08-14 DIAGNOSIS — Z79899 Other long term (current) drug therapy: Secondary | ICD-10-CM | POA: Insufficient documentation

## 2023-08-14 DIAGNOSIS — Z7902 Long term (current) use of antithrombotics/antiplatelets: Secondary | ICD-10-CM | POA: Diagnosis not present

## 2023-08-14 DIAGNOSIS — I2510 Atherosclerotic heart disease of native coronary artery without angina pectoris: Secondary | ICD-10-CM | POA: Insufficient documentation

## 2023-08-14 DIAGNOSIS — I10 Essential (primary) hypertension: Secondary | ICD-10-CM | POA: Diagnosis not present

## 2023-08-14 DIAGNOSIS — R300 Dysuria: Secondary | ICD-10-CM | POA: Diagnosis present

## 2023-08-14 DIAGNOSIS — N3000 Acute cystitis without hematuria: Secondary | ICD-10-CM | POA: Diagnosis not present

## 2023-08-14 LAB — COMPREHENSIVE METABOLIC PANEL WITH GFR
ALT: 17 U/L (ref 0–44)
AST: 24 U/L (ref 15–41)
Albumin: 4.2 g/dL (ref 3.5–5.0)
Alkaline Phosphatase: 164 U/L — ABNORMAL HIGH (ref 38–126)
Anion gap: 12 (ref 5–15)
BUN: 19 mg/dL (ref 8–23)
CO2: 24 mmol/L (ref 22–32)
Calcium: 9.5 mg/dL (ref 8.9–10.3)
Chloride: 101 mmol/L (ref 98–111)
Creatinine, Ser: 1.39 mg/dL — ABNORMAL HIGH (ref 0.44–1.00)
GFR, Estimated: 33 mL/min — ABNORMAL LOW (ref >60)
Glucose, Bld: 107 mg/dL — ABNORMAL HIGH (ref 70–99)
Potassium: 4.3 mmol/L (ref 3.5–5.1)
Sodium: 138 mmol/L (ref 135–145)
Total Bilirubin: 0.7 mg/dL (ref 0.0–1.2)
Total Protein: 6.9 g/dL (ref 6.5–8.1)

## 2023-08-14 LAB — URINE DRUG SCREEN
Amphetamines: NOT DETECTED
Barbiturates: NOT DETECTED
Benzodiazepines: NOT DETECTED
Cocaine: NOT DETECTED
Fentanyl: NOT DETECTED
Methadone Scn, Ur: NOT DETECTED
Opiates: NOT DETECTED
Tetrahydrocannabinol: NOT DETECTED

## 2023-08-14 LAB — CBC WITH DIFFERENTIAL/PLATELET
Abs Immature Granulocytes: 0.05 K/uL (ref 0.00–0.07)
Basophils Absolute: 0 K/uL (ref 0.0–0.1)
Basophils Relative: 0 %
Eosinophils Absolute: 0 K/uL (ref 0.0–0.5)
Eosinophils Relative: 0 %
HCT: 32.7 % — ABNORMAL LOW (ref 36.0–46.0)
Hemoglobin: 10.5 g/dL — ABNORMAL LOW (ref 12.0–15.0)
Immature Granulocytes: 1 %
Lymphocytes Relative: 7 %
Lymphs Abs: 0.7 K/uL (ref 0.7–4.0)
MCH: 29.1 pg (ref 26.0–34.0)
MCHC: 32.1 g/dL (ref 30.0–36.0)
MCV: 90.6 fL (ref 80.0–100.0)
Monocytes Absolute: 0.6 K/uL (ref 0.1–1.0)
Monocytes Relative: 6 %
Neutro Abs: 8.6 K/uL — ABNORMAL HIGH (ref 1.7–7.7)
Neutrophils Relative %: 86 %
Platelets: 149 K/uL — ABNORMAL LOW (ref 150–400)
RBC: 3.61 MIL/uL — ABNORMAL LOW (ref 3.87–5.11)
RDW: 15.2 % (ref 11.5–15.5)
WBC: 10.1 K/uL (ref 4.0–10.5)
nRBC: 0 % (ref 0.0–0.2)

## 2023-08-14 LAB — URINALYSIS, ROUTINE W REFLEX MICROSCOPIC
Bacteria, UA: NONE SEEN
Bilirubin Urine: NEGATIVE
Glucose, UA: NEGATIVE mg/dL
Ketones, ur: NEGATIVE mg/dL
Nitrite: NEGATIVE
Protein, ur: 100 mg/dL — AB
Specific Gravity, Urine: 1.013 (ref 1.005–1.030)
WBC, UA: >50 WBC/hpf (ref 0–5)
pH: 6.5 (ref 5.0–8.0)

## 2023-08-14 LAB — LIPASE, BLOOD: Lipase: 23 U/L (ref 11–51)

## 2023-08-14 MED ORDER — SODIUM CHLORIDE 0.9 % IV SOLN: INTRAVENOUS | Status: DC

## 2023-08-14 MED ORDER — SODIUM CHLORIDE 0.9 % IV BOLUS
500.0000 mL | Freq: Once | INTRAVENOUS | Status: AC
  Administered 2023-08-14: 500 mL via INTRAVENOUS

## 2023-08-14 MED ORDER — CIPROFLOXACIN HCL 500 MG PO TABS: 500.0000 mg | ORAL_TABLET | Freq: Two times a day (BID) | ORAL | 0 refills | Status: DC

## 2023-08-14 MED ORDER — CIPROFLOXACIN IN D5W 400 MG/200ML IV SOLN
400.0000 mg | Freq: Once | INTRAVENOUS | Status: AC
  Administered 2023-08-14: 400 mg via INTRAVENOUS
  Filled 2023-08-14: qty 200

## 2023-08-14 NOTE — ED Notes (Signed)
 DC paperwork given and verbally understood.

## 2023-08-14 NOTE — ED Provider Notes (Addendum)
  EMERGENCY DEPARTMENT AT [facility] Provider Note   CSN: [id number] Arrival date & time: 08/14/23  1437     Patient presents with: Dysuria   Sheri Hensley is a 89 y.o. female.   Patient with a history of frequent and recurrent urinary tract infections.  Is been off Cipro  now for 2 weeks.  But still on Septra low-dose suppression.  Patient seen by Margarete earlier this week had urinalysis that appeared abnormal with they did not send for culture best we can tell.  Patient complaining of hurting to urinate being a little bit more confused a little bit of mild hallucinations lives on her own.  But she does have 24-hour caregivers there at all times.  Had some diarrhea during the night apparently no blood.  Gave Imodium this morning no diarrhea since.  No history of any vomiting.  Patient has not been seen by urology.  Some of her frequency does raise a little bit of concern may be for interstitial cystitis not clear whether bacteria is completely involved every time.  Patient's vital signs are reassuring temp 99.6 pulse 85 respirations 18 blood pressure 115/59 oxygen sats are 96% on room air.  Past medical history significant for narrow complex tachycardia patient is on Pacerone .  Hypertension history of breast cancer macular degeneration history of C. difficile 5 to 6 years ago.  Coronary artery disease endometrial cancer acute kidney injury past surgical history significant for right total hip replacement appendectomy mastectomy.  Patient is never used tobacco products.       Prior to Admission medications   Medication Sig Start Date End Date Taking? Authorizing Provider  acetaminophen  (TYLENOL ) 325 MG tablet Take 2 tablets (650 mg total) by mouth every 6 (six) hours as needed for mild pain or headache (fever >/= 101). 01/18/19   Elgergawy, Brayton RAMAN, MD  ALPHAGAN  P 0.1 % SOLN Apply 1 drop to eye 2 (two) times daily. 04/02/20   [provider]  amiodarone   (PACERONE ) 200 MG tablet TAKE 0.5 TABLETS (100 MG TOTAL) BY MOUTH EVERY MORNING. 02/04/23   Revankar, Jennifer SAUNDERS, MD  atorvastatin  (LIPITOR) 10 MG tablet Take 1 tablet (10 mg total) by mouth at bedtime. 03/29/17   Revankar, Rajan R, MD  bimatoprost (LUMIGAN) 0.01 % SOLN Place 1 drop into both eyes at bedtime.    [provider]  Cholecalciferol  (VITAMIN D ) 50 MCG (2000 UT) tablet Take 2,000 Units by mouth daily.    [provider]  clopidogrel  (PLAVIX ) 75 MG tablet Take 1 tablet (75 mg total) by mouth daily. 12/24/15   Ghimire, Donalda HERO, MD  isosorbide  mononitrate (ISMO ) 10 MG tablet Take 10 mg by mouth 2 (two) times daily. 12/31/19   [provider]  lidocaine  (LIDODERM ) 5 % Place 1 patch onto the skin daily. Remove & Discard patch within 12 hours or as directed by MD 05/28/23   Patt Alm Macho, MD  loratadine  (CLARITIN ) 10 MG tablet Take 10 mg by mouth at bedtime.    [provider]  losartan -hydrochlorothiazide  (HYZAAR) 50-12.5 MG tablet Take 1 tablet by mouth daily. 07/15/20   [provider]  melatonin 5 MG TABS Take 5 mg by mouth at bedtime.    [provider]  Multiple Vitamins-Minerals (CENTRUM VITAMINTS PO) Take 1 tablet by mouth every morning.    [provider]  Multiple Vitamins-Minerals (ICAPS AREDS 2 PO) Take 1 capsule by mouth daily.    [provider]  Omega-3 Fatty Acids (FISH  OIL) 1000 MG CAPS Take 2 capsules (2,000 mg total) by mouth in the morning and at bedtime. 03/13/20   Revankar, Jennifer SAUNDERS, MD  ondansetron  (ZOFRAN ) 4 MG tablet Take 4 mg by mouth every 8 (eight) hours as needed for nausea/vomiting. 07/31/20   [provider]  pantoprazole  (PROTONIX ) 40 MG tablet Take 40 mg by mouth every morning. 08/09/20   [provider]  Probiotic Product (PROBIOTIC PO) Take 1 capsule by mouth daily in the afternoon.    [provider]  sertraline  (ZOLOFT ) 50 MG tablet Take 50 mg by mouth daily.     [provider]  sucralfate  (CARAFATE ) 1 g tablet Take 1 g by mouth every morning. 08/08/20   [provider]  sucralfate  (CARAFATE ) 1 GM/10ML suspension Take 10 mLs by mouth at bedtime. 02/16/22   [provider]  vitamin B-12 (CYANOCOBALAMIN ) 1000 MCG tablet Take 1,000 mcg by mouth daily.    [provider]    Allergies: Lorazepam , Ativan  [lorazepam ], and Metronidazole    Review of Systems  Constitutional:  Negative for chills and fever.  HENT:  Negative for ear pain and sore throat.   Eyes:  Negative for pain and visual disturbance.  Respiratory:  Negative for cough and shortness of breath.   Cardiovascular:  Negative for chest pain and palpitations.  Gastrointestinal:  Positive for diarrhea. Negative for abdominal pain and vomiting.  Genitourinary:  Positive for dysuria. Negative for hematuria.  Musculoskeletal:  Negative for arthralgias and back pain.  Skin:  Negative for color change and rash.  Neurological:  Negative for seizures and syncope.  Psychiatric/Behavioral:  Positive for confusion.   All other systems reviewed and are negative.   Updated Vital Signs BP 126/61   Pulse 72   Temp 99.6 F (37.6 C) (Oral)   Resp (!) 21   LMP  (LMP Unknown)   SpO2 96%   Physical Exam Vitals and nursing note reviewed.  Constitutional:      General: She is not in acute distress.    Appearance: Normal appearance. She is well-developed. She is not ill-appearing.  HENT:     Head: Normocephalic and atraumatic.     Mouth/Throat:     Mouth: Mucous membranes are dry.  Eyes:     Extraocular Movements: Extraocular movements intact.     Conjunctiva/sclera: Conjunctivae normal.     Pupils: Pupils are equal, round, and reactive to light.  Cardiovascular:     Rate and Rhythm: Normal rate and regular rhythm.     Heart sounds: No murmur heard. Pulmonary:     Effort: Pulmonary effort is normal. No respiratory distress.     Breath sounds: Normal breath  sounds.  Abdominal:     General: There is no distension.     Palpations: Abdomen is soft.     Tenderness: There is no abdominal tenderness. There is no guarding.  Musculoskeletal:        General: No swelling.     Cervical back: Neck supple.     Right lower leg: No edema.     Left lower leg: No edema.  Skin:    General: Skin is warm and dry.     Capillary Refill: Capillary refill takes less than 2 seconds.  Neurological:     General: No focal deficit present.     Mental Status: She is alert and oriented to person, place, and time.  Psychiatric:        Mood and Affect: Mood normal.     (  all labs ordered are listed, but only abnormal results are displayed) Labs Reviewed  URINALYSIS, ROUTINE W REFLEX MICROSCOPIC - Abnormal; Notable for the following components:      Result Value   APPearance CLOUDY (*)    Hgb urine dipstick MODERATE (*)    Protein, ur 100 (*)    Leukocytes,Ua LARGE (*)    All other components within normal limits  URINE CULTURE  URINE DRUG SCREEN  CBC WITH DIFFERENTIAL/PLATELET  COMPREHENSIVE METABOLIC PANEL WITH GFR  LIPASE, BLOOD    EKG: None  Radiology: No results found.   Procedures   Medications Ordered in the ED  sodium chloride  0.9 % bolus 500 mL (has no administration in time range)  0.9 %  sodium chloride  infusion (has no administration in time range)  ciprofloxacin  (CIPRO ) IVPB 400 mg (has no administration in time range)                                    Medical Decision Making Amount and/or Complexity of Data Reviewed Labs: ordered.  Risk Prescription drug management.   Patient nontoxic no acute distress.  Will check CBC complete metabolic panel lipase due to the history of the diarrhea abdomen is soft and nontender.  Apparently no blood.  The patient's frequent urinary tract infections could be secondary to bacterial UTI or could be interstitial cystitis.  Patient's urinalysis today does have 21-50 RBCs greater than 50 white  blood cells but no bacteria.  But also she has been still on the Septra suppression treatment.  Send urine for culture we will treat with IV Cipro  because family says that the only thing that seems to work.  They wanted it IV he did not wanted p.o.  Will probably discharge her with taking it p.o.  Urine culture will be very helpful in sorting this out.  Will see what we get on the labs.  Will give her a little bit of IV fluids.  She appears a little dehydrated.  Patient does not appear toxic.  CBC white count 10.1.  Hemoglobin 10.5 platelets 149.  Complete metabolic panel creatinine up a little bit at 1.39 alk phos up some at 164 but otherwise everything normal.  Patient's GFR is 33.  Lipase normal at 23.  Patient's GFR is essentially unchanged from June 30.  Will treat with Cipro  at home.  Have her follow-up with [facility] and her primary care doctor.  Urine culture pending.  Final diagnoses:  Acute cystitis without hematuria    ED Discharge Orders     None          Geraldene Hamilton, MD 08/14/23 1633    Geraldene Hamilton, MD 08/14/23 TRENNA

## 2023-08-14 NOTE — ED Triage Notes (Signed)
 Urine symptoms and confusion Ongoing UTI treated by pcp Has had multiple abt treatments Had diarrhea last night  Started new med at night also

## 2023-08-14 NOTE — Discharge Instructions (Addendum)
 Call to set up an appointment for follow-up with [facility].  Also follow-up with your primary care doctor.  Take the antibiotic Cipro  as directed for the next several days.  Return for any new or worse symptoms.  You can start the antibiotic tomorrow.  If cultures are negative this may be interstitial cystitis.

## 2023-08-15 LAB — URINE CULTURE: Culture: NO GROWTH

## 2023-10-06 ENCOUNTER — Other Ambulatory Visit: Payer: Self-pay

## 2023-10-06 ENCOUNTER — Emergency Department (HOSPITAL_COMMUNITY)

## 2023-10-06 ENCOUNTER — Inpatient Hospital Stay (HOSPITAL_COMMUNITY)
Admission: EM | Admit: 2023-10-06 | Discharge: 2023-10-10 | DRG: 193 | Disposition: A | Discharge status: Hospice | Attending: Hospitalist | Admitting: Hospitalist

## 2023-10-06 ENCOUNTER — Encounter (HOSPITAL_COMMUNITY): Payer: Self-pay

## 2023-10-06 DIAGNOSIS — I340 Nonrheumatic mitral (valve) insufficiency: Secondary | ICD-10-CM | POA: Diagnosis present

## 2023-10-06 DIAGNOSIS — E782 Mixed hyperlipidemia: Secondary | ICD-10-CM

## 2023-10-06 DIAGNOSIS — Z8616 Personal history of COVID-19: Secondary | ICD-10-CM

## 2023-10-06 DIAGNOSIS — Z8673 Personal history of transient ischemic attack (TIA), and cerebral infarction without residual deficits: Secondary | ICD-10-CM

## 2023-10-06 DIAGNOSIS — N390 Urinary tract infection, site not specified: Secondary | ICD-10-CM | POA: Diagnosis not present

## 2023-10-06 DIAGNOSIS — Z961 Presence of intraocular lens: Secondary | ICD-10-CM | POA: Diagnosis present

## 2023-10-06 DIAGNOSIS — Z87440 Personal history of urinary (tract) infections: Secondary | ICD-10-CM

## 2023-10-06 DIAGNOSIS — E559 Vitamin D deficiency, unspecified: Secondary | ICD-10-CM | POA: Diagnosis present

## 2023-10-06 DIAGNOSIS — Z9079 Acquired absence of other genital organ(s): Secondary | ICD-10-CM

## 2023-10-06 DIAGNOSIS — Z1152 Encounter for screening for COVID-19: Secondary | ICD-10-CM

## 2023-10-06 DIAGNOSIS — K219 Gastro-esophageal reflux disease without esophagitis: Secondary | ICD-10-CM | POA: Diagnosis present

## 2023-10-06 DIAGNOSIS — F32A Depression, unspecified: Secondary | ICD-10-CM | POA: Diagnosis present

## 2023-10-06 DIAGNOSIS — E860 Dehydration: Secondary | ICD-10-CM | POA: Diagnosis present

## 2023-10-06 DIAGNOSIS — D638 Anemia in other chronic diseases classified elsewhere: Secondary | ICD-10-CM | POA: Diagnosis present

## 2023-10-06 DIAGNOSIS — Z66 Do not resuscitate: Secondary | ICD-10-CM | POA: Diagnosis present

## 2023-10-06 DIAGNOSIS — E871 Hypo-osmolality and hyponatremia: Secondary | ICD-10-CM | POA: Diagnosis present

## 2023-10-06 DIAGNOSIS — J189 Pneumonia, unspecified organism: Principal | ICD-10-CM | POA: Diagnosis present

## 2023-10-06 DIAGNOSIS — D631 Anemia in chronic kidney disease: Secondary | ICD-10-CM | POA: Diagnosis present

## 2023-10-06 DIAGNOSIS — R531 Weakness: Secondary | ICD-10-CM | POA: Diagnosis not present

## 2023-10-06 DIAGNOSIS — H9193 Unspecified hearing loss, bilateral: Secondary | ICD-10-CM | POA: Diagnosis present

## 2023-10-06 DIAGNOSIS — I350 Nonrheumatic aortic (valve) stenosis: Secondary | ICD-10-CM | POA: Diagnosis present

## 2023-10-06 DIAGNOSIS — J9601 Acute respiratory failure with hypoxia: Secondary | ICD-10-CM | POA: Diagnosis present

## 2023-10-06 DIAGNOSIS — M1711 Unilateral primary osteoarthritis, right knee: Secondary | ICD-10-CM | POA: Diagnosis present

## 2023-10-06 DIAGNOSIS — I5033 Acute on chronic diastolic (congestive) heart failure: Secondary | ICD-10-CM | POA: Diagnosis present

## 2023-10-06 DIAGNOSIS — N1832 Chronic kidney disease, stage 3b: Secondary | ICD-10-CM | POA: Diagnosis present

## 2023-10-06 DIAGNOSIS — I480 Paroxysmal atrial fibrillation: Secondary | ICD-10-CM | POA: Diagnosis present

## 2023-10-06 DIAGNOSIS — Z79899 Other long term (current) drug therapy: Secondary | ICD-10-CM

## 2023-10-06 DIAGNOSIS — I4891 Unspecified atrial fibrillation: Principal | ICD-10-CM | POA: Diagnosis present

## 2023-10-06 DIAGNOSIS — Z90710 Acquired absence of both cervix and uterus: Secondary | ICD-10-CM

## 2023-10-06 DIAGNOSIS — Z8679 Personal history of other diseases of the circulatory system: Secondary | ICD-10-CM | POA: Diagnosis not present

## 2023-10-06 DIAGNOSIS — Z90722 Acquired absence of ovaries, bilateral: Secondary | ICD-10-CM

## 2023-10-06 DIAGNOSIS — E1122 Type 2 diabetes mellitus with diabetic chronic kidney disease: Secondary | ICD-10-CM | POA: Diagnosis present

## 2023-10-06 DIAGNOSIS — I2510 Atherosclerotic heart disease of native coronary artery without angina pectoris: Secondary | ICD-10-CM | POA: Diagnosis present

## 2023-10-06 DIAGNOSIS — I5031 Acute diastolic (congestive) heart failure: Secondary | ICD-10-CM | POA: Diagnosis not present

## 2023-10-06 DIAGNOSIS — D6859 Other primary thrombophilia: Secondary | ICD-10-CM | POA: Diagnosis present

## 2023-10-06 DIAGNOSIS — Z9012 Acquired absence of left breast and nipple: Secondary | ICD-10-CM

## 2023-10-06 DIAGNOSIS — Z884 Allergy status to anesthetic agent status: Secondary | ICD-10-CM

## 2023-10-06 DIAGNOSIS — H409 Unspecified glaucoma: Secondary | ICD-10-CM | POA: Diagnosis present

## 2023-10-06 DIAGNOSIS — I130 Hypertensive heart and chronic kidney disease with heart failure and stage 1 through stage 4 chronic kidney disease, or unspecified chronic kidney disease: Secondary | ICD-10-CM | POA: Diagnosis present

## 2023-10-06 DIAGNOSIS — Z7901 Long term (current) use of anticoagulants: Secondary | ICD-10-CM

## 2023-10-06 DIAGNOSIS — Z853 Personal history of malignant neoplasm of breast: Secondary | ICD-10-CM

## 2023-10-06 DIAGNOSIS — E785 Hyperlipidemia, unspecified: Secondary | ICD-10-CM | POA: Diagnosis present

## 2023-10-06 DIAGNOSIS — Z8542 Personal history of malignant neoplasm of other parts of uterus: Secondary | ICD-10-CM

## 2023-10-06 DIAGNOSIS — R638 Other symptoms and signs concerning food and fluid intake: Secondary | ICD-10-CM | POA: Diagnosis not present

## 2023-10-06 DIAGNOSIS — Z96641 Presence of right artificial hip joint: Secondary | ICD-10-CM | POA: Diagnosis present

## 2023-10-06 DIAGNOSIS — Z8544 Personal history of malignant neoplasm of other female genital organs: Secondary | ICD-10-CM

## 2023-10-06 DIAGNOSIS — Z8249 Family history of ischemic heart disease and other diseases of the circulatory system: Secondary | ICD-10-CM

## 2023-10-06 DIAGNOSIS — Z515 Encounter for palliative care: Secondary | ICD-10-CM

## 2023-10-06 DIAGNOSIS — Z7189 Other specified counseling: Secondary | ICD-10-CM | POA: Diagnosis not present

## 2023-10-06 DIAGNOSIS — Z789 Other specified health status: Secondary | ICD-10-CM | POA: Diagnosis not present

## 2023-10-06 DIAGNOSIS — Z888 Allergy status to other drugs, medicaments and biological substances status: Secondary | ICD-10-CM

## 2023-10-06 DIAGNOSIS — Z7902 Long term (current) use of antithrombotics/antiplatelets: Secondary | ICD-10-CM

## 2023-10-06 LAB — COMPREHENSIVE METABOLIC PANEL WITH GFR
ALT: 16 U/L (ref 0–44)
AST: 21 U/L (ref 15–41)
Albumin: 4 g/dL (ref 3.5–5.0)
Alkaline Phosphatase: 73 U/L (ref 38–126)
Anion gap: 11 (ref 5–15)
BUN: 24 mg/dL — ABNORMAL HIGH (ref 8–23)
CO2: 21 mmol/L — ABNORMAL LOW (ref 22–32)
Calcium: 9.1 mg/dL (ref 8.9–10.3)
Chloride: 98 mmol/L (ref 98–111)
Creatinine, Ser: 1.4 mg/dL — ABNORMAL HIGH (ref 0.44–1.00)
GFR, Estimated: 33 mL/min — ABNORMAL LOW (ref >60)
Glucose, Bld: 109 mg/dL — ABNORMAL HIGH (ref 70–99)
Potassium: 4.5 mmol/L (ref 3.5–5.1)
Sodium: 130 mmol/L — ABNORMAL LOW (ref 135–145)
Total Bilirubin: 1.2 mg/dL (ref 0.0–1.2)
Total Protein: 6.6 g/dL (ref 6.5–8.1)

## 2023-10-06 LAB — URINALYSIS, ROUTINE W REFLEX MICROSCOPIC
Bilirubin Urine: NEGATIVE
Glucose, UA: NEGATIVE mg/dL
Hgb urine dipstick: NEGATIVE
Ketones, ur: NEGATIVE mg/dL
Leukocytes,Ua: NEGATIVE
Nitrite: NEGATIVE
Protein, ur: NEGATIVE mg/dL
Specific Gravity, Urine: 1.013 (ref 1.005–1.030)
pH: 5 (ref 5.0–8.0)

## 2023-10-06 LAB — CBC WITH DIFFERENTIAL/PLATELET
Abs Immature Granulocytes: 0.05 K/uL (ref 0.00–0.07)
Basophils Absolute: 0 K/uL (ref 0.0–0.1)
Basophils Relative: 0 %
Eosinophils Absolute: 0.1 K/uL (ref 0.0–0.5)
Eosinophils Relative: 1 %
HCT: 32 % — ABNORMAL LOW (ref 36.0–46.0)
Hemoglobin: 10.1 g/dL — ABNORMAL LOW (ref 12.0–15.0)
Immature Granulocytes: 1 %
Lymphocytes Relative: 7 %
Lymphs Abs: 0.7 K/uL (ref 0.7–4.0)
MCH: 29 pg (ref 26.0–34.0)
MCHC: 31.6 g/dL (ref 30.0–36.0)
MCV: 92 fL (ref 80.0–100.0)
Monocytes Absolute: 0.5 K/uL (ref 0.1–1.0)
Monocytes Relative: 5 %
Neutro Abs: 9.4 K/uL — ABNORMAL HIGH (ref 1.7–7.7)
Neutrophils Relative %: 86 %
Platelets: 156 K/uL (ref 150–400)
RBC: 3.48 MIL/uL — ABNORMAL LOW (ref 3.87–5.11)
RDW: 15.2 % (ref 11.5–15.5)
WBC: 10.8 K/uL — ABNORMAL HIGH (ref 4.0–10.5)
nRBC: 0 % (ref 0.0–0.2)

## 2023-10-06 LAB — RESP PANEL BY RT-PCR (RSV, FLU A&B, COVID)  RVPGX2
Influenza A by PCR: NEGATIVE
Influenza B by PCR: NEGATIVE
Resp Syncytial Virus by PCR: NEGATIVE
SARS Coronavirus 2 by RT PCR: NEGATIVE

## 2023-10-06 LAB — PROTIME-INR
INR: 1.2 (ref 0.8–1.2)
Prothrombin Time: 16.2 s — ABNORMAL HIGH (ref 11.4–15.2)

## 2023-10-06 LAB — BRAIN NATRIURETIC PEPTIDE: B Natriuretic Peptide: 762.4 pg/mL — ABNORMAL HIGH (ref 0.0–100.0)

## 2023-10-06 LAB — I-STAT CG4 LACTIC ACID, ED
Lactic Acid, Venous: 1.1 mmol/L (ref 0.5–1.9)
Lactic Acid, Venous: 1.2 mmol/L (ref 0.5–1.9)

## 2023-10-06 MED ORDER — ATORVASTATIN CALCIUM 10 MG PO TABS
10.0000 mg | ORAL_TABLET | Freq: Every day | ORAL | Status: DC
Start: 2023-10-06 — End: 2023-10-09
  Administered 2023-10-07 – 2023-10-08 (×3): 10 mg via ORAL
  Filled 2023-10-06 (×3): qty 1

## 2023-10-06 MED ORDER — ALPRAZOLAM 0.25 MG PO TABS
0.2500 mg | ORAL_TABLET | Freq: Once | ORAL | Status: DC | PRN
  Filled 2023-10-06: qty 1

## 2023-10-06 MED ORDER — HALOPERIDOL LACTATE 5 MG/ML IJ SOLN
5.0000 mg | Freq: Once | INTRAMUSCULAR | Status: AC
  Administered 2023-10-06: 5 mg via INTRAVENOUS

## 2023-10-06 MED ORDER — SODIUM CHLORIDE 0.9 % IV SOLN
1.0000 g | INTRAVENOUS | Status: DC
  Administered 2023-10-07 – 2023-10-09 (×3): 1 g via INTRAVENOUS
  Filled 2023-10-06 (×3): qty 10

## 2023-10-06 MED ORDER — ACETAMINOPHEN 325 MG PO TABS
650.0000 mg | ORAL_TABLET | Freq: Four times a day (QID) | ORAL | Status: DC | PRN
  Administered 2023-10-07 – 2023-10-10 (×6): 650 mg via ORAL
  Filled 2023-10-06 (×6): qty 2

## 2023-10-06 MED ORDER — CLOPIDOGREL BISULFATE 75 MG PO TABS
75.0000 mg | ORAL_TABLET | Freq: Every day | ORAL | Status: DC
Start: 2023-10-07 — End: 2023-10-09
  Administered 2023-10-07 – 2023-10-09 (×3): 75 mg via ORAL
  Filled 2023-10-06 (×3): qty 1

## 2023-10-06 MED ORDER — HALOPERIDOL LACTATE 5 MG/ML IJ SOLN
2.0000 mg | Freq: Once | INTRAMUSCULAR | Status: DC | PRN
  Filled 2023-10-06: qty 1

## 2023-10-06 MED ORDER — HALOPERIDOL LACTATE 5 MG/ML IJ SOLN
5.0000 mg | Freq: Four times a day (QID) | INTRAMUSCULAR | Status: DC | PRN
  Administered 2023-10-06 – 2023-10-08 (×4): 5 mg via INTRAVENOUS
  Filled 2023-10-06 (×5): qty 1

## 2023-10-06 MED ORDER — ONDANSETRON HCL 4 MG/2ML IJ SOLN
4.0000 mg | Freq: Four times a day (QID) | INTRAMUSCULAR | Status: DC | PRN
  Administered 2023-10-08: 4 mg via INTRAVENOUS
  Filled 2023-10-06: qty 2

## 2023-10-06 MED ORDER — SODIUM CHLORIDE 0.9 % IV SOLN
500.0000 mg | INTRAVENOUS | Status: DC
Start: 2023-10-06 — End: 2023-10-06

## 2023-10-06 MED ORDER — AMIODARONE HCL IN DEXTROSE 360-4.14 MG/200ML-% IV SOLN
30.0000 mg/h | INTRAVENOUS | Status: DC
  Administered 2023-10-06 – 2023-10-09 (×7): 30 mg/h via INTRAVENOUS
  Filled 2023-10-06 (×6): qty 200

## 2023-10-06 MED ORDER — PANTOPRAZOLE SODIUM 40 MG PO TBEC
40.0000 mg | DELAYED_RELEASE_TABLET | Freq: Every morning | ORAL | Status: DC
Start: 2023-10-07 — End: 2023-10-09
  Administered 2023-10-07 – 2023-10-09 (×3): 40 mg via ORAL
  Filled 2023-10-06 (×3): qty 1

## 2023-10-06 MED ORDER — LACTATED RINGERS IV SOLN: INTRAVENOUS | Status: DC

## 2023-10-06 MED ORDER — ACETAMINOPHEN 650 MG RE SUPP: 650.0000 mg | Freq: Four times a day (QID) | RECTAL | Status: DC | PRN

## 2023-10-06 MED ORDER — LEVALBUTEROL HCL 1.25 MG/0.5ML IN NEBU
1.2500 mg | INHALATION_SOLUTION | RESPIRATORY_TRACT | Status: DC | PRN
  Administered 2023-10-06 – 2023-10-08 (×5): 1.25 mg via RESPIRATORY_TRACT
  Filled 2023-10-06 (×7): qty 0.5

## 2023-10-06 MED ORDER — AMIODARONE HCL IN DEXTROSE 360-4.14 MG/200ML-% IV SOLN
60.0000 mg/h | INTRAVENOUS | Status: DC
Start: 2023-10-06 — End: 2023-10-06

## 2023-10-06 MED ORDER — SODIUM CHLORIDE 0.9 % IV SOLN
2.0000 g | Freq: Once | INTRAVENOUS | Status: AC
  Administered 2023-10-06: 2 g via INTRAVENOUS
  Filled 2023-10-06: qty 12.5

## 2023-10-06 MED ORDER — MELATONIN 3 MG PO TABS
3.0000 mg | ORAL_TABLET | Freq: Every evening | ORAL | Status: DC | PRN
  Administered 2023-10-07 – 2023-10-08 (×3): 3 mg via ORAL
  Filled 2023-10-06 (×3): qty 1

## 2023-10-06 MED ORDER — SODIUM CHLORIDE 0.9 % IV SOLN
100.0000 mg | Freq: Two times a day (BID) | INTRAVENOUS | Status: DC
  Administered 2023-10-06 – 2023-10-08 (×4): 100 mg via INTRAVENOUS
  Filled 2023-10-06 (×5): qty 100

## 2023-10-06 NOTE — Progress Notes (Addendum)
 Patient to floor.  Oriented only to self. Very confused and afraid. Keeps asking if she is dreaming. Patient's daughter and son-in-law room with her.  Patient soon began spitting applesauce at family, doesn't recognize her daughter or son-in-law, and is pinching and verbally aggressive.  Patient's family members are asking for something to calm her down, mention NO ativan  due to past effects. Dr. Marcene paged. Haldol  given, see orders.

## 2023-10-06 NOTE — ED Triage Notes (Addendum)
 Pt BIBA from dr office w/ c/o afib. Per EMS pt has hx of afib but is not currently being treated for it. Pt presented to dr office w/ c/o SOB and cough. Pt denies any complaints at this time. Pt poor historian.

## 2023-10-06 NOTE — ED Provider Notes (Signed)
 [place] EMERGENCY DEPARTMENT AT [facility] Provider Note   CSN: [id number] Arrival date & time: 10/06/23  1616     Patient presents with: Weakness   Sheri Hensley is a 89 y.o. female.   HPI    Sheri Hensley patient comes in with chief complaint of weakness, shortness of breath, cough and congestion.   Patient accompanied by daughter.  Patient had gone to the urgent care where they noted that patient was in A-fib, and sent her to the ER.  Patient has known history of paroxysmal A-fib, she is on amiodarone , hypertension, TIA, hyperlipidemia and has had multiple UTI including per daughter, Pseudomonas infection.  Patient has been sluggish the last few days according to the daughter, however in the last 2 or 3 days, her symptoms have progressed as far as activity level is concerned.  Patient is barely able to get up and walk like she normally does.  She has had some cough in the last 2 days with a rattle, they decided to take her to the urgent care, but patient's heart rate was noted to be in 140s and she was sent to the ER immediately.  Patient denies any chest pain.  She does admit to some shortness of breath, cough, wheezing.  She has burning with urination as well along with urinary frequency.  Prior to Admission medications   Medication Sig Start Date End Date Taking? Authorizing Provider  acetaminophen  (TYLENOL ) 325 MG tablet Take 2 tablets (650 mg total) by mouth every 6 (six) hours as needed for mild pain or headache (fever >/= 101). 01/18/19   Elgergawy, Brayton RAMAN, MD  ALPHAGAN  P 0.1 % SOLN Apply 1 drop to eye 2 (two) times daily. 04/02/20   [provider]  amiodarone  (PACERONE ) 200 MG tablet TAKE 0.5 TABLETS (100 MG TOTAL) BY MOUTH EVERY MORNING. 02/04/23   Revankar, Jennifer SAUNDERS, MD  atorvastatin  (LIPITOR) 10 MG tablet Take 1 tablet (10 mg total) by mouth at bedtime. 03/29/17   Revankar, Rajan R, MD  bimatoprost (LUMIGAN) 0.01 % SOLN Place 1 drop into  both eyes at bedtime.    [provider]  Cholecalciferol  (VITAMIN D ) 50 MCG (2000 UT) tablet Take 2,000 Units by mouth daily.    [provider]  ciprofloxacin  (CIPRO ) 500 MG tablet Take 1 tablet (500 mg total) by mouth every 12 (twelve) hours. 08/14/23   Zackowski, Scott, MD  clopidogrel  (PLAVIX ) 75 MG tablet Take 1 tablet (75 mg total) by mouth daily. 12/24/15   Ghimire, Donalda HERO, MD  isosorbide  mononitrate (ISMO ) 10 MG tablet Take 10 mg by mouth 2 (two) times daily. 12/31/19   [provider]  lidocaine  (LIDODERM ) 5 % Place 1 patch onto the skin daily. Remove & Discard patch within 12 hours or as directed by MD 05/28/23   Patt Alm Macho, MD  loratadine  (CLARITIN ) 10 MG tablet Take 10 mg by mouth at bedtime.    [provider]  losartan -hydrochlorothiazide  (HYZAAR) 50-12.5 MG tablet Take 1 tablet by mouth daily. 07/15/20   [provider]  melatonin 5 MG TABS Take 5 mg by mouth at bedtime.    [provider]  Multiple Vitamins-Minerals (CENTRUM VITAMINTS PO) Take 1 tablet by mouth every morning.    [provider]  Multiple Vitamins-Minerals (ICAPS AREDS 2 PO) Take 1 capsule by mouth daily.    [provider]  Omega-3 Fatty Acids (FISH OIL ) 1000 MG CAPS Take 2 capsules (2,000 mg total) by mouth in the  morning and at bedtime. 03/13/20   Revankar, Jennifer SAUNDERS, MD  ondansetron  (ZOFRAN ) 4 MG tablet Take 4 mg by mouth every 8 (eight) hours as needed for nausea/vomiting. 07/31/20   [provider]  pantoprazole  (PROTONIX ) 40 MG tablet Take 40 mg by mouth every morning. 08/09/20   [provider]  Probiotic Product (PROBIOTIC PO) Take 1 capsule by mouth daily in the afternoon.    [provider]  sertraline  (ZOLOFT ) 50 MG tablet Take 50 mg by mouth daily.    [provider]  sucralfate  (CARAFATE ) 1 g tablet Take 1 g by mouth every morning. 08/08/20   [provider]  sucralfate  (CARAFATE ) 1  GM/10ML suspension Take 10 mLs by mouth at bedtime. 02/16/22   [provider]  vitamin B-12 (CYANOCOBALAMIN ) 1000 MCG tablet Take 1,000 mcg by mouth daily.    [provider]    Allergies: Lorazepam , Ativan  Critias.Coss ], and Metronidazole    Review of Systems  All other systems reviewed and are negative.   Updated Vital Signs BP 113/86 (BP Location: Right Arm)   Pulse (!) 113   Temp 97.6 F (36.4 C) (Oral)   Resp 18   Ht 5' 5 (1.651 m)   Wt 55.8 kg   LMP  (LMP Unknown)   SpO2 95%   BMI 20.47 kg/m   Physical Exam Vitals and nursing note reviewed.  Constitutional:      Appearance: She is well-developed.  HENT:     Head: Atraumatic.  Eyes:     Extraocular Movements: Extraocular movements intact.     Pupils: Pupils are equal, round, and reactive to light.  Cardiovascular:     Rate and Rhythm: Tachycardia present. Rhythm irregular.  Pulmonary:     Effort: Pulmonary effort is normal.  Musculoskeletal:        General: No swelling.     Cervical back: Normal range of motion and neck supple.     Right lower leg: No edema.     Left lower leg: No edema.  Skin:    General: Skin is warm and dry.  Neurological:     Mental Status: She is alert and oriented to person, place, and time.     (all labs ordered are listed, but only abnormal results are displayed) Labs Reviewed  COMPREHENSIVE METABOLIC PANEL WITH GFR - Abnormal; Notable for the following components:      Result Value   Sodium 130 (*)    CO2 21 (*)    Glucose, Bld 109 (*)    BUN 24 (*)    Creatinine, Ser 1.40 (*)    GFR, Estimated 33 (*)    All other components within normal limits  CBC WITH DIFFERENTIAL/PLATELET - Abnormal; Notable for the following components:   WBC 10.8 (*)    RBC 3.48 (*)    Hemoglobin 10.1 (*)    HCT 32.0 (*)    Neutro Abs 9.4 (*)    All other components within normal limits  PROTIME-INR - Abnormal; Notable for the following components:   Prothrombin Time 16.2 (*)     All other components within normal limits  BRAIN NATRIURETIC PEPTIDE - Abnormal; Notable for the following components:   B Natriuretic Peptide 762.4 (*)    All other components within normal limits  RESP PANEL BY RT-PCR (RSV, FLU A&B, COVID)  RVPGX2  CULTURE, BLOOD (ROUTINE X 2)  CULTURE, BLOOD (ROUTINE X 2)  URINE CULTURE  URINALYSIS, ROUTINE W REFLEX MICROSCOPIC  I-STAT CG4 LACTIC ACID,  ED  I-STAT CG4 LACTIC ACID, ED    EKG: None  Radiology: [facility] Chest Port 1 View Result Date: 10/06/2023 CLINICAL DATA:  Question sepsis. EXAM: PORTABLE CHEST 1 VIEW COMPARISON:  Chest radiograph dated 07/23/2023. FINDINGS: There is diffuse chronic interstitial coarsening. Faint diffuse interstitial densities may represent chronic changes, edema, or atypical pneumonia. No focal consolidation, pleural effusion or pneumothorax. Stable cardiac silhouette. No acute osseous pathology. IMPRESSION: Chronic interstitial coarsening. No focal consolidation. Electronically Signed   By: Vanetta Chou M.D.   On: 10/06/2023 18:54     .Critical Care  Performed by: Charlyn Sora, MD Authorized by: Charlyn Sora, MD   Critical care provider statement:    Critical care time (minutes):  48   Critical care was necessary to treat or prevent imminent or life-threatening deterioration of the following conditions:  Circulatory failure and sepsis   Critical care was time spent personally by me on the following activities:  Development of treatment plan with patient or surrogate, discussions with consultants, evaluation of patient's response to treatment, examination of patient, ordering and review of laboratory studies, ordering and review of radiographic studies, ordering and performing treatments and interventions, pulse oximetry, re-evaluation of patient's condition, review of old charts and obtaining history from patient or surrogate    Medications Ordered in the ED  lactated ringers  infusion ( Intravenous New  Bag/Given 10/06/23 1837)  amiodarone  (NEXTERONE  PREMIX) 360-4.14 MG/200ML-% (1.8 mg/mL) IV infusion (30 mg/hr Intravenous New Bag/Given 10/06/23 1936)  ceFEPIme  (MAXIPIME ) 2 g in sodium chloride  0.9 % 100 mL IVPB (0 g Intravenous Stopped 10/06/23 1930)                                    Medical Decision Making Amount and/or Complexity of Data Reviewed Labs: ordered. Radiology: ordered.  Risk Prescription drug management. Decision regarding hospitalization.   This patient presents to the ED with chief complaint(s) of elevated heart rate, weakness, cough, burning with urination with pertinent past medical history of paroxysmal A-fib, TIA, hypertension, hyperlipidemia and recurrent UTI.The complaint involves an extensive differential diagnosis and also carries with it a high risk of complications and morbidity.    Patient noted to be in A-fib with RVR.  Likely triggered by underlying physiologic changes. The differential diagnosis includes infection such as UTI, sepsis, pneumonia, electrolyte abnormality, severe dehydration and COVID-19  The initial plan is to get basic labs.  Patient is having dysuria.  We will start sepsis screen.  She has a cough, will get chest x-ray as well.  Will start patient on amnio drip.   Additional history obtained: Additional history obtained from family Records reviewed Primary Care Documents  Independent labs interpretation:  The following labs were independently interpreted: Slightly elevated white count.  Creatinine is at baseline.  Independent visualization and interpretation of imaging: - I independently visualized the following imaging with scope of interpretation limited to determining acute life threatening conditions related to emergency care: X-ray of the chest, which revealed likely right-sided pneumonia. Per radiologist, aspiration versus pneumonia.  Treatment and Reassessment: Patient started on Amio drip.  Cefepime  given. Stable for  admission.   Final diagnoses:  Atrial fibrillation with RVR (HCC)  Community acquired pneumonia, unspecified laterality    ED Discharge Orders     None          Charlyn Sora, MD 10/06/23 2011

## 2023-10-06 NOTE — ED Notes (Signed)
 Pt w/ hx of left side mastectomy. EMS placed IV on left side. Per MD, ok to leave IV in place at this time.

## 2023-10-06 NOTE — H&P (Signed)
 History and Physical      Sheri Hensley FMW:[id number] DOB: [birth year] DOA: 10/06/2023; DOS: 10/06/2023  PCP: Charlott Dorn LABOR, MD  Patient coming from: home   I have personally briefly reviewed patient's old medical records in [facility]  Chief Complaint: Generalized weakness and  HPI: Sheri Hensley is a 89 y.o. female with medical history significant for paroxysmal atrial fibrillation not on anticoagulation, hyperlipidemia, chronic diastolic heart failure, moderate to severe mitral digitation, anemia of chronic disease with baseline hemoglobin 9-11, who is [facility]23 with atrial fibrillation with RVR after presenting from home to [facility] ED complaining of generalized weakness.   The patient reports 1 week of generalized weakness in the absence of any acute focal weakness.  She notes that the generalized weakness over that timeframe has been progressive, and has significantly limited her ability to ambulate over the last 2 to 3 days, representing a significant decline from her baseline ambulatory/activity habits.  Over the last week, she is also noted new onset cough, nonproductive, and in the absence of any hemoptysis.  She is also noted some mild shortness of breath, worse with exertion, in the absence of any associated orthopnea, PND, or worsening peripheral edema.  No recent chest pain, palpitations, diaphoresis.  She does however note mild decrease in oral intake over the last week. denies any associated subjective fever, chills or rigors, or generalized myalgias.   Over the course last week, she has also noted new onset dysuria, in the absence of any gross hematuria.  She has a reported history of recurrent urinary tract infections, including a history of Pseudomonas urinary tract infection, as conveyed by her family at bedside.    She has a history of paroxysmal atrial fibrillation and is on chronic oral amiodarone  therapy.  However, in  the setting of perceived increased fall risk, she is not on chronic anticoagulation as an outpatient.  Rather, she is on daily Plavix .  Aside from amiodarone , not on any additional AV nodal blocking agents at home.  Her cardiac history is also notable for chronic diastolic heart failure, moderate to severe mitral regurgitation.  Not on any diuretic medications at home.  Most recent echocardiogram occurred in April 2023 was notable for LVEF 60 to 65%, no evidence of wall motion abnormalities, grade 1 diastolic dysfunction, moderate to severe mitral digitation, mild aortic stenosis, mild aortic regurgitation, as well as moderately dilated left atrium.  In the setting of the above symptoms, she presented to the local urgent care earlier today, at which time she was noted to be in atrial fibrillation with RVR, with associated heart rates in the 140s, prompting recommendation for the patient to present to the emergency department for further evaluation and management thereof.    ED Course:  Vital signs in the ED were notable for the following: Afebrile; initial heart rates while in atrial fibrillation with RVR, were noted to be in the 140s, subsequent decreasing into the 1 teens to 120s following initiation of IV amiodarone , as further detailed below; systolic pressures in the 1 teens to 130s; respiratory rate 18-27, oxygen saturation 95 to 99% on room air.  Labs were notable for the following: CMP was notable for the following: Sodium 130 compared to 138 on 08/14/2023, potassium 4.5, chloride 98, bicarbonate 21, anion gap 11, creatinine 1.40 compared to 1.39 on 08/14/2023, liver enzymes were within normal limits.  BNP 762 compared to most recent prior value of 1100 in December 2020.  Lactic acid 1.1, with repeat noted to be 1.2.  CBC notable for white cell count 10,800 with 86% neutrophils, hemoglobin 10.1 associated Neuraceq/Norocarp properties and relative demonstration prior hemoglobin data point of 10.5  on 08/14/2023, platelet count 156.  INR 1.2.  Urinalysis notable for the following: No white blood cells, leukocyte esterase/nitrate negative, positive for hyaline cast.  Blood cultures x 2 as well as urine culture were collected prior to initiation of IV antibiotics.  COVID, influenza, RSV PCR were all negative.  Per my interpretation, EKG in ED demonstrated the following: EKG was performed in the ED and reported to demonstrate atrial fibrillation with RVR, with heart rates in the 140s, without overt evidence of acute ischemic changes, but this EKG has not yet been released from independent review.  Imaging in the ED, per corresponding formal radiology read, was notable for the following: 1 view chest x-ray showed diffuse chronic interstitial coarsening in addition to faint diffuse interstitial opacities, which, per radiology report, may represent chronic changes versus interstitial edema versus atypical pneumonia, will demonstrating no overt evidence of infiltrate, pleural effusion, or pneumothorax.  While in the ED, the following were administered: In context of her initial reported dysuria with a history of Pseudomonas urinary tract infection, she received a dose of cefepime  prior to her urinalysis result becoming available.  Additionally, in the ED she was started on IV amiodarone  infusion, as well as lactated Ringer 's at 150 cc/h.  Subsequently, the patient was admitted for further evaluation management of presenting atrial fibrillation with RVR, with presenting labs also notable for acute hyponatremia presenting with complaint of generalized weakness.    Review of Systems: As per HPI otherwise 10 point review of systems negative.   Past Medical History:  Diagnosis Date   Abnormal radiologic findings on diagnostic imaging of right testicle 02/23/2021   Acquired absence of both cervix and uterus 03/11/2020   Acquired thrombophilia (HCC) 03/11/2020   Acute respiratory failure with hypoxia (HCC)  01/08/2019   AF (paroxysmal atrial fibrillation) (HCC) 08/29/2020   AKI (acute kidney injury) (HCC) 08/29/2020   Atrial fibrillation (HCC)    Bruises easily    Cancer (HCC) 1998   breast and uterine   Chronic kidney disease due to hypertension 03/11/2020   Closed right hip fracture (HCC) 08/28/2020   Constipation 03/11/2020   Coronary artery disease    CARDIOLOGIST-  DR EDWYNA ([facility])   COVID-19 virus infection 01/08/2019   Elevated bilirubin 12/23/2015   Endometrial cancer (HCC)    Endometrial cancer (HCC)    Essential (primary) hypertension 10/24/2014   Essential hypertension 10/24/2014   Falls 06/2017   Patient fell and hit her head , patient's daughter's states patient may have fell on the kitchen counter   Frailty 03/11/2020   GERD (gastroesophageal reflux disease)    Glaucoma, both eyes    Herpes zoster without complication 03/11/2020   History of breast cancer    1998--  s/p  left mastectomy with sln dissection's  ,  no chemo or radiation-  no recurrence   History of cancer of uterine body 02/23/2021   History of Clostridium difficile infection 5-6 yrs ago   Hyperlipidemia    Hypertension    Hyponatremia 12/23/2015   Insomnia 04/28/2021   Macular degeneration    Major depression 04/28/2021   Narrow complex tachycardia [facility])    cardiologist-  dr elfredia   OA (osteoarthritis)    right knee   Overactive bladder 03/11/2020   Paroxysmal atrial fibrillation (HCC)  12/23/2015   Personal history of transient ischemic attack (TIA), and cerebral infarction without residual deficits 12/22/2015   yrs ago ? 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   Standard chest x-ray abnormal 03/11/2020   Thickened endometrium    TIA (transient ischemic attack) 12/22/2015   Type 2 diabetes mellitus with other specified complication (HCC) 03/11/2020   Vitamin D  deficiency 03/11/2020   Wears glasses     Past Surgical History:  Procedure Laterality Date   APPENDECTOMY  1954   CATARACT EXTRACTION W/ INTRAOCULAR LENS  IMPLANT, BILATERAL  1994   colonscopy     DILATION AND CURETTAGE OF UTERUS N/A 11/20/2015   Procedure: DILATATION AND CURETTAGE;  Surgeon: Maurilio Ship, MD;  Location: [facility];  Service: Gynecology;  Laterality: N/A;   DILATION AND CURETTAGE, DIAGNOSTIC / THERAPEUTIC N/A novmber 2017   per daughter with biopsy as stated   KNEE ARTHROSCOPY Right 2002   MASTECTOMY Left 1998   w/ lympn node dissection's   ROBOTIC ASSISTED TOTAL HYSTERECTOMY WITH BILATERAL SALPINGO OOPHERECTOMY Bilateral 01/27/2016   Procedure: XI ROBOTIC ASSISTED TOTAL HYSTERECTOMY WITH BILATERAL SALPINGO OOPHORECTOMY;  Surgeon: Maurilio Ship, MD;  Location: [facility];  Service: Gynecology;  Laterality: Bilateral;   TOTAL HIP ARTHROPLASTY Right 08/30/2020   Procedure: HEMI  HIP ARTHROPLASTY ANTERIOR APPROACH;  Surgeon: Fidel Rogue, MD;  Location: [facility];  Service: Orthopedics;  Laterality: Right;    Social History:  reports that she has never smoked. She has never used smokeless tobacco. She reports that she does not drink alcohol and does not use drugs.   Allergies  Allergen Reactions   Lorazepam  Other (See Comments)    Hallucinations   Ativan  [Lorazepam ]     HALLUCINATIONS   Metronidazole Rash    Other reaction(s): rash    Family History  Problem Relation Age of Onset   Hypertension Mother    Hypertension Father    Liver cancer Brother    Colon cancer Brother     Family history reviewed and not pertinent    Prior to Admission medications   Medication Sig Start Date  End Date Taking? Authorizing Provider  acetaminophen  (TYLENOL ) 325 MG tablet Take 2 tablets (650 mg total) by mouth every 6 (six) hours as needed for mild pain or headache (fever >/= 101). 01/18/19   Elgergawy, Brayton RAMAN, MD  ALPHAGAN  P 0.1 % SOLN Apply 1 drop to eye 2 (two) times daily. 04/02/20   [provider]  amiodarone  (PACERONE ) 200 MG tablet TAKE 0.5 TABLETS (100 MG TOTAL) BY MOUTH EVERY MORNING. 02/04/23   Revankar, Jennifer SAUNDERS, MD  atorvastatin  (LIPITOR) 10 MG tablet Take 1 tablet (10 mg total) by mouth at bedtime. 03/29/17   Revankar, Rajan R, MD  bimatoprost (LUMIGAN) 0.01 % SOLN Place 1 drop into both eyes at bedtime.    [provider]  Cholecalciferol  (VITAMIN D ) 50 MCG (2000 UT) tablet Take 2,000 Units by mouth daily.    [provider]  ciprofloxacin  (CIPRO ) 500 MG tablet Take 1 tablet (500 mg total) by mouth every 12 (twelve) hours. 08/14/23   Zackowski, Scott, MD  clopidogrel  (PLAVIX ) 75 MG tablet Take 1 tablet (75 mg total) by mouth daily. 12/24/15   Ghimire, Donalda HERO, MD  isosorbide  mononitrate (ISMO ) 10 MG tablet Take 10 mg by mouth 2 (two) times daily. 12/31/19   [provider]  lidocaine  (LIDODERM ) 5 % Place 1 patch onto the skin daily. Remove & Discard patch within 12 hours or as directed by MD 05/28/23   Patt Alm Macho, MD  loratadine  (CLARITIN ) 10 MG tablet Take 10 mg by mouth at bedtime.    [provider]  losartan -hydrochlorothiazide  (HYZAAR) 50-12.5 MG tablet Take 1 tablet by mouth daily. 07/15/20   [provider]  melatonin 5 MG TABS Take 5 mg by mouth at bedtime.    [provider]  Multiple Vitamins-Minerals (CENTRUM VITAMINTS PO) Take 1 tablet by mouth every morning.    [provider]  Multiple Vitamins-Minerals (ICAPS AREDS 2 PO) Take 1 capsule by mouth daily.    [provider]  Omega-3 Fatty Acids (FISH OIL ) 1000 MG CAPS Take 2 capsules (2,000 mg total) by mouth in the morning and at  bedtime. 03/13/20   Revankar, Jennifer SAUNDERS, MD  ondansetron  (ZOFRAN ) 4 MG tablet Take 4 mg by mouth every 8 (eight) hours as needed for nausea/vomiting. 07/31/20   [provider]  pantoprazole  (PROTONIX ) 40 MG tablet Take 40 mg by mouth every morning. 08/09/20   [provider]  Probiotic Product (PROBIOTIC PO) Take 1 capsule by mouth daily in the afternoon.    [provider]  sertraline  (ZOLOFT ) 50 MG tablet Take 50 mg by mouth daily.    [provider]  sucralfate  (CARAFATE ) 1 g tablet Take 1 g by mouth every morning. 08/08/20   [provider]  sucralfate  (CARAFATE ) 1 GM/10ML suspension Take 10 mLs by mouth at bedtime. 02/16/22   [provider]  vitamin B-12 (CYANOCOBALAMIN ) 1000 MCG tablet Take 1,000 mcg by mouth daily.    [provider]     Objective    Physical Exam: Vitals:   10/06/23 1815 10/06/23 1927 10/06/23 1948 10/06/23 2052  BP: (!) 136/96 121/84 113/86   Pulse:  (!) 128 (!) 113   Resp:  20 18   Temp:    97.6 F (36.4 C)  TempSrc:    Oral  SpO2:  99% 95%   Weight:      Height:        General: appears to be stated age; alert; confused Skin: warm, dry, no rash Head:  AT/[place] Mouth:  Oral mucosa membranes appear moist, normal dentition Neck: supple; trachea midline Heart:  tachycardia, irregular; 2/6 holosystolic murmur noted.  Lungs: CTAB, did not appreciate any wheezes, rales, or rhonchi Abdomen: + BS; soft, ND, NT Vascular: 2+ pedal pulses b/l; 2+ radial pulses b/l Extremities: no peripheral edema, no muscle wasting   Labs on Admission: I have personally reviewed following labs and imaging studies  CBC: Recent Labs  Lab 10/06/23 1805  WBC 10.8*  NEUTROABS 9.4*  HGB 10.1*  HCT 32.0*  MCV 92.0  PLT 156   Basic Metabolic Panel: Recent Labs  Lab 10/06/23 1805  NA 130*  K 4.5  CL 98  CO2 21*  GLUCOSE 109*  BUN 24*  CREATININE 1.40*  CALCIUM  9.1   GFR: Estimated Creatinine Clearance: 18.4  mL/min (A) (by C-G formula based on SCr of 1.4 mg/dL (H)). Liver Function Tests: Recent Labs  Lab 10/06/23 1805  AST 21  ALT 16  ALKPHOS 73  BILITOT  1.2  PROT 6.6  ALBUMIN 4.0   No results for input(s): LIPASE, AMYLASE in the last 168 hours. No results for input(s): AMMONIA in the last 168 hours. Coagulation Profile: Recent Labs  Lab 10/06/23 1805  INR 1.2   Cardiac Enzymes: No results for input(s): CKTOTAL, CKMB, CKMBINDEX, TROPONINI in the last 168 hours. BNP (last 3 results) No results for input(s): PROBNP in the last 8760 hours. HbA1C: No results for input(s): HGBA1C in the last 72 hours. CBG: No results for input(s): GLUCAP in the last 168 hours. Lipid Profile: No results for input(s): CHOL, HDL, LDLCALC, TRIG, CHOLHDL, LDLDIRECT in the last 72 hours. Thyroid  Function Tests: No results for input(s): TSH, T4TOTAL, FREET4, T3FREE, THYROIDAB in the last 72 hours. Anemia Panel: No results for input(s): VITAMINB12, FOLATE, FERRITIN, TIBC, IRON , RETICCTPCT in the last 72 hours. Urine analysis:    Component Value Date/Time   COLORURINE YELLOW 10/06/2023 1807   APPEARANCEUR CLEAR 10/06/2023 1807   LABSPEC 1.013 10/06/2023 1807   PHURINE 5.0 10/06/2023 1807   GLUCOSEU NEGATIVE 10/06/2023 1807   HGBUR NEGATIVE 10/06/2023 1807   BILIRUBINUR NEGATIVE 10/06/2023 1807   KETONESUR NEGATIVE 10/06/2023 1807   PROTEINUR NEGATIVE 10/06/2023 1807   NITRITE NEGATIVE 10/06/2023 1807   LEUKOCYTESUR NEGATIVE 10/06/2023 1807    Radiological Exams on Admission: DG Chest Port 1 View Result Date: 10/06/2023 CLINICAL DATA:  Question sepsis. EXAM: PORTABLE CHEST 1 VIEW COMPARISON:  Chest radiograph dated 07/23/2023. FINDINGS: There is diffuse chronic interstitial coarsening. Faint diffuse interstitial densities may represent chronic changes, edema, or atypical pneumonia. No focal consolidation, pleural effusion or pneumothorax. Stable  cardiac silhouette. No acute osseous pathology. IMPRESSION: Chronic interstitial coarsening. No focal consolidation. Electronically Signed   By: Vanetta Chou M.D.   On: 10/06/2023 18:54      Assessment/Plan   Principal Problem:   Atrial fibrillation with RVR (HCC) Active Problems:   HLD (hyperlipidemia)   Acute hyponatremia   Depression   Generalized weakness   History of essential hypertension   Anemia of chronic disease     #) Atrial fibrillation with RVR: In the setting of a known h/o of paroxysmal atrial fibrillation, noted to be in A-fib RVR with initial HR's in the 140s,, subsequently decreasing into the low 100s to 120s following initiation of amiodarone  drip . BP has tolerated both the RVR as well as ensuing initiation of diltiazem  drip, without any overt hypotension.    In terms of factors leading to this exacerbation, her presenting chest x-ray are notable, with e/o of diffuse interstitial opacities which, per radiology read, may represent chronic changes versus interstitial edema versus atypical pneumonia. In light of these radiographic findings, differential includes potential community-acquired pneumonia, noting her recent new onset cough, interval development of leukocytosis with neutrophilic predominance.  Additionally, these radiographic findings may indicate interstitial edema associate with early volume overload either leading to development of her atrial fibrillation with RVR, or development of acute volume overload as a consequence of her atrial fibrillation with RVR.  Her presenting BNP is noted to be 760, although this is down from most recent prior value of 1100.  She is at increased risk for development of acute on chronic diastolic heart failure, particularly given that her most recent echocardiogram in April 2023 showed moderate to severe mitral regurgitation.  Will pursue further evaluation to assess for potential contributions from atypical pneumonia versus acute  on chronic diastolic heart failure, including checking procalcitonin level, trending BNP, and pursuing CTA chest noting that she  is also at increased risk for acute pulmonary embolism as she is not chronically anticoagulated in the setting of her paroxysmal atrial fibrillation.  In the meantime, we will initiate Rocephin  and doxycycline  for community-acquired pneumonia coverage.   No evidence of additional underlying infectious process at this time, including urinalysis that was inconsistent with UTI, We will COVID, influenza, and RSV PCR were all negative.  At this time, ACS felt to be less likely in the absence of any recent CP, while presenting EKG reported to show no evidence of acute ischemic changes.   In the setting of CHA2DS2-VASc score of  6, there is an indication for chronic anticoagulation for thromboembolic prophylaxis.  However, per report of the outpatient discussions with her cardiologist, she is felt to be a fall risk with associated risk versus benefits discussions leading to decision to refrain from formal chronic anticoagulation.  While she is not chronically anticoagulated at home, she is on chronic oral amiodarone  therapy.  Plan: Monitor strict I's & O's and daily weights. Monitor on telemetry. Check serum Mg level with prn supplementation to maintain levels of greater than or equal to 2.0. Repeat CMP/CBC in the AM.  Continue amiodarone  drip.  Check updated echocardiogram in the morning.  I contacted cards master, requesting cardiology consult for the morning.  Check TSH, procalcitonin level.  Repeat BMP in the morning.  CTA chest, as above.  Start Rocephin , doxycycline  pending result of CTA chest, as above. Increased risk for acute on chronic diastolic heart failure as a potential contributing factor leading to her atrial fibrillation with RVR, will also decrease rate of lactated Ringer 's from 150 to 100 cc/hr, and will follow closely for ensuing BMP trend as well as CTA chest result,  as above.                      #) Generalized weakness: 1 week duration of generalized weakness, in the absence of any evidence of acute focal neurologic deficits, including no evidence of acute focal weakness to suggest acute CVA.  Differential includes presenting atrial fibrillation with RVR, including potential community-acquired pneumonia versus acute on chronic diastolic heart failure, with additional evaluation for these possibilities currently pending in the form of CTA chest, BNP trending and procalcitonin level.  No evidence of additional underlying infection at this time, including urinalysis that was inconsistent with UTI, COVID, influenza, RSV PCR were all negative.  Will further eval for any additional contributions from endocrine/metabolic sources, as detailed below.   Plan: work-up and management of presenting atrial fibrillation with RVR, as described above.  CTA chest, procalcitonin level, as above.  For now, continue Rocephin  and doxycycline  for potential community-acquired pneumonia.  Repeat BMP in the morning.  PT/OT consults ordered for the AM. Fall precautions. CMP/CBC in the AM. Check TSH, serum Mg level.                    #) Acute hypo-osmolar hyponatremia: Presenting serum sodium of 130 compared to most recent prior value of 138 in July 2025.  He initially appears mildly dehydrated, with diminished oral intake over the course of the last week in the setting of his generalized weakness.  However, she is at increased risk for development of acute volume overload from acute on chronic diastolic heart failure, as further evaluated above, with differential also including the possibility of SIADH given chest x-ray showing evidence of diffuse bilateral interstitial opacities.  Pursuing CTA chest, procalcitonin level, and further trending of  BNP to further evaluate.  For now, will decrease rate of her lactated Ringer 's and pursue additional diagnostic  evaluation, as below.  There may also be pharmacologic contributions in the form of outpatient use of Zoloft  as well as HCTZ.  Plan: monitor strict I's and O's and daily weights.  check random urine sodium, urine osmolality.  Check serum osmolality to confirm suspected hypoosmolar etiology.  Repeat CMP in the morning. Check TSH.  Reduce rate of lactated Ringer 's 150 cc/h to 100 cc/h, with potential further reduction in these IV fluids depending upon result of ensuing CTA chest, BMP trending and procalcitonin level resolved.  Hold home HCTZ, Zoloft  for now. Check serum uric acid level, as SIADH can be associated with hypouricemia due to hyperuricuria.                      #) Essential Hypertension: documented h/o such, with outpatient antihypertensive regimen including isosorbide  mononitrate, losartan , HCTZ.  SBP's in the ED today: In the low 100s to 130s mmHg. in the setting of presenting acute hyponatremia, will hold home HCTZ for now.  Additionally, given increased risk for development of hypotension in the setting of her initial atrial fibrillation with RVR, will also hold her home isosorbide  mononitrate as well as losartan  for now.  Plan: Close monitoring of subsequent BP via routine VS. hold home antihypertensive medications for now, as above.  Monitor strict I's and O's and daily weights.  Monitor on telemetry.                      #) Hyperlipidemia: documented h/o such. On atorvastatin  as outpatient.   Plan: continue home statin.                      #) Depression: documented h/o such. On Zoloft  as outpatient.    Plan: In the setting of presenting acute hyponatremia, will hold home Zoloft .                       #) Anemia of chronic disease: Documented history of such, a/w with baseline hgb range 9-11, with presenting hgb consistent with this range, in the absence of any overt evidence of active bleed.     Plan:  Repeat CBC in the morning.     DVT prophylaxis: SCD's   Code Status: Full code Family Communication: I discussed pt's case with her daughter, who as present at bedside;  Disposition Plan: Per Rounding Team Consults called: I have contacted cards requesting cardiology consult in the morningmaster, ;  Admission status: Inpatient     I SPENT GREATER THAN 75  MINUTES IN CLINICAL CARE TIME/MEDICAL DECISION-MAKING IN COMPLETING THIS ADMISSION.      Eren Puebla B Jarnell Cordaro DO [facility]  From 7PM - 7AM   10/06/2023, 9:15 PM

## 2023-10-06 NOTE — Sepsis Progress Note (Signed)
 Code Sepsis protocol being monitored by [facility].

## 2023-10-07 ENCOUNTER — Inpatient Hospital Stay (HOSPITAL_COMMUNITY)

## 2023-10-07 ENCOUNTER — Encounter (HOSPITAL_COMMUNITY): Payer: Self-pay | Admitting: Internal Medicine

## 2023-10-07 DIAGNOSIS — J9601 Acute respiratory failure with hypoxia: Secondary | ICD-10-CM | POA: Diagnosis not present

## 2023-10-07 DIAGNOSIS — I340 Nonrheumatic mitral (valve) insufficiency: Secondary | ICD-10-CM | POA: Diagnosis not present

## 2023-10-07 DIAGNOSIS — I4891 Unspecified atrial fibrillation: Secondary | ICD-10-CM

## 2023-10-07 DIAGNOSIS — N1832 Chronic kidney disease, stage 3b: Secondary | ICD-10-CM

## 2023-10-07 DIAGNOSIS — I5031 Acute diastolic (congestive) heart failure: Secondary | ICD-10-CM

## 2023-10-07 DIAGNOSIS — Z8673 Personal history of transient ischemic attack (TIA), and cerebral infarction without residual deficits: Secondary | ICD-10-CM

## 2023-10-07 LAB — CBC WITH DIFFERENTIAL/PLATELET
Abs Immature Granulocytes: 0.05 K/uL (ref 0.00–0.07)
Basophils Absolute: 0 K/uL (ref 0.0–0.1)
Basophils Relative: 0 %
Eosinophils Absolute: 0 K/uL (ref 0.0–0.5)
Eosinophils Relative: 0 %
HCT: 29.2 % — ABNORMAL LOW (ref 36.0–46.0)
Hemoglobin: 9.5 g/dL — ABNORMAL LOW (ref 12.0–15.0)
Immature Granulocytes: 1 %
Lymphocytes Relative: 6 %
Lymphs Abs: 0.6 K/uL — ABNORMAL LOW (ref 0.7–4.0)
MCH: 29.3 pg (ref 26.0–34.0)
MCHC: 32.5 g/dL (ref 30.0–36.0)
MCV: 90.1 fL (ref 80.0–100.0)
Monocytes Absolute: 0.4 K/uL (ref 0.1–1.0)
Monocytes Relative: 4 %
Neutro Abs: 8.9 K/uL — ABNORMAL HIGH (ref 1.7–7.7)
Neutrophils Relative %: 89 %
Platelets: 135 K/uL — ABNORMAL LOW (ref 150–400)
RBC: 3.24 MIL/uL — ABNORMAL LOW (ref 3.87–5.11)
RDW: 15.2 % (ref 11.5–15.5)
WBC: 10 K/uL (ref 4.0–10.5)
nRBC: 0 % (ref 0.0–0.2)

## 2023-10-07 LAB — BRAIN NATRIURETIC PEPTIDE: B Natriuretic Peptide: 974 pg/mL — ABNORMAL HIGH (ref 0.0–100.0)

## 2023-10-07 LAB — COMPREHENSIVE METABOLIC PANEL WITH GFR
ALT: 18 U/L (ref 0–44)
AST: 24 U/L (ref 15–41)
Albumin: 3.8 g/dL (ref 3.5–5.0)
Alkaline Phosphatase: 79 U/L (ref 38–126)
Anion gap: 10 (ref 5–15)
BUN: 23 mg/dL (ref 8–23)
CO2: 21 mmol/L — ABNORMAL LOW (ref 22–32)
Calcium: 9 mg/dL (ref 8.9–10.3)
Chloride: 100 mmol/L (ref 98–111)
Creatinine, Ser: 1.45 mg/dL — ABNORMAL HIGH (ref 0.44–1.00)
GFR, Estimated: 32 mL/min — ABNORMAL LOW (ref >60)
Glucose, Bld: 118 mg/dL — ABNORMAL HIGH (ref 70–99)
Potassium: 4.4 mmol/L (ref 3.5–5.1)
Sodium: 131 mmol/L — ABNORMAL LOW (ref 135–145)
Total Bilirubin: 1.6 mg/dL — ABNORMAL HIGH (ref 0.0–1.2)
Total Protein: 6.2 g/dL — ABNORMAL LOW (ref 6.5–8.1)

## 2023-10-07 LAB — PROCALCITONIN: Procalcitonin: <0.1 ng/mL

## 2023-10-07 LAB — ECHOCARDIOGRAM COMPLETE
AR max vel: 1.01 cm2
AV Area VTI: 1.07 cm2
AV Area mean vel: 0.99 cm2
AV Mean grad: 17.7 mmHg
AV Peak grad: 31.3 mmHg
Ao pk vel: 2.8 m/s
Height: 65 in
S' Lateral: 3 cm
Single Plane A4C EF: 53.3 %
Weight: 2067.03 [oz_av]

## 2023-10-07 LAB — URINE CULTURE: Culture: NO GROWTH

## 2023-10-07 LAB — TSH: TSH: 5.258 u[IU]/mL — ABNORMAL HIGH (ref 0.350–4.500)

## 2023-10-07 LAB — OSMOLALITY: Osmolality: 279 mosm/kg (ref 275–295)

## 2023-10-07 LAB — OSMOLALITY, URINE: Osmolality, Ur: 388 mosm/kg (ref 300–900)

## 2023-10-07 LAB — SODIUM, URINE, RANDOM: Sodium, Ur: <30 mmol/L

## 2023-10-07 LAB — CREATININE, URINE, RANDOM: Creatinine, Urine: 105 mg/dL

## 2023-10-07 LAB — MAGNESIUM
Magnesium: 1.8 mg/dL (ref 1.7–2.4)
Magnesium: 1.6 mg/dL — ABNORMAL LOW (ref 1.7–2.4)

## 2023-10-07 LAB — URIC ACID: Uric Acid, Serum: 4.9 mg/dL (ref 2.5–7.1)

## 2023-10-07 LAB — VITAMIN B12: Vitamin B-12: 509 pg/mL (ref 180–914)

## 2023-10-07 LAB — MRSA NEXT GEN BY PCR, NASAL: MRSA by PCR Next Gen: NOT DETECTED

## 2023-10-07 MED ORDER — METOPROLOL TARTRATE 12.5 MG HALF TABLET
12.5000 mg | ORAL_TABLET | Freq: Four times a day (QID) | ORAL | Status: DC
  Administered 2023-10-07 – 2023-10-08 (×2): 12.5 mg via ORAL
  Filled 2023-10-07 (×2): qty 1

## 2023-10-07 MED ORDER — FUROSEMIDE 10 MG/ML IJ SOLN
20.0000 mg | Freq: Once | INTRAMUSCULAR | Status: AC
  Administered 2023-10-07: 20 mg via INTRAVENOUS
  Filled 2023-10-07: qty 2

## 2023-10-07 MED ORDER — FUROSEMIDE 10 MG/ML IJ SOLN
40.0000 mg | Freq: Two times a day (BID) | INTRAMUSCULAR | Status: DC
Start: 2023-10-07 — End: 2023-10-10
  Administered 2023-10-07 – 2023-10-10 (×6): 40 mg via INTRAVENOUS
  Filled 2023-10-07 (×6): qty 4

## 2023-10-07 MED ORDER — AMIODARONE IV BOLUS ONLY 150 MG/100ML
150.0000 mg | Freq: Once | INTRAVENOUS | Status: AC
  Administered 2023-10-07: 150 mg via INTRAVENOUS
  Filled 2023-10-07: qty 100

## 2023-10-07 MED ORDER — IOHEXOL 350 MG/ML SOLN
75.0000 mL | Freq: Once | INTRAVENOUS | Status: AC | PRN
  Administered 2023-10-07: 75 mL via INTRAVENOUS

## 2023-10-07 MED ORDER — METOPROLOL TARTRATE 12.5 MG HALF TABLET
12.5000 mg | ORAL_TABLET | Freq: Two times a day (BID) | ORAL | Status: DC
  Administered 2023-10-07: 12.5 mg via ORAL
  Filled 2023-10-07: qty 1

## 2023-10-07 MED ORDER — DILTIAZEM HCL-DEXTROSE 125-5 MG/125ML-% IV SOLN (PREMIX): 5.0000 mg/h | INTRAVENOUS | Status: DC

## 2023-10-07 MED ORDER — MAGNESIUM SULFATE 2 GM/50ML IV SOLN
2.0000 g | Freq: Once | INTRAVENOUS | Status: AC
  Administered 2023-10-07: 2 g via INTRAVENOUS
  Filled 2023-10-07: qty 50

## 2023-10-07 NOTE — TOC Initial Note (Signed)
 Transition of Care [facility]) - Initial/Assessment Note    Patient Details  Name: Sheri Hensley MRN: [id number] Date of Birth: 1919/02/19  Transition of Care [facility]) CM/SW Contact:    Lauraine FORBES Saa, LCSWA Phone Number: 10/07/2023, 3:20 PM  Clinical Narrative:                  3:20 PM CSW introduced self and role to patient's daughter, [name] (patient is not fully oriented). Vickie confirmed patient resides at home alone but has 24/7 private pay caregivers. Vickie confirmed she transports patient to appointments. Vickie confirmed patient's SNF history with [address] and [facility]. Vickie confirmed patient's HH history with [facility]. Vickie confirmed patient's DME (wheelchair, walker, rollator, BSC) history. Vickie confirmed patient's PCP and insurance. Per chart review, [phone]. TOC will continue to follow and be available to assist.  Expected Discharge Plan: Home/Self Care Barriers to Discharge: Continued Medical Work up   Patient Goals and CMS Choice            Expected Discharge Plan and Services       Living arrangements for the past 2 months: Single Family Home                                      Prior Living Arrangements/Services Living arrangements for the past 2 months: Single Family Home Lives with:: Self Patient language and need for interpreter reviewed:: Yes        Need for Family Participation in Patient Care: Yes (Comment) Care giver support system in place?: Yes (comment) Current home services: DME Criminal Activity/Legal Involvement Pertinent to Current Situation/Hospitalization: No - Comment as needed  Activities of Daily Living   ADL Screening (condition at time of admission) Independently performs ADLs?: No Does the patient have a NEW difficulty with bathing/dressing/toileting/self-feeding that is expected to last >3 days?: Yes (Initiates electronic notice to provider for  possible OT consult) Does the patient have a NEW difficulty with getting in/out of bed, walking, or climbing stairs that is expected to last >3 days?: Yes (Initiates electronic notice to provider for possible PT consult) Does the patient have a NEW difficulty with communication that is expected to last >3 days?: No Is the patient deaf or have difficulty hearing?: Yes Does the patient have difficulty seeing, even when wearing glasses/contacts?: Yes Does the patient have difficulty concentrating, remembering, or making decisions?: Yes  Permission Sought/Granted Permission sought to share information with : Family Supports Permission granted to share information with : No (Contact information on chart)  Share Information with NAME: Vickie Apple     Permission granted to share info w Relationship: Daughter  Permission granted to share info w Contact Information: [phone]  Emotional Assessment   Attitude/Demeanor/Rapport: Unable to Assess Affect (typically observed): Unable to Assess Orientation: : Oriented to Self Alcohol / Substance Use: Not Applicable Psych Involvement: No (comment)  Admission diagnosis:  Atrial fibrillation with RVR (HCC) [I48.91] Urinary tract infection without hematuria, site unspecified [N39.0] Community acquired pneumonia, unspecified laterality [J18.9] Patient Active Problem List   Diagnosis Date Noted   Atrial fibrillation with RVR (HCC) 10/06/2023   Generalized weakness 10/06/2023   History of essential hypertension 10/06/2023   Anemia of chronic disease 10/06/2023   Insomnia 04/28/2021   Depression 04/28/2021   Abnormal radiologic findings on diagnostic imaging of right testicle 02/23/2021   History of cancer of uterine  body 02/23/2021   AKI (acute kidney injury) (HCC) 08/29/2020   AF (paroxysmal atrial fibrillation) (HCC) 08/29/2020   Acute hyponatremia 08/29/2020   Closed right hip fracture (HCC) 08/28/2020   Acquired absence of both cervix and  uterus 03/11/2020   Acquired thrombophilia (HCC) 03/11/2020   Chronic kidney disease due to hypertension 03/11/2020   Constipation 03/11/2020   Frailty 03/11/2020   Herpes zoster without complication 03/11/2020   Overactive bladder 03/11/2020   Standard chest x-ray abnormal 03/11/2020   Type 2 diabetes mellitus with other specified complication (HCC) 03/11/2020   Vitamin D  deficiency 03/11/2020   Atrial fibrillation (HCC)    Bruises easily    Coronary artery disease    GERD (gastroesophageal reflux disease)    Glaucoma, both eyes    History of breast cancer    History of Clostridium difficile infection    HLD (hyperlipidemia)    Hypertension    Macular degeneration    Narrow complex tachycardia (HCC)    OA (osteoarthritis)    PMB (postmenopausal bleeding)    PONV (postoperative nausea and vomiting)    Wears glasses    Acute respiratory failure with hypoxia (HCC) 01/08/2019   COVID-19 virus infection 01/08/2019   Pneumonia due to COVID-19 virus 01/08/2019   Falls 06/2017   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   Paroxysmal atrial fibrillation (HCC) 12/23/2015   Hyponatremia 12/23/2015   Elevated bilirubin 12/23/2015   TIA (transient ischemic attack) 12/22/2015   Personal history of transient ischemic attack (TIA), and cerebral infarction without residual deficits 12/22/2015   Endometrial cancer (HCC) 11/21/2015   Postmenopausal bleeding 11/10/2015   Thickened endometrium 11/10/2015   Essential (primary) hypertension 10/24/2014   Essential hypertension 10/24/2014   Post-traumatic osteoarthritis of both knees 10/15/2013   Cancer (HCC) 1998   PCP:  Charlott Dorn LABOR, MD Pharmacy:   [phone] [place] - [address] AT [address] [facility] [address] [place] [address] Phone: [phone] Fax: [phone]     Social Drivers of Health (SDOH) Social History: SDOH Screenings   Food Insecurity: No  Food Insecurity (10/07/2023)  Housing: Low Risk  (10/07/2023)  Transportation Needs: No Transportation Needs (10/07/2023)  Utilities: Not At Risk (10/07/2023)  Social Connections: Moderately Isolated (10/07/2023)  Tobacco Use: Low Risk  (10/06/2023)   SDOH Interventions:     Readmission Risk Interventions     No data to display

## 2023-10-07 NOTE — Progress Notes (Signed)
 PROGRESS NOTE  Sheri Hensley    DOB: [birth year], 89 y.o.  FMW:[id number]    Code Status: Full Code   DOA: 10/06/2023   LOS: 1   Brief hospital course  Sheri Hensley is a 89 y.o. female with a PMH significant for paroxysmal atrial fibrillation not on anticoagulation, hyperlipidemia, chronic diastolic heart failure, moderate to severe mitral digitation, anemia of chronic disease with baseline hemoglobin 9-11, who is admitted to [facility] on 10/06/2023 with atrial fibrillation with RVR after presenting from home to [facility] ED complaining of generalized weakness. Likely exacerbated by CAP and MVR.    10/07/23 -cardiology has been consulted for Afib management. Continues on IV Abx.   Assessment & Plan  Principal Problem:   Atrial fibrillation with RVR (HCC) Active Problems:   HLD (hyperlipidemia)   Acute hyponatremia   Depression   Generalized weakness   History of essential hypertension   Anemia of chronic disease  Atrial fibrillation with RVR: difficult to manage. Still rapid rate on amiodarone  drip. Not improved with additional bolus. BNP elevated MVR: echo pending.  - cardiology consulted and increased BB.  - IV diuresis - palliative has been consulted. Patient's prognosis is guarded at this time.  - continue amiodarone  gtt   CAP- hypoxic with infiltrate developing on chest imaging. Also with significant wheezing - continue IV Abx - wean O2 as able - breathing treatments PRN    Mild hyponatremia- improving. Holding home HCTZ - BMP am   #) Hyperlipidemia:   -continue home statin.     #) Depression:  - In the setting of presenting acute hyponatremia, will hold home Zoloft .   #) Anemia of chronic disease: - monitor   Body mass index is 21.5 kg/m.  VTE ppx: SCDs Start: 10/06/23 2037  Diet:     Diet   Diet regular Room service appropriate? Yes; Fluid consistency: Thin   Consultants: Cardiology   Subjective 10/07/23    Pt reports no  complaints other than thinking that she has a log laying on her She is unable to converse at this time in a meaningful way.   Objective  Blood pressure (!) 144/101, pulse (!) 123, temperature (!) 97.5 F (36.4 C), temperature source Oral, resp. rate (!) 25, height 5' 5 (1.651 m), weight 58.6 kg, SpO2 93%.  Intake/Output Summary (Last 24 hours) at 10/07/2023 0721 Last data filed at 10/07/2023 0403 Gross per 24 hour  Intake 340.53 ml  Output 650 ml  Net -309.47 ml   Filed Weights   10/06/23 1634 10/07/23 0300  Weight: 55.8 kg 58.6 kg    Physical Exam:  General: awake, alert, disoriented  HEENT: hard of hearing  Respiratory: increased respiratory effort. Expiratory wheezing Cardiovascular: quick capillary refill, rapid and irregular  Nervous: Alert and oriented to self only. Extremities: moves all equally, trace LE edema, normal tone Skin: dry, intact, normal temperature, normal color. No rashes, lesions or ulcers on exposed skin  Labs   I have personally reviewed the following labs and imaging studies CBC    Component Value Date/Time   WBC 10.0 10/07/2023 0332   RBC 3.24 (L) 10/07/2023 0332   HGB 9.5 (L) 10/07/2023 0332   HGB 11.3 04/23/2019 1518   HCT 29.2 (L) 10/07/2023 0332   HCT 33.5 (L) 04/23/2019 1518   PLT 135 (L) 10/07/2023 0332   PLT 143 (L) 04/23/2019 1518   MCV 90.1 10/07/2023 0332   MCV 91 04/23/2019 1518   MCH 29.3 10/07/2023 0332  MCHC 32.5 10/07/2023 0332   RDW 15.2 10/07/2023 0332   RDW 13.0 04/23/2019 1518   LYMPHSABS 0.6 (L) 10/07/2023 0332   LYMPHSABS 1.2 04/23/2019 1518   MONOABS 0.4 10/07/2023 0332   EOSABS 0.0 10/07/2023 0332   EOSABS 0.2 04/23/2019 1518   BASOSABS 0.0 10/07/2023 0332   BASOSABS 0.0 04/23/2019 1518      Latest Ref Rng & Units 10/07/2023    3:32 AM 10/06/2023    6:05 PM 08/14/2023    4:45 PM  BMP  Glucose 70 - 99 mg/dL 881  890  892   BUN 8 - 23 mg/dL 23  24  19    Creatinine 0.44 - 1.00 mg/dL 8.54  8.59  8.60   Sodium 135  - 145 mmol/L 131  130  138   Potassium 3.5 - 5.1 mmol/L 4.4  4.5  4.3   Chloride 98 - 111 mmol/L 100  98  101   CO2 22 - 32 mmol/L 21  21  24    Calcium  8.9 - 10.3 mg/dL 9.0  9.1  9.5     CT Angio Chest Pulmonary Embolism (PE) W or WO Contrast Result Date: 10/07/2023 CLINICAL DATA:  Atrial fibrillation with shortness of breath and cough, initial encounter EXAM: CT ANGIOGRAPHY CHEST WITH CONTRAST TECHNIQUE: Multidetector CT imaging of the chest was performed using the standard protocol during bolus administration of intravenous contrast. Multiplanar CT image reconstructions and MIPs were obtained to evaluate the vascular anatomy. RADIATION DOSE REDUCTION: This exam was performed according to the departmental dose-optimization program which includes automated exposure control, adjustment of the mA and/or kV according to patient size and/or use of iterative reconstruction technique. CONTRAST:  75mL OMNIPAQUE  IOHEXOL  350 MG/ML SOLN COMPARISON:  Chest x-ray from earlier in the same day. FINDINGS: Cardiovascular: Atherosclerotic calcifications of the thoracic aorta are noted. No aneurysmal dilatation is seen. The degree of opacification of the aorta is limited precluding evaluation for dissection. The pulmonary artery shows a normal branching pattern bilaterally. No filling defect to suggest pulmonary embolism is noted. Mediastinum/Nodes: Thoracic inlet shows mild heterogeneity of the thyroid  without discrete sizable nodule. No hilar or mediastinal adenopathy is noted. The esophagus as visualized is within normal limits. Lungs/Pleura: Small pleural effusions are noted bilaterally. Mild bibasilar atelectasis is seen. No focal confluent infiltrate is noted. The changes seen on recent plain film are not borne out on this exam. Mild changes of air trapping are noted. Parenchymal scarring is noted in the right upper lobe. Upper Abdomen: Visualized upper abdomen is within normal limits. Musculoskeletal: Degenerative  changes of the thoracic spine are noted. No acute rib abnormality is noted. Review of the MIP images confirms the above findings. IMPRESSION: No evidence of pulmonary embolism. Increasing airspace opacity in the right upper lobe is not borne out on this exam. Small bilateral pleural effusions with basilar atelectasis. Aortic Atherosclerosis (ICD10-I70.0). Electronically Signed   By: Oneil Devonshire M.D.   On: 10/07/2023 01:19   DG Chest Port 1 View Result Date: 10/07/2023 CLINICAL DATA:  Shortness of breath EXAM: PORTABLE CHEST 1 VIEW COMPARISON:  10/06/2023 FINDINGS: Cardiac shadow is stable. Lungs are well aerated bilaterally. Some increasing airspace opacity in the right upper lobe is noted which may represent developing infiltrate. No sizable effusion is seen. No bony abnormality is noted. IMPRESSION: Increasing right upper lobe airspace opacity consistent with developing infiltrate. Electronically Signed   By: Oneil Devonshire M.D.   On: 10/07/2023 00:38   DG Chest Port 1 View Result Date:  10/06/2023 CLINICAL DATA:  Question sepsis. EXAM: PORTABLE CHEST 1 VIEW COMPARISON:  Chest radiograph dated 07/23/2023. FINDINGS: There is diffuse chronic interstitial coarsening. Faint diffuse interstitial densities may represent chronic changes, edema, or atypical pneumonia. No focal consolidation, pleural effusion or pneumothorax. Stable cardiac silhouette. No acute osseous pathology. IMPRESSION: Chronic interstitial coarsening. No focal consolidation. Electronically Signed   By: Vanetta Chou M.D.   On: 10/06/2023 18:54    Disposition Plan & Communication  Patient status: Inpatient  Admitted From: Home Planned disposition location: TBD Anticipated discharge date: TBD pending clinical course   Family Communication: daughter and granddaughter at bedside     Author: Marien LITTIE Piety, DO [facility] 10/07/2023, 7:21 AM   Available by Epic secure chat 7AM-7PM. If 7PM-7AM, please contact  night-coverage.  [facility] contact information found on [email or web address].

## 2023-10-07 NOTE — Progress Notes (Signed)
 We discussed her overall clinical state with daughter and other family members.  Sheri Hensley is admitted with acute diastolic heart failure and likely worsening mitral valve regurgitation.  She has had rather difficult to control atrial fibrillation with RVR.  Urine output is suboptimal.  We discussed that she may have pneumonia as well but I am more concerning about worsening heart failure.  Given her advanced age and multiple comorbidities she is not a candidate for aggressive cardiovascular care.  I am a bit worried about her further decompensation.  We discussed that we can continue with Lasix  and rate control measures but she has very limited options.  They do understand this.  We did discuss CODE STATUS and they agree that she should be DNR with limited scope of care.  She would not benefit from chest compressions or mechanical ventilation.  They are in agreement with this plan.  The patient is quite confused and unable to make this decision.  Family at the bedside including daughter have helped to make this decision.  For now we will continue with diuresis and current treatment plan.  They will have palliative care see her tomorrow.  If she does not improve they are in agreement that hospice care is likely appropriate.  Signed, Darryle DASEN. Barbaraann, MD, [facility]  [facility]  [address] [place], [place] [address] [phone]  5:13 PM

## 2023-10-07 NOTE — TOC Progression Note (Signed)
 Transition of Care [facility]) - Progression Note    Patient Details  Name: Sheri Hensley MRN: [id number] Date of Birth: 29-Oct-1919  Transition of Care [facility]) CM/SW Contact  Roxie KANDICE Stain, RN Phone Number: 10/07/2023, 4:24 PM  Clinical Narrative:    Beatris to [name], daughter, regarding possible residential hospice.  Vickie would like for patient to go to [facility] if Hospice is the finally choice.  ICM (Inpatient Care Management) will continue to follow for needs.   Expected Discharge Plan: Home/Self Care Barriers to Discharge: Continued Medical Work up               Expected Discharge Plan and Services       Living arrangements for the past 2 months: Single Family Home                                       Social Drivers of Health (SDOH) Interventions SDOH Screenings   Food Insecurity: No Food Insecurity (10/07/2023)  Housing: Low Risk  (10/07/2023)  Transportation Needs: No Transportation Needs (10/07/2023)  Utilities: Not At Risk (10/07/2023)  Social Connections: Moderately Isolated (10/07/2023)  Tobacco Use: Low Risk  (10/06/2023)    Readmission Risk Interventions     No data to display

## 2023-10-07 NOTE — Consult Note (Signed)
 Cardiology Consultation   Patient ID: Sheri Hensley MRN: [id number]; DOB: 02/07/1919  Admit date: 10/06/2023 Date of Consult: 10/07/2023  PCP:  Sheri Hensley LABOR, MD   [facility] Providers Cardiologist:  Sheri JONELLE Crape, MD      Patient Profile: Sheri Hensley is a 89 y.o. female with a hx of PAF not on OAC but on amiodarone , CAD, HLD, HFpEF, moderate to severe MR, anemia of chronic disease,  who is being seen 10/07/2023 for the evaluation of Afib RVR at the request of Dr. Lenon.  History of Present Illness:  [name] carries a history of TIA and PAF from 2017. She has been on amiodarone  but not on OAC due to gait instability. She ambulates with a walker, daughter lives next door. She lives at home and takes care of daily living. Last echo 04/2021 with LVEF 60-65%, grade 1 DD, mild AI, moderate LAE, moderate to severe MR. Last seen by Dr. Crape 02/2022, she was doing well at that time, BP was 142/70.   She presented to [facility] with 1 week of weakness and cough found to be in Afib RVR with Urgent Care and sent to ER. She reports SOB with progressive symptoms. Heart rates initially in the 140s. She also reported dysuria in the setting of recurrent UTIs including history of pseudomonas.   Mg 1.6 BNP 974 Urine culture pending Blood cultures pending TSH 5.258 sCr 1.40 WBC 10.8 --> 10.0 Hb 9.5 (baseline)  CXR concerning for developing infiltrates.  CTA negative for PE but concern for   She was started on IV amiodarone , received 20 mg IV lasix , and started on ABX for pneumonia.   Cardiology consulted for management of Afib RVR.  During my interview, she is somewhat confused and pulling at PIV. She did wake up and answered questions appropriately. She feels weak and unwell.   O2 borderline at 89-91% while I was in the room, pt has been struggling to keep Sheri Hensley O2 in place. She was combative last night.    Past Medical History:  Diagnosis Date    Abnormal radiologic findings on diagnostic imaging of right testicle 02/23/2021   Acquired absence of both cervix and uterus 03/11/2020   Acquired thrombophilia (HCC) 03/11/2020   Acute respiratory failure with hypoxia (HCC) 01/08/2019   AF (paroxysmal atrial fibrillation) (HCC) 08/29/2020   AKI (acute kidney injury) (HCC) 08/29/2020   Atrial fibrillation (HCC)    Bruises easily    Cancer (HCC) 1998   breast and uterine   Chronic kidney disease due to hypertension 03/11/2020   Closed right hip fracture (HCC) 08/28/2020   Constipation 03/11/2020   Coronary artery disease    CARDIOLOGIST-  DR Hensley ([facility])   COVID-19 virus infection 01/08/2019   Elevated bilirubin 12/23/2015   Endometrial cancer (HCC)    Endometrial cancer (HCC)    Essential (primary) hypertension 10/24/2014   Essential hypertension 10/24/2014   Falls 06/2017   Patient fell and hit her head , patient's daughter's states patient may have fell on the kitchen counter   Frailty 03/11/2020   GERD (gastroesophageal reflux disease)    Glaucoma, both eyes    Herpes zoster without complication 03/11/2020   History of breast cancer    1998--  s/p  left mastectomy with sln dissection's  ,  no chemo or radiation-  no recurrence   History of cancer of uterine body 02/23/2021   History of Clostridium difficile infection 5-6 yrs ago   Hyperlipidemia  Hypertension    Hyponatremia 12/23/2015   Insomnia 04/28/2021   Macular degeneration    Major depression 04/28/2021   Narrow complex tachycardia [facility])    cardiologist-  dr elfredia   OA (osteoarthritis)    right knee   Overactive bladder 03/11/2020   Paroxysmal atrial fibrillation (HCC) 12/23/2015   Personal history of transient ischemic attack (TIA), and cerebral infarction without residual deficits 12/22/2015   yrs ago ? 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   Standard chest x-ray abnormal 03/11/2020   Thickened endometrium    TIA (transient ischemic attack) 12/22/2015   Type 2 diabetes mellitus with other specified complication (HCC) 03/11/2020   Vitamin D  deficiency 03/11/2020   Wears glasses     Past Surgical History:  Procedure Laterality Date   APPENDECTOMY  1954   CATARACT EXTRACTION W/ INTRAOCULAR LENS  IMPLANT, BILATERAL  1994   colonscopy     DILATION AND CURETTAGE OF UTERUS N/A 11/20/2015   Procedure: DILATATION AND CURETTAGE;  Surgeon: Maurilio Ship, MD;  Location: [facility];  Service: Gynecology;  Laterality: N/A;   DILATION AND CURETTAGE, DIAGNOSTIC / THERAPEUTIC N/A novmber 2017   per daughter with biopsy as stated   KNEE ARTHROSCOPY Right 2002   MASTECTOMY Left 1998   w/ lympn node dissection's   ROBOTIC ASSISTED TOTAL HYSTERECTOMY WITH BILATERAL SALPINGO OOPHERECTOMY Bilateral 01/27/2016   Procedure: XI ROBOTIC ASSISTED TOTAL HYSTERECTOMY WITH BILATERAL SALPINGO OOPHORECTOMY;  Surgeon: Maurilio Ship, MD;  Location: [facility];  Service: Gynecology;  Laterality: Bilateral;   TOTAL HIP ARTHROPLASTY Right 08/30/2020   Procedure: HEMI  HIP ARTHROPLASTY ANTERIOR APPROACH;  Surgeon: Fidel Rogue, MD;  Location: [facility] OR;  Service: Orthopedics;  Laterality: Right;     Home Medications:  Prior to Admission medications   Medication Sig Start Date End Date Taking? Authorizing Provider  acetaminophen  (TYLENOL ) 500 MG tablet Take 500-1,000 mg by mouth every 6 (six) hours as needed for mild pain (pain score 1-3) or headache.   Yes [provider]  amiodarone  (PACERONE ) 200 MG tablet TAKE 0.5 TABLETS (100 MG TOTAL) BY MOUTH EVERY  MORNING. 02/04/23  Yes Revankar, Sheri SAUNDERS, MD  amLODipine  (NORVASC ) 2.5 MG tablet Take 2.5 mg by mouth daily. 09/18/23  Yes [provider]  atorvastatin  (LIPITOR) 10 MG tablet Take 1 tablet (10 mg total) by mouth at bedtime. 03/29/17  Yes Revankar, Sheri SAUNDERS, MD  brimonidine  (ALPHAGAN ) 0.2 % ophthalmic solution Place 1 drop into both eyes 2 (two) times daily. 06/29/23  Yes [provider]  Cholecalciferol  (VITAMIN D ) 50 MCG (2000 UT) tablet Take 2,000 Units by mouth daily.   Yes [provider]  clopidogrel  (PLAVIX ) 75 MG tablet Take 1 tablet (75 mg total) by mouth daily. 12/24/15  Yes Ghimire, Donalda HERO, MD  estradiol (ESTRACE) 0.1 MG/GM vaginal cream Place 1 Applicatorful vaginally every morning. Apply a pea-size amount to the outside of the vagina and inside. 09/11/23  Yes [provider]  latanoprost  (XALATAN ) 0.005 % ophthalmic solution Place 1 drop into both eyes at bedtime. 06/06/23  Yes [provider]  loratadine  (CLARITIN ) 10 MG tablet  Take 10 mg by mouth at bedtime.   Yes [provider]  losartan  (COZAAR ) 25 MG tablet Take 25 mg by mouth daily. 08/08/23  Yes [provider]  melatonin 5 MG TABS Take 5 mg by mouth at bedtime.   Yes [provider]  metoprolol  tartrate (LOPRESSOR ) 25 MG tablet Take 12.5 mg by mouth daily. 09/25/23  Yes [provider]  Multiple Vitamins-Minerals (CENTRUM VITAMINTS PO) Take 1 tablet by mouth every morning. Centrum Silver   Yes [provider]  Multiple Vitamins-Minerals (PRESERVISION AREDS 2) CAPS Take 1 capsule by mouth 2 (two) times daily after a meal.   Yes [provider]  ondansetron  (ZOFRAN ) 4 MG tablet Take 4 mg by mouth every 8 (eight) hours as needed for nausea/vomiting. 07/31/20  Yes [provider]  pantoprazole  (PROTONIX ) 40 MG tablet Take 40 mg by mouth every morning. 08/09/20  Yes [provider]  Probiotic Product (PROBIOTIC PO) Take 1  capsule by mouth daily in the afternoon.   Yes [provider]  QUEtiapine (SEROQUEL) 25 MG tablet Take 25 mg by mouth at bedtime. 08/24/23  Yes [provider]  sertraline  (ZOLOFT ) 50 MG tablet Take 50 mg by mouth daily.   Yes [provider]  vitamin B-12 (CYANOCOBALAMIN ) 1000 MCG tablet Take 1,000 mcg by mouth daily.   Yes [provider]  ferrous sulfate ER (SLOW FE) 142 (45 Fe) MG TBCR tablet Take 45 mg by mouth in the morning. Patient not taking: Reported on 10/06/2023 09/12/23   [provider]    Scheduled Meds:  atorvastatin   10 mg Oral QHS   clopidogrel   75 mg Oral Daily   pantoprazole   40 mg Oral q morning   Continuous Infusions:  amiodarone  Stopped (10/07/23 0759)   cefTRIAXone  (ROCEPHIN )  IV     doxycycline  (VIBRAMYCIN ) IV 100 mg (10/07/23 0907)   PRN Meds: acetaminophen  **OR** acetaminophen , haloperidol  lactate, levalbuterol , melatonin, ondansetron  (ZOFRAN ) IV  Allergies:    Allergies  Allergen Reactions   Ativan  [Lorazepam ] Other (See Comments)    HALLUCINATIONS   Lorazepam  Other (See Comments)    Hallucinations   Metronidazole Rash    Other reaction(s): rash    Social History:   Social History   Socioeconomic History   Marital status: Widowed    Spouse name: Not on file   Number of children: 1   Years of education: Not on file   Highest education level: Not on file  Occupational History   Not on file  Tobacco Use   Smoking status: Never   Smokeless tobacco: Never  Vaping Use   Vaping status: Never Used  Substance and Sexual Activity   Alcohol use: Never    Comment: occasional   Drug use: Never   Sexual activity: Never  Other Topics Concern   Not on file  Social History Narrative   ** Merged History Encounter **       Social Drivers of Health   Financial Resource Strain: Not on file  Food Insecurity: No Food Insecurity (10/07/2023)   Hunger Vital Sign    Worried About Running Out of Food in the  Last Year: Never true    Ran Out of Food in the Last Year: Never true  Transportation Needs: No Transportation Needs (10/07/2023)   PRAPARE - Administrator, Civil Service (Medical): No    Lack of Transportation (Non-Medical): No  Physical Activity: Not on file  Stress: Not on file  Social Connections:  Moderately Isolated (10/07/2023)   Social Connection and Isolation Panel    Frequency of Communication with Friends and Family: More than three times a week    Frequency of Social Gatherings with Friends and Family: More than three times a week    Attends Religious Services: 1 to 4 times per year    Active Member of [employer] or Organizations: No    Attends Banker Meetings: Never    Marital Status: Widowed  Intimate Partner Violence: Not At Risk (10/07/2023)   Humiliation, Afraid, Rape, and Kick questionnaire    Fear of Current or Ex-Partner: No    Emotionally Abused: No    Physically Abused: No    Sexually Abused: No    Family History:    Family History  Problem Relation Age of Onset   Hypertension Mother    Hypertension Father    Liver cancer Brother    Colon cancer Brother      ROS:  Please see the history of present illness.   All other ROS reviewed and negative.     Physical Exam/Data: Vitals:   10/07/23 0141 10/07/23 0259 10/07/23 0300 10/07/23 0738  BP:  (!) 144/101  106/76  Pulse:  (!) 123  (!) 138  Resp:  (!) 25  (!) 24  Temp: 97.6 F (36.4 C) (!) 97.5 F (36.4 C)  97.9 F (36.6 C)  TempSrc:  Oral  Oral  SpO2:    90%  Weight:   58.6 kg   Height:   5' 5 (1.651 m)     Intake/Output Summary (Last 24 hours) at 10/07/2023 1005 Last data filed at 10/07/2023 0803 Gross per 24 hour  Intake 725.44 ml  Output 650 ml  Net 75.44 ml      10/07/2023    3:00 AM 10/06/2023    4:34 PM 07/18/2023    6:43 AM  Last 3 Weights  Weight (lbs) 129 lb 3 oz 123 lb 132 lb 4.4 oz  Weight (kg) 58.6 kg 55.792 kg 60 kg     Body mass index is 21.5 kg/m.   General:  elderly female, NAD HEENT: normal Neck: no JVD Vascular: No carotid bruits; Distal pulses 2+ bilaterally Cardiac:  irregular rhythm, tachycardic rate Lungs:  wheezing in upper respiratory tract, diminished in bases Abd: soft, nontender, no hepatomegaly  Ext: no edema Musculoskeletal:  No deformities, BUE and BLE strength normal and equal Skin: warm and dry  Neuro:  CNs 2-12 intact, no focal abnormalities noted Psych:  Normal affect   EKG:  The EKG is not in muse, will repeat Telemetry:  Telemetry was personally reviewed and demonstrates:  Afib with rates in the 140s --> 120s in the room  Relevant CV Studies:  Echo pending   Echo 2023:  1. Left ventricular ejection fraction, by estimation, is 60 to 65%. The  left ventricle has normal function. The left ventricle has no regional  wall motion abnormalities. Left ventricular diastolic parameters are  consistent with Grade I diastolic  dysfunction (impaired relaxation).   2. Moderate to severe mitral valve regurgitation. No evidence of mitral  stenosis.   3. The aortic valve was not well visualized. Aortic valve regurgitation  is mild. Mild aortic valve stenosis.   4. Left atrial size was moderately dilated.   Laboratory Data: High Sensitivity Troponin:  No results for input(s): TROPONINIHS in the last 720 hours.   Chemistry Recent Labs  Lab 10/06/23 1805 10/07/23 0332  NA 130* 131*  K 4.5 4.4  CL 98 100  CO2 21* 21*  GLUCOSE 109* 118*  BUN 24* 23  CREATININE 1.40* 1.45*  CALCIUM  9.1 9.0  MG 1.8 1.6*  GFRNONAA 33* 32*  ANIONGAP 11 10    Recent Labs  Lab 10/06/23 1805 10/07/23 0332  PROT 6.6 6.2*  ALBUMIN 4.0 3.8  AST 21 24  ALT 16 18  ALKPHOS 73 79  BILITOT 1.2 1.6*   Lipids No results for input(s): CHOL, TRIG, HDL, LABVLDL, LDLCALC, CHOLHDL in the last 168 hours.  Hematology Recent Labs  Lab 10/06/23 1805 10/07/23 0332  WBC 10.8* 10.0  RBC 3.48* 3.24*  HGB 10.1* 9.5*  HCT  32.0* 29.2*  MCV 92.0 90.1  MCH 29.0 29.3  MCHC 31.6 32.5  RDW 15.2 15.2  PLT 156 135*   Thyroid   Recent Labs  Lab 10/06/23 1805  TSH 5.258*    BNP Recent Labs  Lab 10/06/23 1806 10/07/23 0332  BNP 762.4* 974.0*    DDimer No results for input(s): DDIMER in the last 168 hours.  Radiology/Studies:  CT Angio Chest Pulmonary Embolism (PE) W or WO Contrast Result Date: 10/07/2023 CLINICAL DATA:  Atrial fibrillation with shortness of breath and cough, initial encounter EXAM: CT ANGIOGRAPHY CHEST WITH CONTRAST TECHNIQUE: Multidetector CT imaging of the chest was performed using the standard protocol during bolus administration of intravenous contrast. Multiplanar CT image reconstructions and MIPs were obtained to evaluate the vascular anatomy. RADIATION DOSE REDUCTION: This exam was performed according to the departmental dose-optimization program which includes automated exposure control, adjustment of the mA and/or kV according to patient size and/or use of iterative reconstruction technique. CONTRAST:  75mL OMNIPAQUE  IOHEXOL  350 MG/ML SOLN COMPARISON:  Chest x-ray from earlier in the same day. FINDINGS: Cardiovascular: Atherosclerotic calcifications of the thoracic aorta are noted. No aneurysmal dilatation is seen. The degree of opacification of the aorta is limited precluding evaluation for dissection. The pulmonary artery shows a normal branching pattern bilaterally. No filling defect to suggest pulmonary embolism is noted. Mediastinum/Nodes: Thoracic inlet shows mild heterogeneity of the thyroid  without discrete sizable nodule. No hilar or mediastinal adenopathy is noted. The esophagus as visualized is within normal limits. Lungs/Pleura: Small pleural effusions are noted bilaterally. Mild bibasilar atelectasis is seen. No focal confluent infiltrate is noted. The changes seen on recent plain film are not borne out on this exam. Mild changes of air trapping are noted. Parenchymal scarring is  noted in the right upper lobe. Upper Abdomen: Visualized upper abdomen is within normal limits. Musculoskeletal: Degenerative changes of the thoracic spine are noted. No acute rib abnormality is noted. Review of the MIP images confirms the above findings. IMPRESSION: No evidence of pulmonary embolism. Increasing airspace opacity in the right upper lobe is not borne out on this exam. Small bilateral pleural effusions with basilar atelectasis. Aortic Atherosclerosis (ICD10-I70.0). Electronically Signed   By: Oneil Devonshire M.D.   On: 10/07/2023 01:19   DG Chest Port 1 View Result Date: 10/07/2023 CLINICAL DATA:  Shortness of breath EXAM: PORTABLE CHEST 1 VIEW COMPARISON:  10/06/2023 FINDINGS: Cardiac shadow is stable. Lungs are well aerated bilaterally. Some increasing airspace opacity in the right upper lobe is noted which may represent developing infiltrate. No sizable effusion is seen. No bony abnormality is noted. IMPRESSION: Increasing right upper lobe airspace opacity consistent with developing infiltrate. Electronically Signed   By: Oneil Devonshire M.D.   On: 10/07/2023 00:38   DG Chest Port 1 View Result Date: 10/06/2023 CLINICAL DATA:  Question sepsis. EXAM: PORTABLE  CHEST 1 VIEW COMPARISON:  Chest radiograph dated 07/23/2023. FINDINGS: There is diffuse chronic interstitial coarsening. Faint diffuse interstitial densities may represent chronic changes, edema, or atypical pneumonia. No focal consolidation, pleural effusion or pneumothorax. Stable cardiac silhouette. No acute osseous pathology. IMPRESSION: Chronic interstitial coarsening. No focal consolidation. Electronically Signed   By: Vanetta Chou M.D.   On: 10/06/2023 18:54     Assessment and Plan:  Afib RVR PAF - has been maintained on amiodarone  PO at home - currently rates in the 120-140s - has been started on IV amiodarone  with bolus and infusion - rates not controlled - will add home low dose BB, to attempt rate control in the  setting of possible UTI (started on ABX) - will not add digoxin  given CrCl of 18 - suspect this ir related to ?PNA and RVR will improve as infection improves   Hx of TIA - not anticoagulated due to gait instability - has been on plavix  - per family, she has had TIA when plavix  held   Chronic anticoagulation - has not been on OAC at home due to plavix  and gait instability - not currently on heparin  gtt   HFpEF - received 20 mg IV lasix  - not significantly volume up - echo pending, hoping for better rate control - hold further lasix  for now   Hypertension - holding home losartan  and amlodipine  for BP room to achieve rate control     Risk Assessment/Risk Scores:       CHA2DS2-VASc Score = 7   This indicates a 11.2% annual risk of stroke. The patient's score is based upon: CHF History: 0 HTN History: 1 Diabetes History: 0 Stroke History: 2 Vascular Disease History: 1 Age Score: 2 Gender Score: 1      For questions or updates, please contact [facility] Please consult [email or web address] for contact info under      Signed, Jon Nat Hails, PA  10/07/2023 10:05 AM

## 2023-10-07 NOTE — Progress Notes (Signed)
 Patient with hypotension at 2200 of 81/61 map 68. Cuff adjusted and retaken and, again, 81/65 map of 72. At 2245 BP 78/61 map of 68.   Patient in afib with RVR, on amio drip, HR 110s, a mild improvement in trend, and on room air saturating at 94-97%. Patient is sleeping peacefully, appears comfortable. No other new symptoms. Family is happy with patient's appearance and comfort level, relieved that she is getting rest.  Dr. Charlton paged at 2250 re hypotension and above. Advised continue same plan for now and notify if maps under 65. Explained this to the family, and they are satisfied with this plan.   Also spoke with Mindy, RN, rapid response to notify of patient's status and to add to radar list in case condition would deteriorate.

## 2023-10-07 NOTE — Progress Notes (Signed)
  Echocardiogram 2D Echocardiogram has been performed.  Koleen KANDICE Popper, RDCS 10/07/2023, 12:25 PM

## 2023-10-07 NOTE — Plan of Care (Signed)
  Problem: Education: Goal: Knowledge of General Education information will improve Description: Including pain rating scale, medication(s)/side effects and non-pharmacologic comfort measures Outcome: Progressing   Problem: Activity: Goal: Risk for activity intolerance will decrease Outcome: Progressing   Problem: Coping: Goal: Level of anxiety will decrease Outcome: Progressing   Problem: Elimination: Goal: Will not experience complications related to urinary retention Outcome: Progressing   Problem: Skin Integrity: Goal: Risk for impaired skin integrity will decrease Outcome: Progressing

## 2023-10-07 NOTE — Progress Notes (Addendum)
 PT Cancellation Note  Patient Details Name: Sheri Hensley MRN: [id number] DOB: 06-Sep-1919   Cancelled Treatment:    Reason Eval/Treat Not Completed: Medical issues which prohibited therapy  Resting HR in the 130s when I checked in on her a couple of times this morning. Will check back this afternoon to see if rate controlled a bit better before completing comprehensive PT evaluation. Please reach out via secure chat if more urgent evaluation needed and rate stable.  1616: Spoke with RN. Requested hold this afternoon. Will attempt again tomorrow as appropriate.   Leontine Roads, PT, DPT [facility]  Rehabilitation Services Physical Therapist Office: [phone] Website: [email or web address]   Leontine GORMAN Roads 10/07/2023, 10:27 AM

## 2023-10-07 NOTE — Progress Notes (Signed)
   10/07/23 0002  Assess: MEWS Score  Temp 98.3 F (36.8 C)  BP (!) 133/96  MAP (mmHg) 105  Pulse Rate (!) 119  ECG Heart Rate (!) 119  Resp (!) 30  SpO2 93 %  Assess: MEWS Score  MEWS Temp 0  MEWS Systolic 0  MEWS Pulse 2  MEWS RR 2  MEWS LOC 0  MEWS Score 4  MEWS Score Color Red  Assess: if the MEWS score is Yellow or Red  Were vital signs accurate and taken at a resting state? Yes  Does the patient meet 2 or more of the SIRS criteria? Yes  Does the patient have a confirmed or suspected source of infection? No  MEWS guidelines implemented  Yes, red  Treat  MEWS Interventions Considered administering scheduled or prn medications/treatments as ordered  Take Vital Signs  Increase Vital Sign Frequency  Red: Q1hr x2, continue Q4hrs until patient remains green for 12hrs  Escalate  MEWS: Escalate Red: Discuss with charge nurse and notify provider. Consider notifying RRT. If remains red for 2 hours consider need for higher level of care  Notify: Charge Nurse/RN  Name of Charge Nurse/RN Notified [place], RN  Provider Notification  Provider Name/Title Dr. Marcene  Date Provider Notified 10/07/23  Time Provider Notified 0026  Method of Notification Page  Notification Reason Change in status (MEWS)  Provider response See new orders  Date of Provider Response 10/07/23  Time of Provider Response 0035  Assess: SIRS CRITERIA  SIRS Temperature  0  SIRS Respirations  1  SIRS Pulse 1  SIRS WBC 0  SIRS Score Sum  2   Patient arrived to floor from ED for admission tonight around 2200, confused, agitated, physically and verbally aggressive, very mild intermittent audible wheezing was noted. No respiratory distress, in a fib with RVR on amiodarone  drip and LR infusion.  At 2330, consistent audible wheezing noted and patient appeared to be working a bit harder to breath. Breathing treatments prn ordered by Dr. Marcene and given to patient. No improvement was noted, consistent audible  wheezing continued, RR trending up. RT called to assess. Came to bedside ? Fluid/heart related. MEWs turned red about this time.   Red MEWs implemented and Dr. Marcene paged with update and MEWs alert. CXR ordered, CT angio ordered, Lasix  dose ordered. See orders for further details.

## 2023-10-08 DIAGNOSIS — I4891 Unspecified atrial fibrillation: Secondary | ICD-10-CM | POA: Diagnosis not present

## 2023-10-08 DIAGNOSIS — Z515 Encounter for palliative care: Secondary | ICD-10-CM

## 2023-10-08 LAB — BASIC METABOLIC PANEL WITH GFR
Anion gap: 15 (ref 5–15)
BUN: 25 mg/dL — ABNORMAL HIGH (ref 8–23)
CO2: 20 mmol/L — ABNORMAL LOW (ref 22–32)
Calcium: 8.9 mg/dL (ref 8.9–10.3)
Chloride: 98 mmol/L (ref 98–111)
Creatinine, Ser: 1.71 mg/dL — ABNORMAL HIGH (ref 0.44–1.00)
GFR, Estimated: 26 mL/min — ABNORMAL LOW (ref >60)
Glucose, Bld: 124 mg/dL — ABNORMAL HIGH (ref 70–99)
Potassium: 3.9 mmol/L (ref 3.5–5.1)
Sodium: 133 mmol/L — ABNORMAL LOW (ref 135–145)

## 2023-10-08 LAB — CBC
HCT: 28.8 % — ABNORMAL LOW (ref 36.0–46.0)
Hemoglobin: 9.4 g/dL — ABNORMAL LOW (ref 12.0–15.0)
MCH: 29.3 pg (ref 26.0–34.0)
MCHC: 32.6 g/dL (ref 30.0–36.0)
MCV: 89.7 fL (ref 80.0–100.0)
Platelets: 128 K/uL — ABNORMAL LOW (ref 150–400)
RBC: 3.21 MIL/uL — ABNORMAL LOW (ref 3.87–5.11)
RDW: 15.6 % — ABNORMAL HIGH (ref 11.5–15.5)
WBC: 7.7 K/uL (ref 4.0–10.5)
nRBC: 0 % (ref 0.0–0.2)

## 2023-10-08 MED ORDER — DOXYCYCLINE HYCLATE 100 MG PO TABS
100.0000 mg | ORAL_TABLET | Freq: Two times a day (BID) | ORAL | Status: DC
  Administered 2023-10-08 – 2023-10-09 (×2): 100 mg via ORAL
  Filled 2023-10-08 (×2): qty 1

## 2023-10-08 MED ORDER — METOPROLOL TARTRATE 25 MG PO TABS
25.0000 mg | ORAL_TABLET | Freq: Four times a day (QID) | ORAL | Status: DC
  Administered 2023-10-08 – 2023-10-09 (×4): 25 mg via ORAL
  Filled 2023-10-08 (×5): qty 1

## 2023-10-08 NOTE — Progress Notes (Signed)
 OT Cancellation Note  Patient Details Name: KIRAN LAPINE MRN: [id number] DOB: [birth year]   Cancelled Treatment:    Reason Eval/Treat Not Completed: Other (comment). Per RN, pt and family awaiting Palliative consult. Family requesting to hold OT eval for day. Will continue to follow.   Nillie Bartolotta C, OT  Acute Rehabilitation Services Office [phone] Secure chat preferred   Adrianne GORMAN Savers 10/08/2023, 1:09 PM

## 2023-10-08 NOTE — Progress Notes (Signed)
 [facility] Nurse Liaison Note  Received notification from Eileen Bathe, RN/TOC that family would like information on Hospice InPatient Unit.  I called and spoke with patient's daughter, [name] Apple to initiate education related to Hospice Medicare Benefit, philosophy of care.  Vickie would like to have hospital liaison come to the hospital to meet with the family.    Notified Nat Babe, RN, [facility] Liaison covering tomorrow.  She will reach out to Vickie to schedule a meeting at some point in the morning.  Information was communicated with Eileen Bathe, [facility] and hospital medical care team.  Thank you for allowing participation in this patient's care.  Saddie HILARIO Na, MA, BSN, RN, FNE Clinical Nurse Liaison [phone]

## 2023-10-08 NOTE — Plan of Care (Signed)
  Problem: Education: Goal: Knowledge of General Education information will improve Description: Including pain rating scale, medication(s)/side effects and non-pharmacologic comfort measures Outcome: Progressing   Problem: Clinical Measurements: Goal: Respiratory complications will improve Outcome: Progressing   Problem: Activity: Goal: Risk for activity intolerance will decrease Outcome: Progressing   Problem: Coping: Goal: Level of anxiety will decrease Outcome: Progressing   Problem: Skin Integrity: Goal: Risk for impaired skin integrity will decrease Outcome: Progressing

## 2023-10-08 NOTE — Progress Notes (Signed)
 PT Cancellation Note  Patient Details Name: Sheri Hensley MRN: [id number] DOB: 1919-08-09   Cancelled Treatment:    Reason Eval/Treat Not Completed: Other (comment) (Family asked to hold at this time due to agitation. Will continue to follow up as able and appropriate.)  Sheri Hensley, DPT, CLT  Acute Rehabilitation Services Office: [phone] (Secure chat preferred)   Sheri Hensley 10/08/2023, 1:09 PM

## 2023-10-08 NOTE — Consult Note (Signed)
 Palliative Medicine  Name: Sheri Hensley Date: 10/08/2023 MRN: [id number]  DOB: Aug 16, 1919  Patient Care Team: Charlott Dorn LABOR, MD as PCP - General (Internal Medicine) Revankar, Jennifer SAUNDERS, MD as PCP - Cardiology (Cardiology) Delice Charleston, MD (Inactive) (Internal Medicine)    REASON FOR CONSULTATION: Sheri Hensley is a 89 y.o. female with multiple medical problems including A-fib not on anticoagulation, diastolic dysfunction, moderate to severe valvular heart disease, anemia of chronic disease, who was admitted the hospital on 10/06/2023 with A-fib with RVR.  Palliative care was consulted to address goals.  SOCIAL HISTORY:     reports that she has never smoked. She has never used smokeless tobacco. She reports that she does not drink alcohol  and does not use drugs.  Patient is widowed.  Lives at home with 24/7 caregivers.  Patient's daughter lives next-door.  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.    ADVANCE DIRECTIVES:  Not on file  CODE STATUS: DNR  PAST MEDICAL HISTORY: Past Medical History:  Diagnosis Date   Abnormal radiologic findings on diagnostic imaging of right testicle 02/23/2021   Acquired absence of both cervix and uterus 03/11/2020   Acquired thrombophilia (HCC) 03/11/2020   Acute respiratory failure with hypoxia (HCC) 01/08/2019   AF (paroxysmal atrial fibrillation) (HCC) 08/29/2020   AKI (acute kidney injury) (HCC) 08/29/2020   Atrial fibrillation (HCC)    Bruises easily    Cancer (HCC) 1998   breast and uterine   Chronic kidney disease due to hypertension 03/11/2020   Closed right hip fracture (HCC) 08/28/2020   Constipation 03/11/2020   Coronary artery disease    CARDIOLOGIST-  DR EDWYNA ([facility])   COVID-19 virus infection 01/08/2019   Elevated bilirubin 12/23/2015   Endometrial cancer (HCC)    Endometrial cancer (HCC)    Essential (primary) hypertension 10/24/2014   Essential  hypertension 10/24/2014   Falls 06/2017   Patient fell and hit her head , patient's daughter's states patient may have fell on the kitchen counter   Frailty 03/11/2020   GERD (gastroesophageal reflux disease)    Glaucoma, both eyes    Herpes zoster without complication 03/11/2020   History of breast cancer    1998--  s/p  left mastectomy with sln dissection's  ,  no chemo or radiation-  no recurrence   History of cancer of uterine body 02/23/2021   History of Clostridium difficile infection 5-6 yrs ago   Hyperlipidemia    Hypertension    Hyponatremia 12/23/2015   Insomnia 04/28/2021   Macular degeneration    Major depression 04/28/2021   Narrow complex tachycardia [facility])    cardiologist-  dr elfredia   OA (osteoarthritis)    right knee   Overactive bladder 03/11/2020   Paroxysmal atrial fibrillation (HCC) 12/23/2015   Personal history of transient ischemic attack (TIA), and cerebral infarction without residual deficits 12/22/2015   yrs ago ? 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   Standard chest x-ray abnormal 03/11/2020   Thickened endometrium    TIA (transient ischemic attack) 12/22/2015   Type 2 diabetes mellitus with other specified  complication (HCC) 03/11/2020   Vitamin D  deficiency 03/11/2020   Wears glasses     PAST SURGICAL HISTORY:  Past Surgical History:  Procedure Laterality Date   APPENDECTOMY  1954   CATARACT EXTRACTION W/ INTRAOCULAR LENS  IMPLANT, BILATERAL  1994   colonscopy     DILATION AND CURETTAGE OF UTERUS N/A 11/20/2015   Procedure: DILATATION AND CURETTAGE;  Surgeon: Maurilio Ship, MD;   Location: [facility] [place];  Service: Gynecology;  Laterality: N/A;   DILATION AND CURETTAGE, DIAGNOSTIC / THERAPEUTIC N/A novmber 2017   per daughter with biopsy as stated   KNEE ARTHROSCOPY Right 2002   MASTECTOMY Left 1998   w/ lympn node dissection's   ROBOTIC ASSISTED TOTAL HYSTERECTOMY WITH BILATERAL SALPINGO OOPHERECTOMY Bilateral 01/27/2016   Procedure: XI ROBOTIC ASSISTED TOTAL HYSTERECTOMY WITH BILATERAL SALPINGO OOPHORECTOMY;  Surgeon: Maurilio Ship, MD;  Location: [facility];  Service: Gynecology;  Laterality: Bilateral;   TOTAL HIP ARTHROPLASTY Right 08/30/2020   Procedure: HEMI  HIP ARTHROPLASTY ANTERIOR APPROACH;  Surgeon: Fidel Rogue, MD;  Location: [facility];  Service: Orthopedics;  Laterality: Right;    HEMATOLOGY/ONCOLOGY HISTORY:  Oncology History   No history exists.    ALLERGIES:  is allergic to ativan  [lorazepam ], lorazepam , and metronidazole.  MEDICATIONS:  Current Facility-Administered Medications  Medication Dose Route Frequency Provider Last Rate Last Admin   acetaminophen  (TYLENOL ) tablet 650 mg  650 mg Oral Q6H PRN Howerter, Justin B, DO   650 mg at 10/08/23 1309   Or   acetaminophen  (TYLENOL ) suppository 650 mg  650 mg Rectal Q6H PRN Howerter, Justin B, DO       amiodarone  (NEXTERONE  PREMIX) 360-4.14 MG/200ML-% (1.8 mg/mL) IV infusion  30 mg/hr Intravenous Continuous Nanavati, Ankit, MD 16.67 mL/hr at 10/08/23 1022 30 mg/hr at 10/08/23 1022   atorvastatin  (LIPITOR) tablet 10 mg  10 mg Oral QHS Howerter, Justin B, DO   10 mg at 10/07/23 2130   cefTRIAXone  (ROCEPHIN ) 1 g in sodium chloride  0.9 % 100 mL IVPB  1 g Intravenous Q24H Howerter, Justin B, DO 200 mL/hr at 10/08/23 0903 1 g at 10/08/23 0903   clopidogrel  (PLAVIX ) tablet 75 mg  75 mg Oral Daily Howerter, Justin B, DO   75 mg at 10/08/23 0851   doxycycline  (VIBRA -TABS) tablet 100 mg  100 mg Oral Q12H Merilee Linsey I, RPH       furosemide  (LASIX ) injection 40 mg  40 mg Intravenous BID Barbaraann Darryle Ned, MD   40 mg at 10/08/23 9148   haloperidol  lactate (HALDOL ) injection 5 mg  5 mg Intravenous Q6H PRN Howerter, Justin B, DO   5 mg at 10/08/23 0436   levalbuterol  (XOPENEX ) nebulizer solution 1.25 mg  1.25 mg Nebulization Q4H PRN Howerter, Justin B, DO   1.25 mg at 10/07/23 2020   melatonin tablet 3 mg  3 mg Oral QHS PRN Howerter, Justin B, DO   3 mg at 10/07/23 2130   metoprolol  tartrate (LOPRESSOR ) tablet 25 mg  25 mg Oral Q6H Croitoru, Mihai, MD   25 mg at 10/08/23 1210   ondansetron  (ZOFRAN ) injection 4 mg  4 mg Intravenous Q6H PRN Howerter, Justin B, DO       pantoprazole  (PROTONIX ) EC tablet 40 mg  40 mg Oral q morning Howerter, Justin B, DO   40 mg at 10/08/23 0851    VITAL SIGNS: BP (!) 128/92   Pulse (!) 130   Temp 98 F (36.7 C) (Oral)   Resp (!) 23  Ht 5' 5 (1.651 m)   Wt 128 lb 4.9 oz (58.2 kg)   LMP  (LMP Unknown)   SpO2 93%   BMI 21.35 kg/m  Filed Weights   10/06/23 1634 10/07/23 0300 10/08/23 0355  Weight: 123 lb (55.8 kg) 129 lb 3 oz (58.6 kg) 128 lb 4.9 oz (58.2 kg)    Estimated body mass index is 21.35 kg/m as calculated from the following:   Height as of this encounter: 5' 5 (1.651 m).   Weight as of this encounter: 128 lb 4.9 oz (58.2 kg).  LABS: CBC:    Component Value Date/Time   WBC 7.7 10/08/2023 0927   HGB 9.4 (L) 10/08/2023 0927   HGB 11.3 04/23/2019 1518   HCT 28.8 (L) 10/08/2023 0927   HCT 33.5 (L) 04/23/2019 1518   PLT 128 (L) 10/08/2023 0927   PLT 143 (L) 04/23/2019 1518   MCV 89.7 10/08/2023 0927   MCV 91 04/23/2019 1518   NEUTROABS 8.9 (H) 10/07/2023 0332   NEUTROABS 4.0 04/23/2019 1518   LYMPHSABS 0.6 (L) 10/07/2023 0332   LYMPHSABS 1.2 04/23/2019 1518   MONOABS 0.4 10/07/2023 0332   EOSABS 0.0 10/07/2023 0332   EOSABS 0.2 04/23/2019 1518   BASOSABS 0.0 10/07/2023 0332   BASOSABS 0.0 04/23/2019 1518   Comprehensive Metabolic Panel:    Component Value Date/Time   NA 133 (L) 10/08/2023 0927   NA 136 03/13/2020 1140    NA 141 10/08/2016 1535   K 3.9 10/08/2023 0927   K 4.2 10/08/2016 1535   CL 98 10/08/2023 0927   CO2 20 (L) 10/08/2023 0927   CO2 27 10/08/2016 1535   BUN 25 (H) 10/08/2023 0927   BUN 20 03/13/2020 1140   BUN 17.8 10/08/2016 1535   CREATININE 1.71 (H) 10/08/2023 0927   CREATININE 1.0 10/08/2016 1535   GLUCOSE 124 (H) 10/08/2023 0927   GLUCOSE 97 10/08/2016 1535   CALCIUM  8.9 10/08/2023 0927   CALCIUM  9.5 10/08/2016 1535   AST 24 10/07/2023 0332   AST 26 10/08/2016 1535   ALT 18 10/07/2023 0332   ALT 20 10/08/2016 1535   ALKPHOS 79 10/07/2023 0332   ALKPHOS 97 10/08/2016 1535   BILITOT 1.6 (H) 10/07/2023 0332   BILITOT 0.8 07/15/2020 1510   BILITOT 0.96 10/08/2016 1535   PROT 6.2 (L) 10/07/2023 0332   PROT 6.5 07/15/2020 1510   PROT 7.1 10/08/2016 1535   ALBUMIN 3.8 10/07/2023 0332   ALBUMIN 4.6 07/15/2020 1510   ALBUMIN 4.1 10/08/2016 1535    RADIOGRAPHIC STUDIES: ECHOCARDIOGRAM COMPLETE Result Date: 10/07/2023    ECHOCARDIOGRAM REPORT   Patient Name:   ALORAH MCREE [facility] Date of Exam: 10/07/2023 Medical Rec #:  [id number]            Height:       65.0 in Accession #:    [id number]           Weight:       129.2 lb Date of Birth:  16-Mar-1919            BSA:          1.643 m Patient Age:    89 years            BP:           144/101 mmHg Patient Gender: F                    HR:  140 bpm. Exam Location:  Inpatient Procedure: 2D Echo, Cardiac Doppler and Color Doppler (Both Spectral and Color            Flow Doppler were utilized during procedure). Indications:    Atrial Fibrillation I48.91  History:        Patient has prior history of Echocardiogram examinations, most                 recent 05/11/2023. CAD, TIA, Arrythmias:Atrial Fibrillation; Risk                 Factors:Hypertension and Dyslipidemia.  Sonographer:    Koleen Popper RDCS Referring Phys: [id number] JUSTIN B HOWERTER IMPRESSIONS  1. Left ventricular ejection fraction, by estimation, is 60 to 65%. The left ventricle  has normal function. The left ventricle has no regional wall motion abnormalities. There is mild asymmetric left ventricular hypertrophy of the basal and septal segments. Left ventricular diastolic parameters are indeterminate.  2. Right ventricular systolic function is normal. The right ventricular size is normal. There is severely elevated pulmonary artery systolic pressure.  3. Left atrial size was moderately dilated.  4. The mitral valve is degenerative. Moderate to severe mitral valve regurgitation. No evidence of mitral stenosis. Severe mitral annular calcification.  5. Tricuspid valve regurgitation is moderate.  6. The aortic valve is tricuspid. There is moderate calcification of the aortic valve. There is moderate thickening of the aortic valve. Aortic valve regurgitation is mild. Moderate aortic valve stenosis.  7. The inferior vena cava is dilated in size with <50% respiratory variability, suggesting right atrial pressure of 15 mmHg. FINDINGS  Left Ventricle: Left ventricular ejection fraction, by estimation, is 60 to 65%. The left ventricle has normal function. The left ventricle has no regional wall motion abnormalities. Strain was performed and the global longitudinal strain is indeterminate. The left ventricular internal cavity size was normal in size. There is mild asymmetric left ventricular hypertrophy of the basal and septal segments. Left ventricular diastolic parameters are indeterminate. Right Ventricle: The right ventricular size is normal. No increase in right ventricular wall thickness. Right ventricular systolic function is normal. There is severely elevated pulmonary artery systolic pressure. The tricuspid regurgitant velocity is 3.71 m/s, and with an assumed right atrial pressure of 15 mmHg, the estimated right ventricular systolic pressure is 70.1 mmHg. Left Atrium: Left atrial size was moderately dilated. Right Atrium: Right atrial size was normal in size. Pericardium: There is no  evidence of pericardial effusion. Mitral Valve: The mitral valve is degenerative in appearance. There is moderate thickening of the mitral valve leaflet(s). There is moderate calcification of the mitral valve leaflet(s). Severe mitral annular calcification. Moderate to severe mitral valve regurgitation. No evidence of mitral valve stenosis. Tricuspid Valve: The tricuspid valve is normal in structure. Tricuspid valve regurgitation is moderate . No evidence of tricuspid stenosis. Aortic Valve: The aortic valve is tricuspid. There is moderate calcification of the aortic valve. There is moderate thickening of the aortic valve. Aortic valve regurgitation is mild. Moderate aortic stenosis is present. Aortic valve mean gradient measures 17.7 mmHg. Aortic valve peak gradient measures 31.3 mmHg. Aortic valve area, by VTI measures 1.07 cm. Pulmonic Valve: The pulmonic valve was normal in structure. Pulmonic valve regurgitation is mild. No evidence of pulmonic stenosis. Aorta: The aortic root is normal in size and structure. Venous: The inferior vena cava is dilated in size with less than 50% respiratory variability, suggesting right atrial pressure of 15 mmHg. IAS/Shunts: No atrial level shunt detected by  color flow Doppler. Additional Comments: 3D was performed not requiring image post processing on an independent workstation and was indeterminate.  LEFT VENTRICLE PLAX 2D LVIDd:         3.90 cm LVIDs:         3.00 cm LV PW:         1.00 cm LV IVS:        1.20 cm LVOT diam:     1.70 cm LV SV:         44 LV SV Index:   27 LVOT Area:     2.27 cm  LV Volumes (MOD) LV vol d, MOD A4C: 61.3 ml LV vol s, MOD A4C: 28.6 ml LV SV MOD A4C:     61.3 ml RIGHT VENTRICLE             IVC RV Basal diam:  3.50 cm     IVC diam: 2.40 cm RV S prime:     14.80 cm/s TAPSE (M-mode): 0.8 cm LEFT ATRIUM             Index        RIGHT ATRIUM           Index LA diam:        3.90 cm 2.37 cm/m   RA Area:     16.80 cm LA Vol (A2C):   51.6 ml 31.41  ml/m  RA Volume:   44.60 ml  27.15 ml/m LA Vol (A4C):   63.1 ml 38.41 ml/m LA Biplane Vol: 60.1 ml 36.58 ml/m  AORTIC VALVE AV Area (Vmax):    1.01 cm AV Area (Vmean):   0.99 cm AV Area (VTI):     1.07 cm AV Vmax:           279.67 cm/s AV Vmean:          196.333 cm/s AV VTI:            0.410 m AV Peak Grad:      31.3 mmHg AV Mean Grad:      17.7 mmHg LVOT Vmax:         124.00 cm/s LVOT Vmean:        85.300 cm/s LVOT VTI:          0.193 m LVOT/AV VTI ratio: 0.47  AORTA Ao Root diam: 2.80 cm Ao Asc diam:  3.00 cm TRICUSPID VALVE TR Peak grad:   55.1 mmHg TR Vmax:        371.00 cm/s  SHUNTS Systemic VTI:  0.19 m Systemic Diam: 1.70 cm Maude Emmer MD Electronically signed by Maude Emmer MD Signature Date/Time: 10/07/2023/12:47:28 PM    Final    CT Angio Chest Pulmonary Embolism (PE) W or WO Contrast Result Date: 10/07/2023 CLINICAL DATA:  Atrial fibrillation with shortness of breath and cough, initial encounter EXAM: CT ANGIOGRAPHY CHEST WITH CONTRAST TECHNIQUE: Multidetector CT imaging of the chest was performed using the standard protocol during bolus administration of intravenous contrast. Multiplanar CT image reconstructions and MIPs were obtained to evaluate the vascular anatomy. RADIATION DOSE REDUCTION: This exam was performed according to the departmental dose-optimization program which includes automated exposure control, adjustment of the mA and/or kV according to patient size and/or use of iterative reconstruction technique. CONTRAST:  75mL OMNIPAQUE  IOHEXOL  350 MG/ML SOLN COMPARISON:  Chest x-ray from earlier in the same day. FINDINGS: Cardiovascular: Atherosclerotic calcifications of the thoracic aorta are noted. No aneurysmal dilatation is seen. The degree of opacification of the aorta is limited  precluding evaluation for dissection. The pulmonary artery shows a normal branching pattern bilaterally. No filling defect to suggest pulmonary embolism is noted. Mediastinum/Nodes: Thoracic inlet shows  mild heterogeneity of the thyroid  without discrete sizable nodule. No hilar or mediastinal adenopathy is noted. The esophagus as visualized is within normal limits. Lungs/Pleura: Small pleural effusions are noted bilaterally. Mild bibasilar atelectasis is seen. No focal confluent infiltrate is noted. The changes seen on recent plain film are not borne out on this exam. Mild changes of air trapping are noted. Parenchymal scarring is noted in the right upper lobe. Upper Abdomen: Visualized upper abdomen is within normal limits. Musculoskeletal: Degenerative changes of the thoracic spine are noted. No acute rib abnormality is noted. Review of the MIP images confirms the above findings. IMPRESSION: No evidence of pulmonary embolism. Increasing airspace opacity in the right upper lobe is not borne out on this exam. Small bilateral pleural effusions with basilar atelectasis. Aortic Atherosclerosis (ICD10-I70.0). Electronically Signed   By: Oneil Devonshire M.D.   On: 10/07/2023 01:19   DG Chest Port 1 View Result Date: 10/07/2023 CLINICAL DATA:  Shortness of breath EXAM: PORTABLE CHEST 1 VIEW COMPARISON:  10/06/2023 FINDINGS: Cardiac shadow is stable. Lungs are well aerated bilaterally. Some increasing airspace opacity in the right upper lobe is noted which may represent developing infiltrate. No sizable effusion is seen. No bony abnormality is noted. IMPRESSION: Increasing right upper lobe airspace opacity consistent with developing infiltrate. Electronically Signed   By: Oneil Devonshire M.D.   On: 10/07/2023 00:38   DG Chest Port 1 View Result Date: 10/06/2023 CLINICAL DATA:  Question sepsis. EXAM: PORTABLE CHEST 1 VIEW COMPARISON:  Chest radiograph dated 07/23/2023. FINDINGS: There is diffuse chronic interstitial coarsening. Faint diffuse interstitial densities may represent chronic changes, edema, or atypical pneumonia. No focal consolidation, pleural effusion or pneumothorax. Stable cardiac silhouette. No acute  osseous pathology. IMPRESSION: Chronic interstitial coarsening. No focal consolidation. Electronically Signed   By: Vanetta Chou M.D.   On: 10/06/2023 18:54    PERFORMANCE STATUS (ECOG) : 3 - Symptomatic, >50% confined to bed  Review of Systems Unless otherwise noted, a complete review of systems is negative.  Physical Exam General: Thin, frail-appearing Cardiovascular: Tachycardic, irregular Pulmonary: clear ant fields Abdomen: soft, nontender, + bowel sounds GU: no suprapubic tenderness Extremities: no edema, no joint deformities Skin: no rashes Neurological: Weakness, some confusion  IMPRESSION: Patient is 89 year old woman with severe MR, who was admitted with acute on chronic HFpEF respiratory failure/possible pneumonia.  Patient has had poor rate control despite amiodarone  infusion and beta-blockers with concern for hypotension.    I met with patient's daughter, son-in-law, and two granddaughters.  They have also met with cardiology and hospitalist.  Family say they feel patient has been uncomfortable appearing the past two nights while hospitalized.  Although there has been slight improvement today, family have been told that patient has limited viable treatment options long-term for her severe valvular heart disease and prognosis is likely limited.    Family wanted to explore the option of hospice involvement.  Prior to this hospitalization, patient was living at home with 24/7 caregivers.  Patient has lived in a house beside her daughter. We discussed the option of home with hospice vs hospice IPU vs transition to comfort care in the hospital.   Daughter would prefer patient not dying at home and asked about option of transfer to [facility]. Will have the hospice liaison speak with family.  While in agreement with the current scope  of medical treatment, family say their primary goal is patient's comfort and quality of life.   PLAN: - Continue current scope of  treatment - Hospice liaison speak with family - Will have PMT follow  Case and plan discussed with Dr. Sigdel  Time Total: 60  Visit consisted of counseling and education dealing with the complex and emotionally intense issues of symptom management and palliative care in the setting of serious and potentially life-threatening illness.Greater than 50%  of this time was spent counseling and coordinating care related to the above assessment and plan.  Signed by: Fonda Mower, PhD, NP-C

## 2023-10-08 NOTE — Progress Notes (Signed)
  Progress Note  Patient Name: Sheri Hensley Date of Encounter: 10/08/2023 [facility] Cardiologist: Sheri JONELLE Crape, MD   Interval Summary   She appears to be a little more comfortable. Remains in AFib w RVR 110-120 Labs pending  Vital Signs Vitals:   10/08/23 0400 10/08/23 0445 10/08/23 0729 10/08/23 1131  BP: (!) 113/96 (!) 113/96 91/61 (!) 133/96  Pulse: (!) 118 (!) 118 (!) 106 (!) 110  Resp: (!) 21  19 (!) 23  Temp:   98 F (36.7 C) 98 F (36.7 C)  TempSrc:   Axillary Oral  SpO2: 91%  96% 93%  Weight:      Height:        Intake/Output Summary (Last 24 hours) at 10/08/2023 1136 Last data filed at 10/08/2023 1133 Gross per 24 hour  Intake 468.25 ml  Output 1150 ml  Net -681.75 ml      10/08/2023    3:55 AM 10/07/2023    3:00 AM 10/06/2023    4:34 PM  Last 3 Weights  Weight (lbs) 128 lb 4.9 oz 129 lb 3 oz 123 lb  Weight (kg) 58.2 kg 58.6 kg 55.792 kg      Telemetry/ECG  Afib w RVR - Personally Reviewed  Physical Exam  GEN: No acute distress.   Neck: No JVD Cardiac: irregular, apical holosystolic murmur no diastolic murmurs, rubs, or gallops.  Respiratory: Clear to auscultation bilaterally. GI: Soft, nontender, non-distended  MS: No edema  Assessment & Plan  89 year old woman with history of paroxysmal atrial fibrillation, severe MR, admitted with acute on chronic HFpEF respiratory failure, possibly due to pneumonia  Rate control for atrial fibrillation remains elusive despite intravenous amiodarone  beta-blockers and a cautious dose due to low blood pressure. Overall response to diuretics was mediocre, but seems to have had a negative fluid balance of 700 ml.  Awaiting palliative care consultation.  The patient's daughter Sheri Hensley is clearly on board with palliative care approach and so is her granddaughter (although Sheri Hensley is somewhat more emotional about the topic).  Meanwhile we will continue antibiotics and intravenous amiodarone .  Since  the blood pressure is better we will try to inch up the dose of beta-blocker.   For questions or updates, please contact [facility] Please consult [email or web address] for contact info under         Signed, Sheri Balding, MD

## 2023-10-08 NOTE — Progress Notes (Signed)
 PROGRESS NOTE    Sheri Hensley  FMW:[id number] DOB: September 27, 1919 DOA: 10/06/2023 PCP: Charlott Dorn LABOR, MD   Brief Narrative:    Assessment & Plan:   Principal Problem:   Atrial fibrillation with RVR (HCC) Active Problems:   HLD (hyperlipidemia)   Acute hyponatremia   Depression   Generalized weakness   History of essential hypertension   Anemia of chronic disease  Sheri Hensley is a 89 y.o. female with a PMH significant for paroxysmal atrial fibrillation not on anticoagulation, hyperlipidemia, chronic diastolic heart failure, moderate to severe mitral digitation, anemia of chronic disease with baseline hemoglobin 9-11, who is admitted to [facility] on 10/06/2023 with atrial fibrillation with RVR after presenting from home to [facility] ED complaining of generalized weakness. Likely exacerbated by CAP and MVR.    10/07/23 -cardiology has been consulted for Afib management. Continues on IV Abx.  Plan to continue current management, if deterioration, hospice is recommended   Assessment and plan: Atrial fibrillation with RVR: difficult to manage.  - cardiology following, on Amio drip, metoprolol .  Patient continues to be in A-fib with heart rate in 1 teens to 120s. - IV diuresis, continue Lasix  40 mg twice daily with close monitoring of electrolytes and kidney function.  Creatinine is slightly worse at 1.45 - palliative has been consulted. Patient's prognosis is guarded at this time. - Daughter at the bedside is interested to visit with palliative and discuss options.  She understands that her mom may not get better and that keeping her comfortable and symptoms management would be appropriate choice.   CAP- hypoxic with infiltrate developing on chest imaging.  Wheezing on admission has improved.- continue IV Abx.  Procalcitonin negative. - wean O2 as able - breathing treatments PRN    Mild hyponatremia- improving. Holding home HCTZ - BMP am   #) Hyperlipidemia:    -continue home statin.      #) Depression:  - In the setting of presenting acute hyponatremia, will hold home Zoloft .   #) Anemia of chronic disease: - monitor    Body mass index is 21.5 kg/m.   VTE ppx: SCDs Start: 10/06/23 2037  Code Status:  DNR Family Communication:  Disposition Plan: Status is: Inpatient   Consultants: cardiology, palliative    Subjective:  Patient seen and examined at the bedside.  Patient is somewhat dyspneic.  Alert and conversant.  Daughter and granddaughter at the bedside.  She continues to be in rapid A-fib despite IV Amio.  Has received IV antibiotics as well as IV Lasix .  Blood pressure is stable although his readings in 90s systolic.  Daughter is interested to discuss with palliative care team about options.  Objective: Vitals:   10/08/23 0355 10/08/23 0400 10/08/23 0445 10/08/23 0729  BP:  (!) 113/96 (!) 113/96 91/61  Pulse: (!) 114 (!) 118 (!) 118   Resp: 18 (!) 21  19  Temp: 97.6 F (36.4 C)   98 F (36.7 C)  TempSrc: Oral   Axillary  SpO2: 96% 91%  96%  Weight: 58.2 kg     Height:        Intake/Output Summary (Last 24 hours) at 10/08/2023 0753 Last data filed at 10/08/2023 0406 Gross per 24 hour  Intake 984.01 ml  Output 650 ml  Net 334.01 ml   Filed Weights   10/06/23 1634 10/07/23 0300 10/08/23 0355  Weight: 55.8 kg 58.6 kg 58.2 kg    Examination:  General: Ill looking, mild dyspnea Chest: Mild crackles  bilaterally, no wheezing CVS: Tachycardic, irregular, murmur present Abdomen: Soft, nontender Extremities: No edema   Data Reviewed: I have personally reviewed following labs and imaging studies  CBC: Recent Labs  Lab 10/06/23 1805 10/07/23 0332  WBC 10.8* 10.0  NEUTROABS 9.4* 8.9*  HGB 10.1* 9.5*  HCT 32.0* 29.2*  MCV 92.0 90.1  PLT 156 135*   Basic Metabolic Panel: Recent Labs  Lab 10/06/23 1805 10/07/23 0332  NA 130* 131*  K 4.5 4.4  CL 98 100  CO2 21* 21*  GLUCOSE 109* 118*  BUN 24* 23   CREATININE 1.40* 1.45*  CALCIUM  9.1 9.0  MG 1.8 1.6*   GFR: Estimated Creatinine Clearance: 18.1 mL/min (A) (by C-G formula based on SCr of 1.45 mg/dL (H)). Liver Function Tests: Recent Labs  Lab 10/06/23 1805 10/07/23 0332  AST 21 24  ALT 16 18  ALKPHOS 73 79  BILITOT 1.2 1.6*  PROT 6.6 6.2*  ALBUMIN 4.0 3.8   No results for input(s): LIPASE, AMYLASE in the last 168 hours. No results for input(s): AMMONIA in the last 168 hours. Coagulation Profile: Recent Labs  Lab 10/06/23 1805  INR 1.2   Cardiac Enzymes: No results for input(s): CKTOTAL, CKMB, CKMBINDEX, TROPONINI in the last 168 hours. BNP (last 3 results) No results for input(s): PROBNP in the last 8760 hours. HbA1C: No results for input(s): HGBA1C in the last 72 hours. CBG: No results for input(s): GLUCAP in the last 168 hours. Lipid Profile: No results for input(s): CHOL, HDL, LDLCALC, TRIG, CHOLHDL, LDLDIRECT in the last 72 hours. Thyroid  Function Tests: Recent Labs    10/06/23 1805  TSH 5.258*   Anemia Panel: Recent Labs    10/07/23 0332  VITAMINB12 509   Sepsis Labs: Recent Labs  Lab 10/06/23 1805 10/06/23 1852 10/06/23 2022  PROCALCITON <0.10  --   --   LATICACIDVEN  --  1.1 1.2    Recent Results (from the past 240 hours)  Resp panel by RT-PCR (RSV, Flu A&B, Covid) Anterior Nasal Swab     Status: None   Collection Time: 10/06/23  6:05 PM   Specimen: Anterior Nasal Swab  Result Value Ref Range Status   SARS Coronavirus 2 by RT PCR NEGATIVE NEGATIVE Final   Influenza A by PCR NEGATIVE NEGATIVE Final   Influenza B by PCR NEGATIVE NEGATIVE Final    Comment: (NOTE) The Xpert Xpress SARS-CoV-2/FLU/RSV plus assay is intended as an aid in the diagnosis of influenza from Nasopharyngeal swab specimens and should not be used as a sole basis for treatment. Nasal washings and aspirates are unacceptable for Xpert Xpress SARS-CoV-2/FLU/RSV testing.  Fact Sheet for  Patients: BloggerCourse.com  Fact Sheet for Healthcare Providers: SeriousBroker.it  This test is not yet approved or cleared by the [place]  FDA and has been authorized for detection and/or diagnosis of SARS-CoV-2 by FDA under an Emergency Use Authorization (EUA). This EUA will remain in effect (meaning this test can be used) for the duration of the COVID-19 declaration under Section 564(b)(1) of the Act, 21 U.S.C. section 360bbb-3(b)(1), unless the authorization is terminated or revoked.     Resp Syncytial Virus by PCR NEGATIVE NEGATIVE Final    Comment: (NOTE) Fact Sheet for Patients: BloggerCourse.com  Fact Sheet for Healthcare Providers: SeriousBroker.it  This test is not yet approved or cleared by the [place]  FDA and has been authorized for detection and/or diagnosis of SARS-CoV-2 by FDA under an Emergency Use Authorization (EUA). This EUA will remain in effect (meaning  this test can be used) for the duration of the COVID-19 declaration under Section 564(b)(1) of the Act, 21 U.S.C. section 360bbb-3(b)(1), unless the authorization is terminated or revoked.  Performed at [facility], [address] [place], [place] [address]   Urine Culture     Status: None   Collection Time: 10/06/23  6:07 PM   Specimen: In/Out Cath Urine  Result Value Ref Range Status   Specimen Description IN/OUT CATH URINE  Final   Special Requests NONE  Final   Culture   Final    NO GROWTH Performed at [facility], [address] [place], [place] [address]    Report Status 10/07/2023 FINAL  Final  Blood Culture (routine x 2)     Status: None (Preliminary result)   Collection Time: 10/06/23  6:10 PM   Specimen: BLOOD  Result Value Ref Range Status   Specimen Description BLOOD LEFT ANTECUBITAL  Final   Special Requests   Final    BOTTLES DRAWN AEROBIC AND ANAEROBIC Blood  Culture results may not be optimal due to an inadequate volume of blood received in culture bottles   Culture   Final    NO GROWTH < 24 HOURS Performed at [facility], [address], [place], [place] [address]    Report Status PENDING  Incomplete  Blood Culture (routine x 2)     Status: None (Preliminary result)   Collection Time: 10/06/23  6:30 PM   Specimen: BLOOD RIGHT FOREARM  Result Value Ref Range Status   Specimen Description BLOOD RIGHT FOREARM  Final   Special Requests   Final    BOTTLES DRAWN AEROBIC AND ANAEROBIC Blood Culture results may not be optimal due to an inadequate volume of blood received in culture bottles   Culture   Final    NO GROWTH < 12 HOURS Performed at [facility], [address] [place], [place] [address]    Report Status PENDING  Incomplete  MRSA Next Gen by PCR, Nasal     Status: None   Collection Time: 10/07/23  3:01 AM   Specimen: Nasal Mucosa; Nasal Swab  Result Value Ref Range Status   MRSA by PCR Next Gen NOT DETECTED NOT DETECTED Final    Comment: (NOTE) The GeneXpert MRSA Assay (FDA approved for NASAL specimens only), is one component of a comprehensive MRSA colonization surveillance program. It is not intended to diagnose MRSA infection nor to guide or monitor treatment for MRSA infections. Test performance is not FDA approved in patients less than 20 years old. Performed at [facility], [address] [place], [place] [address]          Radiology Studies: ECHOCARDIOGRAM COMPLETE Result Date: 10/07/2023    ECHOCARDIOGRAM REPORT   Patient Name:   Sheri Hensley [facility] Date of Exam: 10/07/2023 Medical Rec #:  [id number]            Height:       65.0 in Accession #:    [id number]           Weight:       129.2 lb Date of Birth:  15-Oct-1919            BSA:          1.643 m Patient Age:    89 years            BP:           144/101 mmHg  Patient Gender: F                    HR:           140 bpm. Exam Location:  Inpatient  Procedure: 2D Echo, Cardiac Doppler and Color Doppler (Both Spectral and Color            Flow Doppler were utilized during procedure). Indications:    Atrial Fibrillation I48.91  History:        Patient has prior history of Echocardiogram examinations, most                 recent 05/11/2023. CAD, TIA, Arrythmias:Atrial Fibrillation; Risk                 Factors:Hypertension and Dyslipidemia.  Sonographer:    Koleen Popper RDCS Referring Phys: [id number] JUSTIN B HOWERTER IMPRESSIONS  1. Left ventricular ejection fraction, by estimation, is 60 to 65%. The left ventricle has normal function. The left ventricle has no regional wall motion abnormalities. There is mild asymmetric left ventricular hypertrophy of the basal and septal segments. Left ventricular diastolic parameters are indeterminate.  2. Right ventricular systolic function is normal. The right ventricular size is normal. There is severely elevated pulmonary artery systolic pressure.  3. Left atrial size was moderately dilated.  4. The mitral valve is degenerative. Moderate to severe mitral valve regurgitation. No evidence of mitral stenosis. Severe mitral annular calcification.  5. Tricuspid valve regurgitation is moderate.  6. The aortic valve is tricuspid. There is moderate calcification of the aortic valve. There is moderate thickening of the aortic valve. Aortic valve regurgitation is mild. Moderate aortic valve stenosis.  7. The inferior vena cava is dilated in size with <50% respiratory variability, suggesting right atrial pressure of 15 mmHg. FINDINGS  Left Ventricle: Left ventricular ejection fraction, by estimation, is 60 to 65%. The left ventricle has normal function. The left ventricle has no regional wall motion abnormalities. Strain was performed and the global longitudinal strain is indeterminate. The left ventricular internal cavity size was normal in size. There is mild asymmetric left ventricular hypertrophy of the basal and septal segments.  Left ventricular diastolic parameters are indeterminate. Right Ventricle: The right ventricular size is normal. No increase in right ventricular wall thickness. Right ventricular systolic function is normal. There is severely elevated pulmonary artery systolic pressure. The tricuspid regurgitant velocity is 3.71 m/s, and with an assumed right atrial pressure of 15 mmHg, the estimated right ventricular systolic pressure is 70.1 mmHg. Left Atrium: Left atrial size was moderately dilated. Right Atrium: Right atrial size was normal in size. Pericardium: There is no evidence of pericardial effusion. Mitral Valve: The mitral valve is degenerative in appearance. There is moderate thickening of the mitral valve leaflet(s). There is moderate calcification of the mitral valve leaflet(s). Severe mitral annular calcification. Moderate to severe mitral valve regurgitation. No evidence of mitral valve stenosis. Tricuspid Valve: The tricuspid valve is normal in structure. Tricuspid valve regurgitation is moderate . No evidence of tricuspid stenosis. Aortic Valve: The aortic valve is tricuspid. There is moderate calcification of the aortic valve. There is moderate thickening of the aortic valve. Aortic valve regurgitation is mild. Moderate aortic stenosis is present. Aortic valve mean gradient measures 17.7 mmHg. Aortic valve peak gradient measures 31.3 mmHg. Aortic valve area, by VTI measures 1.07 cm. Pulmonic Valve: The pulmonic valve was normal in structure. Pulmonic valve regurgitation is mild. No evidence of pulmonic stenosis. Aorta: The aortic root is normal  in size and structure. Venous: The inferior vena cava is dilated in size with less than 50% respiratory variability, suggesting right atrial pressure of 15 mmHg. IAS/Shunts: No atrial level shunt detected by color flow Doppler. Additional Comments: 3D was performed not requiring image post processing on an independent workstation and was indeterminate.  LEFT VENTRICLE  PLAX 2D LVIDd:         3.90 cm LVIDs:         3.00 cm LV PW:         1.00 cm LV IVS:        1.20 cm LVOT diam:     1.70 cm LV SV:         44 LV SV Index:   27 LVOT Area:     2.27 cm  LV Volumes (MOD) LV vol d, MOD A4C: 61.3 ml LV vol s, MOD A4C: 28.6 ml LV SV MOD A4C:     61.3 ml RIGHT VENTRICLE             IVC RV Basal diam:  3.50 cm     IVC diam: 2.40 cm RV S prime:     14.80 cm/s TAPSE (M-mode): 0.8 cm LEFT ATRIUM             Index        RIGHT ATRIUM           Index LA diam:        3.90 cm 2.37 cm/m   RA Area:     16.80 cm LA Vol (A2C):   51.6 ml 31.41 ml/m  RA Volume:   44.60 ml  27.15 ml/m LA Vol (A4C):   63.1 ml 38.41 ml/m LA Biplane Vol: 60.1 ml 36.58 ml/m  AORTIC VALVE AV Area (Vmax):    1.01 cm AV Area (Vmean):   0.99 cm AV Area (VTI):     1.07 cm AV Vmax:           279.67 cm/s AV Vmean:          196.333 cm/s AV VTI:            0.410 m AV Peak Grad:      31.3 mmHg AV Mean Grad:      17.7 mmHg LVOT Vmax:         124.00 cm/s LVOT Vmean:        85.300 cm/s LVOT VTI:          0.193 m LVOT/AV VTI ratio: 0.47  AORTA Ao Root diam: 2.80 cm Ao Asc diam:  3.00 cm TRICUSPID VALVE TR Peak grad:   55.1 mmHg TR Vmax:        371.00 cm/s  SHUNTS Systemic VTI:  0.19 m Systemic Diam: 1.70 cm Maude Emmer MD Electronically signed by Maude Emmer MD Signature Date/Time: 10/07/2023/12:47:28 PM    Final    CT Angio Chest Pulmonary Embolism (PE) W or WO Contrast Result Date: 10/07/2023 CLINICAL DATA:  Atrial fibrillation with shortness of breath and cough, initial encounter EXAM: CT ANGIOGRAPHY CHEST WITH CONTRAST TECHNIQUE: Multidetector CT imaging of the chest was performed using the standard protocol during bolus administration of intravenous contrast. Multiplanar CT image reconstructions and MIPs were obtained to evaluate the vascular anatomy. RADIATION DOSE REDUCTION: This exam was performed according to the departmental dose-optimization program which includes automated exposure control, adjustment of the mA  and/or kV according to patient size and/or use of iterative reconstruction technique. CONTRAST:  75mL OMNIPAQUE  IOHEXOL  350 MG/ML SOLN  COMPARISON:  Chest x-ray from earlier in the same day. FINDINGS: Cardiovascular: Atherosclerotic calcifications of the thoracic aorta are noted. No aneurysmal dilatation is seen. The degree of opacification of the aorta is limited precluding evaluation for dissection. The pulmonary artery shows a normal branching pattern bilaterally. No filling defect to suggest pulmonary embolism is noted. Mediastinum/Nodes: Thoracic inlet shows mild heterogeneity of the thyroid  without discrete sizable nodule. No hilar or mediastinal adenopathy is noted. The esophagus as visualized is within normal limits. Lungs/Pleura: Small pleural effusions are noted bilaterally. Mild bibasilar atelectasis is seen. No focal confluent infiltrate is noted. The changes seen on recent plain film are not borne out on this exam. Mild changes of air trapping are noted. Parenchymal scarring is noted in the right upper lobe. Upper Abdomen: Visualized upper abdomen is within normal limits. Musculoskeletal: Degenerative changes of the thoracic spine are noted. No acute rib abnormality is noted. Review of the MIP images confirms the above findings. IMPRESSION: No evidence of pulmonary embolism. Increasing airspace opacity in the right upper lobe is not borne out on this exam. Small bilateral pleural effusions with basilar atelectasis. Aortic Atherosclerosis (ICD10-I70.0). Electronically Signed   By: Oneil Devonshire M.D.   On: 10/07/2023 01:19   DG Chest Port 1 View Result Date: 10/07/2023 CLINICAL DATA:  Shortness of breath EXAM: PORTABLE CHEST 1 VIEW COMPARISON:  10/06/2023 FINDINGS: Cardiac shadow is stable. Lungs are well aerated bilaterally. Some increasing airspace opacity in the right upper lobe is noted which may represent developing infiltrate. No sizable effusion is seen. No bony abnormality is noted. IMPRESSION:  Increasing right upper lobe airspace opacity consistent with developing infiltrate. Electronically Signed   By: Oneil Devonshire M.D.   On: 10/07/2023 00:38   DG Chest Port 1 View Result Date: 10/06/2023 CLINICAL DATA:  Question sepsis. EXAM: PORTABLE CHEST 1 VIEW COMPARISON:  Chest radiograph dated 07/23/2023. FINDINGS: There is diffuse chronic interstitial coarsening. Faint diffuse interstitial densities may represent chronic changes, edema, or atypical pneumonia. No focal consolidation, pleural effusion or pneumothorax. Stable cardiac silhouette. No acute osseous pathology. IMPRESSION: Chronic interstitial coarsening. No focal consolidation. Electronically Signed   By: Vanetta Chou M.D.   On: 10/06/2023 18:54        Scheduled Meds:  atorvastatin   10 mg Oral QHS   clopidogrel   75 mg Oral Daily   furosemide   40 mg Intravenous BID   metoprolol  tartrate  12.5 mg Oral Q6H   pantoprazole   40 mg Oral q morning   Continuous Infusions:  amiodarone  30 mg/hr (10/07/23 2140)   cefTRIAXone  (ROCEPHIN )  IV Stopped (10/07/23 1141)   doxycycline  (VIBRAMYCIN ) IV 100 mg (10/07/23 2127)          Charistopher Rumble, MD [facility] 10/08/2023, 7:53 AM

## 2023-10-08 NOTE — TOC Progression Note (Signed)
 Transition of Care [facility]) - Progression Note    Patient Details  Name: Sheri Hensley MRN: [id number] Date of Birth: 06-22-1919  [facility] CM/SW Contact  Robynn Eileen Hoose, RN Phone Number: 10/08/2023, 3:57 PM  Clinical Narrative:   Secure message received regarding family interested in [facility]. [facility] liaison aware.    Expected Discharge Plan: Home/Self Care Barriers to Discharge: Continued Medical Work up               Expected Discharge Plan and Services       Living arrangements for the past 2 months: Single Family Home                                       Social Drivers of Health (SDOH) Interventions SDOH Screenings   Food Insecurity: No Food Insecurity (10/07/2023)  Housing: Low Risk  (10/07/2023)  Transportation Needs: No Transportation Needs (10/07/2023)  Utilities: Not At Risk (10/07/2023)  Social Connections: Moderately Isolated (10/07/2023)  Tobacco Use: Low Risk  (10/06/2023)    Readmission Risk Interventions     No data to display

## 2023-10-08 NOTE — Progress Notes (Signed)
 Carefully collecting strict I&O for patient. At 2040 brief and bedding found to be soaked with large amt of urine, (Purewick had displaced self at some point). Because this was not able to be measured, it will not calculate in total output number for shift.   Bedding changed, peri care done.

## 2023-10-09 DIAGNOSIS — J189 Pneumonia, unspecified organism: Principal | ICD-10-CM

## 2023-10-09 DIAGNOSIS — N390 Urinary tract infection, site not specified: Secondary | ICD-10-CM | POA: Diagnosis not present

## 2023-10-09 DIAGNOSIS — I4891 Unspecified atrial fibrillation: Secondary | ICD-10-CM | POA: Diagnosis not present

## 2023-10-09 DIAGNOSIS — Z7189 Other specified counseling: Secondary | ICD-10-CM

## 2023-10-09 DIAGNOSIS — Z789 Other specified health status: Secondary | ICD-10-CM

## 2023-10-09 DIAGNOSIS — Z66 Do not resuscitate: Secondary | ICD-10-CM

## 2023-10-09 DIAGNOSIS — Z515 Encounter for palliative care: Secondary | ICD-10-CM | POA: Diagnosis not present

## 2023-10-09 LAB — BASIC METABOLIC PANEL WITH GFR
Anion gap: 18 — ABNORMAL HIGH (ref 5–15)
BUN: 27 mg/dL — ABNORMAL HIGH (ref 8–23)
CO2: 20 mmol/L — ABNORMAL LOW (ref 22–32)
Calcium: 8.3 mg/dL — ABNORMAL LOW (ref 8.9–10.3)
Chloride: 91 mmol/L — ABNORMAL LOW (ref 98–111)
Creatinine, Ser: 1.92 mg/dL — ABNORMAL HIGH (ref 0.44–1.00)
GFR, Estimated: 23 mL/min — ABNORMAL LOW (ref >60)
Glucose, Bld: 298 mg/dL — ABNORMAL HIGH (ref 70–99)
Potassium: 3.4 mmol/L — ABNORMAL LOW (ref 3.5–5.1)
Sodium: 129 mmol/L — ABNORMAL LOW (ref 135–145)

## 2023-10-09 LAB — GLUCOSE, CAPILLARY: Glucose-Capillary: 131 mg/dL — ABNORMAL HIGH (ref 70–99)

## 2023-10-09 MED ORDER — HALOPERIDOL 1 MG PO TABS: 2.0000 mg | ORAL_TABLET | Freq: Four times a day (QID) | ORAL | Status: DC | PRN

## 2023-10-09 MED ORDER — LORAZEPAM 2 MG/ML IJ SOLN: 1.0000 mg | INTRAMUSCULAR | Status: DC | PRN

## 2023-10-09 MED ORDER — GLYCOPYRROLATE 0.2 MG/ML IJ SOLN: 0.2000 mg | INTRAMUSCULAR | Status: DC | PRN

## 2023-10-09 MED ORDER — ONDANSETRON 4 MG PO TBDP: 4.0000 mg | ORAL_TABLET | Freq: Four times a day (QID) | ORAL | Status: DC | PRN

## 2023-10-09 MED ORDER — METOPROLOL TARTRATE 50 MG PO TABS
50.0000 mg | ORAL_TABLET | Freq: Two times a day (BID) | ORAL | Status: DC
  Administered 2023-10-09 – 2023-10-10 (×2): 50 mg via ORAL
  Filled 2023-10-09 (×2): qty 1

## 2023-10-09 MED ORDER — BIOTENE DRY MOUTH MT LIQD
15.0000 mL | Freq: Two times a day (BID) | OROMUCOSAL | Status: DC
  Administered 2023-10-10: 15 mL via TOPICAL
  Filled 2023-10-09: qty 15

## 2023-10-09 MED ORDER — LORAZEPAM 2 MG/ML PO CONC: 1.0000 mg | ORAL | Status: DC | PRN

## 2023-10-09 MED ORDER — MORPHINE SULFATE (PF) 2 MG/ML IV SOLN: 1.0000 mg | INTRAVENOUS | Status: DC | PRN

## 2023-10-09 MED ORDER — HALOPERIDOL LACTATE 5 MG/ML IJ SOLN: 2.0000 mg | Freq: Four times a day (QID) | INTRAMUSCULAR | Status: DC | PRN

## 2023-10-09 MED ORDER — POLYVINYL ALCOHOL 1.4 % OP SOLN: 1.0000 [drp] | Freq: Four times a day (QID) | OPHTHALMIC | Status: DC | PRN

## 2023-10-09 MED ORDER — GLYCOPYRROLATE 1 MG PO TABS: 1.0000 mg | ORAL_TABLET | ORAL | Status: DC | PRN

## 2023-10-09 MED ORDER — LORAZEPAM 1 MG PO TABS: 1.0000 mg | ORAL_TABLET | ORAL | Status: DC | PRN

## 2023-10-09 MED ORDER — DIPHENHYDRAMINE HCL 50 MG/ML IJ SOLN: 25.0000 mg | INTRAMUSCULAR | Status: DC | PRN

## 2023-10-09 MED ORDER — OXYCODONE HCL 5 MG PO TABS: 5.0000 mg | ORAL_TABLET | ORAL | Status: DC | PRN

## 2023-10-09 MED ORDER — ONDANSETRON HCL 4 MG/2ML IJ SOLN: 4.0000 mg | Freq: Four times a day (QID) | INTRAMUSCULAR | Status: DC | PRN

## 2023-10-09 MED ORDER — HALOPERIDOL LACTATE 2 MG/ML PO CONC: 2.0000 mg | Freq: Four times a day (QID) | ORAL | Status: DC | PRN

## 2023-10-09 MED ORDER — HYDROMORPHONE HCL 1 MG/ML IJ SOLN
0.5000 mg | INTRAMUSCULAR | Status: DC | PRN
  Administered 2023-10-10: 1 mg via INTRAVENOUS
  Filled 2023-10-09: qty 1

## 2023-10-09 NOTE — Progress Notes (Signed)
 PT Cancellation Note  Patient Details Name: Sheri Hensley MRN: [id number] DOB: [birth year]   Cancelled Treatment:    Reason Eval/Treat Not Completed: Other (comment). Pt going home with hospice. No PT.   Rodgers ORN [facility] 10/09/2023, 9:20 AM Rodgers Opal PT Acute [employer] [phone]

## 2023-10-09 NOTE — Progress Notes (Signed)
 PROGRESS NOTE    Sheri Hensley  FMW:[id number] DOB: 05/13/1919 DOA: 10/06/2023 PCP: Charlott Dorn LABOR, MD   Brief Narrative:    Assessment & Plan:   Principal Problem:   Atrial fibrillation with RVR (HCC) Active Problems:   HLD (hyperlipidemia)   Acute hyponatremia   Depression   Generalized weakness   History of essential hypertension   Anemia of chronic disease   Palliative care encounter  Sheri Hensley is a 89 y.o. female with a PMH significant for paroxysmal atrial fibrillation not on anticoagulation, hyperlipidemia, chronic diastolic heart failure, moderate to severe mitral digitation, anemia of chronic disease with baseline hemoglobin 9-11, who is admitted to [facility] on 10/06/2023 with atrial fibrillation with RVR after presenting from home to [facility] ED complaining of generalized weakness. Likely exacerbated by CAP and MVR.    10/07/23 -cardiology has been consulted for Afib management. Continues on IV Abx.  Plan to continue current management, if deterioration, hospice is recommended   Assessment and plan: Atrial fibrillation with RVR: difficult to manage.  - cardiology following, on Amio drip, metoprolol .  Patient continues to be in A-fib with heart rate in 1 teens to 120s. - IV diuresis, continue Lasix  40 mg twice daily with close monitoring of electrolytes and kidney function.  Creatinine was 1.7 yesterday.  Will recheck today - Palliative/hospice on board to discuss further options.  Continue current management at this time.  CAP- hypoxic with infiltrate developing on chest imaging.  Wheezing on admission has improved.- continue IV Abx.  Procalcitonin negative. - wean O2 as able - breathing treatments PRN    Mild hyponatremia- improving. Holding home HCTZ - BMP am   #) Hyperlipidemia:   -continue home statin.      #) Depression:  - In the setting of presenting acute hyponatremia, will hold home Zoloft .   #) Anemia of chronic  disease: - monitor    Body mass index is 21.5 kg/m.   VTE ppx: SCDs Start: 10/06/23 2037  Code Status:  DNR Family Communication:  Disposition Plan: Status is: Inpatient   Consultants: cardiology, palliative    Subjective:  Patient seen and examined at the bedside.  Patient continues to be in A-fib RVR, blood pressure is stable.  She is not in any distress today.  Denies cough or shortness of breath.  Discussed with daughter at the bedside.  She has discussed with palliative care and will be meeting with hospice today Objective: Vitals:   10/09/23 0751 10/09/23 0808 10/09/23 1016 10/09/23 1118  BP: 112/85 110/80  101/71  Pulse: (!) 113 (!) 113  (!) 105  Resp: 20   19  Temp: (!) 97.2 F (36.2 C)  97.8 F (36.6 C) 97.7 F (36.5 C)  TempSrc: Axillary   Oral  SpO2:    95%  Weight:      Height:        Intake/Output Summary (Last 24 hours) at 10/09/2023 1124 Last data filed at 10/09/2023 0753 Gross per 24 hour  Intake 700 ml  Output 500 ml  Net 200 ml   Filed Weights   10/07/23 0300 10/08/23 0355 10/09/23 0317  Weight: 58.6 kg 58.2 kg 58.2 kg    Examination:  General: Ill looking, mild dyspnea Chest: Mild crackles bilaterally, no wheezing CVS: Tachycardic, irregular, murmur present Abdomen: Soft, nontender Extremities: No edema   Data Reviewed: I have personally reviewed following labs and imaging studies  CBC: Recent Labs  Lab 10/06/23 1805 10/07/23 0332 10/08/23 9072  WBC 10.8* 10.0 7.7  NEUTROABS 9.4* 8.9*  --   HGB 10.1* 9.5* 9.4*  HCT 32.0* 29.2* 28.8*  MCV 92.0 90.1 89.7  PLT 156 135* 128*   Basic Metabolic Panel: Recent Labs  Lab 10/06/23 1805 10/07/23 0332 10/08/23 0927  NA 130* 131* 133*  K 4.5 4.4 3.9  CL 98 100 98  CO2 21* 21* 20*  GLUCOSE 109* 118* 124*  BUN 24* 23 25*  CREATININE 1.40* 1.45* 1.71*  CALCIUM  9.1 9.0 8.9  MG 1.8 1.6*  --    GFR: Estimated Creatinine Clearance: 15.3 mL/min (A) (by C-G formula based on SCr of  1.71 mg/dL (H)). Liver Function Tests: Recent Labs  Lab 10/06/23 1805 10/07/23 0332  AST 21 24  ALT 16 18  ALKPHOS 73 79  BILITOT 1.2 1.6*  PROT 6.6 6.2*  ALBUMIN 4.0 3.8   No results for input(s): LIPASE, AMYLASE in the last 168 hours. No results for input(s): AMMONIA in the last 168 hours. Coagulation Profile: Recent Labs  Lab 10/06/23 1805  INR 1.2   Cardiac Enzymes: No results for input(s): CKTOTAL, CKMB, CKMBINDEX, TROPONINI in the last 168 hours. BNP (last 3 results) No results for input(s): PROBNP in the last 8760 hours. HbA1C: No results for input(s): HGBA1C in the last 72 hours. CBG: No results for input(s): GLUCAP in the last 168 hours. Lipid Profile: No results for input(s): CHOL, HDL, LDLCALC, TRIG, CHOLHDL, LDLDIRECT in the last 72 hours. Thyroid  Function Tests: Recent Labs    10/06/23 1805  TSH 5.258*   Anemia Panel: Recent Labs    10/07/23 0332  VITAMINB12 509   Sepsis Labs: Recent Labs  Lab 10/06/23 1805 10/06/23 1852 10/06/23 2022  PROCALCITON <0.10  --   --   LATICACIDVEN  --  1.1 1.2    Recent Results (from the past 240 hours)  Resp panel by RT-PCR (RSV, Flu A&B, Covid) Anterior Nasal Swab     Status: None   Collection Time: 10/06/23  6:05 PM   Specimen: Anterior Nasal Swab  Result Value Ref Range Status   SARS Coronavirus 2 by RT PCR NEGATIVE NEGATIVE Final   Influenza A by PCR NEGATIVE NEGATIVE Final   Influenza B by PCR NEGATIVE NEGATIVE Final    Comment: (NOTE) The Xpert Xpress SARS-CoV-2/FLU/RSV plus assay is intended as an aid in the diagnosis of influenza from Nasopharyngeal swab specimens and should not be used as a sole basis for treatment. Nasal washings and aspirates are unacceptable for Xpert Xpress SARS-CoV-2/FLU/RSV testing.  Fact Sheet for Patients: BloggerCourse.com  Fact Sheet for Healthcare Providers: SeriousBroker.it  This  test is not yet approved or cleared by the [place]  FDA and has been authorized for detection and/or diagnosis of SARS-CoV-2 by FDA under an Emergency Use Authorization (EUA). This EUA will remain in effect (meaning this test can be used) for the duration of the COVID-19 declaration under Section 564(b)(1) of the Act, 21 U.S.C. section 360bbb-3(b)(1), unless the authorization is terminated or revoked.     Resp Syncytial Virus by PCR NEGATIVE NEGATIVE Final    Comment: (NOTE) Fact Sheet for Patients: BloggerCourse.com  Fact Sheet for Healthcare Providers: SeriousBroker.it  This test is not yet approved or cleared by the [place]  FDA and has been authorized for detection and/or diagnosis of SARS-CoV-2 by FDA under an Emergency Use Authorization (EUA). This EUA will remain in effect (meaning this test can be used) for the duration of the COVID-19 declaration under Section 564(b)(1) of the  Act, 21 U.S.C. section 360bbb-3(b)(1), unless the authorization is terminated or revoked.  Performed at [facility], [address], [place], [place] [address]   Urine Culture     Status: None   Collection Time: 10/06/23  6:07 PM   Specimen: In/Out Cath Urine  Result Value Ref Range Status   Specimen Description IN/OUT CATH URINE  Final   Special Requests NONE  Final   Culture   Final    NO GROWTH Performed at [facility], [address] [place], [place] [address]    Report Status 10/07/2023 FINAL  Final  Blood Culture (routine x 2)     Status: None (Preliminary result)   Collection Time: 10/06/23  6:10 PM   Specimen: BLOOD  Result Value Ref Range Status   Specimen Description BLOOD LEFT ANTECUBITAL  Final   Special Requests   Final    BOTTLES DRAWN AEROBIC AND ANAEROBIC Blood Culture results may not be optimal due to an inadequate volume of blood received in culture bottles   Culture   Final    NO GROWTH 3  DAYS Performed at [facility], [address], [place], [place] [address]    Report Status PENDING  Incomplete  Blood Culture (routine x 2)     Status: None (Preliminary result)   Collection Time: 10/06/23  6:30 PM   Specimen: BLOOD RIGHT FOREARM  Result Value Ref Range Status   Specimen Description BLOOD RIGHT FOREARM  Final   Special Requests   Final    BOTTLES DRAWN AEROBIC AND ANAEROBIC Blood Culture results may not be optimal due to an inadequate volume of blood received in culture bottles   Culture   Final    NO GROWTH 3 DAYS Performed at [facility], [address] [place], [place] [address]    Report Status PENDING  Incomplete  MRSA Next Gen by PCR, Nasal     Status: None   Collection Time: 10/07/23  3:01 AM   Specimen: Nasal Mucosa; Nasal Swab  Result Value Ref Range Status   MRSA by PCR Next Gen NOT DETECTED NOT DETECTED Final    Comment: (NOTE) The GeneXpert MRSA Assay (FDA approved for NASAL specimens only), is one component of a comprehensive MRSA colonization surveillance program. It is not intended to diagnose MRSA infection nor to guide or monitor treatment for MRSA infections. Test performance is not FDA approved in patients less than 27 years old. Performed at [facility], [address] [place], [place] [address]          Radiology Studies: ECHOCARDIOGRAM COMPLETE Result Date: 10/07/2023    ECHOCARDIOGRAM REPORT   Patient Name:   RAYANNE PADMANABHAN [facility] Date of Exam: 10/07/2023 Medical Rec #:  [id number]            Height:       65.0 in Accession #:    [id number]           Weight:       129.2 lb Date of Birth:  1919-03-29            BSA:          1.643 m Patient Age:    89 years            BP:           144/101 mmHg Patient Gender: F  HR:           140 bpm. Exam Location:  Inpatient Procedure: 2D Echo, Cardiac Doppler and Color Doppler (Both Spectral and Color            Flow Doppler were utilized during procedure).  Indications:    Atrial Fibrillation I48.91  History:        Patient has prior history of Echocardiogram examinations, most                 recent 05/11/2023. CAD, TIA, Arrythmias:Atrial Fibrillation; Risk                 Factors:Hypertension and Dyslipidemia.  Sonographer:    Koleen Popper RDCS Referring Phys: [id number] JUSTIN B HOWERTER IMPRESSIONS  1. Left ventricular ejection fraction, by estimation, is 60 to 65%. The left ventricle has normal function. The left ventricle has no regional wall motion abnormalities. There is mild asymmetric left ventricular hypertrophy of the basal and septal segments. Left ventricular diastolic parameters are indeterminate.  2. Right ventricular systolic function is normal. The right ventricular size is normal. There is severely elevated pulmonary artery systolic pressure.  3. Left atrial size was moderately dilated.  4. The mitral valve is degenerative. Moderate to severe mitral valve regurgitation. No evidence of mitral stenosis. Severe mitral annular calcification.  5. Tricuspid valve regurgitation is moderate.  6. The aortic valve is tricuspid. There is moderate calcification of the aortic valve. There is moderate thickening of the aortic valve. Aortic valve regurgitation is mild. Moderate aortic valve stenosis.  7. The inferior vena cava is dilated in size with <50% respiratory variability, suggesting right atrial pressure of 15 mmHg. FINDINGS  Left Ventricle: Left ventricular ejection fraction, by estimation, is 60 to 65%. The left ventricle has normal function. The left ventricle has no regional wall motion abnormalities. Strain was performed and the global longitudinal strain is indeterminate. The left ventricular internal cavity size was normal in size. There is mild asymmetric left ventricular hypertrophy of the basal and septal segments. Left ventricular diastolic parameters are indeterminate. Right Ventricle: The right ventricular size is normal. No increase in right  ventricular wall thickness. Right ventricular systolic function is normal. There is severely elevated pulmonary artery systolic pressure. The tricuspid regurgitant velocity is 3.71 m/s, and with an assumed right atrial pressure of 15 mmHg, the estimated right ventricular systolic pressure is 70.1 mmHg. Left Atrium: Left atrial size was moderately dilated. Right Atrium: Right atrial size was normal in size. Pericardium: There is no evidence of pericardial effusion. Mitral Valve: The mitral valve is degenerative in appearance. There is moderate thickening of the mitral valve leaflet(s). There is moderate calcification of the mitral valve leaflet(s). Severe mitral annular calcification. Moderate to severe mitral valve regurgitation. No evidence of mitral valve stenosis. Tricuspid Valve: The tricuspid valve is normal in structure. Tricuspid valve regurgitation is moderate . No evidence of tricuspid stenosis. Aortic Valve: The aortic valve is tricuspid. There is moderate calcification of the aortic valve. There is moderate thickening of the aortic valve. Aortic valve regurgitation is mild. Moderate aortic stenosis is present. Aortic valve mean gradient measures 17.7 mmHg. Aortic valve peak gradient measures 31.3 mmHg. Aortic valve area, by VTI measures 1.07 cm. Pulmonic Valve: The pulmonic valve was normal in structure. Pulmonic valve regurgitation is mild. No evidence of pulmonic stenosis. Aorta: The aortic root is normal in size and structure. Venous: The inferior vena cava is dilated in size with less than 50% respiratory variability, suggesting right atrial  pressure of 15 mmHg. IAS/Shunts: No atrial level shunt detected by color flow Doppler. Additional Comments: 3D was performed not requiring image post processing on an independent workstation and was indeterminate.  LEFT VENTRICLE PLAX 2D LVIDd:         3.90 cm LVIDs:         3.00 cm LV PW:         1.00 cm LV IVS:        1.20 cm LVOT diam:     1.70 cm LV SV:          44 LV SV Index:   27 LVOT Area:     2.27 cm  LV Volumes (MOD) LV vol d, MOD A4C: 61.3 ml LV vol s, MOD A4C: 28.6 ml LV SV MOD A4C:     61.3 ml RIGHT VENTRICLE             IVC RV Basal diam:  3.50 cm     IVC diam: 2.40 cm RV S prime:     14.80 cm/s TAPSE (M-mode): 0.8 cm LEFT ATRIUM             Index        RIGHT ATRIUM           Index LA diam:        3.90 cm 2.37 cm/m   RA Area:     16.80 cm LA Vol (A2C):   51.6 ml 31.41 ml/m  RA Volume:   44.60 ml  27.15 ml/m LA Vol (A4C):   63.1 ml 38.41 ml/m LA Biplane Vol: 60.1 ml 36.58 ml/m  AORTIC VALVE AV Area (Vmax):    1.01 cm AV Area (Vmean):   0.99 cm AV Area (VTI):     1.07 cm AV Vmax:           279.67 cm/s AV Vmean:          196.333 cm/s AV VTI:            0.410 m AV Peak Grad:      31.3 mmHg AV Mean Grad:      17.7 mmHg LVOT Vmax:         124.00 cm/s LVOT Vmean:        85.300 cm/s LVOT VTI:          0.193 m LVOT/AV VTI ratio: 0.47  AORTA Ao Root diam: 2.80 cm Ao Asc diam:  3.00 cm TRICUSPID VALVE TR Peak grad:   55.1 mmHg TR Vmax:        371.00 cm/s  SHUNTS Systemic VTI:  0.19 m Systemic Diam: 1.70 cm Maude Emmer MD Electronically signed by Maude Emmer MD Signature Date/Time: 10/07/2023/12:47:28 PM    Final         Scheduled Meds:  atorvastatin   10 mg Oral QHS   clopidogrel   75 mg Oral Daily   doxycycline   100 mg Oral Q12H   furosemide   40 mg Intravenous BID   metoprolol  tartrate  25 mg Oral Q6H   pantoprazole   40 mg Oral q morning   Continuous Infusions:  amiodarone  30 mg/hr (10/08/23 2341)   cefTRIAXone  (ROCEPHIN )  IV 1 g (10/09/23 0808)          Derryl Duval, MD [facility] 10/09/2023, 11:24 AM

## 2023-10-09 NOTE — Progress Notes (Signed)
 OT Cancellation Note  Patient Details Name: HEIDEMARIE GOODNOW MRN: [id number] DOB: [birth year]   Cancelled Treatment:    Reason Eval/Treat Not Completed: Other (comment). Per secure chat with RN. Pt d/c to hospice, and at this time family does not wish for pt to have therapy during hospital admission. Can re-consult as necessary.   Taelon Bendorf C, OT  Acute Rehabilitation Services Office [phone] Secure chat preferred   Adrianne GORMAN Savers 10/09/2023, 9:20 AM

## 2023-10-09 NOTE — Progress Notes (Signed)
 Patients daughter asks that no one says the word hospice around the patient as this would make her very upset and agitated.

## 2023-10-09 NOTE — Progress Notes (Signed)
 Patient with episode of vomiting at 2300. Small amount of clear liquid. Family said patient had been coughing and suspected this may have triggered it especially with patient on empty stomach.  Zofran  given.

## 2023-10-09 NOTE — Progress Notes (Signed)
 Sheri Hensley [facility] hospital liaison note   Received request for family interest in hospice inpatient unit. Visited patient and spoke with family in person to discuss services and hospice philosophy of care.    Chart reviewed and at this time patient does not meet criteria for hospice inpatient unit. May become appropriate after transitioning to full comfort care. [facility] to continue to follow for decline and reassess for inpatient unit appropriateness .     Thank you for the opportunity to participate in this patient's care.    Nat Babe, BSN, [facility] Liaison [phone]

## 2023-10-09 NOTE — Progress Notes (Signed)
 Daily Progress Note   Patient Name: Sheri Hensley       Date: 10/09/2023 DOB: 12-Jan-1920  Age: 89 y.o. MRN#: [id number] Attending Physician: Mcarthur Pick, MD Primary Care Physician: Charlott Dorn LABOR, MD Admit Date: 10/06/2023  Reason for Consultation/Follow-up: Establishing goals of care  Subjective: I have reviewed medical records including EPIC notes, MAR, any available advanced directives as necessary, and labs. Received report from primary RN - no acute concerns. RN reports patient is now comfort care with plan to transfer to [facility].  Went to visit patient at bedside - daughter/Vickie and friend present. Patient was lying in bed awake. No signs or non-verbal gestures of pain or discomfort noted. No respiratory distress, increased work of breathing, or secretions noted.   Met with daughter in hallway for privacy as family has requesting not to use the word hospice in front of patient. Emotional support provided to Vickie.  We talked about transition to comfort measures in house and what that would entail inclusive of medications to control pain, dyspnea, agitation, nausea, and itching. We discussed stopping all unnecessary measures such as blood draws, needle sticks, oxygen, antibiotics, CBGs/insulin , cardiac monitoring, IVF, and frequent vital signs. Education provided that other non-pharmacological interventions would be utilized for holistic support and comfort such as spiritual support if requested, repositioning, music therapy, offering comfort feeds, and/or therapeutic listening. All care would focus on how the patient is looking and feeling. Vickie confirms goals for transition to full comfort today; however, would like to continue lasix  and metoprolol  for comfort, which  is reasonable.  She confirms goal is for patient's transfer to [facility] once bed is available - they have already spoken with hospice liaison this morning and patient is currently being evaluated.  Vickie has a clear understanding of patient's current acute medical situation. Therapeutic listening provided as she reflects in patient's interval history since admission.  All questions and concerns addressed. Encouraged to call with questions and/or concerns. PMT card provided.  Length of Stay: 3  Current Medications: Scheduled Meds:   doxycycline   100 mg Oral Q12H   furosemide   40 mg Intravenous BID   metoprolol  tartrate  50 mg Oral BID   pantoprazole   40 mg Oral q morning    Continuous Infusions:  cefTRIAXone  (ROCEPHIN )  IV 1 g (10/09/23 0808)  PRN Meds: acetaminophen  **OR** acetaminophen , haloperidol  lactate, levalbuterol , melatonin, ondansetron  (ZOFRAN ) IV  Physical Exam Vitals and nursing note reviewed.  Constitutional:      General: She is not in acute distress. Pulmonary:     Effort: No respiratory distress.  Skin:    General: Skin is warm and dry.  Neurological:     Mental Status: She is alert.     Motor: Weakness present.  Psychiatric:        Behavior: Behavior is cooperative.             Vital Signs: BP 118/70   Pulse (!) 105   Temp 97.7 F (36.5 C) (Oral)   Resp 19   Ht 5' 5 (1.651 m)   Wt 58.2 kg   LMP  (LMP Unknown)   SpO2 95%   BMI 21.35 kg/m  SpO2: SpO2: 95 % O2 Device: O2 Device: Room Air O2 Flow Rate:    Intake/output summary:  Intake/Output Summary (Last 24 hours) at 10/09/2023 1313 Last data filed at 10/09/2023 0753 Gross per 24 hour  Intake 700 ml  Output --  Net 700 ml   LBM:   Baseline Weight: Weight: 55.8 kg Most recent weight: Weight: 58.2 kg       Palliative Assessment/Data: PPS 20%      Patient Active Problem List   Diagnosis Date Noted   Palliative care encounter 10/08/2023   Atrial fibrillation with RVR (HCC)  10/06/2023   Generalized weakness 10/06/2023   History of essential hypertension 10/06/2023   Anemia of chronic disease 10/06/2023   Insomnia 04/28/2021   Depression 04/28/2021   Abnormal radiologic findings on diagnostic imaging of right testicle 02/23/2021   History of cancer of uterine body 02/23/2021   AKI (acute kidney injury) (HCC) 08/29/2020   AF (paroxysmal atrial fibrillation) (HCC) 08/29/2020   Acute hyponatremia 08/29/2020   Closed right hip fracture (HCC) 08/28/2020   Acquired absence of both cervix and uterus 03/11/2020   Acquired thrombophilia (HCC) 03/11/2020   Chronic kidney disease due to hypertension 03/11/2020   Constipation 03/11/2020   Frailty 03/11/2020   Herpes zoster without complication 03/11/2020   Overactive bladder 03/11/2020   Standard chest x-ray abnormal 03/11/2020   Type 2 diabetes mellitus with other specified complication (HCC) 03/11/2020   Vitamin D  deficiency 03/11/2020   Atrial fibrillation (HCC)    Bruises easily    Coronary artery disease    GERD (gastroesophageal reflux disease)    Glaucoma, both eyes    History of breast cancer    History of Clostridium difficile infection    HLD (hyperlipidemia)    Hypertension    Macular degeneration    Narrow complex tachycardia (HCC)    OA (osteoarthritis)    PMB (postmenopausal bleeding)    PONV (postoperative nausea and vomiting)    Wears glasses    Acute respiratory failure with hypoxia (HCC) 01/08/2019   COVID-19 virus infection 01/08/2019   Pneumonia due to COVID-19 virus 01/08/2019   Falls 06/2017   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   Paroxysmal atrial fibrillation (HCC) 12/23/2015   Hyponatremia 12/23/2015   Elevated bilirubin 12/23/2015   TIA (transient ischemic attack) 12/22/2015   Personal history of transient ischemic attack (TIA), and cerebral infarction without residual deficits 12/22/2015   Endometrial cancer (HCC) 11/21/2015   Postmenopausal bleeding  11/10/2015   Thickened endometrium 11/10/2015   Essential (primary) hypertension 10/24/2014   Essential hypertension 10/24/2014   Post-traumatic osteoarthritis of both knees 10/15/2013   Cancer (  HCC) 1998    Palliative Care Assessment & Plan   Patient Profile: Sheri Hensley is a 89 y.o. female with multiple medical problems including A-fib not on anticoagulation, diastolic dysfunction, moderate to severe valvular heart disease, anemia of chronic disease, who was admitted the hospital on 10/06/2023 with A-fib with RVR.  Palliative care was consulted to address goals.   Assessment: Principal Problem:   Atrial fibrillation with RVR (HCC) Active Problems:   HLD (hyperlipidemia)   Acute hyponatremia   Depression   Generalized weakness   History of essential hypertension   Anemia of chronic disease   Palliative care encounter   Terminal care  Recommendations/Plan: Initiated full comfort measures Continue DNR/DNI as previously documented - durable DNR form completed and placed in shadow chart. Copy was made and will be scanned into Vynca/ACP tab Transfer to [facility] pending confirmation of eligibility and bed availability - hospice liaison and TOC already aware Added orders for EOL symptom management as noted below, as well as discontinued orders that were not focused on comfort Unrestricted visitation orders were placed per current [place] EOL visitation policy  Nursing to provide frequent assessments and administer PRN medications as clinically necessary to ensure EOL comfort PMT will continue to follow and support holistically  Symptom Management Dilaudid  PRN pain/dyspnea/increased work of breathing/RR>25 Oxycodone  PRN moderate pain Tylenol  PRN pain/fever Biotin twice daily Benadryl  PRN itching Robinul  PRN secretions Haldol  PRN agitation/delirium Ativan  PRN anxiety/seizure/sleep/distress Zofran  PRN nausea/vomiting Liquifilm Tears PRN dry eye Continue lasix  and  metoprolol  for comfort   Goals of Care and Additional Recommendations: Limitations on Scope of Treatment: Full Comfort Care  Code Status:    Code Status Orders  (From admission, onward)           Start     Ordered   10/07/23 1712  Do not attempt resuscitation (DNR)- Limited -Do Not Intubate (DNI)  (Code Status)  Continuous       Question Answer Comment  If pulseless and not breathing No CPR or chest compressions.   In Pre-Arrest Conditions (Patient Is Breathing and Has A Pulse) Do not intubate. Provide all appropriate non-invasive medical interventions. Avoid ICU transfer unless indicated or required.   Consent: Discussion documented in EHR or advanced directives reviewed      10/07/23 1711           Code Status History     Date Active Date Inactive Code Status Order ID Comments User Context   10/06/2023 2036 10/07/2023 1711 Full Code [id number]  Marcene Eva NOVAK, DO ED   08/29/2020 0022 09/05/2020 1931 Full Code [id number]  Chotiner, Adine RAMAN, MD ED   01/10/2019 1744 01/18/2019 1949 DNR [id number]  Davia Nydia POUR, MD Inpatient   01/08/2019 0856 01/10/2019 1744 DNR [id number]  Sherrill Alejandro Donovan, DO Inpatient   01/27/2016 1256 01/28/2016 1626 Full Code [id number]  Eloy Herring, MD Inpatient   12/23/2015 0321 12/23/2015 2105 DNR [id number]  Jonel Lonni SQUIBB, MD ED       Prognosis:  < 2 weeks  Discharge Planning: Hospice facility  Care plan was discussed with primary RN, patient's daughter  Thank you for allowing the Palliative Medicine Team to assist in the care of this patient.     Jeoffrey CHRISTELLA Sharps, NP  Please contact Palliative Medicine Team phone at [phone] for questions and concerns.   *Portions of this note are a verbal dictation therefore any spelling and/or grammatical errors are due to the Dragon Medical One system interpretation.

## 2023-10-09 NOTE — Progress Notes (Signed)
  Progress Note  Patient Name: Sheri Hensley Date of Encounter: 10/09/2023 [facility] Cardiologist: Jennifer JONELLE Crape, MD   Interval Summary   There really has not been any clinical improvement.  Remains in atrial fibrillation rapid ventricular response around 120 bpm. Very mild dyspnea at rest, but becomes short of breath with minimal mobilization.  Vital Signs Vitals:   10/09/23 0317 10/09/23 0751 10/09/23 0808 10/09/23 1016  BP: 113/82 112/85 110/80   Pulse: (!) 111 (!) 113 (!) 113   Resp: (!) 22 20    Temp:  (!) 97.2 F (36.2 C)  97.8 F (36.6 C)  TempSrc:  Axillary    SpO2: (!) 88%     Weight: 58.2 kg     Height:        Intake/Output Summary (Last 24 hours) at 10/09/2023 1054 Last data filed at 10/09/2023 0753 Gross per 24 hour  Intake 700 ml  Output 500 ml  Net 200 ml      10/09/2023    3:17 AM 10/08/2023    3:55 AM 10/07/2023    3:00 AM  Last 3 Weights  Weight (lbs) 128 lb 4.9 oz 128 lb 4.9 oz 129 lb 3 oz  Weight (kg) 58.2 kg 58.2 kg 58.6 kg      Telemetry/ECG  A-fib with RVR- Personally Reviewed  Physical Exam  GEN: No acute distress.   Neck: No JVD Cardiac: Irregular, 3/6 apical holosystolic murmur radiating broadly towards the axilla and the base, no diastolic murmurs, rubs, or gallops.  Respiratory: Clear to auscultation bilaterally. GI: Soft, nontender, non-distended  MS: No edema  Assessment & Plan  89 year old woman with history of paroxysmal atrial fibrillation, severe MR, admitted with acute on chronic HFpEF, acute hypoxic respiratory failure, possibly also due to pneumonia.  After meeting with the palliative care team, the patient and her family have decided on transitioning her to comfort care at [facility].  Hard to say whether the intravenous amiodarone  and the oral metoprolol  have really made much of a difference in her symptoms, so they are unlikely to be of much value going forward.  Can consolidate metoprolol  to twice  daily dosing.  Stopping the amiodarone  will also not have any immediate effect since it is such a long-acting drug, so we will discontinue IV amiodarone  and not restarted as an oral medication.  I would continue furosemide  for symptom relief (can consolidate to furosemide  80 mg once daily), but would definitely discontinue amlodipine , clopidogrel , losartan , atorvastatin .  [facility] will sign off.   The patient is ready for discharge today from a cardiac standpoint. Medication Recommendations: Furosemide  80 mg once daily, metoprolol  tartrate 50 mg twice daily Stop amiodarone , amlodipine , clopidogrel , losartan , atorvastatin  Other recommendations (labs, testing, etc): Further diagnostic evaluation not appropriate Follow up as an outpatient: She will transfer to [facility] For questions or updates, please contact [facility] Please consult [email or web address] for contact info under         Signed, Jerel Balding, MD

## 2023-10-10 DIAGNOSIS — Z515 Encounter for palliative care: Secondary | ICD-10-CM | POA: Diagnosis not present

## 2023-10-10 DIAGNOSIS — N390 Urinary tract infection, site not specified: Secondary | ICD-10-CM | POA: Diagnosis not present

## 2023-10-10 DIAGNOSIS — R638 Other symptoms and signs concerning food and fluid intake: Secondary | ICD-10-CM

## 2023-10-10 DIAGNOSIS — J189 Pneumonia, unspecified organism: Secondary | ICD-10-CM | POA: Diagnosis not present

## 2023-10-10 DIAGNOSIS — I4891 Unspecified atrial fibrillation: Secondary | ICD-10-CM | POA: Diagnosis not present

## 2023-10-10 MED ORDER — HALOPERIDOL LACTATE 5 MG/ML IJ SOLN: 2.0000 mg | Freq: Four times a day (QID) | INTRAMUSCULAR | Status: DC | PRN

## 2023-10-10 MED ORDER — GLYCOPYRROLATE 0.2 MG/ML IJ SOLN: 0.2000 mg | INTRAMUSCULAR | Status: DC | PRN

## 2023-10-10 MED ORDER — HALOPERIDOL 2 MG PO TABS: 2.0000 mg | ORAL_TABLET | Freq: Four times a day (QID) | ORAL | Status: DC | PRN

## 2023-10-10 MED ORDER — ACETAMINOPHEN 325 MG PO TABS: 650.0000 mg | ORAL_TABLET | Freq: Four times a day (QID) | ORAL | Status: DC | PRN

## 2023-10-10 MED ORDER — HYDROMORPHONE HCL 1 MG/ML IJ SOLN: 0.5000 mg | INTRAMUSCULAR | Status: DC | PRN

## 2023-10-10 MED ORDER — ACETAMINOPHEN 650 MG RE SUPP: 650.0000 mg | Freq: Four times a day (QID) | RECTAL | Status: DC | PRN

## 2023-10-10 MED ORDER — BIOTENE DRY MOUTH MT LIQD: 15.0000 mL | Freq: Two times a day (BID) | OROMUCOSAL | Status: DC

## 2023-10-10 MED ORDER — LEVALBUTEROL HCL 1.25 MG/0.5ML IN NEBU: 1.2500 mg | INHALATION_SOLUTION | RESPIRATORY_TRACT | Status: DC | PRN

## 2023-10-10 MED ORDER — GLYCOPYRROLATE 1 MG PO TABS: 1.0000 mg | ORAL_TABLET | ORAL | Status: DC | PRN

## 2023-10-10 MED ORDER — OXYCODONE HCL 5 MG PO TABS: 5.0000 mg | ORAL_TABLET | ORAL | Status: DC | PRN

## 2023-10-10 MED ORDER — LORAZEPAM 2 MG/ML PO CONC: 1.0000 mg | ORAL | Status: DC | PRN

## 2023-10-10 MED ORDER — LORAZEPAM 2 MG/ML IJ SOLN: 1.0000 mg | INTRAMUSCULAR | Status: DC | PRN

## 2023-10-10 MED ORDER — ONDANSETRON HCL 4 MG/2ML IJ SOLN: 4.0000 mg | Freq: Four times a day (QID) | INTRAMUSCULAR | Status: DC | PRN

## 2023-10-10 MED ORDER — POLYVINYL ALCOHOL 1.4 % OP SOLN: 1.0000 [drp] | Freq: Four times a day (QID) | OPHTHALMIC | Status: DC | PRN

## 2023-10-10 MED ORDER — LORAZEPAM 1 MG PO TABS: 1.0000 mg | ORAL_TABLET | ORAL | Status: DC | PRN

## 2023-10-10 MED ORDER — HALOPERIDOL LACTATE 2 MG/ML PO CONC: 2.0000 mg | Freq: Four times a day (QID) | ORAL | Status: DC | PRN

## 2023-10-10 MED ORDER — ONDANSETRON 4 MG PO TBDP: 4.0000 mg | ORAL_TABLET | Freq: Four times a day (QID) | ORAL | Status: DC | PRN

## 2023-10-10 NOTE — Plan of Care (Signed)
   Problem: Coping: Goal: Level of anxiety will decrease Outcome: Progressing   Problem: Pain Managment: Goal: General experience of comfort will improve and/or be controlled Outcome: Progressing   Problem: Safety: Goal: Ability to remain free from injury will improve Outcome: Progressing

## 2023-10-10 NOTE — Care Management Important Message (Signed)
 Important Message  Patient Details  Name: Sheri Hensley MRN: [id number] Date of Birth: [birth year]   Important Message Given:  Yes - Medicare IM     Jon Cruel 10/10/2023, 2:48 PM

## 2023-10-10 NOTE — Plan of Care (Signed)

## 2023-10-10 NOTE — Progress Notes (Addendum)
 [facility] arrived to transfer patient to [facility]. Daughters at bedside and received discharge papers. IV stayed in place per discharge request but removed from LDA for AVS completion.

## 2023-10-10 NOTE — Progress Notes (Signed)
 PROGRESS NOTE    Sheri Hensley  FMW:[id number] DOB: 27-Nov-1919 DOA: 10/06/2023 PCP: Charlott Dorn LABOR, MD   Brief Narrative:    Assessment & Plan:   Principal Problem:   Atrial fibrillation with RVR (HCC) Active Problems:   HLD (hyperlipidemia)   Acute hyponatremia   Depression   Generalized weakness   History of essential hypertension   Anemia of chronic disease   Palliative care encounter  Sheri Hensley is a 89 y.o. female with a PMH significant for paroxysmal atrial fibrillation not on anticoagulation, hyperlipidemia, chronic diastolic heart failure, moderate to severe mitral digitation, anemia of chronic disease with baseline hemoglobin 9-11, who is admitted to [facility] on 10/06/2023 with atrial fibrillation with RVR after presenting from home to [facility] ED complaining of generalized weakness. Likely exacerbated by CAP and MVR.    10/07/23 -cardiology has been consulted for Afib management. Continues on IV Abx.  Plan to continue current management, if deterioration, hospice is recommended 10/08/20-transition to hospice/comfort care   Assessment and plan: Atrial fibrillation with RVR: difficult to manage.  Moderate MR, moderate aortic stenosis Chronic diastolic heart failure - Continued A-fib RVR despite amiodarone  drip.  Contributed by pneumonia/severe valvular disease.   Acute on chronic diastolic heart failure, received IV diuretics, creatinine is an increasing trend. Family have decided for comfort care/hospice.  Sounds like they would like to continue Lasix . Will use medications only for comfort.  CKD: Worsening creatinine noted   Mild hyponatremia-stable per most recent labs.  Comfort care   #) Hyperlipidemia:   Will discontinue statin in favor of comfort care     #) Depression:  - discontinue Zoloft   #) Anemia of chronic disease:    Body mass index is 21.5 kg/m.   VTE ppx: SCDs Start: 10/06/23 2037  Code Status:  DNR Family  Communication: Discussed with daughter, granddaughter at the bedside Disposition Plan: Hospice inpatient   Consultants: cardiology, palliative    Subjective:  Patient seen and examined at the bedside.  She was sleeping, appeared comfortable.  Remains tachycardic not in any acute distress.  Family has no complaints and would like to thank hospital staff for the help  Objective: Vitals:   10/09/23 1146 10/09/23 1745 10/09/23 2110 10/10/23 0836  BP: 118/70 (!) 111/91 (!) 107/54 (!) 114/90  Pulse: (!) 105 (!) 113 62 (!) 128  Resp:  (!) 22 18 (!) 22  Temp:  97.8 F (36.6 C) 98.1 F (36.7 C) 97.6 F (36.4 C)  TempSrc:  Oral Oral Axillary  SpO2:  92%  93%  Weight:      Height:        Intake/Output Summary (Last 24 hours) at 10/10/2023 1408 Last data filed at 10/10/2023 0946 Gross per 24 hour  Intake 120 ml  Output 400 ml  Net -280 ml   Filed Weights   10/07/23 0300 10/08/23 0355 10/09/23 0317  Weight: 58.6 kg 58.2 kg 58.2 kg    Examination:  General: Ill looking, mild dyspnea Chest: Mild crackles bilaterally, no wheezing CVS: Tachycardic, irregular, murmur present Abdomen: Soft, nontender Extremities: No edema   Data Reviewed: I have personally reviewed following labs and imaging studies  CBC: Recent Labs  Lab 10/06/23 1805 10/07/23 0332 10/08/23 0927  WBC 10.8* 10.0 7.7  NEUTROABS 9.4* 8.9*  --   HGB 10.1* 9.5* 9.4*  HCT 32.0* 29.2* 28.8*  MCV 92.0 90.1 89.7  PLT 156 135* 128*   Basic Metabolic Panel: Recent Labs  Lab 10/06/23 1805 10/07/23  9667 10/08/23 0927 10/09/23 1210  NA 130* 131* 133* 129*  K 4.5 4.4 3.9 3.4*  CL 98 100 98 91*  CO2 21* 21* 20* 20*  GLUCOSE 109* 118* 124* 298*  BUN 24* 23 25* 27*  CREATININE 1.40* 1.45* 1.71* 1.92*  CALCIUM  9.1 9.0 8.9 8.3*  MG 1.8 1.6*  --   --    GFR: Estimated Creatinine Clearance: 13.7 mL/min (A) (by C-G formula based on SCr of 1.92 mg/dL (H)). Liver Function Tests: Recent Labs  Lab 10/06/23 1805  10/07/23 0332  AST 21 24  ALT 16 18  ALKPHOS 73 79  BILITOT 1.2 1.6*  PROT 6.6 6.2*  ALBUMIN 4.0 3.8   No results for input(s): LIPASE, AMYLASE in the last 168 hours. No results for input(s): AMMONIA in the last 168 hours. Coagulation Profile: Recent Labs  Lab 10/06/23 1805  INR 1.2   Cardiac Enzymes: No results for input(s): CKTOTAL, CKMB, CKMBINDEX, TROPONINI in the last 168 hours. BNP (last 3 results) No results for input(s): PROBNP in the last 8760 hours. HbA1C: No results for input(s): HGBA1C in the last 72 hours. CBG: Recent Labs  Lab 10/09/23 1742  GLUCAP 131*   Lipid Profile: No results for input(s): CHOL, HDL, LDLCALC, TRIG, CHOLHDL, LDLDIRECT in the last 72 hours. Thyroid  Function Tests: No results for input(s): TSH, T4TOTAL, FREET4, T3FREE, THYROIDAB in the last 72 hours.  Anemia Panel: No results for input(s): VITAMINB12, FOLATE, FERRITIN, TIBC, IRON , RETICCTPCT in the last 72 hours.  Sepsis Labs: Recent Labs  Lab 10/06/23 1805 10/06/23 1852 10/06/23 2022  PROCALCITON <0.10  --   --   LATICACIDVEN  --  1.1 1.2    Recent Results (from the past 240 hours)  Resp panel by RT-PCR (RSV, Flu A&B, Covid) Anterior Nasal Swab     Status: None   Collection Time: 10/06/23  6:05 PM   Specimen: Anterior Nasal Swab  Result Value Ref Range Status   SARS Coronavirus 2 by RT PCR NEGATIVE NEGATIVE Final   Influenza A by PCR NEGATIVE NEGATIVE Final   Influenza B by PCR NEGATIVE NEGATIVE Final    Comment: (NOTE) The Xpert Xpress SARS-CoV-2/FLU/RSV plus assay is intended as an aid in the diagnosis of influenza from Nasopharyngeal swab specimens and should not be used as a sole basis for treatment. Nasal washings and aspirates are unacceptable for Xpert Xpress SARS-CoV-2/FLU/RSV testing.  Fact Sheet for Patients: BloggerCourse.com  Fact Sheet for Healthcare  Providers: SeriousBroker.it  This test is not yet approved or cleared by the [place]  FDA and has been authorized for detection and/or diagnosis of SARS-CoV-2 by FDA under an Emergency Use Authorization (EUA). This EUA will remain in effect (meaning this test can be used) for the duration of the COVID-19 declaration under Section 564(b)(1) of the Act, 21 U.S.C. section 360bbb-3(b)(1), unless the authorization is terminated or revoked.     Resp Syncytial Virus by PCR NEGATIVE NEGATIVE Final    Comment: (NOTE) Fact Sheet for Patients: BloggerCourse.com  Fact Sheet for Healthcare Providers: SeriousBroker.it  This test is not yet approved or cleared by the [place]  FDA and has been authorized for detection and/or diagnosis of SARS-CoV-2 by FDA under an Emergency Use Authorization (EUA). This EUA will remain in effect (meaning this test can be used) for the duration of the COVID-19 declaration under Section 564(b)(1) of the Act, 21 U.S.C. section 360bbb-3(b)(1), unless the authorization is terminated or revoked.  Performed at [facility], [address], [place], [place] [address]   Urine Culture     Status: None   Collection Time: 10/06/23  6:07 PM   Specimen: In/Out Cath Urine  Result Value Ref Range Status   Specimen Description IN/OUT CATH URINE  Final   Special Requests NONE  Final   Culture   Final    NO GROWTH Performed at [facility], [address] [place], [place] [address]    Report Status 10/07/2023 FINAL  Final  Blood Culture (routine x 2)     Status: None (Preliminary result)   Collection Time: 10/06/23  6:10 PM   Specimen: BLOOD  Result Value Ref Range Status   Specimen Description BLOOD LEFT ANTECUBITAL  Final   Special Requests   Final    BOTTLES DRAWN AEROBIC AND ANAEROBIC Blood Culture results may not be optimal due to an inadequate volume of blood received  in culture bottles   Culture   Final    NO GROWTH 4 DAYS Performed at [facility], [address] [place], [place] [address]    Report Status PENDING  Incomplete  Blood Culture (routine x 2)     Status: None (Preliminary result)   Collection Time: 10/06/23  6:30 PM   Specimen: BLOOD RIGHT FOREARM  Result Value Ref Range Status   Specimen Description BLOOD RIGHT FOREARM  Final   Special Requests   Final    BOTTLES DRAWN AEROBIC AND ANAEROBIC Blood Culture results may not be optimal due to an inadequate volume of blood received in culture bottles   Culture   Final    NO GROWTH 4 DAYS Performed at [facility], [address], [place], [place] [address]    Report Status PENDING  Incomplete  MRSA Next Gen by PCR, Nasal     Status: None   Collection Time: 10/07/23  3:01 AM   Specimen: Nasal Mucosa; Nasal Swab  Result Value Ref Range Status   MRSA by PCR Next Gen NOT DETECTED NOT DETECTED Final    Comment: (NOTE) The GeneXpert MRSA Assay (FDA approved for NASAL specimens only), is one component of a comprehensive MRSA colonization surveillance program. It is not intended to diagnose MRSA infection nor to guide or monitor treatment for MRSA infections. Test performance is not FDA approved in patients less than 37 years old. Performed at [facility], [address] [place], [place] [address]          Radiology Studies: No results found.       Scheduled Meds:  antiseptic oral rinse  15 mL Topical BID   furosemide   40 mg Intravenous BID   Continuous Infusions:          Herlinda Heady, MD [facility] 10/10/2023, 2:08 PM

## 2023-10-10 NOTE — Progress Notes (Signed)
 Report given to [facility] at [facility].  IV will remain in place at time of discharge per request.

## 2023-10-10 NOTE — Discharge Summary (Signed)
 Physician Discharge Summary  Sheri Hensley FMW:[id number] DOB: [birth year] DOA: 10/06/2023  PCP: Charlott Dorn LABOR, MD  Admit date: 10/06/2023 Discharge date: 10/10/2023  Admitted From: Home Disposition: [facility]/hospice Discharge Condition: critical CODE STATUS: DNR Diet recommendation: as tolerated  Brief/Interim Summary: Sheri Hensley is a 89 y.o. female with a PMH significant for paroxysmal atrial fibrillation not on anticoagulation, hyperlipidemia, chronic diastolic heart failure, moderate to severe MR, anemia of chronic disease with baseline hemoglobin 9-11, who is admitted to [facility] on 10/06/2023 with atrial fibrillation with RVR after presenting from home to [facility] ED complaining of generalized weakness.  She was also noted to have infiltrates on x-ray/pulmonary edema.    Cardiology consisted consulted and assisted in management of the patient.  She was initiated on IV amiodarone  as well as p.o. metoprolol  despite fluids heart rate continued to be high.  She was also diuresed with IV Lasix  with some urine output and unfortunately increasing creatinine.  She was continued on IV antibiotics for pneumonia.  Given advanced age and her severe valvular disease and difficult to control A-fib cardiology recommended palliative care consulted and possible hospice.  Family then decided for hospice/comfort care on 10/09/2023.    Code Status:  DNR Family Communication: Discussed with daughter, granddaughter at the bedside Disposition Plan: [facility]   Consultants: cardiology, palliative  Discharge Diagnoses:  Principal Problem:   Atrial fibrillation with RVR (HCC) Active Problems:   HLD (hyperlipidemia)   Acute hyponatremia   Depression   Generalized weakness   History of essential hypertension   Anemia of chronic disease   Palliative care encounter    Discharge Instructions  Discharge Instructions     Diet general   Complete by: As directed    As  tolerated   Discharge instructions   Complete by: As directed    1. Continue comfort care per hospice      Allergies as of 10/10/2023       Reactions   Ativan  [lorazepam ] Other (See Comments)   HALLUCINATIONS   Lorazepam  Other (See Comments)   Hallucinations   Metronidazole Rash   Other reaction(s): rash        Medication List     STOP taking these medications    amiodarone  200 MG tablet Commonly known as: PACERONE    amLODipine  2.5 MG tablet Commonly known as: NORVASC    atorvastatin  10 MG tablet Commonly known as: LIPITOR   brimonidine  0.2 % ophthalmic solution Commonly known as: ALPHAGAN    CENTRUM VITAMINTS PO   clopidogrel  75 MG tablet Commonly known as: PLAVIX    cyanocobalamin  1000 MCG tablet Commonly known as: VITAMIN B12   estradiol 0.1 MG/GM vaginal cream Commonly known as: ESTRACE   latanoprost  0.005 % ophthalmic solution Commonly known as: XALATAN    loratadine  10 MG tablet Commonly known as: CLARITIN    losartan  25 MG tablet Commonly known as: COZAAR    melatonin 5 MG Tabs   metoprolol  tartrate 25 MG tablet Commonly known as: LOPRESSOR    ondansetron  4 MG tablet Commonly known as: ZOFRAN  Replaced by: ondansetron  4 MG/2ML Soln injection   pantoprazole  40 MG tablet Commonly known as: PROTONIX    PreserVision AREDS 2 Caps   PROBIOTIC PO   QUEtiapine 25 MG tablet Commonly known as: SEROQUEL   sertraline  50 MG tablet Commonly known as: ZOLOFT    Slow Fe 142 (45 Fe) MG Tbcr tablet Generic drug: ferrous sulfate ER   Vitamin D  50 MCG (2000 UT) tablet       TAKE these  medications    acetaminophen  325 MG tablet Commonly known as: TYLENOL  Take 2 tablets (650 mg total) by mouth every 6 (six) hours as needed for mild pain (pain score 1-3) (or Fever >/= 101). What changed:  medication strength how much to take reasons to take this   acetaminophen  650 MG suppository Commonly known as: TYLENOL  Place 1 suppository (650 mg total)  rectally every 6 (six) hours as needed for mild pain (pain score 1-3) (or Fever >/= 101). What changed: You were already taking a medication with the same name, and this prescription was added. Make sure you understand how and when to take each.   antiseptic oral rinse Liqd Apply 15 mLs topically 2 (two) times daily.   artificial tears ophthalmic solution Place 1 drop into both eyes 4 (four) times daily as needed for dry eyes.   glycopyrrolate  1 MG tablet Commonly known as: ROBINUL  Take 1 tablet (1 mg total) by mouth every 4 (four) hours as needed (excessive secretions).   glycopyrrolate  0.2 MG/ML injection Commonly known as: ROBINUL  Inject 1 mL (0.2 mg total) into the skin every 4 (four) hours as needed (excessive secretions).   glycopyrrolate  0.2 MG/ML injection Commonly known as: ROBINUL  Inject 1 mL (0.2 mg total) into the vein every 4 (four) hours as needed (excessive secretions).   haloperidol  2 MG tablet Commonly known as: HALDOL  Take 1 tablet (2 mg total) by mouth every 6 (six) hours as needed for agitation (or delirium).   haloperidol  2 MG/ML solution Commonly known as: HALDOL  Place 1 mL (2 mg total) under the tongue every 6 (six) hours as needed for agitation (or delirium).   haloperidol  lactate 5 MG/ML injection Commonly known as: HALDOL  Inject 0.4 mLs (2 mg total) into the vein every 6 (six) hours as needed (or delirium).   HYDROmorphone  1 MG/ML injection Commonly known as: DILAUDID  Inject 0.5-1 mLs (0.5-1 mg total) into the vein every 2 (two) hours as needed for severe pain (pain score 7-10) (increased work of breathing, RR >25, distress, dyspnea).   levalbuterol  1.25 MG/0.5ML nebulizer solution Commonly known as: XOPENEX  Take 1.25 mg by nebulization every 4 (four) hours as needed for wheezing or shortness of breath.   LORazepam  1 MG tablet Commonly known as: ATIVAN  Take 1 tablet (1 mg total) by mouth every hour as needed for anxiety, seizure or sleep  (distress).   LORazepam  2 MG/ML concentrated solution Commonly known as: ATIVAN  Place 0.5 mLs (1 mg total) under the tongue every hour as needed for anxiety, seizure or sleep (distress).   LORazepam  2 MG/ML injection Commonly known as: ATIVAN  Inject 0.5 mLs (1 mg total) into the vein every hour as needed for anxiety or seizure (distress).   ondansetron  4 MG disintegrating tablet Commonly known as: ZOFRAN -ODT Take 1 tablet (4 mg total) by mouth every 6 (six) hours as needed for nausea.   ondansetron  4 MG/2ML Soln injection Commonly known as: ZOFRAN  Inject 2 mLs (4 mg total) into the vein every 6 (six) hours as needed for nausea. Replaces: ondansetron  4 MG tablet   oxyCODONE  5 MG immediate release tablet Commonly known as: Oxy IR/ROXICODONE  Take 1 tablet (5 mg total) by mouth every 4 (four) hours as needed for moderate pain (pain score 4-6) (or dyspnea).        Allergies  Allergen Reactions   Ativan  [Lorazepam ] Other (See Comments)    HALLUCINATIONS   Lorazepam  Other (See Comments)    Hallucinations   Metronidazole Rash    Other reaction(s):  rash    Consultations:    Procedures/Studies: ECHOCARDIOGRAM COMPLETE Result Date: 10/07/2023    ECHOCARDIOGRAM REPORT   Patient Name:   [facility] Date of Exam: 10/07/2023 Medical Rec #:  [id number]            Height:       65.0 in Accession #:    [id number]           Weight:       129.2 lb Date of Birth:  [birth year]            BSA:          1.643 m Patient Age:    89 years            BP:           144/101 mmHg Patient Gender: F                    HR:           140 bpm. Exam Location:  Inpatient Procedure: 2D Echo, Cardiac Doppler and Color Doppler (Both Spectral and Color            Flow Doppler were utilized during procedure). Indications:    Atrial Fibrillation I48.91  History:        Patient has prior history of Echocardiogram examinations, most                 recent 05/11/2023. CAD, TIA, Arrythmias:Atrial Fibrillation;  Risk                 Factors:Hypertension and Dyslipidemia.  Sonographer:    Koleen Popper RDCS Referring Phys: [id number] JUSTIN B HOWERTER IMPRESSIONS  1. Left ventricular ejection fraction, by estimation, is 60 to 65%. The left ventricle has normal function. The left ventricle has no regional wall motion abnormalities. There is mild asymmetric left ventricular hypertrophy of the basal and septal segments. Left ventricular diastolic parameters are indeterminate.  2. Right ventricular systolic function is normal. The right ventricular size is normal. There is severely elevated pulmonary artery systolic pressure.  3. Left atrial size was moderately dilated.  4. The mitral valve is degenerative. Moderate to severe mitral valve regurgitation. No evidence of mitral stenosis. Severe mitral annular calcification.  5. Tricuspid valve regurgitation is moderate.  6. The aortic valve is tricuspid. There is moderate calcification of the aortic valve. There is moderate thickening of the aortic valve. Aortic valve regurgitation is mild. Moderate aortic valve stenosis.  7. The inferior vena cava is dilated in size with <50% respiratory variability, suggesting right atrial pressure of 15 mmHg. FINDINGS  Left Ventricle: Left ventricular ejection fraction, by estimation, is 60 to 65%. The left ventricle has normal function. The left ventricle has no regional wall motion abnormalities. Strain was performed and the global longitudinal strain is indeterminate. The left ventricular internal cavity size was normal in size. There is mild asymmetric left ventricular hypertrophy of the basal and septal segments. Left ventricular diastolic parameters are indeterminate. Right Ventricle: The right ventricular size is normal. No increase in right ventricular wall thickness. Right ventricular systolic function is normal. There is severely elevated pulmonary artery systolic pressure. The tricuspid regurgitant velocity is 3.71 m/s, and with an  assumed right atrial pressure of 15 mmHg, the estimated right ventricular systolic pressure is 70.1 mmHg. Left Atrium: Left atrial size was moderately dilated. Right Atrium: Right atrial size was normal in size. Pericardium: There is no evidence of pericardial effusion. Mitral Valve:  The mitral valve is degenerative in appearance. There is moderate thickening of the mitral valve leaflet(s). There is moderate calcification of the mitral valve leaflet(s). Severe mitral annular calcification. Moderate to severe mitral valve regurgitation. No evidence of mitral valve stenosis. Tricuspid Valve: The tricuspid valve is normal in structure. Tricuspid valve regurgitation is moderate . No evidence of tricuspid stenosis. Aortic Valve: The aortic valve is tricuspid. There is moderate calcification of the aortic valve. There is moderate thickening of the aortic valve. Aortic valve regurgitation is mild. Moderate aortic stenosis is present. Aortic valve mean gradient measures 17.7 mmHg. Aortic valve peak gradient measures 31.3 mmHg. Aortic valve area, by VTI measures 1.07 cm. Pulmonic Valve: The pulmonic valve was normal in structure. Pulmonic valve regurgitation is mild. No evidence of pulmonic stenosis. Aorta: The aortic root is normal in size and structure. Venous: The inferior vena cava is dilated in size with less than 50% respiratory variability, suggesting right atrial pressure of 15 mmHg. IAS/Shunts: No atrial level shunt detected by color flow Doppler. Additional Comments: 3D was performed not requiring image post processing on an independent workstation and was indeterminate.  LEFT VENTRICLE PLAX 2D LVIDd:         3.90 cm LVIDs:         3.00 cm LV PW:         1.00 cm LV IVS:        1.20 cm LVOT diam:     1.70 cm LV SV:         44 LV SV Index:   27 LVOT Area:     2.27 cm  LV Volumes (MOD) LV vol d, MOD A4C: 61.3 ml LV vol s, MOD A4C: 28.6 ml LV SV MOD A4C:     61.3 ml RIGHT VENTRICLE             IVC RV Basal diam:   3.50 cm     IVC diam: 2.40 cm RV S prime:     14.80 cm/s TAPSE (M-mode): 0.8 cm LEFT ATRIUM             Index        RIGHT ATRIUM           Index LA diam:        3.90 cm 2.37 cm/m   RA Area:     16.80 cm LA Vol (A2C):   51.6 ml 31.41 ml/m  RA Volume:   44.60 ml  27.15 ml/m LA Vol (A4C):   63.1 ml 38.41 ml/m LA Biplane Vol: 60.1 ml 36.58 ml/m  AORTIC VALVE AV Area (Vmax):    1.01 cm AV Area (Vmean):   0.99 cm AV Area (VTI):     1.07 cm AV Vmax:           279.67 cm/s AV Vmean:          196.333 cm/s AV VTI:            0.410 m AV Peak Grad:      31.3 mmHg AV Mean Grad:      17.7 mmHg LVOT Vmax:         124.00 cm/s LVOT Vmean:        85.300 cm/s LVOT VTI:          0.193 m LVOT/AV VTI ratio: 0.47  AORTA Ao Root diam: 2.80 cm Ao Asc diam:  3.00 cm TRICUSPID VALVE TR Peak grad:   55.1 mmHg TR Vmax:  371.00 cm/s  SHUNTS Systemic VTI:  0.19 m Systemic Diam: 1.70 cm Maude Emmer MD Electronically signed by Maude Emmer MD Signature Date/Time: 10/07/2023/12:47:28 PM    Final    CT Angio Chest Pulmonary Embolism (PE) W or WO Contrast Result Date: 10/07/2023 CLINICAL DATA:  Atrial fibrillation with shortness of breath and cough, initial encounter EXAM: CT ANGIOGRAPHY CHEST WITH CONTRAST TECHNIQUE: Multidetector CT imaging of the chest was performed using the standard protocol during bolus administration of intravenous contrast. Multiplanar CT image reconstructions and MIPs were obtained to evaluate the vascular anatomy. RADIATION DOSE REDUCTION: This exam was performed according to the departmental dose-optimization program which includes automated exposure control, adjustment of the mA and/or kV according to patient size and/or use of iterative reconstruction technique. CONTRAST:  75mL OMNIPAQUE  IOHEXOL  350 MG/ML SOLN COMPARISON:  Chest x-ray from earlier in the same day. FINDINGS: Cardiovascular: Atherosclerotic calcifications of the thoracic aorta are noted. No aneurysmal dilatation is seen. The degree of  opacification of the aorta is limited precluding evaluation for dissection. The pulmonary artery shows a normal branching pattern bilaterally. No filling defect to suggest pulmonary embolism is noted. Mediastinum/Nodes: Thoracic inlet shows mild heterogeneity of the thyroid  without discrete sizable nodule. No hilar or mediastinal adenopathy is noted. The esophagus as visualized is within normal limits. Lungs/Pleura: Small pleural effusions are noted bilaterally. Mild bibasilar atelectasis is seen. No focal confluent infiltrate is noted. The changes seen on recent plain film are not borne out on this exam. Mild changes of air trapping are noted. Parenchymal scarring is noted in the right upper lobe. Upper Abdomen: Visualized upper abdomen is within normal limits. Musculoskeletal: Degenerative changes of the thoracic spine are noted. No acute rib abnormality is noted. Review of the MIP images confirms the above findings. IMPRESSION: No evidence of pulmonary embolism. Increasing airspace opacity in the right upper lobe is not borne out on this exam. Small bilateral pleural effusions with basilar atelectasis. Aortic Atherosclerosis (ICD10-I70.0). Electronically Signed   By: Oneil Devonshire M.D.   On: 10/07/2023 01:19   DG Chest Port 1 View Result Date: 10/07/2023 CLINICAL DATA:  Shortness of breath EXAM: PORTABLE CHEST 1 VIEW COMPARISON:  10/06/2023 FINDINGS: Cardiac shadow is stable. Lungs are well aerated bilaterally. Some increasing airspace opacity in the right upper lobe is noted which may represent developing infiltrate. No sizable effusion is seen. No bony abnormality is noted. IMPRESSION: Increasing right upper lobe airspace opacity consistent with developing infiltrate. Electronically Signed   By: Oneil Devonshire M.D.   On: 10/07/2023 00:38   DG Chest Port 1 View Result Date: 10/06/2023 CLINICAL DATA:  Question sepsis. EXAM: PORTABLE CHEST 1 VIEW COMPARISON:  Chest radiograph dated 07/23/2023. FINDINGS: There  is diffuse chronic interstitial coarsening. Faint diffuse interstitial densities may represent chronic changes, edema, or atypical pneumonia. No focal consolidation, pleural effusion or pneumothorax. Stable cardiac silhouette. No acute osseous pathology. IMPRESSION: Chronic interstitial coarsening. No focal consolidation. Electronically Signed   By: Vanetta Chou M.D.   On: 10/06/2023 18:54      Subjective:   Discharge Exam: Vitals:   10/09/23 2110 10/10/23 0836  BP: (!) 107/54 (!) 114/90  Pulse: 62 (!) 128  Resp: 18 (!) 22  Temp: 98.1 F (36.7 C) 97.6 F (36.4 C)  SpO2:  93%    General: Pt is alert, awake, not in acute distress Cardiovascular: rate controlled, S1/S2 + Respiratory: bilateral decreased breath sounds at bases Abdominal: Soft, NT, ND, bowel sounds + Extremities: no edema, no cyanosis  The results of significant diagnostics from this hospitalization (including imaging, microbiology, ancillary and laboratory) are listed below for reference.     Microbiology: Recent Results (from the past 240 hours)  Resp panel by RT-PCR (RSV, Flu A&B, Covid) Anterior Nasal Swab     Status: None   Collection Time: 10/06/23  6:05 PM   Specimen: Anterior Nasal Swab  Result Value Ref Range Status   SARS Coronavirus 2 by RT PCR NEGATIVE NEGATIVE Final   Influenza A by PCR NEGATIVE NEGATIVE Final   Influenza B by PCR NEGATIVE NEGATIVE Final    Comment: (NOTE) The Xpert Xpress SARS-CoV-2/FLU/RSV plus assay is intended as an aid in the diagnosis of influenza from Nasopharyngeal swab specimens and should not be used as a sole basis for treatment. Nasal washings and aspirates are unacceptable for Xpert Xpress SARS-CoV-2/FLU/RSV testing.  Fact Sheet for Patients: BloggerCourse.com  Fact Sheet for Healthcare Providers: SeriousBroker.it  This test is not yet approved or cleared by the [place]  FDA and has been authorized  for detection and/or diagnosis of SARS-CoV-2 by FDA under an Emergency Use Authorization (EUA). This EUA will remain in effect (meaning this test can be used) for the duration of the COVID-19 declaration under Section 564(b)(1) of the Act, 21 U.S.C. section 360bbb-3(b)(1), unless the authorization is terminated or revoked.     Resp Syncytial Virus by PCR NEGATIVE NEGATIVE Final    Comment: (NOTE) Fact Sheet for Patients: BloggerCourse.com  Fact Sheet for Healthcare Providers: SeriousBroker.it  This test is not yet approved or cleared by the [place]  FDA and has been authorized for detection and/or diagnosis of SARS-CoV-2 by FDA under an Emergency Use Authorization (EUA). This EUA will remain in effect (meaning this test can be used) for the duration of the COVID-19 declaration under Section 564(b)(1) of the Act, 21 U.S.C. section 360bbb-3(b)(1), unless the authorization is terminated or revoked.  Performed at [facility], [address] [place], [place] [address]   Urine Culture     Status: None   Collection Time: 10/06/23  6:07 PM   Specimen: In/Out Cath Urine  Result Value Ref Range Status   Specimen Description IN/OUT CATH URINE  Final   Special Requests NONE  Final   Culture   Final    NO GROWTH Performed at [facility], [address], [place], [place] [address]    Report Status 10/07/2023 FINAL  Final  Blood Culture (routine x 2)     Status: None (Preliminary result)   Collection Time: 10/06/23  6:10 PM   Specimen: BLOOD  Result Value Ref Range Status   Specimen Description BLOOD LEFT ANTECUBITAL  Final   Special Requests   Final    BOTTLES DRAWN AEROBIC AND ANAEROBIC Blood Culture results may not be optimal due to an inadequate volume of blood received in culture bottles   Culture   Final    NO GROWTH 4 DAYS Performed at [facility], [address], [place], [place] [address]    Report  Status PENDING  Incomplete  Blood Culture (routine x 2)     Status: None (Preliminary result)   Collection Time: 10/06/23  6:30 PM   Specimen: BLOOD RIGHT FOREARM  Result Value Ref Range Status   Specimen Description BLOOD RIGHT FOREARM  Final   Special Requests   Final    BOTTLES DRAWN AEROBIC AND ANAEROBIC Blood Culture results may not be optimal due to an inadequate volume of blood received in  culture bottles   Culture   Final    NO GROWTH 4 DAYS Performed at [facility], [address], [place], [place] [address]    Report Status PENDING  Incomplete  MRSA Next Gen by PCR, Nasal     Status: None   Collection Time: 10/07/23  3:01 AM   Specimen: Nasal Mucosa; Nasal Swab  Result Value Ref Range Status   MRSA by PCR Next Gen NOT DETECTED NOT DETECTED Final    Comment: (NOTE) The GeneXpert MRSA Assay (FDA approved for NASAL specimens only), is one component of a comprehensive MRSA colonization surveillance program. It is not intended to diagnose MRSA infection nor to guide or monitor treatment for MRSA infections. Test performance is not FDA approved in patients less than 50 years old. Performed at [facility], [address], [place], [place] [address]      Labs: BNP (last 3 results) Recent Labs    10/06/23 1806 10/07/23 0332  BNP 762.4* 974.0*   Basic Metabolic Panel: Recent Labs  Lab 10/06/23 1805 10/07/23 0332 10/08/23 0927 10/09/23 1210  NA 130* 131* 133* 129*  K 4.5 4.4 3.9 3.4*  CL 98 100 98 91*  CO2 21* 21* 20* 20*  GLUCOSE 109* 118* 124* 298*  BUN 24* 23 25* 27*  CREATININE 1.40* 1.45* 1.71* 1.92*  CALCIUM  9.1 9.0 8.9 8.3*  MG 1.8 1.6*  --   --    Liver Function Tests: Recent Labs  Lab 10/06/23 1805 10/07/23 0332  AST 21 24  ALT 16 18  ALKPHOS 73 79  BILITOT 1.2 1.6*  PROT 6.6 6.2*  ALBUMIN 4.0 3.8   No results for input(s): LIPASE, AMYLASE in the last 168 hours. No results for input(s): AMMONIA in the last 168  hours. CBC: Recent Labs  Lab 10/06/23 1805 10/07/23 0332 10/08/23 0927  WBC 10.8* 10.0 7.7  NEUTROABS 9.4* 8.9*  --   HGB 10.1* 9.5* 9.4*  HCT 32.0* 29.2* 28.8*  MCV 92.0 90.1 89.7  PLT 156 135* 128*   Cardiac Enzymes: No results for input(s): CKTOTAL, CKMB, CKMBINDEX, TROPONINI in the last 168 hours. BNP: Invalid input(s): POCBNP CBG: Recent Labs  Lab 10/09/23 1742  GLUCAP 131*   D-Dimer No results for input(s): DDIMER in the last 72 hours. Hgb A1c No results for input(s): HGBA1C in the last 72 hours. Lipid Profile No results for input(s): CHOL, HDL, LDLCALC, TRIG, CHOLHDL, LDLDIRECT in the last 72 hours. Thyroid  function studies No results for input(s): TSH, T4TOTAL, T3FREE, THYROIDAB in the last 72 hours.  Invalid input(s): FREET3 Anemia work up No results for input(s): VITAMINB12, FOLATE, FERRITIN, TIBC, IRON , RETICCTPCT in the last 72 hours. Urinalysis    Component Value Date/Time   COLORURINE YELLOW 10/06/2023 1807   APPEARANCEUR CLEAR 10/06/2023 1807   LABSPEC 1.013 10/06/2023 1807   PHURINE 5.0 10/06/2023 1807   GLUCOSEU NEGATIVE 10/06/2023 1807   HGBUR NEGATIVE 10/06/2023 1807   BILIRUBINUR NEGATIVE 10/06/2023 1807   KETONESUR NEGATIVE 10/06/2023 1807   PROTEINUR NEGATIVE 10/06/2023 1807   NITRITE NEGATIVE 10/06/2023 1807   LEUKOCYTESUR NEGATIVE 10/06/2023 1807   Sepsis Labs Recent Labs  Lab 10/06/23 1805 10/07/23 0332 10/08/23 0927  WBC 10.8* 10.0 7.7   Microbiology Recent Results (from the past 240 hours)  Resp panel by RT-PCR (RSV, Flu A&B, Covid) Anterior Nasal Swab     Status: None   Collection Time: 10/06/23  6:05 PM   Specimen: Anterior Nasal Swab  Result Value  Ref Range Status   SARS Coronavirus 2 by RT PCR NEGATIVE NEGATIVE Final   Influenza A by PCR NEGATIVE NEGATIVE Final   Influenza B by PCR NEGATIVE NEGATIVE Final    Comment: (NOTE) The Xpert Xpress SARS-CoV-2/FLU/RSV plus assay  is intended as an aid in the diagnosis of influenza from Nasopharyngeal swab specimens and should not be used as a sole basis for treatment. Nasal washings and aspirates are unacceptable for Xpert Xpress SARS-CoV-2/FLU/RSV testing.  Fact Sheet for Patients: BloggerCourse.com  Fact Sheet for Healthcare Providers: SeriousBroker.it  This test is not yet approved or cleared by the [place]  FDA and has been authorized for detection and/or diagnosis of SARS-CoV-2 by FDA under an Emergency Use Authorization (EUA). This EUA will remain in effect (meaning this test can be used) for the duration of the COVID-19 declaration under Section 564(b)(1) of the Act, 21 U.S.C. section 360bbb-3(b)(1), unless the authorization is terminated or revoked.     Resp Syncytial Virus by PCR NEGATIVE NEGATIVE Final    Comment: (NOTE) Fact Sheet for Patients: BloggerCourse.com  Fact Sheet for Healthcare Providers: SeriousBroker.it  This test is not yet approved or cleared by the [place]  FDA and has been authorized for detection and/or diagnosis of SARS-CoV-2 by FDA under an Emergency Use Authorization (EUA). This EUA will remain in effect (meaning this test can be used) for the duration of the COVID-19 declaration under Section 564(b)(1) of the Act, 21 U.S.C. section 360bbb-3(b)(1), unless the authorization is terminated or revoked.  Performed at [facility], [address] [place], [place] [address]   Urine Culture     Status: None   Collection Time: 10/06/23  6:07 PM   Specimen: In/Out Cath Urine  Result Value Ref Range Status   Specimen Description IN/OUT CATH URINE  Final   Special Requests NONE  Final   Culture   Final    NO GROWTH Performed at [facility], [address], [place], [place] [address]    Report Status 10/07/2023 FINAL  Final  Blood Culture (routine x 2)      Status: None (Preliminary result)   Collection Time: 10/06/23  6:10 PM   Specimen: BLOOD  Result Value Ref Range Status   Specimen Description BLOOD LEFT ANTECUBITAL  Final   Special Requests   Final    BOTTLES DRAWN AEROBIC AND ANAEROBIC Blood Culture results may not be optimal due to an inadequate volume of blood received in culture bottles   Culture   Final    NO GROWTH 4 DAYS Performed at [facility], [address] [place], [place] [address]    Report Status PENDING  Incomplete  Blood Culture (routine x 2)     Status: None (Preliminary result)   Collection Time: 10/06/23  6:30 PM   Specimen: BLOOD RIGHT FOREARM  Result Value Ref Range Status   Specimen Description BLOOD RIGHT FOREARM  Final   Special Requests   Final    BOTTLES DRAWN AEROBIC AND ANAEROBIC Blood Culture results may not be optimal due to an inadequate volume of blood received in culture bottles   Culture   Final    NO GROWTH 4 DAYS Performed at [facility], [address], [place], [place] [address]    Report Status PENDING  Incomplete  MRSA Next Gen by PCR, Nasal     Status: None   Collection Time: 10/07/23  3:01 AM   Specimen: Nasal Mucosa; Nasal Swab  Result  Value Ref Range Status   MRSA by PCR Next Gen NOT DETECTED NOT DETECTED Final    Comment: (NOTE) The GeneXpert MRSA Assay (FDA approved for NASAL specimens only), is one component of a comprehensive MRSA colonization surveillance program. It is not intended to diagnose MRSA infection nor to guide or monitor treatment for MRSA infections. Test performance is not FDA approved in patients less than 32 years old. Performed at [facility], [address] [place], [place] [address]      Time coordinating discharge: 35 minutes  SIGNED:   Derryl Duval, MD  [facility] 10/10/2023, 2:20 PM

## 2023-10-10 NOTE — Progress Notes (Signed)
 [facility] Liaison Note  Received request from [facility] manager for family interest in [facility]. Eligibility confirmed. Met with patient and family to confirm interest and explain services. Family agreeable to transfer today. TOC aware. RN please call report to [phone] prior to patient leaving the unit. Please send signed DNR with patient at discharge.   Thank you for allowing us  to participate in this patient's care.  Eleanor Nail, LPN [facility] Liaison [phone]

## 2023-10-10 NOTE — Progress Notes (Addendum)
 Daily Progress Note   Patient Name: Sheri Hensley       Date: 10/10/2023 DOB: 27-Aug-1919  Age: 89 y.o. MRN#: [id number] Attending Physician: Mcarthur Pick, MD Primary Care Physician: Charlott Dorn LABOR, MD Admit Date: 10/06/2023  Reason for Consultation/Follow-up: Non pain symptom management, Pain control, Psychosocial/spiritual support, and Terminal Care  Subjective: I have reviewed medical records including EPIC notes and MAR. PRNs given in the last 24 hours: tylenol . Unable to receive report from primary RN.  Went to visit patient at bedside - daughter/Vicki and granddaughter/Tracy present. Patient was lying in bed - I did not attempt to wake her to preserve comfort; though she did not wake for duration of visit and talking to family in the room. No signs or non-verbal gestures of pain or discomfort noted. No respiratory distress, increased work of breathing, or secretions noted. She appears to have declined since yesterday.  Emotional support provided to family. Gave update, per documentation, patient did not qualify for [facility] per [facility]'s assessment yesterday. Discussed patient's decline -family wondered if patient was mad at them, as now she is less interactive. Education provided on natural trajectory at EOL and how it is common someone becomes withdrawn - patient is likely not mad. We discussed transitional phase of dying vs active phase per their request. Family report patient has only had one sip of coffee this morning, otherwise is not interested in oral intake - validated this is also normal trajectory for EOL and they expressed understanding.  Gone from [employer] book provided.   Family would be open to [facility] re-evaluation today in context of patient's  ongoing, quick decline.  Family provide updates that patient is having a hard time swallowing pills regardless of being crushed, whole, with/without applesauce - they are ok with discontinuing metoprolol  and managing any negative symptoms with comfort medications.  All questions and concerns addressed. Encouraged to call with questions and/or concerns. PMT card previously provided.  Requested hospice liaison re-evaluate for [facility] - they will come see patient today.  Length of Stay: 4  Current Medications: Scheduled Meds:   antiseptic oral rinse  15 mL Topical BID   furosemide   40 mg Intravenous BID   metoprolol  tartrate  50 mg Oral BID    Continuous Infusions:   PRN Meds: acetaminophen  **OR** acetaminophen , artificial  tears, diphenhydrAMINE , glycopyrrolate  **OR** glycopyrrolate  **OR** glycopyrrolate , haloperidol  **OR** haloperidol  **OR** haloperidol  lactate, HYDROmorphone  (DILAUDID ) injection, levalbuterol , LORazepam  **OR** LORazepam  **OR** LORazepam , melatonin, ondansetron  **OR** ondansetron  (ZOFRAN ) IV, oxyCODONE   Physical Exam Vitals and nursing note reviewed.  Constitutional:      General: She is not in acute distress. Pulmonary:     Effort: No respiratory distress.  Skin:    General: Skin is warm and dry.  Neurological:     Mental Status: She is lethargic.     Motor: Weakness present.             Vital Signs: BP (!) 114/90 (BP Location: Left Arm)   Pulse (!) 128   Temp 97.6 F (36.4 C) (Axillary)   Resp (!) 22   Ht 5' 5 (1.651 m)   Wt 58.2 kg   LMP  (LMP Unknown)   SpO2 93%   BMI 21.35 kg/m  SpO2: SpO2: 93 % O2 Device: O2 Device: Room Air O2 Flow Rate:    Intake/output summary:  Intake/Output Summary (Last 24 hours) at 10/10/2023 1017 Last data filed at 10/10/2023 0946 Gross per 24 hour  Intake 120 ml  Output 400 ml  Net -280 ml   LBM: Last BM Date : 10/06/23 Baseline Weight: Weight: 55.8 kg Most recent weight: Weight: 58.2 kg        Palliative Assessment/Data: PPS 10-20%      Patient Active Problem List   Diagnosis Date Noted   Palliative care encounter 10/08/2023   Atrial fibrillation with RVR (HCC) 10/06/2023   Generalized weakness 10/06/2023   History of essential hypertension 10/06/2023   Anemia of chronic disease 10/06/2023   Insomnia 04/28/2021   Depression 04/28/2021   Abnormal radiologic findings on diagnostic imaging of right testicle 02/23/2021   History of cancer of uterine body 02/23/2021   AKI (acute kidney injury) (HCC) 08/29/2020   AF (paroxysmal atrial fibrillation) (HCC) 08/29/2020   Acute hyponatremia 08/29/2020   Closed right hip fracture (HCC) 08/28/2020   Acquired absence of both cervix and uterus 03/11/2020   Acquired thrombophilia (HCC) 03/11/2020   Chronic kidney disease due to hypertension 03/11/2020   Constipation 03/11/2020   Frailty 03/11/2020   Herpes zoster without complication 03/11/2020   Overactive bladder 03/11/2020   Standard chest x-ray abnormal 03/11/2020   Type 2 diabetes mellitus with other specified complication (HCC) 03/11/2020   Vitamin D  deficiency 03/11/2020   Atrial fibrillation (HCC)    Bruises easily    Coronary artery disease    GERD (gastroesophageal reflux disease)    Glaucoma, both eyes    History of breast cancer    History of Clostridium difficile infection    HLD (hyperlipidemia)    Hypertension    Macular degeneration    Narrow complex tachycardia (HCC)    OA (osteoarthritis)    PMB (postmenopausal bleeding)    PONV (postoperative nausea and vomiting)    Wears glasses    Acute respiratory failure with hypoxia (HCC) 01/08/2019   COVID-19 virus infection 01/08/2019   Pneumonia due to COVID-19 virus 01/08/2019   Falls 06/2017   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   Paroxysmal atrial fibrillation (HCC) 12/23/2015   Hyponatremia 12/23/2015   Elevated bilirubin 12/23/2015   TIA (transient ischemic attack) 12/22/2015    Personal history of transient ischemic attack (TIA), and cerebral infarction without residual deficits 12/22/2015   Endometrial cancer (HCC) 11/21/2015   Postmenopausal bleeding 11/10/2015   Thickened endometrium 11/10/2015   Essential (primary) hypertension 10/24/2014  Essential hypertension 10/24/2014   Post-traumatic osteoarthritis of both knees 10/15/2013   Cancer (HCC) 1998    Palliative Care Assessment & Plan   Patient Profile: YARELIN REICHARDT is a 89 y.o. female with multiple medical problems including A-fib not on anticoagulation, diastolic dysfunction, moderate to severe valvular heart disease, anemia of chronic disease, who was admitted the hospital on 10/06/2023 with A-fib with RVR. Palliative care was consulted to address goals.   Assessment: Principal Problem:   Atrial fibrillation with RVR (HCC) Active Problems:   HLD (hyperlipidemia)   Acute hyponatremia   Depression   Generalized weakness   History of essential hypertension   Anemia of chronic disease   Palliative care encounter   Terminal care  Recommendations/Plan: Continue full comfort measures Continue DNR-comfort as previously documented  Requested [facility] re-evaluate for [employer] in context of further decline from yesterday - they will see patient today Adjusted comfort focused mediation regimen as noted below  PMT will continue to follow and support holistically  Symptom Management Dilaudid  PRN pain/dyspnea/increased work of breathing/RR>25 Oxycodone  PRN moderate pain Tylenol  PRN pain/fever Biotin twice daily Benadryl  PRN itching Robinul  PRN secretions Haldol  PRN agitation/delirium Ativan  PRN anxiety/seizure/sleep/distress Zofran  PRN nausea/vomiting Liquifilm Tears PRN dry eye Continue lasix  comfort Discontinued metoprolol  - not tolerating POs  Goals of Care and Additional Recommendations: Limitations on Scope of Treatment: Full Comfort Care  Code Status:    Code Status  Orders  (From admission, onward)           Start     Ordered   10/09/23 1355  Do not attempt resuscitation (DNR) - Comfort care  Continuous       Question Answer Comment  If patient has no pulse and is not breathing Do Not Attempt Resuscitation   In Pre-Arrest Conditions (Patient Is Breathing and Has a Pulse) Provide comfort measures. Relieve any mechanical airway obstruction. Avoid transfer unless required for comfort.   Consent: Discussion documented in EHR or advanced directives reviewed      10/09/23 1357           Code Status History     Date Active Date Inactive Code Status Order ID Comments User Context   10/07/2023 1711 10/09/2023 1357 Limited: Do not attempt resuscitation (DNR) -DNR-LIMITED -Do Not Intubate/DNI  [id number]  Barbaraann Darryle Ned, MD Inpatient   10/06/2023 2036 10/07/2023 1711 Full Code [id number]  Howerter, Eva NOVAK, DO ED   08/29/2020 0022 09/05/2020 1931 Full Code [id number]  Chotiner, Adine RAMAN, MD ED   01/10/2019 1744 01/18/2019 1949 DNR [id number]  Davia Nydia POUR, MD Inpatient   01/08/2019 0856 01/10/2019 1744 DNR [id number]  Sherrill Alejandro Donovan, DO Inpatient   01/27/2016 1256 01/28/2016 1626 Full Code [id number]  Eloy Herring, MD Inpatient   12/23/2015 0321 12/23/2015 2105 DNR [id number]  Jonel Lonni SQUIBB, MD ED       Prognosis:  < 2 weeks  Discharge Planning: Hospice facility  Care plan was discussed with primary RN, patient's family, hospice liaison, TOC, Dr. Mcarthur  Thank you for allowing the Palliative Medicine Team to assist in the care of this patient.     Jeoffrey CHRISTELLA Sharps, NP  Please contact Palliative Medicine Team phone at [phone] for questions and concerns.   *Portions of this note are a verbal dictation therefore any spelling and/or grammatical errors are due to the Dragon [employer] One system interpretation.

## 2023-10-10 NOTE — TOC Transition Note (Signed)
 Transition of Care [facility]) - Discharge Note   Patient Details  Name: Sheri Hensley MRN: [id number] Date of Birth: 1919-12-02  [facility]) CM/SW Contact:  Almarie CHRISTELLA Goodie, LCSW Phone Number: 10/10/2023, 2:56 PM   Clinical Narrative:   CSW notified by [facility] that patient is approved for [facility] and bed is available, family has completed consent paperwork. Transport arranged with [facility] for next available.  Nurse to call report to [phone]    Final next level of care: Hospice Medical Facility Barriers to Discharge: Barriers Resolved   Patient Goals and CMS Choice            Discharge Placement              Patient chooses bed at:  [facility]) Patient to be transferred to facility by: [facility]   Patient and family notified of of transfer: 10/10/23  Discharge Plan and Services Additional resources added to the After Visit Summary for                                       Social Drivers of Health (SDOH) Interventions SDOH Screenings   Food Insecurity: No Food Insecurity (10/07/2023)  Housing: Low Risk  (10/07/2023)  Transportation Needs: No Transportation Needs (10/07/2023)  Utilities: Not At Risk (10/07/2023)  Social Connections: Moderately Isolated (10/07/2023)  Tobacco Use: Low Risk  (10/06/2023)     Readmission Risk Interventions     No data to display

## 2023-10-11 LAB — CULTURE, BLOOD (ROUTINE X 2)
Culture: NO GROWTH
Culture: NO GROWTH

## 2023-11-19 DEATH — deceased

## 2023-12-02 ENCOUNTER — Ambulatory Visit: Admitting: Obstetrics
